# Patient Record
Sex: Female | Born: 1955
Health system: Southern US, Community
[De-identification: ages and names within clinical notes are randomized; demographics above are authoritative.]

## PROBLEM LIST (undated history)

## (undated) DIAGNOSIS — T4145XA Adverse effect of unspecified anesthetic, initial encounter: Secondary | ICD-10-CM

## (undated) DIAGNOSIS — J309 Allergic rhinitis, unspecified: Secondary | ICD-10-CM

## (undated) DIAGNOSIS — F411 Generalized anxiety disorder: Secondary | ICD-10-CM

## (undated) DIAGNOSIS — E785 Hyperlipidemia, unspecified: Secondary | ICD-10-CM

## (undated) DIAGNOSIS — L03119 Cellulitis of unspecified part of limb: Secondary | ICD-10-CM

## (undated) DIAGNOSIS — F429 Obsessive-compulsive disorder, unspecified: Secondary | ICD-10-CM

## (undated) DIAGNOSIS — D499 Neoplasm of unspecified behavior of unspecified site: Secondary | ICD-10-CM

## (undated) DIAGNOSIS — M1990 Unspecified osteoarthritis, unspecified site: Secondary | ICD-10-CM

## (undated) DIAGNOSIS — Z85528 Personal history of other malignant neoplasm of kidney: Secondary | ICD-10-CM

## (undated) DIAGNOSIS — N289 Disorder of kidney and ureter, unspecified: Secondary | ICD-10-CM

## (undated) DIAGNOSIS — F32A Depression, unspecified: Secondary | ICD-10-CM

## (undated) DIAGNOSIS — R079 Chest pain, unspecified: Secondary | ICD-10-CM

## (undated) DIAGNOSIS — F329 Major depressive disorder, single episode, unspecified: Secondary | ICD-10-CM

## (undated) DIAGNOSIS — N926 Irregular menstruation, unspecified: Secondary | ICD-10-CM

## (undated) DIAGNOSIS — M84373A Stress fracture, unspecified ankle, initial encounter for fracture: Secondary | ICD-10-CM

## (undated) DIAGNOSIS — T7840XA Allergy, unspecified, initial encounter: Secondary | ICD-10-CM

## (undated) DIAGNOSIS — I471 Supraventricular tachycardia: Secondary | ICD-10-CM

## (undated) DIAGNOSIS — M79609 Pain in unspecified limb: Secondary | ICD-10-CM

## (undated) DIAGNOSIS — C801 Malignant (primary) neoplasm, unspecified: Secondary | ICD-10-CM

## (undated) DIAGNOSIS — G510 Bell's palsy: Secondary | ICD-10-CM

## (undated) DIAGNOSIS — L02419 Cutaneous abscess of limb, unspecified: Secondary | ICD-10-CM

## (undated) DIAGNOSIS — L84 Corns and callosities: Secondary | ICD-10-CM

## (undated) HISTORY — DX: Allergic rhinitis, unspecified: J30.9

## (undated) HISTORY — DX: Cellulitis of unspecified part of limb: L03.119

## (undated) HISTORY — PX: WISDOM TOOTH EXTRACTION: SHX21

## (undated) HISTORY — DX: Allergy, unspecified, initial encounter: T78.40XA

## (undated) HISTORY — DX: Depression, unspecified: F32.A

## (undated) HISTORY — DX: Bell's palsy: G51.0

## (undated) HISTORY — DX: Major depressive disorder, single episode, unspecified: F32.9

## (undated) HISTORY — DX: Irregular menstruation, unspecified: N92.6

## (undated) HISTORY — DX: Unspecified osteoarthritis, unspecified site: M19.90

## (undated) HISTORY — DX: Generalized anxiety disorder: F41.1

## (undated) HISTORY — DX: Supraventricular tachycardia: I47.1

## (undated) HISTORY — DX: Corns and callosities: L84

## (undated) HISTORY — DX: Obsessive-compulsive disorder, unspecified: F42.9

## (undated) HISTORY — DX: Hyperlipidemia, unspecified: E78.5

## (undated) HISTORY — DX: Cutaneous abscess of limb, unspecified: L02.419

## (undated) HISTORY — DX: Pain in unspecified limb: M79.609

## (undated) HISTORY — DX: Chest pain, unspecified: R07.9

## (undated) HISTORY — PX: APPENDECTOMY: SHX54

---

## 1998-11-03 ENCOUNTER — Other Ambulatory Visit: Admission: RE | Admit: 1998-11-03 | Discharge: 1998-11-03 | Payer: Self-pay | Admitting: Family Medicine

## 2000-03-08 ENCOUNTER — Other Ambulatory Visit: Admission: RE | Admit: 2000-03-08 | Discharge: 2000-03-08 | Payer: Self-pay | Admitting: Family Medicine

## 2001-03-24 ENCOUNTER — Other Ambulatory Visit: Admission: RE | Admit: 2001-03-24 | Discharge: 2001-03-24 | Payer: Self-pay | Admitting: Family Medicine

## 2002-04-13 ENCOUNTER — Other Ambulatory Visit: Admission: RE | Admit: 2002-04-13 | Discharge: 2002-04-13 | Payer: Self-pay | Admitting: Family Medicine

## 2003-04-24 ENCOUNTER — Other Ambulatory Visit: Admission: RE | Admit: 2003-04-24 | Discharge: 2003-04-24 | Payer: Self-pay | Admitting: Family Medicine

## 2003-05-22 ENCOUNTER — Ambulatory Visit (HOSPITAL_COMMUNITY): Admission: RE | Admit: 2003-05-22 | Discharge: 2003-05-22 | Payer: Self-pay | Admitting: Internal Medicine

## 2004-01-20 ENCOUNTER — Ambulatory Visit: Payer: Self-pay | Admitting: Family Medicine

## 2004-04-16 ENCOUNTER — Ambulatory Visit: Payer: Self-pay | Admitting: Family Medicine

## 2004-04-23 ENCOUNTER — Ambulatory Visit: Payer: Self-pay | Admitting: Family Medicine

## 2004-04-23 ENCOUNTER — Other Ambulatory Visit: Admission: RE | Admit: 2004-04-23 | Discharge: 2004-04-23 | Payer: Self-pay | Admitting: Family Medicine

## 2004-09-04 ENCOUNTER — Ambulatory Visit: Payer: Self-pay | Admitting: Family Medicine

## 2005-04-27 ENCOUNTER — Ambulatory Visit: Payer: Self-pay | Admitting: Family Medicine

## 2005-05-10 ENCOUNTER — Ambulatory Visit: Payer: Self-pay | Admitting: Family Medicine

## 2005-05-10 ENCOUNTER — Encounter: Payer: Self-pay | Admitting: Family Medicine

## 2005-05-10 ENCOUNTER — Other Ambulatory Visit: Admission: RE | Admit: 2005-05-10 | Discharge: 2005-05-10 | Payer: Self-pay | Admitting: Family Medicine

## 2005-07-06 ENCOUNTER — Ambulatory Visit: Payer: Self-pay | Admitting: Family Medicine

## 2005-12-14 ENCOUNTER — Ambulatory Visit: Payer: Self-pay | Admitting: Family Medicine

## 2005-12-14 ENCOUNTER — Ambulatory Visit: Payer: Self-pay | Admitting: Cardiology

## 2005-12-14 ENCOUNTER — Observation Stay (HOSPITAL_COMMUNITY): Admission: EM | Admit: 2005-12-14 | Discharge: 2005-12-15 | Payer: Self-pay | Admitting: Emergency Medicine

## 2005-12-27 ENCOUNTER — Encounter: Payer: Self-pay | Admitting: Cardiology

## 2005-12-27 ENCOUNTER — Ambulatory Visit: Payer: Self-pay

## 2006-04-25 ENCOUNTER — Ambulatory Visit: Payer: Self-pay | Admitting: Family Medicine

## 2006-04-25 LAB — CONVERTED CEMR LAB
Albumin: 3.8 g/dL (ref 3.5–5.2)
Alkaline Phosphatase: 35 units/L — ABNORMAL LOW (ref 39–117)
CO2: 30 meq/L (ref 19–32)
Chloride: 109 meq/L (ref 96–112)
Cholesterol: 215 mg/dL (ref 0–200)
Creatinine, Ser: 0.8 mg/dL (ref 0.4–1.2)
GFR calc Af Amer: 98 mL/min
GFR calc non Af Amer: 81 mL/min
Glucose, Bld: 80 mg/dL (ref 70–99)
HCT: 38.8 % (ref 36.0–46.0)
Hemoglobin: 13.9 g/dL (ref 12.0–15.0)
Lymphocytes Relative: 27.2 % (ref 12.0–46.0)
MCHC: 35.9 g/dL (ref 30.0–36.0)
Monocytes Relative: 8 % (ref 3.0–11.0)
Neutrophils Relative %: 61 % (ref 43.0–77.0)
Platelets: 248 10*3/uL (ref 150–400)
Potassium: 4.7 meq/L (ref 3.5–5.1)
RDW: 12.2 % (ref 11.5–14.6)
Sodium: 145 meq/L (ref 135–145)
Total Bilirubin: 0.9 mg/dL (ref 0.3–1.2)
Total Protein: 6.4 g/dL (ref 6.0–8.3)

## 2006-05-31 ENCOUNTER — Encounter: Payer: Self-pay | Admitting: Family Medicine

## 2006-05-31 ENCOUNTER — Other Ambulatory Visit: Admission: RE | Admit: 2006-05-31 | Discharge: 2006-05-31 | Payer: Self-pay | Admitting: Family Medicine

## 2006-05-31 ENCOUNTER — Ambulatory Visit: Payer: Self-pay | Admitting: Family Medicine

## 2007-01-24 ENCOUNTER — Telehealth: Payer: Self-pay | Admitting: Family Medicine

## 2007-02-02 HISTORY — PX: COLONOSCOPY: SHX174

## 2007-03-24 ENCOUNTER — Encounter: Payer: Self-pay | Admitting: Family Medicine

## 2007-03-28 ENCOUNTER — Encounter: Payer: Self-pay | Admitting: Family Medicine

## 2007-04-05 ENCOUNTER — Ambulatory Visit: Payer: Self-pay | Admitting: Family Medicine

## 2007-04-05 DIAGNOSIS — J069 Acute upper respiratory infection, unspecified: Secondary | ICD-10-CM | POA: Insufficient documentation

## 2007-05-26 ENCOUNTER — Ambulatory Visit: Payer: Self-pay | Admitting: Family Medicine

## 2007-05-26 LAB — CONVERTED CEMR LAB
Alkaline Phosphatase: 36 units/L — ABNORMAL LOW (ref 39–117)
Basophils Absolute: 0 10*3/uL (ref 0.0–0.1)
Bilirubin Urine: NEGATIVE
CO2: 31 meq/L (ref 19–32)
Calcium: 9.1 mg/dL (ref 8.4–10.5)
Direct LDL: 124.1 mg/dL
Eosinophils Absolute: 0.2 10*3/uL (ref 0.0–0.7)
GFR calc non Af Amer: 70 mL/min
Glucose, Bld: 76 mg/dL (ref 70–99)
Glucose, Urine, Semiquant: NEGATIVE
HCT: 37.5 % (ref 36.0–46.0)
MCV: 91 fL (ref 78.0–100.0)
Monocytes Absolute: 0.5 10*3/uL (ref 0.1–1.0)
Monocytes Relative: 9.6 % (ref 3.0–12.0)
Neutrophils Relative %: 53.5 % (ref 43.0–77.0)
Protein, U semiquant: NEGATIVE
RBC: 4.12 M/uL (ref 3.87–5.11)
RDW: 12.8 % (ref 11.5–14.6)
Specific Gravity, Urine: 1.015
Total Bilirubin: 1 mg/dL (ref 0.3–1.2)
Total Protein: 6.7 g/dL (ref 6.0–8.3)
Urobilinogen, UA: 0.2
VLDL: 7 mg/dL (ref 0–40)
pH: 7.5

## 2007-06-02 ENCOUNTER — Ambulatory Visit: Payer: Self-pay | Admitting: Family Medicine

## 2007-06-02 ENCOUNTER — Other Ambulatory Visit: Admission: RE | Admit: 2007-06-02 | Discharge: 2007-06-02 | Payer: Self-pay | Admitting: Family Medicine

## 2007-06-02 ENCOUNTER — Encounter: Payer: Self-pay | Admitting: Family Medicine

## 2007-06-02 DIAGNOSIS — F411 Generalized anxiety disorder: Secondary | ICD-10-CM | POA: Insufficient documentation

## 2007-06-02 DIAGNOSIS — I471 Supraventricular tachycardia, unspecified: Secondary | ICD-10-CM

## 2007-06-02 HISTORY — DX: Supraventricular tachycardia, unspecified: I47.10

## 2007-06-02 HISTORY — DX: Generalized anxiety disorder: F41.1

## 2007-06-02 HISTORY — DX: Supraventricular tachycardia: I47.1

## 2007-06-19 ENCOUNTER — Ambulatory Visit: Payer: Self-pay | Admitting: Gastroenterology

## 2007-07-03 ENCOUNTER — Ambulatory Visit: Payer: Self-pay | Admitting: Gastroenterology

## 2007-11-07 ENCOUNTER — Ambulatory Visit: Payer: Self-pay | Admitting: Family Medicine

## 2008-03-25 ENCOUNTER — Encounter: Payer: Self-pay | Admitting: Family Medicine

## 2008-05-27 ENCOUNTER — Telehealth: Payer: Self-pay | Admitting: Family Medicine

## 2008-05-27 ENCOUNTER — Ambulatory Visit: Payer: Self-pay | Admitting: Family Medicine

## 2008-05-27 LAB — CONVERTED CEMR LAB
ALT: 15 units/L (ref 0–35)
AST: 15 units/L (ref 0–37)
BUN: 14 mg/dL (ref 6–23)
Basophils Absolute: 0 10*3/uL (ref 0.0–0.1)
Blood in Urine, dipstick: NEGATIVE
Creatinine, Ser: 0.8 mg/dL (ref 0.4–1.2)
Eosinophils Absolute: 0.4 10*3/uL (ref 0.0–0.7)
GFR calc non Af Amer: 80.09 mL/min (ref >60)
Glucose, Urine, Semiquant: NEGATIVE
HCT: 37.1 % (ref 36.0–46.0)
MCHC: 35.1 g/dL (ref 30.0–36.0)
MCV: 92 fL (ref 78.0–100.0)
Nitrite: NEGATIVE
RBC: 4.03 M/uL (ref 3.87–5.11)
Specific Gravity, Urine: 1.02
TSH: 1.57 microintl units/mL (ref 0.35–5.50)
Total CHOL/HDL Ratio: 3
Total Protein: 6.6 g/dL (ref 6.0–8.3)
Urobilinogen, UA: 0.2
VLDL: 6.2 mg/dL (ref 0.0–40.0)
WBC: 4.7 10*3/uL (ref 4.5–10.5)

## 2008-06-03 ENCOUNTER — Ambulatory Visit: Payer: Self-pay | Admitting: Family Medicine

## 2008-06-03 DIAGNOSIS — N63 Unspecified lump in unspecified breast: Secondary | ICD-10-CM | POA: Insufficient documentation

## 2008-06-03 DIAGNOSIS — N926 Irregular menstruation, unspecified: Secondary | ICD-10-CM

## 2008-06-03 HISTORY — DX: Irregular menstruation, unspecified: N92.6

## 2008-06-10 ENCOUNTER — Encounter: Payer: Self-pay | Admitting: Family Medicine

## 2008-06-11 ENCOUNTER — Telehealth: Payer: Self-pay | Admitting: Family Medicine

## 2008-07-03 ENCOUNTER — Ambulatory Visit: Payer: Self-pay | Admitting: Family Medicine

## 2008-07-03 DIAGNOSIS — J309 Allergic rhinitis, unspecified: Secondary | ICD-10-CM | POA: Insufficient documentation

## 2008-07-03 DIAGNOSIS — L84 Corns and callosities: Secondary | ICD-10-CM

## 2008-07-03 HISTORY — DX: Allergic rhinitis, unspecified: J30.9

## 2008-07-03 HISTORY — DX: Corns and callosities: L84

## 2008-09-14 ENCOUNTER — Ambulatory Visit: Payer: Self-pay | Admitting: Family Medicine

## 2008-09-14 DIAGNOSIS — L02419 Cutaneous abscess of limb, unspecified: Secondary | ICD-10-CM

## 2008-09-14 DIAGNOSIS — L03119 Cellulitis of unspecified part of limb: Secondary | ICD-10-CM

## 2008-09-14 HISTORY — DX: Cutaneous abscess of limb, unspecified: L02.419

## 2008-09-19 ENCOUNTER — Telehealth: Payer: Self-pay | Admitting: *Deleted

## 2008-09-20 ENCOUNTER — Ambulatory Visit: Payer: Self-pay | Admitting: Family Medicine

## 2008-12-02 ENCOUNTER — Ambulatory Visit: Payer: Self-pay | Admitting: Family Medicine

## 2008-12-02 DIAGNOSIS — G510 Bell's palsy: Secondary | ICD-10-CM

## 2008-12-02 DIAGNOSIS — Z8669 Personal history of other diseases of the nervous system and sense organs: Secondary | ICD-10-CM | POA: Insufficient documentation

## 2008-12-02 HISTORY — DX: Bell's palsy: G51.0

## 2009-01-14 ENCOUNTER — Telehealth: Payer: Self-pay | Admitting: Family Medicine

## 2009-04-01 LAB — HM MAMMOGRAPHY: HM Mammogram: NORMAL

## 2009-04-07 ENCOUNTER — Encounter: Payer: Self-pay | Admitting: Family Medicine

## 2009-04-09 ENCOUNTER — Encounter: Payer: Self-pay | Admitting: *Deleted

## 2009-04-29 ENCOUNTER — Ambulatory Visit: Payer: Self-pay | Admitting: Family Medicine

## 2009-04-29 DIAGNOSIS — B009 Herpesviral infection, unspecified: Secondary | ICD-10-CM | POA: Insufficient documentation

## 2009-05-29 ENCOUNTER — Ambulatory Visit: Payer: Self-pay | Admitting: Family Medicine

## 2009-05-29 LAB — CONVERTED CEMR LAB
ALT: 21 units/L (ref 0–35)
AST: 19 units/L (ref 0–37)
Alkaline Phosphatase: 36 units/L — ABNORMAL LOW (ref 39–117)
Basophils Relative: 0.5 % (ref 0.0–3.0)
Blood in Urine, dipstick: NEGATIVE
Chloride: 105 meq/L (ref 96–112)
Cholesterol: 191 mg/dL (ref 0–200)
Eosinophils Absolute: 0.3 10*3/uL (ref 0.0–0.7)
GFR calc non Af Amer: 79.78 mL/min (ref >60)
Lymphocytes Relative: 23.5 % (ref 12.0–46.0)
Lymphs Abs: 1.2 10*3/uL (ref 0.7–4.0)
MCHC: 34.5 g/dL (ref 30.0–36.0)
Neutro Abs: 3.2 10*3/uL (ref 1.4–7.7)
Neutrophils Relative %: 60.5 % (ref 43.0–77.0)
Potassium: 4 meq/L (ref 3.5–5.1)
RBC: 4.15 M/uL (ref 3.87–5.11)
Sodium: 140 meq/L (ref 135–145)
Total CHOL/HDL Ratio: 2
pH: 8.5

## 2009-06-05 ENCOUNTER — Ambulatory Visit: Payer: Self-pay | Admitting: Family Medicine

## 2009-06-05 ENCOUNTER — Other Ambulatory Visit: Admission: RE | Admit: 2009-06-05 | Discharge: 2009-06-05 | Payer: Self-pay | Admitting: Family Medicine

## 2009-06-17 ENCOUNTER — Telehealth: Payer: Self-pay | Admitting: Family Medicine

## 2009-07-07 ENCOUNTER — Ambulatory Visit: Payer: Self-pay | Admitting: Family Medicine

## 2009-07-07 DIAGNOSIS — R079 Chest pain, unspecified: Secondary | ICD-10-CM

## 2009-07-07 HISTORY — DX: Chest pain, unspecified: R07.9

## 2009-09-22 ENCOUNTER — Ambulatory Visit: Payer: Self-pay | Admitting: Family Medicine

## 2009-09-22 DIAGNOSIS — M79609 Pain in unspecified limb: Secondary | ICD-10-CM

## 2009-09-22 DIAGNOSIS — B070 Plantar wart: Secondary | ICD-10-CM | POA: Insufficient documentation

## 2009-09-22 HISTORY — DX: Pain in unspecified limb: M79.609

## 2009-10-29 ENCOUNTER — Ambulatory Visit: Payer: Self-pay | Admitting: Family Medicine

## 2009-12-11 ENCOUNTER — Telehealth: Payer: Self-pay | Admitting: Family Medicine

## 2009-12-15 ENCOUNTER — Ambulatory Visit: Payer: Self-pay | Admitting: Family Medicine

## 2009-12-30 ENCOUNTER — Ambulatory Visit: Payer: Self-pay | Admitting: Family Medicine

## 2010-01-12 ENCOUNTER — Ambulatory Visit: Payer: Self-pay | Admitting: Family Medicine

## 2010-03-03 NOTE — Progress Notes (Signed)
Summary: wart on toe - FYI  Phone Note Call from Patient   Summary of Call: patient is calling because her wart on her toe has not improved.  she has followed the instuctions given.  Follow-up for Phone Call        appointment made for Monday Follow-up by: Kern Reap CMA Duncan Dull),  December 11, 2009 2:47 PM

## 2010-03-03 NOTE — Miscellaneous (Signed)
Summary: mammogram update  Clinical Lists Changes  Observations: Added new observation of MAMMO DUE: 04/2010 (04/09/2009 14:02) Added new observation of MAMMOGRAM: normal (04/07/2009 14:02)      Preventive Care Screening  Mammogram:    Date:  04/07/2009    Next Due:  04/2010    Results:  normal

## 2010-03-03 NOTE — Assessment & Plan Note (Signed)
Summary: flu shot/cjr  Nurse Visit  CC: Flu shot./kb   Allergies: 1)  ! Amoxicillin 2)  ! Latex Exam Gloves (Disposable Gloves)  Orders Added: 1)  Admin 1st Vaccine [90471] 2)  Flu Vaccine 13yrs + [98119]           Flu Vaccine Consent Questions     Do you have a history of severe allergic reactions to this vaccine? no    Any prior history of allergic reactions to egg and/or gelatin? no    Do you have a sensitivity to the preservative Thimersol? no    Do you have a past history of Guillan-Barre Syndrome? no    Do you currently have an acute febrile illness? no    Have you ever had a severe reaction to latex? no    Vaccine information given and explained to patient? yes    Are you currently pregnant? no    Lot Number:[id number]   Exp Date:08/01/2010   Site Given  Left Deltoid IM

## 2010-03-03 NOTE — Miscellaneous (Signed)
Summary: mammogram update  Clinical Lists Changes  Observations: Added new observation of MAMMOGRAM: ultrasound needed (04/07/2009 14:22)      Preventive Care Screening  Mammogram:    Date:  04/07/2009    Results:  ultrasound needed

## 2010-03-03 NOTE — Assessment & Plan Note (Signed)
Summary: pain in finger and toes/cjr   Vital Signs:  Patient profile:   55 year old female Menstrual status:  regular Weight:      132 pounds Temp:     98.3 degrees F oral BP sitting:   120 / 76  (left arm) Cuff size:   regular  Vitals Entered By: Kern Reap CMA Duncan Dull) (September 22, 2009 12:28 PM) CC: right finger, toe pain   CC:  right finger and toe pain.  History of Present Illness: Shelby Grant is [age]-year-old female, who comes in today for evaluation for problems.  She's had discomfort in her right index finger off-and-on she jammed it about a month ago.  She's also had some discomfort in her right great toe and between the fourth and fifth little toes.  She also has a lesion on the ventral surface of the right fourth toe.  The chest wall pain, resolved with time, and Motrin  Allergies: 1)  ! Amoxicillin 2)  ! Latex Exam Gloves (Disposable Gloves)  Past History:  Past medical, surgical, family and social histories (including risk factors) reviewed for relevance to current acute and chronic problems.  Past Medical History: Reviewed history from 06/02/2007 and no changes required. Anxiety Atrial fibrillation  Family History: Reviewed history from 06/02/2007 and no changes required. father alive and well mother died at 63 renal cell carcinoma the brother in good health except he is a smoker  3 sisters, Eunice Blase has asthma, Nexium, andKathy is hypoglycemia, and manic depression Kennyth Arnold has hypoglycemia, and depression  Social History: Reviewed history from 06/02/2007 and no changes required. Occupation: Married Never Smoked Alcohol use-no Drug use-no Regular exercise-yes  Review of Systems      See HPI  Physical Exam  General:  Well-developed,well-nourished,in no acute distress; alert,appropriate and cooperative throughout examination Msk:  No deformity or scoliosis noted of thoracic or lumbar spine.   Skin:  there is a wart on the ventral for surface of the  right fourth toe removed partially with shaving   Impression & Recommendations:  Problem # 1:  PLANTAR WART (JWJ-191.47) Assessment New  Problem # 2:  FOOT PAIN (ICD-729.5) Assessment: New  Complete Medication List: 1)  Claritin 10 Mg Caps (Loratadine) 2)  Wellbutrin 100 Mg Tabs (Bupropion hcl) .... Take one tab two times a day 3)  Klonopin 1 Mg Tabs (Clonazepam) .Marland Kitchen.. 1 tab @ bedtime 4)  Zovirax 400 Mg Tabs (Acyclovir) .... Take 1 tablet by mouth three times a day  until well 5)  Vicodin Es 7.5-750 Mg Tabs (Hydrocodone-acetaminophen) .... 1/2 to 1 q4 h. as needed  Patient Instructions: 1)  apply Duofilm nightly................. shave weekly..........Marland Kitchen return p.r.n.Marland Kitchen 2)  Take 600 mg of Motrin twice daily with food for the soreness in your feet and hands

## 2010-03-03 NOTE — Assessment & Plan Note (Signed)
Summary: wart on toe - rv   Vital Signs:  Patient profile:   55 year old female Menstrual status:  regular Weight:      136 pounds BMI:     21.70 Temp:     98.8 degrees F oral BP sitting:   120 / 78  (left arm) Cuff size:   regular  Vitals Entered By: Shelby Grant) (December 15, 2009 9:38 AM) CC: wart on foot   CC:  wart on foot.  History of Present Illness: Shelby Grant is a 55 year old female, who comes in today for treatment of a wart on the second toe of her right foot.  Allergies: 1)  ! Amoxicillin 2)  ! Latex Exam Gloves (Disposable Gloves)  Physical Exam  General:  Well-developed,well-nourished,in no acute distress; alert,appropriate and cooperative throughout examination Skin:  wart at the second toe, right foot.  The area, was shaved with a sterile blade, and liquid nitrogen   Impression & Recommendations:  Problem # 1:  PLANTAR WART (WJX-914.78) Assessment Deteriorated  Orders: Cryotherapy/Destruction benign or premalignant lesion (1st lesion)  (17000)  Complete Medication List: 1)  Claritin 10 Mg Caps (Loratadine) 2)  Wellbutrin 100 Mg Tabs (Bupropion hcl) .... Take one tab two times a day 3)  Klonopin 1 Mg Tabs (Clonazepam) .Marland Kitchen.. 1 tab @ bedtime 4)  Zovirax 400 Mg Tabs (Acyclovir) .... Take 1 tablet by mouth three times a day  until well 5)  Vicodin Es 7.5-750 Mg Tabs (Hydrocodone-acetaminophen) .... 1/2 to 1 q4 h. as needed  Patient Instructions: 1)  Please schedule a follow-up appointment in 2 weeks.   Orders Added: 1)  Cryotherapy/Destruction benign or premalignant lesion (1st lesion)  [17000]

## 2010-03-03 NOTE — Progress Notes (Signed)
Summary: refill  Phone Note Refill Request Message from:  Fax from Pharmacy on Jun 17, 2009 4:10 PM  Refills Requested: Medication #1:  WELLBUTRIN 100 MG  TABS take one tab two times a day Initial call taken by: Kern Reap CMA Duncan Dull),  Jun 17, 2009 4:10 PM    Prescriptions: WELLBUTRIN 100 MG  TABS (BUPROPION HCL) take one tab two times a day  #200 x 3   Entered by:   Kern Reap CMA (AAMA)   Authorized by:   Roderick Pee MD   Signed by:   Kern Reap CMA (AAMA) on 06/17/2009   Method used:   Electronically to        [facility]* (mail-order)             ,          Ph: [phone]       Fax: [phone]   RxID:   [id number]

## 2010-03-03 NOTE — Assessment & Plan Note (Signed)
Summary: 2 WK ROV/NJR   Vital Signs:  Patient profile:   55 year old female Menstrual status:  regular Weight:      137 pounds Temp:     98.9 degrees F oral BP sitting:   110 / 70  (left arm) Cuff size:   regular  Vitals Entered By: Kern Reap CMA Duncan Dull) (December 30, 2009 9:25 AM) CC: follow-up visit   CC:  follow-up visit.  History of Present Illness: Shelby Grant is a 55 year old female, who comes in today for follow-up after being treated with cryotherapy for wart on her right second toe.  We treated a week ago, and it's turned black.  She comes in today for follow-up  Allergies: 1)  ! Amoxicillin 2)  ! Latex Exam Gloves (Disposable Gloves)  Review of Systems      See HPI  Physical Exam  General:  Well-developed,well-nourished,in no acute distress; alert,appropriate and cooperative throughout examination Skin:  the area was debrided.  The black.  This was related to some bleeding in the soft tissues.  Under that it appears that we have totally cauterized.  The wart.   Impression & Recommendations:  Problem # 1:  PLANTAR WART (EAV-409.81) Assessment Improved  Complete Medication List: 1)  Claritin 10 Mg Caps (Loratadine) 2)  Wellbutrin 100 Mg Tabs (Bupropion hcl) .... Take one tab two times a day 3)  Klonopin 1 Mg Tabs (Clonazepam) .Marland Kitchen.. 1 tab @ bedtime 4)  Zovirax 400 Mg Tabs (Acyclovir) .... Take 1 tablet by mouth three times a day  until well 5)  Vicodin Es 7.5-750 Mg Tabs (Hydrocodone-acetaminophen) .... 1/2 to 1 q4 h. as needed  Patient Instructions: 1)  Please schedule a follow-up appointment as needed.   Orders Added: 1)  Est. Patient Level III [19147]

## 2010-03-03 NOTE — Assessment & Plan Note (Signed)
Summary: SORES ON TONGUE//SLM   Vital Signs:  Patient profile:   55 year old female Menstrual status:  regular Weight:      134 pounds Temp:     98.3 degrees F oral BP sitting:   110 / 76  (left arm) Cuff size:   regular  Vitals Entered By: Kern Reap CMA Duncan Dull) (April 29, 2009 9:06 AM) CC: sores on tongue Is Patient Diabetic? No   CC:  sores on tongue.  History of Present Illness: Shelby Grant is a 55 year old, married female, nonsmoker, who comes in today for evaluation of ulcerated lesions on her tongue.  She states that the day after the [place] Bowl.  She noticed an ulcerated lesion on the left side of her tongue.  Couple days later, she bit it and it bled profusely.  That gradually healed and then she noticed some ulcerations for the back and the posterior part of the left side of the tongue.  Review of systems negative, except she's had recurrent fever blisters, but never in her mouth.they have always  been on her lips.  Allergies: 1)  ! Amoxicillin 2)  ! Latex Exam Gloves (Disposable Gloves)  Past History:  Past medical, surgical, family and social histories (including risk factors) reviewed for relevance to current acute and chronic problems.  Past Medical History: Reviewed history from 06/02/2007 and no changes required. Anxiety Atrial fibrillation  Family History: Reviewed history from 06/02/2007 and no changes required. father alive and well mother died at 52 renal cell carcinoma the brother in good health except he is a smoker  3 sisters, Eunice Blase has asthma, Nexium, andKathy is hypoglycemia, and manic depression Kennyth Arnold has hypoglycemia, and depression  Social History: Reviewed history from 06/02/2007 and no changes required. Occupation: Married Never Smoked Alcohol use-no Drug use-no Regular exercise-yes  Review of Systems      See HPI  Physical Exam  General:  Well-developed,well-nourished,in no acute distress; alert,appropriate and cooperative  throughout examination Mouth:  halfway on the left side of the tongue.  There is an area of scar tissue further back is a 3 to 4 ulcerated type lesions   Impression & Recommendations:  Problem # 1:  COLD SORE (ICD-054.9) Assessment New  Complete Medication List: 1)  Claritin 10 Mg Caps (Loratadine) 2)  Wellbutrin 100 Mg Tabs (Bupropion hcl) .... Take one tab two times a day 3)  Klonopin 1 Mg Tabs (Clonazepam) .Marland Kitchen.. 1 tab @ bedtime 4)  Prednisone 20 Mg Tabs (Prednisone) .... Uad 5)  Zovirax 400 Mg Tabs (Acyclovir) .... Take 1 tablet by mouth three times a day  until well  Patient Instructions: 1)  begin Zovirax 400 mg 3 times a day until the lesions completely heal. Prescriptions: ZOVIRAX 400 MG TABS (ACYCLOVIR) Take 1 tablet by mouth three times a day  until well  #50 x 2   Entered and Authorized by:   Roderick Pee MD   Signed by:   Roderick Pee MD on 04/29/2009   Method used:   Electronically to        [facility]. [phone]* (retail)       [address]       [place], [place]  [address]       Ph: [phone]       Fax: [phone]   RxID:   [id number]

## 2010-03-03 NOTE — Assessment & Plan Note (Signed)
Summary: cpx/pap/njr   Vital Signs:  Patient profile:   55 year old female Menstrual status:  regular Height:      66.5 inches Weight:      132 pounds Temp:     98.3 degrees F oral BP sitting:   110 / 80  (left arm)  Vitals Entered By: Kern Reap CMA Duncan Dull) (Jun 05, 2009 8:48 AM) CC: cpx   CC:  cpx.  History of Present Illness: Shelby Grant is a 55 year old, married female, nonsmoker, who comes in today for wellness examination.  She's always been in the house, which had no chronic health problems.  She takes OTC Claritin for allergic rhinitis p.r.n.  She occasionally takes Clonopin 1 mg nightly for sleep dysfunction.  She takes Wellbutrin 200 mg q.a.m. for mild anxiety.  She also takes an 81-mg baby aspirin.  She gets routine eye care.  Dental care does not do BSE monthly because she has fibrocystic changes.  Mammogram and ultrasound recently run normal.  Colonoscopy two years ago, normal, tetanus, 2010, seasonal flu 2010.  Allergies: 1)  ! Amoxicillin 2)  ! Latex Exam Gloves (Disposable Gloves)  Past History:  Past medical, surgical, family and social histories (including risk factors) reviewed, and no changes noted (except as noted below).  Past Medical History: Reviewed history from 06/02/2007 and no changes required. Anxiety Atrial fibrillation  Family History: Reviewed history from 06/02/2007 and no changes required. father alive and well mother died at 78 renal cell carcinoma the brother in good health except he is a smoker  3 sisters, Eunice Blase has asthma, Nexium, andKathy is hypoglycemia, and manic depression Kennyth Arnold has hypoglycemia, and depression  Social History: Reviewed history from 06/02/2007 and no changes required. Occupation: Married Never Smoked Alcohol use-no Drug use-no Regular exercise-yes  Review of Systems      See HPI  Physical Exam  General:  Well-developed,well-nourished,in no acute distress; alert,appropriate and cooperative  throughout examination Head:  Normocephalic and atraumatic without obvious abnormalities. No apparent alopecia or balding. Eyes:  No corneal or conjunctival inflammation noted. EOMI. Perrla. Funduscopic exam benign, without hemorrhages, exudates or papilledema. Vision grossly normal. Ears:  External ear exam shows no significant lesions or deformities.  Otoscopic examination reveals clear canals, tympanic membranes are intact bilaterally without bulging, retraction, inflammation or discharge. Hearing is grossly normal bilaterally. Nose:  External nasal examination shows no deformity or inflammation. Nasal mucosa are pink and moist without lesions or exudates. Mouth:  Oral mucosa and oropharynx without lesions or exudates.  Teeth in good repair. Neck:  No deformities, masses, or tenderness noted. Chest Wall:  No deformities, masses, or tenderness noted. Breasts:  No mass, nodules, thickening, tenderness, bulging, retraction, inflamation, nipple discharge or skin changes noted.   Lungs:  Normal respiratory effort, chest expands symmetrically. Lungs are clear to auscultation, no crackles or wheezes. Heart:  Normal rate and regular rhythm. S1 and S2 normal without gallop, murmur, click, rub or other extra sounds. Abdomen:  Bowel sounds positive,abdomen soft and non-tender without masses, organomegaly or hernias noted. Rectal:  No external abnormalities noted. Normal sphincter tone. No rectal masses or tenderness. Genitalia:  Pelvic Exam:        External: normal female genitalia without lesions or masses        Vagina: normal without lesions or masses        Cervix: normal without lesions or masses        Adnexa: normal bimanual exam without masses or fullness  Uterus: normal by palpation        Pap smear: performed Msk:  No deformity or scoliosis noted of thoracic or lumbar spine.   Pulses:  R and L carotid,radial,femoral,dorsalis pedis and posterior tibial pulses are full and equal  bilaterally Extremities:  No clubbing, cyanosis, edema, or deformity noted with normal full range of motion of all joints.   Neurologic:  No cranial nerve deficits noted. Station and gait are normal. Plantar reflexes are down-going bilaterally. DTRs are symmetrical throughout. Sensory, motor and coordinative functions appear intact. Skin:  Intact without suspicious lesions or rashes Cervical Nodes:  No lymphadenopathy noted Axillary Nodes:  No palpable lymphadenopathy Inguinal Nodes:  No significant adenopathy Psych:  Cognition and judgment appear intact. Alert and cooperative with normal attention span and concentration. No apparent delusions, illusions, hallucinations   Impression & Recommendations:  Problem # 1:  HEALTH MAINTENANCE EXAM (ICD-V70.0) Assessment Unchanged  Orders: Prescription Created Electronically [phone])  Complete Medication List: 1)  Claritin 10 Mg Caps (Loratadine) 2)  Wellbutrin 100 Mg Tabs (Bupropion hcl) .... Take one tab two times a day 3)  Klonopin 1 Mg Tabs (Clonazepam) .Marland Kitchen.. 1 tab @ bedtime 4)  Prednisone 20 Mg Tabs (Prednisone) .... Uad 5)  Zovirax 400 Mg Tabs (Acyclovir) .... Take 1 tablet by mouth three times a day  until well  Patient Instructions: 1)  Please schedule a follow-up appointment in 1 year. 2)  Schedule your mammogram./bse monthly 3)  Take calcium +Vitamin D daily. 4)  Take an Aspirin every day. Prescriptions: KLONOPIN 1 MG  TABS (CLONAZEPAM) 1 tab @ bedtime  #60 x 4   Entered and Authorized by:   Roderick Pee MD   Signed by:   Roderick Pee MD on 06/05/2009   Method used:   Printed then faxed to ...       [facility]. [phone]* (retail)       [address]       [place], [place]  [address]       Ph: [phone]       Fax: [phone]   RxID:   [id number] WELLBUTRIN 100 MG  TABS (BUPROPION HCL) take one tab two times a day  #200 x 3   Entered and Authorized by:   Roderick Pee MD    Signed by:   Roderick Pee MD on 06/05/2009   Method used:   Electronically to        [facility]. [phone]* (retail)       [address]       [place], [place]  [address]       Ph: [phone]       Fax: [phone]   RxID:   [id number]

## 2010-03-03 NOTE — Assessment & Plan Note (Signed)
Summary: pain under left breast//ccm   Vital Signs:  Patient profile:   55 year old female Menstrual status:  regular BP sitting:   112 / 78  (left arm) Cuff size:   regular  Vitals Entered By: Kern Reap CMA Duncan Dull) (July 07, 2009 12:30 PM) CC: pain under left breast   CC:  pain under left breast.  History of Present Illness: Shelby Grant is a 55 year old, married female, nonsmoker, who comes in today for evaluation of chest pain.  About 3 weeks ago she began having intermittent spells of left-sided chest pain.  She describes it as something that comes and goes.  It may occur once a day.  It may occur two or 3 times per week.  It started suddenly and is very sharp and 8 on a scale of one to 10.  It lasts for about 60 seconds and then goes away it's not related, E., or walking.  No cardiac, no pulmonary, no, GI symptoms.  No history of trauma  Allergies: 1)  ! Amoxicillin 2)  ! Latex Exam Gloves (Disposable Gloves)  Past History:  Past medical, surgical, family and social histories (including risk factors) reviewed for relevance to current acute and chronic problems. Past medical history reviewed for relevance to current acute and chronic problems.  Past Medical History: Reviewed history from 06/02/2007 and no changes required. Anxiety Atrial fibrillation  Family History: Reviewed history from 06/02/2007 and no changes required. father alive and well mother died at 96 renal cell carcinoma the brother in good health except he is a smoker  3 sisters, Eunice Blase has asthma, Nexium, andKathy is hypoglycemia, and manic depression Kennyth Arnold has hypoglycemia, and depression  Social History: Reviewed history from 06/02/2007 and no changes required. Occupation: Married Never Smoked Alcohol use-no Drug use-no Regular exercise-yes  Review of Systems      See HPI  Physical Exam  General:  Well-developed,well-nourished,in no acute distress; alert,appropriate and cooperative  throughout examination Neck:  No deformities, masses, or tenderness noted. Breasts:  No mass, nodules, thickening, tenderness, bulging, retraction, inflamation, nipple discharge or skin changes noted.   Lungs:  Normal respiratory effort, chest expands symmetrically. Lungs are clear to auscultation, no crackles or wheezes. Heart:  Normal rate and regular rhythm. S1 and S2 normal without gallop, murmur, click, rub or other extra sounds.   Problems:  Medical Problems Added: 1)  Dx of Chest Pain  (ICD-786.50)  Impression & Recommendations:  Problem # 1:  CHEST PAIN (ICD-786.50) Assessment New  Complete Medication List: 1)  Claritin 10 Mg Caps (Loratadine) 2)  Wellbutrin 100 Mg Tabs (Bupropion hcl) .... Take one tab two times a day 3)  Klonopin 1 Mg Tabs (Clonazepam) .Marland Kitchen.. 1 tab @ bedtime 4)  Prednisone 20 Mg Tabs (Prednisone) .... Uad 5)  Zovirax 400 Mg Tabs (Acyclovir) .... Take 1 tablet by mouth three times a day  until well 6)  Vicodin Es 7.5-750 Mg Tabs (Hydrocodone-acetaminophen) .... 1/2 to 1 q4 h. as needed  Patient Instructions: 1)  take 600 mg of Motrin twice daily with food.  Also, Vicodin one half to one tablet every 4 to 6 hours as needed for severe pain Prescriptions: VICODIN ES 7.5-750 MG TABS (HYDROCODONE-ACETAMINOPHEN) 1/2 to 1 q4 h. as needed  #30 x 1   Entered and Authorized by:   Roderick Pee MD   Signed by:   Roderick Pee MD on 07/07/2009   Method used:   Print then Give to Patient   RxID:  [id number]  

## 2010-03-05 ENCOUNTER — Encounter: Payer: Self-pay | Admitting: Family Medicine

## 2010-03-05 ENCOUNTER — Ambulatory Visit (INDEPENDENT_AMBULATORY_CARE_PROVIDER_SITE_OTHER): Payer: 59 | Admitting: Family Medicine

## 2010-03-05 VITALS — BP 102/74 | Temp 98.4°F | Ht 66.5 in | Wt 136.0 lb

## 2010-03-05 DIAGNOSIS — N83209 Unspecified ovarian cyst, unspecified side: Secondary | ICD-10-CM

## 2010-03-05 NOTE — Progress Notes (Signed)
  Subjective:    Patient ID: Shelby Grant, female    DOB: 01/08/1956, 55 y.o.   MRN: [id number]  Abdominal Pain This is a new problem. The current episode started in the past 7 days. The onset quality is gradual. The problem occurs constantly. The problem has been unchanged. The pain is located in the RLQ. The pain is at a severity of 7/10. The pain is moderate. The quality of the pain is dull and sharp. The abdominal pain does not radiate. The pain is aggravated by certain positions. The pain is relieved by nothing. She has tried acetaminophen for the symptoms. The treatment provided mild relief.  The onset of the pain was also at the beginning of her period.  Her birth control husband has had a vasectomy.  No previous history of ovarian cysts.    Review of Systems  Gastrointestinal: Positive for abdominal pain.       Objective:   Physical Exam  Constitutional: She appears well-developed and well-nourished.  Abdominal: Soft. Bowel sounds are normal. She exhibits no mass. There is tenderness. There is no rebound and no guarding.  Genitourinary: Vagina normal and uterus normal.       Tender right ovary to palpation          Assessment & Plan:nd   Ruptured right ovarian cyst  Plan Motrin 400 mg t.i.d. And Vicodin one half tab q.4-6 h. P.r.n. For severe pain.  Follow-up in one week

## 2010-03-05 NOTE — Assessment & Plan Note (Signed)
Summary: follow up with toe - rv   History of Present Illness: Shelby Grant returns for treatment of a wart on her right second toe  Allergies: 1)  ! Amoxicillin 2)  ! Latex Exam Gloves (Disposable Gloves)  Physical Exam  General:  Well-developed,well-nourished,in no acute distress; alert,appropriate and cooperative throughout examination Skin:  warts right second toe the area was debrided with a sterile blade, and it was treated with cryo   Impression & Recommendations:  Problem # 1:  PLANTAR WART (AOZ-308.65) Assessment Deteriorated  Orders: Cryotherapy/Destruction benign or premalignant lesion (1st lesion)  (17000)  Complete Medication List: 1)  Claritin 10 Mg Caps (Loratadine) 2)  Wellbutrin 100 Mg Tabs (Bupropion hcl) .... Take one tab two times a day 3)  Klonopin 1 Mg Tabs (Clonazepam) .Marland Kitchen.. 1 tab @ bedtime 4)  Zovirax 400 Mg Tabs (Acyclovir) .... Take 1 tablet by mouth three times a day  until well 5)  Vicodin Es 7.5-750 Mg Tabs (Hydrocodone-acetaminophen) .... 1/2 to 1 q4 h. as needed  Patient Instructions: 1)  Please schedule a follow-up appointment as needed.   Orders Added: 1)  Cryotherapy/Destruction benign or premalignant lesion (1st lesion)  [17000]

## 2010-03-05 NOTE — Patient Instructions (Signed)
Motrin 400 mg 3 times a day, and Vicodin one half to one tablet q.4-6 h. P.r.n. Severe pain.  Return in one week for follow-up, sooner if any problems

## 2010-03-12 ENCOUNTER — Encounter: Payer: Self-pay | Admitting: Family Medicine

## 2010-03-12 ENCOUNTER — Ambulatory Visit (INDEPENDENT_AMBULATORY_CARE_PROVIDER_SITE_OTHER): Payer: 59 | Admitting: Family Medicine

## 2010-03-12 VITALS — Temp 98.7°F | Wt 134.0 lb

## 2010-03-12 DIAGNOSIS — R1031 Right lower quadrant pain: Secondary | ICD-10-CM

## 2010-03-12 NOTE — Progress Notes (Signed)
  Subjective:    Patient ID: Shelby Grant, female    DOB: 02/14/1955, 55 y.o.   MRN: [id number]  HPI Shelby Grant is a 55 year old, married female, nonsmoker, who comes in today for reevaluation of abdominal pain.  We saw her a week or so ago with severe right lower quadrant abdominal pain, which we felt was related to a ruptured ovarian cyst.  The pain has diminished, but not gone away.  Review of systems otherwise negative   Review of Systems     Objective:   Physical Exam    Well-developed well-nourished, female in no acute distress.  Examination the abdomen shows the abdomen is soft.  Bowel sounds are normal.  There is no palpable tenderness, although she points to the right lower quadrant as the source of her pain    Assessment & Plan:  Ruptured ovarian cyst Plan ultrasound to define anatomy since pain has persisted

## 2010-03-12 NOTE — Patient Instructions (Signed)
Once we get to set up for the ultrasound that I will call you the report

## 2010-03-13 ENCOUNTER — Other Ambulatory Visit: Payer: Self-pay | Admitting: Family Medicine

## 2010-03-13 DIAGNOSIS — R102 Pelvic and perineal pain: Secondary | ICD-10-CM

## 2010-03-16 ENCOUNTER — Ambulatory Visit
Admission: RE | Admit: 2010-03-16 | Discharge: 2010-03-16 | Disposition: A | Discharge status: Home / Self Care | Payer: 59 | Source: Ambulatory Visit | Attending: Family Medicine | Admitting: Family Medicine

## 2010-03-16 DIAGNOSIS — R102 Pelvic and perineal pain: Secondary | ICD-10-CM

## 2010-06-02 ENCOUNTER — Other Ambulatory Visit (INDEPENDENT_AMBULATORY_CARE_PROVIDER_SITE_OTHER): Payer: 59 | Admitting: Family Medicine

## 2010-06-02 DIAGNOSIS — Z0000 Encounter for general adult medical examination without abnormal findings: Secondary | ICD-10-CM

## 2010-06-02 DIAGNOSIS — Z13220 Encounter for screening for lipoid disorders: Secondary | ICD-10-CM

## 2010-06-02 DIAGNOSIS — Z79899 Other long term (current) drug therapy: Secondary | ICD-10-CM

## 2010-06-02 LAB — POCT URINALYSIS DIPSTICK
Bilirubin, UA: NEGATIVE
Protein, UA: NEGATIVE
Urobilinogen, UA: 0.2
pH, UA: 5

## 2010-06-02 LAB — CBC WITH DIFFERENTIAL/PLATELET
Eosinophils Absolute: 0.2 10*3/uL (ref 0.0–0.7)
Eosinophils Relative: 3.6 % (ref 0.0–5.0)
HCT: 36.5 % (ref 36.0–46.0)
Hemoglobin: 12.7 g/dL (ref 12.0–15.0)
Lymphs Abs: 1.6 10*3/uL (ref 0.7–4.0)
MCHC: 34.7 g/dL (ref 30.0–36.0)
Monocytes Absolute: 0.4 10*3/uL (ref 0.1–1.0)
Neutrophils Relative %: 61.2 % (ref 43.0–77.0)

## 2010-06-02 LAB — LIPID PANEL
Cholesterol: 220 mg/dL — ABNORMAL HIGH (ref 0–200)
HDL: 96 mg/dL (ref >39.00)
Total CHOL/HDL Ratio: 2
Triglycerides: 41 mg/dL (ref 0.0–149.0)
VLDL: 8.2 mg/dL (ref 0.0–40.0)

## 2010-06-02 LAB — BASIC METABOLIC PANEL
Chloride: 105 mEq/L (ref 96–112)
Creatinine, Ser: 0.7 mg/dL (ref 0.4–1.2)
Glucose, Bld: 88 mg/dL (ref 70–99)
Potassium: 4 mEq/L (ref 3.5–5.1)

## 2010-06-02 LAB — LDL CHOLESTEROL, DIRECT: Direct LDL: 107 mg/dL

## 2010-06-02 LAB — HEPATIC FUNCTION PANEL: Bilirubin, Direct: 0.1 mg/dL (ref 0.0–0.3)

## 2010-06-08 ENCOUNTER — Encounter: Payer: Self-pay | Admitting: Family Medicine

## 2010-06-08 ENCOUNTER — Other Ambulatory Visit (HOSPITAL_COMMUNITY)
Admission: RE | Admit: 2010-06-08 | Discharge: 2010-06-08 | Disposition: A | Discharge status: Home / Self Care | Payer: 59 | Source: Ambulatory Visit | Attending: Family Medicine | Admitting: Family Medicine

## 2010-06-08 ENCOUNTER — Ambulatory Visit (INDEPENDENT_AMBULATORY_CARE_PROVIDER_SITE_OTHER): Payer: 59 | Admitting: Family Medicine

## 2010-06-08 DIAGNOSIS — N841 Polyp of cervix uteri: Secondary | ICD-10-CM | POA: Insufficient documentation

## 2010-06-08 DIAGNOSIS — Z01419 Encounter for gynecological examination (general) (routine) without abnormal findings: Secondary | ICD-10-CM | POA: Insufficient documentation

## 2010-06-08 DIAGNOSIS — N951 Menopausal and female climacteric states: Secondary | ICD-10-CM

## 2010-06-08 DIAGNOSIS — B009 Herpesviral infection, unspecified: Secondary | ICD-10-CM

## 2010-06-08 MED ORDER — ACYCLOVIR 400 MG PO TABS: 400.0000 mg | ORAL_TABLET | ORAL | Status: DC

## 2010-06-08 MED ORDER — CLONAZEPAM 1 MG PO TABS: 1.0000 mg | ORAL_TABLET | Freq: Every day | ORAL | Status: DC

## 2010-06-08 MED ORDER — BUPROPION HCL 100 MG PO TABS: 100.0000 mg | ORAL_TABLET | Freq: Every day | ORAL | Status: DC

## 2010-06-08 NOTE — Progress Notes (Signed)
  Subjective:    Patient ID: Shelby Grant, female    DOB: August 17, 1955, 55 y.o.   MRN: [id number]  HPI  [name] is a delightful, 55 year old, married female, nonsmoker, who comes in today for general physical examination because of perimenopausal symptoms.  She continues at age 65 to have regular periods, however, she does have hot flashes, and some mood changes.  She's taking Wellbutrin 100 mg daily.  She also takes Klonopin 1 mg.  Nightly p.r.n. For sleep.  She also takes acyclovir p.r.n. For outbreak of HSV.  She gets routine eye care, dental care, BSE monthly, mammogram March 2011 normal, colonoscopy, January 2009 normal, tetanus, 2010    Review of Systems  Constitutional: Negative.   HENT: Negative.   Eyes: Negative.   Respiratory: Negative.   Cardiovascular: Negative.   Gastrointestinal: Negative.   Genitourinary: Negative.   Musculoskeletal: Negative.   Neurological: Negative.   Hematological: Negative.   Psychiatric/Behavioral: Negative.        Objective:   Physical Exam  Constitutional: She appears well-developed and well-nourished.  HENT:  Head: Normocephalic and atraumatic.  Right Ear: External ear normal.  Left Ear: External ear normal.  Nose: Nose normal.  Mouth/Throat: Oropharynx is clear and moist.  Eyes: EOM are normal. Pupils are equal, round, and reactive to light.  Neck: Normal range of motion. Neck supple. No thyromegaly present.  Cardiovascular: Normal rate, regular rhythm, normal heart sounds and intact distal pulses.  Exam reveals no gallop and no friction rub.   No murmur heard. Pulmonary/Chest: Effort normal and breath sounds normal.  Abdominal: Soft. Bowel sounds are normal. She exhibits no distension and no mass. There is no tenderness. There is no rebound.  Genitourinary: Vagina normal and uterus normal. Guaiac negative stool. No vaginal discharge found.       Bilateral breast exam normal  Also a 3 mm cervical polyp that bled.  The one we did  her Pap smear  Musculoskeletal: Normal range of motion.  Lymphadenopathy:    She has no cervical adenopathy.  Neurological: She is alert. She has normal reflexes. No cranial nerve deficit. She exhibits normal muscle tone. Coordination normal.  Skin: Skin is warm and dry.  Psychiatric: She has a normal mood and affect. Her behavior is normal. Judgment and thought content normal.          Assessment & Plan:  Healthy female.  Perimenopausal symptoms hot flashes, mood changes,,,,,,,, to continue Wellbutrin 100 mg daily, Klonopin nightly, p.r.n.  History of HSV,,,,,,,, acyclovir p.r.n.

## 2010-06-08 NOTE — Patient Instructions (Signed)
Continue ear, good health habits.  Remember to walk 15 minutes daily.  Return in one year for follow-up, sooner if any problems.  Continue Wellbutrin, one tablet in the morning, and Klonopin at bedtime as needed

## 2010-06-19 NOTE — Discharge Summary (Signed)
Shelby Grant, [place] NO.:  [id number]   MEDICAL RECORD NO.:  [id number]          PATIENT TYPE:  INP   LOCATION:  3702                         FACILITY:  [facility]   PHYSICIAN:  Maple Mirza, PA   DATE OF BIRTH:  05-03-1955   DATE OF ADMISSION:  12/14/2005  DATE OF DISCHARGE:                                 DISCHARGE SUMMARY   ADDENDUM TO DISCHARGE SUMMARY:  This addendum concerns a chest x-ray which  was taken probably on November 13 at 2:15 p.m.  It shows a nodular density  at the right lung base which may represent a nipple shadow.  The patient was  sent down for a repeat PA and lateral chest x-ray with nipple markers in  place and a cardiac leads removed.  The radiologist read the repeat x-ray,  and there is no evidence of right lung base pulmonary nodular density.  The  patient will then be free to discharge December 15, 2005.  These results  have been communicated to the patient, and actually printouts of these x-  rays have been given to her.      Maple Mirza, PA     GM/MEDQ  D:  12/15/2005  T:  12/15/2005  Job:  [id number]   cc:   Tinnie Gens A. Tawanna Cooler, MD  Doylene Canning. Ladona Ridgel, MD

## 2010-06-19 NOTE — H&P (Signed)
Shelby Grant, Shelby Grant NO.:  [id number]   MEDICAL RECORD NO.:  [id number]          PATIENT TYPE:  INP   LOCATION:  3702                         FACILITY:  [facility]   PHYSICIAN:  Luis Abed, MD, FACCDATE OF BIRTH:  [birth year]   DATE OF ADMISSION:  12/14/2005  DATE OF DISCHARGE:  12/15/2005                                HISTORY & PHYSICAL   PRIMARY CARDIOLOGIST:  Dr. Willa Rough   PRIMARY CARE PHYSICIANS:  Dr. Kelle Darting   CHIEF COMPLAINT:  Palpitations.   HISTORY OF PRESENT ILLNESS:  Ms. Tiedt is a very pleasant 55 year old female  patient with no known history of cardiac disease who presented to her  primary care physician's office today with complaints of palpitations.  Her  electrocardiogram was read out as showing AFib with rapid ventricular rate.  She was sent to the emergency room for further evaluation.  Upon arrival via  EMS, she did a Valsalva maneuver.  She broke and is now in normal sinus  rhythm.  She notes palpitations off and on for many years, they probably  occur about one to twice per month.  Today's episode began around 11:30.  It  was the worse episode that she has ever had.  She had no associated chest  pain.  She did note some shortness of breath as well as lightheadedness and  tingling in her extremities.  She had some mild nausea as well.  She now  feels back to normal.   PAST MEDICAL HISTORY:  She denies any history of diabetes, hypertension,  hyperlipidemia, thyroid disease.  She has no history of surgeries.  Denies  any history of stroke.  She does have a history of depression.  She also has  a history of migraine headaches with visual disturbance on the left.   MEDICATIONS:  1. Wellbutrin 150 mg daily.  2. Vitamin C and E, lethicin.  3. Alpha B complex.  4. Garlic.  5. Multivitamin.  6. Claritin p.r.n.   ALLERGIES:  No known drug allergies.   SOCIAL HISTORY:  The patient lives in [place] with her husband.  She  works  in [employer].  She has one stepson.  There is no  history of tobacco or alcohol abuse or drug abuse.   FAMILY HISTORY:  Significant for renal carcinoma in her mother who died in  her 50's, her father is alive.  There is no history of premature coronary  artery disease.   REVIEW OF SYSTEMS:  Please see HPI.  Denies any fevers, chills, cough,  melena, hematochezia, hematuria, dysuria, dysphagia, odynophagia.  She  denies any orthopnea or paroxysmal nocturnal dyspnea.  Denies any edema.  Rest of review of systems is negative.   PHYSICAL EXAMINATION:  GENERAL:  She is a well-nourished well-developed  female in no acute distress.  VITAL SIGNS:  Blood pressure 119/93, pulse 90,  respirations 15.  HEAD:  Normocephalic, atraumatic.  EYES:  PERRLA.  EOMI.  Sclerae clear.  NECK:  Without JVD.  LYMPH:  Without lymphadenopathy.  ENDOCRINE:  Without thyromegaly.  VASCULAR EXAM:  No carotid  bruits noted bilaterally.  Dorsalis pedis and  posterior tibialis pulses are 2+ bilaterally.  CARDIAC:  Normal S1, S2, regular rate and rhythm without murmurs, clicks,  rubs or gallops.  LUNGS:  Clear to auscultation bilaterally without wheezing, rhonchi or  rales.  ABDOMEN:  Soft, nontender with normoactive bowel sounds, no organomegaly.  EXTREMITIES:  Without clubbing, cyanosis or edema.  MUSCULOSKELETAL:  Without joint deformities.  SKIN:  Warm and dry without rashes.  NEUROLOGIC:  She is alert and oriented x3.  Cranial nerves II-XII are  grossly intact.   Chest x-ray is pending.   Laboratory data is pending.   Electrocardiogram number one shows ventricular rate of 183 which is regular  and appears to be supraventricular tachycardia.  The second  electrocardiogram reveals normal sinus rhythm with a heart rate of 93,  nonspecific ST and T wave changes and normal axis.   IMPRESSION:  1. Supraventricular tachycardia - suspect AV nodal re-entry tachycardia.  2. Depression.  3.  History of migraine headaches.   PLAN:  The patient was also interviewed and examined by Dr. Willa Rough.  We plan to admit her for observation, cycle her enzymes and check a TSH.  We  will also ask our electrophysiologist service to see the patient to decide  if electrophysiology studies are appropriate at this point in time.      Tereso Newcomer, PA-C      Luis Abed, MD, [facility]  Electronically Signed    SW/MEDQ  D:  12/14/2005  T:  12/15/2005  Job:  [id number]   cc:   Tinnie Gens A. Tawanna Cooler, MD

## 2010-06-19 NOTE — Discharge Summary (Signed)
Shelby Grant, Shelby Grant               ACCOUNT NO.:  [id number]   MEDICAL RECORD NO.:  [id number]          PATIENT TYPE:  INP   LOCATION:  3702                         FACILITY:  [facility]   PHYSICIAN:  Luis Abed, MD, FACCDATE OF BIRTH:  15-May-1955   DATE OF ADMISSION:  12/14/2005  DATE OF DISCHARGE:  12/15/2005                                 DISCHARGE SUMMARY   THE PATIENT HAS AN ALLERGY TO AMOXICILLIN.   PRINCIPAL DIAGNOSES:  1. Admitted with palpitations/presyncope/hypotension.      a.     Electrocardiogram at primary caregiver, automatic       supraventricular tachycardia, rate 183 beats per minute.      b.     History of recurrent palpitations going back several years      c.     Electrocardiogram shows possible reentry supraventricular       tachycardia.   SECONDARY DIAGNOSIS:  Depression.   No procedures this admission.  The patient converted from a supraventricular  tachycardia to sinus rhythm after a Valsalva maneuver.   BRIEF HISTORY:  Shelby Grant is a 55 year old female.  She presented Dr. Nelida Meuse  office today with palpitations that would not stop.  She felt her heart  impacting, it seems, in different parts of her chest.  Her hands and feet  became numb.  At the time she was driving, she got lightheaded and thought  at one point that she might pass out.   She has had these episodes before.  At one point in the spring of this year,  she remembers a two day stretch where her heart was beating differently.  It was not as racy as the palpitations which brought her to medical  attention on November 13; however, during that two-day period in the spring,  she felt profound fatigue.  Other episodes of palpitations have been  occurring, but they last about a few minutes.  These have been happening for  the last several years.   At Dr. Nelida Meuse office, the electrocardiogram was read as atrial fibrillation.  She was told to bear down, and amazingly to her, her palpitations  stopped.  Once again, her symptoms with this supraventricular tachycardia are  palpitations, hypotension, lightheadedness, numbness in the extremities.  Examination of the electrocardiogram seems to point to a reentry tachycardia  amendable possibly to ablation.   HOSPITAL COURSE:  The patient presented to through the emergency room at  [facility] November 13 after Valsalva maneuver in the doctor's  office.  She maintained sinus rhythm, and she has maintained sinus rhythm  throughout this hospitalization.  She underwent cardiac enzyme studies.  They are all negative for acute coronary syndrome.  Troponin I studies are  0.01, then 0.03, then 0.04.  Dr. Ladona Ridgel has seen Shelby Grant in consultation.  He has taken her history and examined her electrocardiogram.  He has ordered  an outpatient echocardiogram for her and recommends Lopressor as needed for  recurrence of this SVT.  If recurrences become frequent, he recommends not  simply giving 50 mg on the initiation of the dysrhythmia but also  to start  Lopressor 25 mg twice daily.  The patient will follow with Dr. Ladona Ridgel in one  month to consider possible radiofrequency catheter ablation versus continued  medical therapy.  Of note, the patient is a Jehov[employer] Witness.   She takes the following medications at home:  1. Wellbutrin 150 mg daily.  2. Vitamins C and E.  3. Alfalfa.  4. B-complex vitamin.  5. Garlic.  6. Claritin as needed.  7. __________  8. She will go home with a prescription for metoprolol 50-mg tablets to      take 1 at first sign of heart rhythm problems.  If she becomes plagued      with frequent recurrences, Dr. Ladona Ridgel recommends 1/2 tablet twice      daily.  9. She is to continue her Wellbutrin 150 mg daily.  10.Multivitamin 1 tablet daily.   She has follow-up with [facility], [address]:  1. Echocardiogram Monday, November 26 at 1:00 p.m.  2. To see Dr. Ladona Ridgel Monday, December 17, at  12:30.   HER LABORATORY STUDIES THIS ADMISSION:  Complete blood count:  White cells  6.3, hemoglobin 14.7, hematocrit 42.7, platelets 270.  Serum electrolytes:  Sodium 140, potassium 3.6, chloride 105, carbonate 24, BUN is 14, creatinine  0.70, glucose is 84.  Protime was 13.5, INR 1.0.  Alkaline phosphatase 40,  SGOT 22, SGPT 25.  Magnesium was 2.2.  The TSH was 2.5.  Cholesterol was  230.  The total cholesterol was 231, triglycerides 58, HDL cholesterol was  82, LDL cholesterol was 137.      Shelby Grant, [place]      Luis Abed, MD, [facility]  Electronically Signed    GM/MEDQ  D:  12/15/2005  T:  12/15/2005  Job:  [id number]   cc:   Doylene Canning. Ladona Ridgel, MD  Eugenio Hoes Tawanna Cooler, MD

## 2010-06-19 NOTE — Consult Note (Signed)
NAMESTEPHENY, Grant               ACCOUNT NO.:  [id number]   MEDICAL RECORD NO.:  [id number]          PATIENT TYPE:  INP   LOCATION:  3702                         FACILITY:  [facility]   PHYSICIAN:  Shelby Grant. Shelby Ridgel, MD    DATE OF BIRTH:  1955/07/12   DATE OF CONSULTATION:  12/14/2005  DATE OF DISCHARGE:                                   CONSULTATION   REQUESTED BY:  Dr. Jerral Grant.   INDICATION FOR CONSULTATION:  Evaluation of palpitations associated with SVT  and near-syncope.   HISTORY OF PRESENT ILLNESS:  The patient is Grant 55 year old woman who was  admitted to hospital with marked and dramatic ST segment depression  associated with SVT and associated with near-syncope.  She was seen in Dr.  Nelida Grant office today, where her initial EKG was read as Grant fib, but in  actuality, it was irregular narrow short RP tachycardia that was  demonstrable of SVT.  The patient states that the paramedics were called and  she was instructed to have Grant Valsalva maneuver, which promptly terminated  her SVT and restored sinus rhythm.  An EKG during the SVT demonstrated Grant  short RP tachycardia at Grant rate of 185 beats per minute.  There was very  marked and dramatic ST segment depression, which may well Grant rate-related  phenomenon.  She is admitted for additional evaluation of her SVT and her  marked ST segment changes, specifically to rule out for MI.   PAST MEDICAL HISTORY:  Fairly unremarkable.  She does have history of  depression.  There is Grant history of migraine headaches.  There is no history  of thyroid problems, hypertension, diabetes or other cardiac risk factors.   SOCIAL HISTORY:  The patient is married and lives in [place].  She has  her own business.  She denies tobacco or ethanol use.   FAMILY HISTORY:  Notable for mother who died in her 44s of renal cancer and  her father is still living with known coronary disease.   REVIEW OF SYSTEMS:  Negative except for rare palpitations which have  been  present now off and on for several years.  She initially thought these were  due to anxiety.  She denies other cardiac symptoms.  She does have  occasional episodes of nausea.  Otherwise all other systems were reviewed  and found be negative.   MEDICATIONS ON ADMISSION:  Include Wellbutrin, multiple vitamins and  Claritin p.r.n.   PHYSICAL EXAM:  GENERAL:  Notable for Grant pleasant well-appearing 55 year old  woman who looks somewhat younger than her stated age.  VITAL SIGNS:  Blood pressure 119/93, the pulse 90 and regular, respirations  were 16, temperature was 98.  HEENT:  Normocephalic and atraumatic.  Pupils were equal and round.  Oropharynx moist.  Sclerae were anicteric.  NECK:  Revealed no jugular  distention.  There is no thyromegaly.  Trachea is midline.  Carotid are 2+  and symmetric.  LUNGS:  Clear bilaterally to auscultation.  There are no  wheezes, rales or rhonchi.  CARDIOVASCULAR:  Exam revealed Grant regular rate and rhythm with  normal S1 and  S2.  There are no murmurs, rubs or gallops.  The PMI was not enlarged, nor  was it laterally displaced.  ABDOMEN:  Soft, nontender and nondistended.  There was no organomegaly.  The  bowel sounds were present.  There was no rebound or guarding.  EXTREMITIES:  Demonstrate no cyanosis, clubbing or edema.  The pulses were  2+ and symmetric.  NEUROLOGIC:  Alert and oriented x3 with cranial nerves  intact.  The strength was 5/5 and symmetric.   LABORATORY AND ACCESSORY CLINICAL DATA:  EKGs are as previously noted, in  sinus rhythm.  Her 12-lead EKG demonstrates sinus rhythm with no evidence of  any ventricular pre-excitation.   IMPRESSION:  1. Symptomatic supraventricular tachycardia.  2. Near-syncope associated with #1.  3. Dramatic ST-T wave abnormality during supraventricular tachycardia,      resolved in sinus rhythm.   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  This is her first long  episode, although she has had some brief in the past.  I have recommended she be treated with Grant 2-D echo and serial cardiac  enzymes.  Unless these are markedly abnormal, p.r.n. beta blockers would be  the first treatment of choice.  If she is intolerant of these or they are  ineffective, then catheter ablation would be the next option.  I will plan  to see her back as an outpatient in approximately 1 month.      Shelby Grant. Shelby Ridgel, MD  Electronically Signed     Shelby Grant  D:  12/14/2005  T:  12/15/2005  Job:  [id number]   cc:   Shelby Grant. Shelby Cooler, MD

## 2010-07-10 ENCOUNTER — Encounter: Payer: Self-pay | Admitting: Family Medicine

## 2010-10-01 ENCOUNTER — Other Ambulatory Visit: Payer: Self-pay | Admitting: Family Medicine

## 2010-10-08 ENCOUNTER — Telehealth: Payer: Self-pay | Admitting: *Deleted

## 2010-10-08 NOTE — Telephone Encounter (Signed)
patient  Is calling because her blood pressure has been in the range of 130/90.  She has been stressed because she had to take her dad to the doctor.    I advised her to take and record her blood pressure over the weekend.  If the reading remain the same then she should come in for an office visit.  If the readings elevate higher then she should be seen earlier.  Patient agreed.  fyi.

## 2010-10-16 ENCOUNTER — Ambulatory Visit (INDEPENDENT_AMBULATORY_CARE_PROVIDER_SITE_OTHER): Payer: 59 | Admitting: Internal Medicine

## 2010-10-16 ENCOUNTER — Encounter: Payer: Self-pay | Admitting: Internal Medicine

## 2010-10-16 DIAGNOSIS — F411 Generalized anxiety disorder: Secondary | ICD-10-CM

## 2010-10-16 DIAGNOSIS — R079 Chest pain, unspecified: Secondary | ICD-10-CM

## 2010-10-16 NOTE — Patient Instructions (Signed)
Please call the office next week for further evaluation if symptoms fail to improve

## 2010-10-16 NOTE — Telephone Encounter (Signed)
Spoke with patient and she is experiencing some heaviness in her chest, tingling around her mouth, just not feeling "right",  and some swelling.  Appointment made today with Dr Kirtland Bouchard.

## 2010-10-16 NOTE — Telephone Encounter (Signed)
Pt called and is req call back from nurse asap today re: not feeling well. Pt would not give any other info.  Pls call asap today.

## 2010-10-16 NOTE — Progress Notes (Signed)
  Subjective:    Patient ID: Shelby Grant, female    DOB: 01-02-1956, 55 y.o.   MRN: [id number]  HPI  55 year old patient who has a history of a chronic anxiety disorder. This has been managed with bupropion and clonazepam. For the past 2 weeks she is having increasing anxiety easy fatigability. She also has had some blood pressure concerns although no documented high readings. Readings are occasionally in a high normal range when she normally runs in a low-normal range. Situational stressors have included the poor health of her father. She has had history of chest pain in the past she describes a sense of the chest tightness associated with anxiety. She does exercise regularly and has no exertional chest pain component  BP Readings from Last 3 Encounters:  10/16/10 114/80  06/08/10 110/80  03/05/10 102/74    Review of Systems  Constitutional: Positive for fatigue.  HENT: Negative for hearing loss, congestion, sore throat, rhinorrhea, dental problem, sinus pressure and tinnitus.   Eyes: Negative for pain, discharge and visual disturbance.  Respiratory: Negative for cough and shortness of breath.   Cardiovascular: Negative for chest pain, palpitations and leg swelling.  Gastrointestinal: Negative for nausea, vomiting, abdominal pain, diarrhea, constipation, blood in stool and abdominal distention.  Genitourinary: Negative for dysuria, urgency, frequency, hematuria, flank pain, vaginal bleeding, vaginal discharge, difficulty urinating, vaginal pain and pelvic pain.  Musculoskeletal: Negative for joint swelling, arthralgias and gait problem.  Skin: Negative for rash.  Neurological: Negative for dizziness, syncope, speech difficulty, weakness, numbness and headaches.  Hematological: Negative for adenopathy.  Psychiatric/Behavioral: Negative for behavioral problems, dysphoric mood and agitation. The patient is nervous/anxious.        Objective:   Physical Exam  Constitutional: She is  oriented to person, place, and time. She appears well-developed and well-nourished.  HENT:  Head: Normocephalic.  Right Ear: External ear normal.  Left Ear: External ear normal.  Mouth/Throat: Oropharynx is clear and moist.  Eyes: Conjunctivae and EOM are normal. Pupils are equal, round, and reactive to light.  Neck: Normal range of motion. Neck supple. No thyromegaly present.  Cardiovascular: Normal rate, regular rhythm, normal heart sounds and intact distal pulses.   Pulmonary/Chest: Effort normal and breath sounds normal.  Abdominal: Soft. Bowel sounds are normal. She exhibits no mass. There is no tenderness.  Musculoskeletal: Normal range of motion.  Lymphadenopathy:    She has no cervical adenopathy.  Neurological: She is alert and oriented to person, place, and time.  Skin: Skin is warm and dry. No rash noted.  Psychiatric: She has a normal mood and affect. Her behavior is normal.          Assessment & Plan:   Anxiety disorder. Presently patient is taking 0.25 mg of Klonopin at bedtime only she'll up titrate slightly. She'll continue on  bupropion. If symptoms fail to improve has been asked to call the office next week for further assessment

## 2010-12-22 ENCOUNTER — Encounter: Payer: Self-pay | Admitting: Family Medicine

## 2010-12-22 ENCOUNTER — Ambulatory Visit (INDEPENDENT_AMBULATORY_CARE_PROVIDER_SITE_OTHER): Payer: 59 | Admitting: Family Medicine

## 2010-12-22 ENCOUNTER — Telehealth: Payer: Self-pay | Admitting: Family Medicine

## 2010-12-22 VITALS — BP 120/80 | Temp 100.1°F | Wt 136.0 lb

## 2010-12-22 DIAGNOSIS — N3090 Cystitis, unspecified without hematuria: Secondary | ICD-10-CM

## 2010-12-22 DIAGNOSIS — N3091 Cystitis, unspecified with hematuria: Secondary | ICD-10-CM | POA: Insufficient documentation

## 2010-12-22 DIAGNOSIS — N390 Urinary tract infection, site not specified: Secondary | ICD-10-CM

## 2010-12-22 LAB — POCT URINALYSIS DIPSTICK
Nitrite, UA: POSITIVE
pH, UA: 8.5

## 2010-12-22 MED ORDER — SULFAMETHOXAZOLE-TRIMETHOPRIM 800-160 MG PO TABS: ORAL_TABLET | ORAL | Status: AC

## 2010-12-22 MED ORDER — SULFAMETHOXAZOLE-TRIMETHOPRIM 800-160 MG PO TABS: ORAL_TABLET | ORAL | Status: DC

## 2010-12-22 NOTE — Progress Notes (Signed)
  Subjective:    Patient ID: Shelby Grant, female    DOB: [birth year], 55 y.o.   MRN: [id number]  Mccravy is a 55 year old, married female, nonsmoker, who comes in today for evaluation of acute hemorrhagic cystitis.  Her symptoms started today.  No fever, chills, or back pain.  No history of previous UTIs    Review of Systems    General and neurologic review of systems otherwise negative Objective:   Physical Exam  Well-developed well-nourished, female, in no acute distress.  Examination the abdomen is negative.  Examination of the urethra is normal      Assessment & Plan:  Hemorrhagic cystitis.  Plan Septra b.i.d. Return p.r.n.

## 2010-12-22 NOTE — Patient Instructions (Signed)
Drink lots of water.  Pyridium   1............3 times daily for burning  Septra, one twice daily to bottle empty.  Vicodin one half tab q.4 h. P.r.n. Severe pain

## 2010-12-22 NOTE — Telephone Encounter (Signed)
Pt called and is at [facility]. Pt was told by pharmacist that Pyridium is not otc. Pharmacist recommend Phenazopyridine as alternative. Pt said that she is getting this med to try.

## 2010-12-29 ENCOUNTER — Telehealth: Payer: Self-pay | Admitting: *Deleted

## 2010-12-29 NOTE — Telephone Encounter (Signed)
Spoke with patient and she will call back as needed.

## 2010-12-29 NOTE — Telephone Encounter (Signed)
Agree with advice.  7 days should be adequate.

## 2010-12-29 NOTE — Telephone Encounter (Signed)
Patient is calling because she has been taking Septra for 7 days.  Now she has NVD.  I suggested she stop the Septra and take some imodium for the diarrhea.  Patient agreed.  Any suggestions?

## 2011-01-20 ENCOUNTER — Encounter (HOSPITAL_COMMUNITY): Payer: Self-pay

## 2011-01-20 ENCOUNTER — Emergency Department (HOSPITAL_COMMUNITY)
Admission: EM | Admit: 2011-01-20 | Discharge: 2011-01-20 | Disposition: A | Discharge status: Home / Self Care | Payer: 59 | Attending: Emergency Medicine | Admitting: Emergency Medicine

## 2011-01-20 ENCOUNTER — Emergency Department (HOSPITAL_COMMUNITY): Payer: 59

## 2011-01-20 ENCOUNTER — Other Ambulatory Visit: Payer: Self-pay

## 2011-01-20 DIAGNOSIS — I4891 Unspecified atrial fibrillation: Secondary | ICD-10-CM | POA: Insufficient documentation

## 2011-01-20 LAB — BASIC METABOLIC PANEL
Calcium: 9.1 mg/dL (ref 8.4–10.5)
GFR calc Af Amer: 90 mL/min — ABNORMAL LOW (ref >90)
GFR calc non Af Amer: 77 mL/min — ABNORMAL LOW (ref >90)
Glucose, Bld: 97 mg/dL (ref 70–99)
Potassium: 3.8 mEq/L (ref 3.5–5.1)
Sodium: 143 mEq/L (ref 135–145)

## 2011-01-20 LAB — CBC
Hemoglobin: 13.3 g/dL (ref 12.0–15.0)
MCH: 30.6 pg (ref 26.0–34.0)
MCHC: 34.1 g/dL (ref 30.0–36.0)
Platelets: 252 10*3/uL (ref 150–400)
RDW: 12.5 % (ref 11.5–15.5)

## 2011-01-20 MED ORDER — METOPROLOL TARTRATE 100 MG PO TABS: 100.0000 mg | ORAL_TABLET | Freq: Two times a day (BID) | ORAL | Status: DC

## 2011-01-20 MED ORDER — METOPROLOL TARTRATE 25 MG PO TABS: ORAL_TABLET | ORAL | Status: DC

## 2011-01-20 MED ORDER — METOPROLOL TARTRATE 1 MG/ML IV SOLN
INTRAVENOUS | Status: AC
  Filled 2011-01-20: qty 5

## 2011-01-20 NOTE — ED Notes (Signed)
Returned from  X-ray

## 2011-01-20 NOTE — ED Provider Notes (Signed)
History     CSN: [id number] Arrival date & time: 01/20/2011  2:45 PM   First MD Initiated Contact with Patient 01/20/11 1503      Chief Complaint  Patient presents with  . Atrial Fibrillation    (Consider location/radiation/quality/duration/timing/severity/associated sxs/prior treatment) HPI Comments: Patient presents with chief complaint of heart palpitations and becoming diaphoretic.  She denies any DOE, shortness of breath, chest pain, loss of consciousness, unilateral or bilateral weakness, HA, recent illness,  change in vision or other complaints.  Patient states she has had atrial fibrillation since 2007.  She was evaluated at that time with a TSH, cardiac enzymes, and 2-D echo that were all within normal limits.  She was given Metroprolol when necessary by [facility].  This year she states she has had 2-3 episodes of A. fib that have been able to be controlled by her Metroprolol.  However, today it did not work and she called 911.  In route the patient was in A. fib with a heart rate of 160.  They gave her Lopressor IV and converted the patient.  She has no current complaints.   Patient is a 55 y.o. female presenting with atrial fibrillation. The history is provided by the patient.  Atrial Fibrillation This is a chronic problem. The current episode started today. The problem occurs rarely. The problem has been resolved. Pertinent negatives include no abdominal pain, anorexia, arthralgias, change in bowel habit, chest pain, chills, congestion, coughing, diaphoresis, fatigue, fever, headaches, joint swelling, myalgias, nausea, neck pain, numbness, rash, sore throat, swollen glands, urinary symptoms, vertigo, visual change, vomiting or weakness. The symptoms are aggravated by nothing. Treatments tried: BB metropolol  The treatment provided mild relief.    Past Medical History  Diagnosis Date  . Plantar wart 09/22/2009  . ANXIETY 06/02/2007  . BELL'S PALSY, LEFT 12/02/2008  .  Atrial fibrillation 06/02/2007  . ALLERGIC RHINITIS 07/03/2008  . BREAST MASS, RIGHT 06/03/2008  . IRREGULAR MENSES 06/03/2008  . CELLULITIS, LEFT LEG 09/14/2008  . CORNS AND CALLUSES 07/03/2008  . FOOT PAIN 09/22/2009  . CHEST PAIN 07/07/2009    History reviewed. No pertinent past surgical history.  No family history on file.  History  Substance Use Topics  . Smoking status: Never Smoker   . Smokeless tobacco: Not on file  . Alcohol Use: No    OB History    Grav Para Term Preterm Abortions TAB SAB Ect Mult Living                  Review of Systems  Constitutional: Negative for fever, chills, diaphoresis and fatigue.  HENT: Negative for congestion, sore throat and neck pain.   Respiratory: Negative for cough.   Cardiovascular: Negative for chest pain.  Gastrointestinal: Negative for nausea, vomiting, abdominal pain, anorexia and change in bowel habit.  Musculoskeletal: Negative for myalgias, joint swelling and arthralgias.  Skin: Negative for rash.  Neurological: Negative for dizziness, vertigo, facial asymmetry, weakness, numbness and headaches.    Allergies  Amoxicillin and Latex  Home Medications   Current Outpatient Rx  Name Route Sig Dispense Refill  . ACYCLOVIR 400 MG PO TABS Oral Take 1 tablet (400 mg total) by mouth every 4 (four) hours while awake. 50 tablet 4  . BUPROPION HCL 100 MG PO TABS  TAKE 1 TABLET TWICE A DAY 200 tablet 2  . CLONAZEPAM 1 MG PO TABS Oral Take 1 tablet (1 mg total) by mouth daily. 50 tablet 3  . LORATADINE 10  MG PO TABS Oral Take 10 mg by mouth daily.        BP 116/83  Pulse 75  Temp(Src) 98 F (36.7 C) (Oral)  Resp 10  Ht 5\' 7"  (1.702 m)  Wt 133 lb (60.328 kg)  BMI 20.83 kg/m2  SpO2 96%  LMP 12/21/2010  Physical Exam  Nursing note and vitals reviewed. Constitutional: She is oriented to person, place, and time. She appears well-developed and well-nourished. No distress.  HENT:  Head: Normocephalic and atraumatic.  Eyes: EOM are  normal. Pupils are equal, round, and reactive to light.       Normal appearance  Neck: Normal range of motion.  Cardiovascular: Normal rate, regular rhythm and normal heart sounds.   Pulmonary/Chest: Effort normal and breath sounds normal. No respiratory distress. She exhibits no tenderness.  Abdominal: Soft. Bowel sounds are normal. She exhibits no distension. There is no tenderness. There is no guarding.  Musculoskeletal: Normal range of motion. She exhibits no edema and no tenderness.       2+ Dorsalis Pedis pulses  Neurological: She is alert and oriented to person, place, and time. No cranial nerve deficit. Coordination normal.  Skin: Skin is warm and dry. No rash noted. She is not diaphoretic.  Psychiatric: She has a normal mood and affect. Her behavior is normal.    ED Course  Procedures   Date: 01/20/2011  Rate: 79  Rhythm: normal sinus rhythm  QRS Axis: normal  Intervals: normal  ST/T Wave abnormalities: normal  Conduction Disutrbances:none  Narrative Interpretation:   Old EKG Reviewed: unchanged    . Labs Reviewed  BASIC METABOLIC PANEL - Abnormal; Notable for the following:    GFR calc non Af Amer 77 (*)    GFR calc Af Amer 90 (*)    All other components within normal limits  CBC   No results found.   No diagnosis found.  4:48 PM Patient reevaluated.  Still hemodynamically stable, in no acute distress, in normal sinus rhythm, and no current complaints.  Patient states she will followup with cardiology upon discharge.  5:46 PM  Spoke w radiology on pending x-ray; CXR: WNL    MDM  Atrial Fibrillation         [place], [place] 01/20/11 1746

## 2011-01-20 NOTE — ED Notes (Addendum)
Pt states sob,tingling,dizziness and weakness that started at 1325 today. Felt like she was going to pass out. Called ems.pt hr 180 ,was given lopressor 5 mg  ivp pta.

## 2011-01-20 NOTE — ED Notes (Signed)
Informed patient and/or family of status.  

## 2011-01-21 ENCOUNTER — Encounter: Payer: Self-pay | Admitting: Cardiology

## 2011-01-21 ENCOUNTER — Ambulatory Visit (INDEPENDENT_AMBULATORY_CARE_PROVIDER_SITE_OTHER): Payer: 59 | Admitting: Cardiology

## 2011-01-21 ENCOUNTER — Ambulatory Visit: Payer: 59 | Admitting: Cardiology

## 2011-01-21 ENCOUNTER — Telehealth: Payer: Self-pay | Admitting: Internal Medicine

## 2011-01-21 VITALS — BP 118/72 | HR 74 | Ht 66.75 in | Wt 134.0 lb

## 2011-01-21 DIAGNOSIS — I498 Other specified cardiac arrhythmias: Secondary | ICD-10-CM

## 2011-01-21 DIAGNOSIS — I471 Supraventricular tachycardia: Secondary | ICD-10-CM

## 2011-01-21 NOTE — Patient Instructions (Signed)
We will schedule you for an Echocardiogram.  Avoid caffeine and decongestants.  We will try and get a copy of your Ecg from EMS  I will schedule you a follow up with Dr. Graciela Husbands.

## 2011-01-21 NOTE — Telephone Encounter (Addendum)
New msg She said she went to emergency room yesterday for afib and she wants to see Dr Graciela Husbands today Please call her back, pt called back to add that she has an appt today she can't miss at 12p, so she would be unavailable from 1145a to about 130p

## 2011-01-21 NOTE — Telephone Encounter (Signed)
I spoke with the patient. She was seen in the [facility] ER yesterday for a-fib with RVR. She was at home and felt pre-syncopal with tingling in her extremities and heart palpitations. Per the ER note, the patient was in a-fib en route with a HR of 160 bpm. She was seen in the ER and given IV meds. She was discharged from the ER with instructions to follow up with cardiology. I do not see that we have seen the patient since 2007. She was hospitalized at that time with SVT and consulted on by Dr. Ladona Ridgel. She had an outpatient echo at that time which was normal. She cancelled follow up with Dr. Ladona Ridgel at that time. Today she does not feel well. She is a little dizzy and very tired. She is requesting an appointment with one of our providers today. I have reviewed this with Dr. [place]and (DOD) and he will see the patient today. The patient is aware and agreeable. She will follow up at 2:30pm today.

## 2011-01-21 NOTE — Telephone Encounter (Signed)
Fu call Pt can be reached at [phone] now

## 2011-01-22 ENCOUNTER — Ambulatory Visit (HOSPITAL_COMMUNITY): Payer: 59 | Attending: Cardiovascular Disease | Admitting: Radiology

## 2011-01-22 ENCOUNTER — Telehealth: Payer: Self-pay | Admitting: *Deleted

## 2011-01-22 DIAGNOSIS — I471 Supraventricular tachycardia, unspecified: Secondary | ICD-10-CM | POA: Insufficient documentation

## 2011-01-22 NOTE — Progress Notes (Signed)
Shelby Grant Date of Birth: 1955/11/24 Medical Record #[id number]  History of Present Illness: Shelby Grant is seen today at the request of the emergency department for evaluation of tachycardia. She is a 55 year old white female who in general has been in good health. She presented in November of 2007 with an episode of supraventricular tachycardia with a rate of 183 beats per minute. Her EKG was initially interpreted as atrial fibrillation but on further analysis of her ECG it was felt that this was a supraventricular tachycardia with a short RP interval. She was given metoprolol to use as needed. Echocardiogram at that time was normal. Since that time she has had infrequent episodes of tachycardia. She can remember 3 episodes of this past year. Yesterday she had a more severe episode than usual and she became very clammy and developed tingling in her hands. She felt like she might black out. Patient reports that EMS told her heart rate was 180-200 beats per minute. She was administered IV Lopressor in the ambulance and converted to sinus rhythm. I have no documentation of her initial rhythm. In the emergency room her ECG was normal. Today she complains that she still feels very wiped out but has had no recurrent tachycardia.  Current Outpatient Prescriptions on File Prior to Visit  Medication Sig Dispense Refill  . buPROPion (WELLBUTRIN) 100 MG tablet TAKE 1 TABLET TWICE A DAY  200 tablet  2  . loratadine (CLARITIN) 10 MG tablet Take 10 mg by mouth as needed.       . metoprolol tartrate (LOPRESSOR) 25 MG tablet Take 1 tablet PO prn for chest palpitations  10 tablet  0    Allergies  Allergen Reactions  . Amoxicillin   . Latex     REACTION: rash    Past Medical History  Diagnosis Date  . Plantar wart 09/22/2009  . ANXIETY 06/02/2007  . BELL'S PALSY, LEFT 12/02/2008  . Atrial fibrillation 06/02/2007  . ALLERGIC RHINITIS 07/03/2008  . BREAST MASS, RIGHT 06/03/2008  . IRREGULAR MENSES 06/03/2008  .  CELLULITIS, LEFT LEG 09/14/2008  . CORNS AND CALLUSES 07/03/2008  . FOOT PAIN 09/22/2009  . CHEST PAIN 07/07/2009  . SVT (supraventricular tachycardia)     Past Surgical History  Procedure Date  . Wisdom tooth extraction     History  Smoking status  . Never Smoker   Smokeless tobacco  . Not on file    History  Alcohol Use No    Family History  Problem Relation Age of Onset  . Asthma Sister     Review of Systems: The review of systems is positive for modest caffeine intake.  She has had no recent change in her medications or lifestyle. Her sister describes her as a somewhat hyperactive person. All other systems were reviewed and are negative.  Physical Exam: BP 118/72  Pulse 74  Ht 5' 6.75" (1.695 m)  Wt 134 lb (60.782 kg)  BMI 21.14 kg/m2  LMP 12/21/2010 She is a pleasant white female in no acute distress.The patient is alert and oriented x 3.  The mood and affect are normal.  The skin is warm and dry.  Color is normal.  The HEENT exam reveals that the sclera are nonicteric.  The mucous membranes are moist.  The carotids are 2+ without bruits.  There is no thyromegaly.  There is no JVD.  The lungs are clear.  The chest wall is non tender.  The heart exam reveals a regular rate with a  normal S1 and S2.  There are no murmurs, gallops, or rubs.  The PMI is not displaced.   Abdominal exam reveals good bowel sounds.  There is no guarding or rebound.  There is no hepatosplenomegaly or tenderness.  There are no masses.  Exam of the legs reveal no clubbing, cyanosis, or edema.  The legs are without rashes.  The distal pulses are intact.  Cranial nerves II - XII are intact.  Motor and sensory functions are intact.  The gait is normal.  LABORATORY DATA: ECG was reviewed from the emergency department and was normal. CBC and basic metabolic panel were normal. She had complete lab work this past May including thyroid function studies which were normal.  Assessment / Plan:

## 2011-01-22 NOTE — Telephone Encounter (Signed)
Walk-In Patient, she would like to know if the office has received the EKG that was done by paramedics on Wednesday  when she was being taken to the ER. Patient said the paramedics gave the EKG to a nurse in the ER.,  Patient states  Dr. Sw[place] need to see it. According to our records the EKG was not scanned at this time,  possible it will be by next week. Patient would like to know about it  ASAP. [place] and ER phone number given to pt per her  request.

## 2011-01-22 NOTE — Assessment & Plan Note (Signed)
The mechanism of her arrhythmia is unclear. We will attempt to get documentation of her presenting rhythm from EMS. The emergency room notes say she had atrial fibrillation but I am not convinced. Her response to IV Lopressor would suggest a possible reentrant mechanism. This would be consistent with her prior presentation in 2007. She still has fairly infrequent episodes of tachycardia. I recommended continued  use of when necessary metoprolol. I recommended avoidance of caffeine or other stimulants. We will update her echocardiogram. She expresses interest in seeing Dr. Graciela Husbands as he has also cared for her father. We will make those arrangements.

## 2011-01-25 ENCOUNTER — Telehealth: Payer: Self-pay | Admitting: Family Medicine

## 2011-01-25 ENCOUNTER — Other Ambulatory Visit (HOSPITAL_COMMUNITY): Payer: 59 | Admitting: Radiology

## 2011-01-25 MED ORDER — CLONAZEPAM 1 MG PO TABS: 0.2500 mg | ORAL_TABLET | Freq: Every day | ORAL | Status: DC

## 2011-01-25 NOTE — Telephone Encounter (Signed)
Pt requesting refill on clonazePAM (KLONOPIN) 1 MG tablet   [facility] [place]

## 2011-01-27 NOTE — ED Provider Notes (Signed)
Medical screening examination/treatment/procedure(s) were conducted as a shared visit with non-physician practitioner(s) and myself.  I personally evaluated the patient during the encounter   Jameal Razzano E Bilbo Carcamo, MD 01/27/11 0748 

## 2011-01-28 ENCOUNTER — Telehealth: Payer: Self-pay | Admitting: Cardiology

## 2011-01-28 NOTE — Telephone Encounter (Signed)
New problem:  Discuss EKG from EMS.

## 2011-01-28 NOTE — Telephone Encounter (Signed)
Have called ER to find EKG tracing that was done on 01/20/11. Has not been scanned in Epic. Heather called Medical records and they can't find tracing from that day that was done by EMS. Spoke w/pt and she said the EMS can fax tracing to [place]. Gave her the fax number.

## 2011-01-28 NOTE — Telephone Encounter (Signed)
See 01/28/11 phone note.

## 2011-02-03 NOTE — Telephone Encounter (Signed)
Patient called she will bring copies of EMS monitor strips to her appointment with Dr.Klein 02/08/11.

## 2011-02-03 NOTE — Telephone Encounter (Signed)
Fu call Pt has ekg from ems please call her back

## 2011-02-08 ENCOUNTER — Ambulatory Visit (INDEPENDENT_AMBULATORY_CARE_PROVIDER_SITE_OTHER): Payer: 59 | Admitting: Internal Medicine

## 2011-02-08 ENCOUNTER — Encounter: Payer: Self-pay | Admitting: Internal Medicine

## 2011-02-08 DIAGNOSIS — I498 Other specified cardiac arrhythmias: Secondary | ICD-10-CM

## 2011-02-08 DIAGNOSIS — F411 Generalized anxiety disorder: Secondary | ICD-10-CM

## 2011-02-08 DIAGNOSIS — I471 Supraventricular tachycardia: Secondary | ICD-10-CM

## 2011-02-08 MED ORDER — METOPROLOL SUCCINATE ER 25 MG PO TB24: 25.0000 mg | ORAL_TABLET | Freq: Every day | ORAL | Status: DC

## 2011-02-08 NOTE — Progress Notes (Signed)
CARDIOLOGY CONSULT NOTE  Patient ID: Shelby Grant, MRN: [id number], DOB/AGE: 1955-12-21 56 y.o. Admit date: (Not on file) Date of Consult: 02/08/2011  Primary Physician: Evette Georges, MD Primary Cardiologist:   Chief Complaint: tachycardia   HPI: [name] is seen at her own request because of recurrent tachycardia.  She was originally seen by our service in 2007 which she was sent from Dr. Nelida Meuse office to assess Bruce. Dr. Ladona Ridgel looked at her tracings which were originally described as atrial fibrillation but he identified P waves and irregularity suggested to him of AV nodal reentry.  She's continued to have abrupt onset onset tachypalpitations which sometimes respond at all sound. There mostly irregular. They're associated with lightheadedness and diaphoresis shortness of breath and chest discomfort.  Mostly she is able to wait for them. In December she had an episode that because of profound symptoms and persistence she called EMS. She is able to pick up those tracings and bring them with her today. They are very illuminating (see below).  Since that time she has had episodes of abrupt onset of weakness and dizziness. She had actually 2 of these episodes while in the office. Her blood pressure and heart rate were normal with these episodes.  She has significant residual fatigue and diaphoresis  She has a diagnosis of anxiety for which she takes Wellbutrin.  Cardiac evaluation in the interval has included an ultrasound that was normal.     Past Medical History  Diagnosis Date  . Plantar wart 09/22/2009  . ANXIETY 06/02/2007  . BELL'S PALSY, LEFT 12/02/2008  . Atrial fibrillation 06/02/2007  . ALLERGIC RHINITIS 07/03/2008  . BREAST MASS, RIGHT 06/03/2008  . IRREGULAR MENSES 06/03/2008  . CELLULITIS, LEFT LEG 09/14/2008  . CORNS AND CALLUSES 07/03/2008  . FOOT PAIN 09/22/2009  . CHEST PAIN 07/07/2009  . SVT (supraventricular tachycardia)       Surgical History:  Past  Surgical History  Procedure Date  . Wisdom tooth extraction      Home Meds: Prior to Admission medications   Medication Sig Start Date End Date Taking? Authorizing Provider  Ascorbic Acid (VITAMIN C PO) Take 2 tablets by mouth daily.     Yes Historical Provider, MD  aspirin 81 MG tablet Take 81 mg by mouth daily.     Yes Historical Provider, MD  B Complex Vitamins (VITAMIN B COMPLEX PO) Take 2 tablets by mouth daily.     Yes Historical Provider, MD  buPROPion (WELLBUTRIN) 100 MG tablet TAKE 1 TABLET TWICE A DAY 10/01/10  Yes Gaetano Net Todd  Calcium-Magnesium-Vitamin D (CALCIUM MAGNESIUM PO) Take 4 tablets by mouth daily.     Yes Historical Provider, MD  clonazePAM (KLONOPIN) 1 MG tablet Take 0.5 tablets (0.5 mg total) by mouth daily. 01/25/11  Yes Gaetano Net Todd  GARLIC PO Take by mouth daily.     Yes Historical Provider, MD  glucosamine-chondroitin 500-400 MG tablet Take 2 tablets by mouth daily.     Yes Historical Provider, MD  loratadine (CLARITIN) 10 MG tablet Take 10 mg by mouth as needed.    Yes Historical Provider, MD  metoprolol tartrate (LOPRESSOR) 25 MG tablet Take 1 tablet PO prn for chest palpitations 01/20/11  Yes [place], [place]  Multiple Vitamin (MULTIVITAMIN) tablet Take 2 tablets by mouth daily.     Yes Historical Provider, MD  Multiple Vitamins-Minerals (ZINC PO) Take by mouth daily.     Yes Historical Provider, MD  VITAMIN D, CHOLECALCIFEROL, PO Take  by mouth daily.     Yes Historical Provider, MD      Allergies:  Allergies  Allergen Reactions  . Amoxicillin   . Latex     REACTION: rash    History   Social History  . Marital Status: Married    Spouse Name: Shelby Grant    Number of Children: Shelby Grant  . Years of Education: Shelby Grant   Occupational History  . Not on file.   Social History Main Topics  . Smoking status: Never Smoker   . Smokeless tobacco: Not on file  . Alcohol Use: No  . Drug Use: No  . Sexually Active:    Other Topics Concern  . Not on file     Social History Narrative  . No narrative on file     Family History  Problem Relation Age of Onset  . Asthma Sister      Review of Systems:  General: negative for chills, fever, night sweats or weight changes.  Cardiovascular: negative for chest pain, edema, orthopnea, palpitations, paroxysmal nocturnal dyspnea, shortness of breath or dyspnea on exertion Dermatological: negative for rash Respiratory: negative for cough or wheezing Urologic: negative for hematuria Abdominal: negative for nausea, vomiting, diarrhea, bright red blood per rectum, melena, or hematemesis Neurologic: negative for visual changes, syncope, or dizziness All other systems reviewed and are otherwise negative except as noted above.          Physical Exam:  Blood pressure 126/70, pulse 60, height 5\' 6"  (1.676 m), weight 134 lb 6.4 oz (60.963 kg), last menstrual period 12/21/2010. General: Well developed, well nourished, in no acute distress. Head: Normocephalic, atraumatic, sclera non-icteric, no xanthomas, nares are without discharge. Lymph Nodes:  none Neck: Negative for carotid bruits. JVD not elevated. Lungs: Clear bilaterally to auscultation without wheezes, rales, or rhonchi. Breathing is unlabored. Heart: RRR with S1 S2. No murmurs, rubs, or gallops appreciated. Abdomen: Soft, non-tender, non-distended with normoactive bowel sounds. No hepatomegaly. No rebound/guarding. No obvious abdominal masses. Msk:  Strength and tone appear normal for age. Extremities: No clubbing or cyanosis. No edema.  Distal pedal pulses are 2+ and equal bilaterally. Skin: Warm and Dry Neuro: Alert and oriented X 3. CN III-XII intact Grossly normal sensory and motor function . Psych:  Responds to questions appropriately with a normal affect.      The patient brought with her EMS records. The tachycardia initiates is a regular tachycardia at a cycle length of about 300 ms. Over the following numerous pages, there are  persistent group beating with different numbers of beats. In each case the first 2 beats have a cycle length that is consistent the third fourth beats also have a cycle length that is consistent that is somewhat shorter than the first 2 beats. This is most consistent with atrial tachycardia with Wenckebach periodicity. At the breaks P waves can be identified iwth a cycle length of 300 ms.  While the patient was here she had palpitations and weakness. Her heart rate was recorded at 65. Her blood pressure was 160. She felt quite weak. We laid her down. Vital signs remained stable despite her ongoing symptoms.

## 2011-02-08 NOTE — Patient Instructions (Signed)
Your physician wants you to follow-up in: 4 MONTHS You will receive a reminder letter in the mail two months in advance. If you don't receive a letter, please call our office to schedule the follow-up appointment.   START METOPROLOL SUCC 25 MG ONCE DAILY

## 2011-02-08 NOTE — Assessment & Plan Note (Signed)
I suspect that this is a large part of the overall symptom complex. I've encouraged her to seek relief

## 2011-02-08 NOTE — Assessment & Plan Note (Signed)
The patient has atrial tachycardia with Wenckebach periodicity. There is also significant anxiety component  These have been not infrequently and are associated with relatively significant symptoms. We discussed treatment options including daily beta blockers/calcium blockers, prn drugs including antiarrhythmic drugs i.e. flecainide, as well as catheter ablation. She would both like to defer the latter for right now.  Given the overall level of anxiety with this, we have elected to start her on metoprolol succinate 25 mg a day. We reviewed side effects.   Will see her again in about 4 months. We'll see how this does. She can take her tartrate as needed in addition

## 2011-02-11 ENCOUNTER — Ambulatory Visit (INDEPENDENT_AMBULATORY_CARE_PROVIDER_SITE_OTHER): Payer: 59 | Admitting: Family Medicine

## 2011-02-11 ENCOUNTER — Encounter: Payer: Self-pay | Admitting: Family Medicine

## 2011-02-11 ENCOUNTER — Telehealth: Payer: Self-pay | Admitting: Family Medicine

## 2011-02-11 DIAGNOSIS — I498 Other specified cardiac arrhythmias: Secondary | ICD-10-CM

## 2011-02-11 DIAGNOSIS — N951 Menopausal and female climacteric states: Secondary | ICD-10-CM

## 2011-02-11 DIAGNOSIS — I471 Supraventricular tachycardia: Secondary | ICD-10-CM

## 2011-02-11 MED ORDER — CONJ ESTROG-MEDROXYPROGEST ACE 0.625-2.5 MG PO TABS: 1.0000 | ORAL_TABLET | Freq: Every day | ORAL | Status: DC

## 2011-02-11 NOTE — Telephone Encounter (Signed)
Shelby Grant I called in the generic BCPs.......... One tab daily for 21 days, then take the first pill of the second pack on day 22

## 2011-02-11 NOTE — Telephone Encounter (Signed)
Pt said that the copay for estrogen, conjugated,-medroxyprogesterone (PREMPRO) 0.625-2.5 MG per tablet is $60. Pts insurance has recommended the following alternatives: Prefest or Sestradiol Northindron. Pt wants to know which one Dr Tawanna Cooler would recommend or does pcp want pt to get the Prempro? Need to know answer asap today, so pt can get med tonight and take as instructed.

## 2011-02-11 NOTE — Progress Notes (Signed)
  Subjective:    Patient ID: Shelby Grant, female    DOB: May 06, 1955, 56 y.o.   MRN: [id number]  HPI Shelby Grant is a delightful, 56 year old, married female, nonsmoker, who comes in today for evaluation of rapid heart rate and possible panic attacks.  Three weeks ago.  She had abrupt episode of rapid heart rate.  She called EMS went to the emergency room by the time she got there was back in regular rhythm.  Follow-up echocardiogram by Dr. Sw[place] was normal.  Follow-up cardiac evaluation by Dr. Graciela Husbands in this past Monday showed what he considers atrial tachycardia.  Also, during the office visit she had an episode of lightheadedness, dizziness, panic type attack.  Dr. Graciela Husbands recommended that she come back and see [place] for reevaluation.  She is not had a history of panic attacks in the past.  Family history negative.  Review of systems pertinent in that she had her last period November 2012 since that, time.  She's had symptoms of low estrogen with dry skin.  Dry.  Vagina, hot flushes, increased irritability.  We spent about 20 minutes discussing the interaction between hormones in mental health.  We also discussed panic attacks, and other psychiatric potential diagnoses.  She has not started her Toprol yet.  I encouraged her to start that today.   Review of Systems General cardiovascular review of systems otherwise negative.  Psychiatric review of systems negative    Objective:   Physical Exam  Well-developed well-nourished, female, in no acute distress      Assessment & Plan:  Episodes question related to estrogen deficiency, versus other causes.  Plan Will begin a psychologic evaluation with Judithe Modest at the same time.  Will start her on HRT and also encouraged her to take the medication that Dr. Graciela Husbands as outlined.  Follow up in two weeks

## 2011-02-11 NOTE — Patient Instructions (Signed)
Begin the Prempro, tonight.  Return in two weeks for follow-up, but will

## 2011-02-16 ENCOUNTER — Telehealth: Payer: Self-pay | Admitting: *Deleted

## 2011-02-16 NOTE — Telephone Encounter (Signed)
Again I would recommend that she sees Shelby Grant for a consult and I would recommend that she follow up with [place] on the 24th as outlined

## 2011-02-16 NOTE — Telephone Encounter (Signed)
Pt wanted Dr. Tawanna Cooler to know that she increased the Wellbutrin 300 mg  One po q day, but has not started the BCP.  Does she need to see Judithe Modest?  Does she still need to see Dr. Tawanna Cooler on the 24 th?  [phone]

## 2011-02-19 ENCOUNTER — Telehealth: Payer: Self-pay | Admitting: *Deleted

## 2011-02-19 NOTE — Telephone Encounter (Signed)
Patient is aware of appointment °

## 2011-02-19 NOTE — Telephone Encounter (Signed)
Opened in Error.

## 2011-02-25 ENCOUNTER — Ambulatory Visit (INDEPENDENT_AMBULATORY_CARE_PROVIDER_SITE_OTHER): Payer: 59 | Admitting: Family Medicine

## 2011-02-25 ENCOUNTER — Encounter: Payer: Self-pay | Admitting: Family Medicine

## 2011-02-25 DIAGNOSIS — F411 Generalized anxiety disorder: Secondary | ICD-10-CM

## 2011-02-25 DIAGNOSIS — I498 Other specified cardiac arrhythmias: Secondary | ICD-10-CM

## 2011-02-25 DIAGNOSIS — N951 Menopausal and female climacteric states: Secondary | ICD-10-CM

## 2011-02-25 DIAGNOSIS — I471 Supraventricular tachycardia: Secondary | ICD-10-CM

## 2011-02-25 MED ORDER — BUPROPION HCL ER (XL) 300 MG PO TB24: 300.0000 mg | ORAL_TABLET | Freq: Every day | ORAL | Status: DC

## 2011-02-25 NOTE — Progress Notes (Signed)
  Subjective:    Patient ID: Shelby Grant, female    DOB: 1955/03/03, 56 y.o.   MRN: [id number]  HPI Shelby Grant is a 56 year old female, who comes in today for evaluation of 3 problems.  She had a history of SVT.  She is currently stable on Toprol and 5 mg of Toprol daily.  She has been seen by Dr. Alberteen Spindle and is cardiac-wise, stable.  Since that, episode.  She's had more issues with anxiety and depression.  She increased her Wellbutrin to 300 mg a day from 200 but still feels anxious.  When she conference issues, where she has to make a decision, things like that cause her to feel anxious.  This is never been her MO in the past.  She is not taking the HRT, Prempro, Because she didn't want the HRT medication to interfere with her cardiac medications.  I explained they were independent  She still said soreness in her right hip.  She's been to see orthopedist.   Review of Systems    General cardiovascular GYN.  Psychiatric review of systems otherwise negative Objective:   Physical Exam Well-developed well-nourished, female, with depressed affect.  Examination of the hips shows the left hip to be normal.  There is some decreased range of motion.  Right hip consistent with some arthritis.  Abdominal exam negative.  Cystic lesion right breast at the 12 o'clock position just above the nipple.  A soft, rubbery, movable       Assessment & Plan:  SVT clinically stable.  Switch and take the 12.5 mg of Toprol nightly  Postmenopausal symptoms start the Prempro.  Depression.  Continue Wellbutrin 300 mg q. A.m. And called Dr. Rolly Pancake, Nolen Mu to get set up for a consult.  Os urethritis, right hip, Motrin 400 mg b.i.d. With food

## 2011-02-25 NOTE — Patient Instructions (Signed)
Take 300 mg of Wellbutrin in the morning and call Dr. Rolly Pancake, Nolen Mu to get set up for consult.  Take the 12.5 mg of Toprol at bedtime.  Motrin 400 mg twice daily with food for the soreness in the right hip.  Start the Prempro and a baby aspirin nightly at bedtime.  Return to see me after your consult with Dr. Nolen Mu

## 2011-02-26 ENCOUNTER — Encounter: Payer: Self-pay | Admitting: Internal Medicine

## 2011-03-19 ENCOUNTER — Telehealth: Payer: Self-pay | Admitting: *Deleted

## 2011-03-19 NOTE — Telephone Encounter (Signed)
Pt states she has been on he hormones Dr. Tawanna Cooler gave her and has now started her period.  Is asking for advice on what to do?

## 2011-03-22 NOTE — Telephone Encounter (Signed)
Shelby Grant I called Nyaira she feels much better on the continuous low dose BCPs therefore I recommended she continue that and followup with [place] in 4 weeks,,,,,,,,,,,,,,,,,,,,,,,,

## 2011-04-13 ENCOUNTER — Ambulatory Visit (INDEPENDENT_AMBULATORY_CARE_PROVIDER_SITE_OTHER): Payer: 59 | Admitting: Family Medicine

## 2011-04-13 ENCOUNTER — Encounter: Payer: Self-pay | Admitting: Family Medicine

## 2011-04-13 DIAGNOSIS — I498 Other specified cardiac arrhythmias: Secondary | ICD-10-CM

## 2011-04-13 DIAGNOSIS — N926 Irregular menstruation, unspecified: Secondary | ICD-10-CM

## 2011-04-13 DIAGNOSIS — I471 Supraventricular tachycardia: Secondary | ICD-10-CM

## 2011-04-13 NOTE — Patient Instructions (Signed)
Stop the BCPs after 63 days  Return in May for your annual exam sooner if any problems

## 2011-04-13 NOTE — Progress Notes (Signed)
  Subjective:    Patient ID: Shelby Grant, female    DOB: 29-Sep-1955, 56 y.o.   MRN: [id number]  HPI Shelby Grant is a 56 year old married female nonsmoker who comes in today for evaluation of multiple issues  We recently started her on BCPs because of severe dysfunctional uterine bleeding and hot flashes. She's taking the continuous three-month cycle. We discussed stopping or starting she wants to stop after 3 packs to see how she feels.  She takes the Klonopin at bedtime when necessary  Dr. Nolen Mu has her on a combination of Wellbutrin 300 mg daily and Zoloft 50 mg daily for depression and OCD. She feels like she is doing well she is due to see Dr. Nolen Mu this spring for followup  She also has a history of atrial tachycardia and a son Toprol 12.5 mg daily. She states she feels well and has had one episode of breakthrough rapid heart rate that went away in about 5 minutes on a sound about 2 weeks ago. She is due to go back and see Dr. Graciela Husbands this spring also.  No side effects from BCPs    Review of Systems    general and cardiovascular review of systems otherwise negative Objective:   Physical Exam Well-developed and nourished female in no acute distress cardiac exam normal       Assessment & Plan:  Dysfunctional uterine bleeding with severe menopausal hot flashes continue BCPs for 3 months then stop  Depression followup with Dr. Nolen Mu  Atrial tachycardia asymptomatic on 12.5 mg of Toprol daily  Return in May for annual exam sooner if any problems

## 2011-05-11 ENCOUNTER — Ambulatory Visit (INDEPENDENT_AMBULATORY_CARE_PROVIDER_SITE_OTHER): Payer: 59 | Admitting: Family Medicine

## 2011-05-11 ENCOUNTER — Encounter: Payer: Self-pay | Admitting: Family Medicine

## 2011-05-11 VITALS — BP 108/70 | Temp 98.1°F | Wt 135.0 lb

## 2011-05-11 DIAGNOSIS — R109 Unspecified abdominal pain: Secondary | ICD-10-CM | POA: Insufficient documentation

## 2011-05-11 DIAGNOSIS — J309 Allergic rhinitis, unspecified: Secondary | ICD-10-CM

## 2011-05-11 DIAGNOSIS — L84 Corns and callosities: Secondary | ICD-10-CM

## 2011-05-11 MED ORDER — FLUTICASONE PROPIONATE 50 MCG/ACT NA SUSP: NASAL | Status: DC

## 2011-05-11 NOTE — Progress Notes (Signed)
  Subjective:    Patient ID: Shelby Grant, female    DOB: March 01, 1955, 56 y.o.   MRN: [id number]  HPI [name] is a 56 year old married female nonsmoker who comes in today for evaluation of 3 problems  She has a bump on the ventral surface of her left foot is painful  She has allergic rhinitis she's taking Claritin 10 mg daily it doesn't seem to be helping she has a lot of sinus pressure  For the past 2 weeks she's had symptoms of abdominal cramps bloating and loose bowel movements. No fever no vomiting. Recent colonoscopy 4 years ago normal. No history of food intolerance no weight loss   Review of Systems General ENT GI dermatologic review of systems otherwise negative    Objective:   Physical Exam Well-developed and nourished female in acute distress examination of the HEENT was negative except for 3+ nasal edema neck was supple no adenopathy abdominal exam abdomen is flat bowel sounds are normal no palpable masses no tenderness rectal normal stool guaiac-neg  Extremity exam shows a: Ventral left foot ,,,,,,,,,,,,, it was excised with a 15 sterile blade        Assessment & Plan:  Healthy female  Allergic rhinitis switch to Zyrtec at steroid nasal spray  Abdominal cramps bloating intermittent diarrhea probable IBS plan lactose-free diet GI consult if symptoms persist  : Left foot lesion excised with a sterile 15 blade

## 2011-05-11 NOTE — Patient Instructions (Signed)
Use the moleskin to cover the: On your left foot  Soak and trim  weekly when necessary  : A complete lactose free diet if your symptoms persist and then I would recommend a GI consult  Switch to plain Zyrtec and add 1 shot a steroid nasal spray up each nostril at bedtime

## 2011-06-03 ENCOUNTER — Other Ambulatory Visit: Payer: 59

## 2011-06-10 ENCOUNTER — Encounter: Payer: 59 | Admitting: Family Medicine

## 2011-07-08 ENCOUNTER — Ambulatory Visit (INDEPENDENT_AMBULATORY_CARE_PROVIDER_SITE_OTHER): Payer: 59 | Admitting: Internal Medicine

## 2011-07-08 ENCOUNTER — Encounter: Payer: Self-pay | Admitting: Internal Medicine

## 2011-07-08 VITALS — BP 110/62 | HR 69 | Ht 66.0 in | Wt 135.0 lb

## 2011-07-08 DIAGNOSIS — I498 Other specified cardiac arrhythmias: Secondary | ICD-10-CM

## 2011-07-08 DIAGNOSIS — T148XXA Other injury of unspecified body region, initial encounter: Secondary | ICD-10-CM | POA: Insufficient documentation

## 2011-07-08 DIAGNOSIS — I471 Supraventricular tachycardia: Secondary | ICD-10-CM

## 2011-07-08 DIAGNOSIS — F411 Generalized anxiety disorder: Secondary | ICD-10-CM

## 2011-07-08 NOTE — Patient Instructions (Signed)
Your physician wants you to follow-up in: 1 year with Dr. Graciela Husbands. You will receive a reminder letter in the mail two months in advance. If you don't receive a letter, please call our office to schedule the follow-up appointment.  Your physician has recommended you make the following change in your medication:  1) Stop aspirin.

## 2011-07-08 NOTE — Assessment & Plan Note (Signed)
She is doing much better with this on her care be. She is seeing a psychiatrist

## 2011-07-08 NOTE — Assessment & Plan Note (Signed)
she has bruising on her arm. I am not a big fan aspirin and low medications. I asked her to go ahead and stop it.

## 2011-07-08 NOTE — Assessment & Plan Note (Signed)
She is doing well on her low dose beta blocker. She asked if she could stop it. We discussed the potential implications of recurrence. She will continue it for now.

## 2011-07-08 NOTE — Progress Notes (Signed)
HPI  Shelby Grant is a 56 y.o. female Seen in followup for atrial tachycardia associated with Wenckebach periodicity.  At our last Visit we started her on metoprolol to see if she would tolerate it.  She did pretty well. On her own changed from 25-12.5 because of "funny feelings in her head". She has had no sustained arrhythmia.  She has pursued support for her anxiety/depression and is doing much better with this. Her father continues to be a curmudgeon  .  Past Medical History  Diagnosis Date  . ANXIETY 06/02/2007  . BELL'S PALSY, LEFT 12/02/2008  . Supraventricular tachycardia 06/02/2007    Atrial tachycardia with Wenckebach periodicity  . ALLERGIC RHINITIS 07/03/2008  . BREAST MASS, RIGHT 06/03/2008  . IRREGULAR MENSES 06/03/2008  . CELLULITIS, LEFT LEG 09/14/2008  . CORNS AND CALLUSES 07/03/2008  . FOOT PAIN 09/22/2009  . CHEST PAIN 07/07/2009    Past Surgical History  Procedure Date  . Wisdom tooth extraction     Current Outpatient Prescriptions  Medication Sig Dispense Refill  . Alfalfa TABS Take by mouth.      . Ascorbic Acid (VITAMIN C PO) Take 2 tablets by mouth daily.        Marland Kitchen aspirin 81 MG tablet Take 81 mg by mouth daily.        . B Complex Vitamins (VITAMIN B COMPLEX PO) Take 2 tablets by mouth daily.        Marland Kitchen buPROPion (WELLBUTRIN XL) 300 MG 24 hr tablet Take 1 tablet (300 mg total) by mouth daily.  100 tablet  3  . Calcium-Magnesium-Vitamin D (CALCIUM MAGNESIUM PO) Take 4 tablets by mouth daily.        . clonazePAM (KLONOPIN) 1 MG tablet Take 0.5 tablets (0.5 mg total) by mouth daily.  90 tablet  1  . fish oil-omega-3 fatty acids 1000 MG capsule Take 3 g by mouth daily.      . fluticasone (FLONASE) 50 MCG/ACT nasal spray 1 spray up each nostril at bedtime  16 g  6  . GARLIC PO Take 2 tablets by mouth daily.       Marland Kitchen glucosamine-chondroitin 500-400 MG tablet Take 2 tablets by mouth daily.        Marland Kitchen loratadine (CLARITIN) 10 MG tablet Take 10 mg by mouth as needed.         . metoprolol succinate (TOPROL-XL) 25 MG 24 hr tablet Take 12.5 mg by mouth.       . Multiple Vitamin (MULTIVITAMIN) tablet Take 2 tablets by mouth daily.        . Multiple Vitamins-Minerals (ZINC PO) Take by mouth daily.      . sertraline (ZOLOFT) 50 MG tablet Take 50 mg by mouth daily.       Marland Kitchen VITAMIN D, CHOLECALCIFEROL, PO Take by mouth daily.        . vitamin E 100 UNIT capsule Take 100 Units by mouth daily.      Marland Kitchen DISCONTD: estrogen, conjugated,-medroxyprogesterone (PREMPRO) 0.625-2.5 MG per tablet Take 1 tablet by mouth daily.  28 tablet  11  . DISCONTD: metoprolol tartrate (LOPRESSOR) 25 MG tablet Take 1 tablet PO prn for chest palpitations  10 tablet  0    Allergies  Allergen Reactions  . Amoxicillin   . Latex     REACTION: rash    Review of Systems negative except from HPI and PMH  Physical Exam There were no vitals taken for this visit. Well developed and well nourished in  no acute distress HENT normal E scleral and icterus clear Neck Supple JVP flat; carotids brisk and full Clear to ausculation Regular rate and rhythm, no murmurs gallops or rub Soft with active bowel sounds No clubbing cyanosis none Edema Alert and oriented, grossly normal motor and sensory function Skin Warm and Dry some bruising on her arm  Electrocardiogram demonstrates sinus rhythm at 69 Intervals 01/09/39 Nonspecific ST changes Axis 88    Assessment and  Plan

## 2011-07-14 ENCOUNTER — Telehealth: Payer: Self-pay | Admitting: Family Medicine

## 2011-07-14 NOTE — Telephone Encounter (Signed)
Caller: Shelby Grant/Patient, [phone].  is wanting go back on Kelnor for hormonal issues. Per EPIC, Pt stopped BCP but had no side effects from BCP.  Pt is now ready to go back on.  Pt would like BCP, Kelnor, called into [facility], [place].

## 2011-07-15 MED ORDER — ETHYNODIOL DIAC-ETH ESTRADIOL 1-35 MG-MCG PO TABS: 1.0000 | ORAL_TABLET | Freq: Every day | ORAL | Status: DC

## 2011-07-15 NOTE — Telephone Encounter (Signed)
Fleet Contras okay to refill until next physical

## 2011-08-03 ENCOUNTER — Telehealth: Payer: Self-pay | Admitting: Family Medicine

## 2011-08-03 ENCOUNTER — Other Ambulatory Visit: Payer: Self-pay | Admitting: Family Medicine

## 2011-08-03 NOTE — Telephone Encounter (Signed)
Patient is aware 

## 2011-08-03 NOTE — Telephone Encounter (Signed)
Yes, OK to double up until bleeding has resolved; f/u  Dr Tawanna Cooler one month

## 2011-08-03 NOTE — Telephone Encounter (Signed)
Caller: Shelby Grant; PCP: Kelle Darting A.;Call regarding Medication Question: Taking Birth Control Pills and Is Having light Vaginal Bleeding - Onset 07/29/11; She was taking Kelnor 2 months ago and had break through bleeding and was instructed to double up on pills until bleeding stopped. SHE IS WONDERING IF DR. TODD WOULD ADVISED THAT SHE DO THE SAME THING NOW. PLEASE CHECK WITH HIM AND CALL HER BACK AT  CB#: [phone]; Medication question Protocol.

## 2011-08-12 ENCOUNTER — Telehealth: Payer: Self-pay | Admitting: Family Medicine

## 2011-08-12 MED ORDER — ETHYNODIOL DIAC-ETH ESTRADIOL 1-35 MG-MCG PO TABS: 1.0000 | ORAL_TABLET | Freq: Every day | ORAL | Status: DC

## 2011-08-12 NOTE — Telephone Encounter (Signed)
Patient is going out of town and is still doubled up her Rx for birth control and needs an early refill because she is going out of town.  Rx sent.

## 2011-08-12 NOTE — Telephone Encounter (Signed)
Pt has questions regarding her birth control pills and requesting to be contacted by Fleet Contras

## 2011-08-26 ENCOUNTER — Other Ambulatory Visit (INDEPENDENT_AMBULATORY_CARE_PROVIDER_SITE_OTHER): Payer: 59

## 2011-08-26 DIAGNOSIS — Z0000 Encounter for general adult medical examination without abnormal findings: Secondary | ICD-10-CM

## 2011-08-26 DIAGNOSIS — Z79899 Other long term (current) drug therapy: Secondary | ICD-10-CM

## 2011-08-26 LAB — HEPATIC FUNCTION PANEL
ALT: 25 U/L (ref 0–35)
AST: 20 U/L (ref 0–37)
Bilirubin, Direct: 0 mg/dL (ref 0.0–0.3)
Total Bilirubin: 0.4 mg/dL (ref 0.3–1.2)
Total Protein: 6.7 g/dL (ref 6.0–8.3)

## 2011-08-26 LAB — BASIC METABOLIC PANEL
BUN: 13 mg/dL (ref 6–23)
Chloride: 103 mEq/L (ref 96–112)
GFR: 82.68 mL/min (ref >60.00)
Potassium: 3.7 mEq/L (ref 3.5–5.1)

## 2011-08-26 LAB — CBC WITH DIFFERENTIAL/PLATELET
Basophils Relative: 0.1 % (ref 0.0–3.0)
Eosinophils Relative: 3 % (ref 0.0–5.0)
HCT: 39 % (ref 36.0–46.0)
Lymphs Abs: 1.6 10*3/uL (ref 0.7–4.0)
Monocytes Relative: 8.4 % (ref 3.0–12.0)
Neutrophils Relative %: 64.4 % (ref 43.0–77.0)
Platelets: 220 10*3/uL (ref 150.0–400.0)
RBC: 4.11 Mil/uL (ref 3.87–5.11)
WBC: 6.6 10*3/uL (ref 4.5–10.5)

## 2011-08-26 LAB — POCT URINALYSIS DIPSTICK
Glucose, UA: NEGATIVE
Leukocytes, UA: NEGATIVE
Nitrite, UA: NEGATIVE
Urobilinogen, UA: 0.2

## 2011-08-26 LAB — LIPID PANEL
Total CHOL/HDL Ratio: 2
VLDL: 13.8 mg/dL (ref 0.0–40.0)

## 2011-08-26 LAB — LDL CHOLESTEROL, DIRECT: Direct LDL: 110.9 mg/dL

## 2011-08-31 ENCOUNTER — Encounter: Payer: Self-pay | Admitting: Family Medicine

## 2011-08-31 ENCOUNTER — Ambulatory Visit (INDEPENDENT_AMBULATORY_CARE_PROVIDER_SITE_OTHER): Payer: 59 | Admitting: Family Medicine

## 2011-08-31 ENCOUNTER — Other Ambulatory Visit (HOSPITAL_COMMUNITY)
Admission: RE | Admit: 2011-08-31 | Discharge: 2011-08-31 | Disposition: A | Discharge status: Home / Self Care | Payer: 59 | Source: Ambulatory Visit | Attending: Family Medicine | Admitting: Family Medicine

## 2011-08-31 VITALS — BP 118/74 | Temp 98.6°F | Ht 72.0 in | Wt 136.0 lb

## 2011-08-31 DIAGNOSIS — Z01419 Encounter for gynecological examination (general) (routine) without abnormal findings: Secondary | ICD-10-CM

## 2011-08-31 DIAGNOSIS — N841 Polyp of cervix uteri: Secondary | ICD-10-CM

## 2011-08-31 DIAGNOSIS — I498 Other specified cardiac arrhythmias: Secondary | ICD-10-CM

## 2011-08-31 DIAGNOSIS — N951 Menopausal and female climacteric states: Secondary | ICD-10-CM

## 2011-08-31 DIAGNOSIS — I471 Supraventricular tachycardia: Secondary | ICD-10-CM

## 2011-08-31 DIAGNOSIS — F411 Generalized anxiety disorder: Secondary | ICD-10-CM

## 2011-08-31 MED ORDER — SERTRALINE HCL 50 MG PO TABS: 50.0000 mg | ORAL_TABLET | Freq: Every day | ORAL | Status: DC

## 2011-08-31 MED ORDER — BUPROPION HCL ER (XL) 300 MG PO TB24: 300.0000 mg | ORAL_TABLET | Freq: Every day | ORAL | Status: DC

## 2011-08-31 MED ORDER — CLONAZEPAM 0.5 MG PO TABS: ORAL_TABLET | ORAL | Status: DC

## 2011-08-31 MED ORDER — METOPROLOL SUCCINATE ER 25 MG PO TB24: 12.5000 mg | ORAL_TABLET | Freq: Every day | ORAL | Status: DC

## 2011-08-31 NOTE — Progress Notes (Signed)
  Subjective:    Patient ID: Shelby Grant, female    DOB: Aug 30, 1955, 56 y.o.   MRN: [id number]  HPI Shelby Grant is a 56 year old married female nonsmoker who comes in today for general physical examination  She takes Wellbutrin 300 mg daily along with 50 mg of Zoloft at bedtime for history of mild depression  She takes Klonopin 0.25 mg each bedtime when necessary  She takes Toprol 25 mg dose one half tab every morning because of history of PAF.  She gets routine eye care, dental care, colonoscopy and GI normal, she's not due BSE monthly, mammogram May normal  There. Started 3 weeks ago she doubled her pills. Stop if she continues to have spotting and bleeding.    Review of Systems  Constitutional: Negative.   HENT: Negative.   Eyes: Negative.   Respiratory: Negative.   Cardiovascular: Negative.   Gastrointestinal: Negative.   Genitourinary: Negative.   Musculoskeletal: Negative.   Neurological: Negative.   Hematological: Negative.   Psychiatric/Behavioral: Negative.        Objective:   Physical Exam  Constitutional: She appears well-developed and well-nourished.  HENT:  Head: Normocephalic and atraumatic.  Right Ear: External ear normal.  Left Ear: External ear normal.  Nose: Nose normal.  Mouth/Throat: Oropharynx is clear and moist.  Eyes: EOM are normal. Pupils are equal, round, and reactive to light.  Neck: Normal range of motion. Neck supple. No thyromegaly present.  Cardiovascular: Normal rate, regular rhythm, normal heart sounds and intact distal pulses.  Exam reveals no gallop and no friction rub.   No murmur heard. Pulmonary/Chest: Effort normal and breath sounds normal.  Abdominal: Soft. Bowel sounds are normal. She exhibits no distension and no mass. There is no tenderness. There is no rebound.  Genitourinary: Vagina normal and uterus normal. Guaiac negative stool. No vaginal discharge found.       She has a straight bleeding cervical polyp  Musculoskeletal:  Normal range of motion.  Lymphadenopathy:    She has no cervical adenopathy.  Neurological: She is alert. She has normal reflexes. No cranial nerve deficit. She exhibits normal muscle tone. Coordination normal.  Skin: Skin is warm and dry.  Psychiatric: She has a normal mood and affect. Her behavior is normal. Judgment and thought content normal.          Assessment & Plan:  Healthy female  History of depression continue Wellbutrin and Zoloft  History of mild anxiety continue Klonopin 0.25 each bedtime when necessary  History of cardiac arrhythmia continue Toprol 12.5 mg daily  Cervical polyp bleeding referred her GYN Dr. Arlyce Dice

## 2011-08-31 NOTE — Patient Instructions (Signed)
Call Dr. Ilda Mori at [phone] and asked to be seen ASAP,,,,,,,,,,, bleeding cervical polyp  Do a thorough breast exam monthly  Do a thorough skin exam monthly  Return in one year sooner if any problems

## 2011-09-01 ENCOUNTER — Other Ambulatory Visit: Payer: Self-pay

## 2011-09-02 ENCOUNTER — Encounter: Payer: 59 | Admitting: Family Medicine

## 2011-09-07 ENCOUNTER — Telehealth: Payer: Self-pay | Admitting: Family Medicine

## 2011-09-07 NOTE — Telephone Encounter (Signed)
Pt was seen on 08-31-2011 for cpx. Pt has headache,nausea,ear pain and fatigue started during the night. [facility] [place] [phone] please advise

## 2011-09-07 NOTE — Telephone Encounter (Signed)
Patient will call back if no improvement or fever

## 2011-09-07 NOTE — Telephone Encounter (Signed)
Probable viral syndrome treat symptomatically with Tylenol or aspirin lots of liquids rest etc.

## 2011-09-09 ENCOUNTER — Encounter: Payer: 59 | Admitting: Family Medicine

## 2011-12-26 ENCOUNTER — Ambulatory Visit (INDEPENDENT_AMBULATORY_CARE_PROVIDER_SITE_OTHER): Payer: 59 | Admitting: Emergency Medicine

## 2011-12-26 ENCOUNTER — Ambulatory Visit: Payer: 59

## 2011-12-26 VITALS — BP 114/74 | HR 66 | Temp 98.1°F | Resp 16 | Ht 66.5 in | Wt 137.0 lb

## 2011-12-26 DIAGNOSIS — R1013 Epigastric pain: Secondary | ICD-10-CM

## 2011-12-26 DIAGNOSIS — N898 Other specified noninflammatory disorders of vagina: Secondary | ICD-10-CM

## 2011-12-26 LAB — POCT CBC
HCT, POC: 42.5 % (ref 37.7–47.9)
Hemoglobin: 13.3 g/dL (ref 12.2–16.2)
Lymph, poc: 2.2 (ref 0.6–3.4)
MCH, POC: 29.7 pg (ref 27–31.2)
MCHC: 31.3 g/dL — AB (ref 31.8–35.4)
MCV: 94.8 fL (ref 80–97)
POC LYMPH PERCENT: 36.7 %L (ref 10–50)
RDW, POC: 12.9 %
WBC: 6 10*3/uL (ref 4.6–10.2)

## 2011-12-26 LAB — POCT WET PREP WITH KOH
Clue Cells Wet Prep HPF POC: NEGATIVE
RBC Wet Prep HPF POC: NEGATIVE
Trichomonas, UA: NEGATIVE

## 2011-12-26 LAB — COMPREHENSIVE METABOLIC PANEL
Alkaline Phosphatase: 47 U/L (ref 39–117)
BUN: 18 mg/dL (ref 6–23)
CO2: 27 mEq/L (ref 19–32)
Glucose, Bld: 85 mg/dL (ref 70–99)
Total Bilirubin: 0.4 mg/dL (ref 0.3–1.2)

## 2011-12-26 NOTE — Progress Notes (Signed)
Pelvic exam performed.  Normal female external genitalia.  Vaginal mucosa is light pink and moist without lesion.  Cervix appears normal.  No FB noted within the vault.  Bimanual exam reveals no areas of reduced caliber of the vagina as with scar tissue.  The patient tolerated the exam without the pain she describes with intercourse ("burning," and occurs with initial penetration of the penis into the vagina).

## 2011-12-26 NOTE — Progress Notes (Signed)
  Subjective:    Patient ID: Shelby Grant, female    DOB: Sep 13, 1955, 56 y.o.   MRN: [id number]  HPI Patient presents with one day onset of epigastric pain. States she can feel an area of fullness of her upper abdomen. Pain is worse with standing up. Patient up to date with Colonoscopy. Has family history of abdominal cancer, mother deceased at the age of 43. Was found to have very large tumor of stomach, unknown primary, question about possible kidney cancer. Last period was 2 months ago. Patient also has had recent complaints of vaginal pain with intercourse. Husband feels like he is "hitting something" during intercourse.    Review of Systems     Objective:   Physical Exam  Well developed well nourished female, appears younger than stated age. Breath sounds clear bilateral. Abdomen soft, has tenderness upper abdomen. There is a fullness in the upper abdomen which appears to be adipose tissue. I did not feel a diastases recti and I did not feel a mass.  [facility] reading (PRIMARY) by  Dr. Cleta Alberts no abnormality seen on KUB. Results for orders placed in visit on 12/26/11  POCT CBC      Component Value Range   WBC 6.0  4.6 - 10.2 K/uL   Lymph, poc 2.2  0.6 - 3.4   POC LYMPH PERCENT 36.7  10 - 50 %L   MID (cbc) 0.6  0 - 0.9   POC MID % 10.5  0 - 12 %M   POC Granulocyte 3.2  2 - 6.9   Granulocyte percent 52.8  37 - 80 %G   RBC 4.48  4.04 - 5.48 M/uL   Hemoglobin 13.3  12.2 - 16.2 g/dL   HCT, POC 14.7  82.9 - 47.9 %   MCV 94.8  80 - 97 fL   MCH, POC 29.7  27 - 31.2 pg   MCHC 31.3 (*) 31.8 - 35.4 g/dL   RDW, POC 56.2     Platelet Count, POC 254  142 - 424 K/uL   MPV 8.3  0 - 99.8 fL  POCT WET PREP WITH KOH      Component Value Range   Trichomonas, UA Negative     Clue Cells Wet Prep HPF POC neg     Epithelial Wet Prep HPF POC 1-2     Yeast Wet Prep HPF POC neg     Bacteria Wet Prep HPF POC trace     RBC Wet Prep HPF POC neg     WBC Wet Prep HPF POC 1-3     KOH Prep POC Negative         Assessment & Plan:  PCP is Dr Governor Specking, GI is with [facility]. Dr Arlyce Dice is Gynocologist. Maryclare Labrador check a baseline CBC and Cmet We'll do an ultrasound of the abdomen to rule out any type of mass. We'll plan on ultrasound as stated and have her see Dr. Arlyce Dice to help evaluate her for her dyspareunia

## 2012-01-04 ENCOUNTER — Ambulatory Visit
Admission: RE | Admit: 2012-01-04 | Discharge: 2012-01-04 | Disposition: A | Discharge status: Home / Self Care | Payer: 59 | Source: Ambulatory Visit | Attending: Emergency Medicine | Admitting: Emergency Medicine

## 2012-01-04 ENCOUNTER — Telehealth: Payer: Self-pay

## 2012-01-04 DIAGNOSIS — R1013 Epigastric pain: Secondary | ICD-10-CM

## 2012-01-04 NOTE — Telephone Encounter (Signed)
Pt calling states that Dr. Cleta Alberts was supposed to be contacting her concerning results from imaging. [phone]

## 2012-01-06 NOTE — Telephone Encounter (Signed)
Pt called back to check [place] Abd results. I found and explained/apologized to pt that the orig phone message from 01/04/12 had not routed properly and we were not aware that she had called. Went over results and instr's for f/up from Dr Cleta Alberts (see notes on imaging report). Pt reported that she is still having same Sxs and advised her to RTC or see PCP for f/up. Faxed results of [place] Abd to Dr Tawanna Cooler and mailed copy to pt at her request and pt agreed she will call Dr Tawanna Cooler to see if she should f/up w/him or return here. I assured pt that any of our providers will be happy to see her for f/up if she wishes.

## 2012-02-14 ENCOUNTER — Encounter: Payer: Self-pay | Admitting: Family Medicine

## 2012-02-14 ENCOUNTER — Ambulatory Visit (INDEPENDENT_AMBULATORY_CARE_PROVIDER_SITE_OTHER): Payer: BC Managed Care – PPO | Admitting: Family Medicine

## 2012-02-14 VITALS — BP 110/70 | Temp 99.0°F | Wt 144.0 lb

## 2012-02-14 DIAGNOSIS — R109 Unspecified abdominal pain: Secondary | ICD-10-CM

## 2012-02-14 NOTE — Progress Notes (Signed)
  Subjective:    Patient ID: Shelia Media, female    DOB: [birth year], 56 y.o.   MRN: [id number]  HPI Joetta is a 57 year old married female nonsmoker who comes in today for evaluation of 2 problems  In November of 2013 she began having midepigastric abdominal fullness. She went to an urgent care had a physical exam lab work and ultrasound all of which are normal. She continues to have this symptom.  She has no fever chills nausea vomiting or diarrhea. Urinary habits normal. She says the symptoms come and go. She also has symmetric gastric burning that occurs up and he chest area the also comes and goes.  The second problem is pain in her right shoulder. She went to see Dr. Kathee Delton,,,,,, x-rays an injection to the right shoulder did not seem to help. She would like a second opinion   Review of Systems    general GI orthopedic review of systems otherwise negative Objective:   Physical Exam  Well-developed well-nourished female no acute distress examination the abdomen was negative  Right shoulder normal except for decreased range of motion consistent with a possible rotator cuff injury      Assessment & Plan:  Midepigastric abdominal fullness that comes and goes plan avoid a lactose Prilosec 20 mg daily if symptoms persist after one month GI evaluation  Right shoulder pain,,,,,,, consult with Caryn Bee supple

## 2012-02-14 NOTE — Patient Instructions (Addendum)
Avoid lactose  Prilosec 20 mg daily  If after 4 weeks your symptoms persist GI consult  I would call Dr. Caryn Bee supple for evaluation of your right shoulder

## 2012-03-01 ENCOUNTER — Other Ambulatory Visit: Payer: Self-pay | Admitting: Family Medicine

## 2012-03-01 DIAGNOSIS — R109 Unspecified abdominal pain: Secondary | ICD-10-CM

## 2012-03-01 DIAGNOSIS — R140 Abdominal distension (gaseous): Secondary | ICD-10-CM

## 2012-03-03 ENCOUNTER — Encounter: Payer: Self-pay | Admitting: Gastroenterology

## 2012-03-14 ENCOUNTER — Encounter: Payer: Self-pay | Admitting: Gastroenterology

## 2012-03-14 ENCOUNTER — Ambulatory Visit (INDEPENDENT_AMBULATORY_CARE_PROVIDER_SITE_OTHER): Payer: BC Managed Care – PPO | Admitting: Gastroenterology

## 2012-03-14 ENCOUNTER — Other Ambulatory Visit: Payer: BC Managed Care – PPO

## 2012-03-14 VITALS — BP 100/60 | HR 65 | Ht 66.5 in | Wt 137.0 lb

## 2012-03-14 DIAGNOSIS — R142 Eructation: Secondary | ICD-10-CM

## 2012-03-14 DIAGNOSIS — K5900 Constipation, unspecified: Secondary | ICD-10-CM

## 2012-03-14 DIAGNOSIS — R140 Abdominal distension (gaseous): Secondary | ICD-10-CM

## 2012-03-14 DIAGNOSIS — R141 Gas pain: Secondary | ICD-10-CM

## 2012-03-14 DIAGNOSIS — R109 Unspecified abdominal pain: Secondary | ICD-10-CM

## 2012-03-14 MED ORDER — CILIDINIUM-CHLORDIAZEPOXIDE 2.5-5 MG PO CAPS: 1.0000 | ORAL_CAPSULE | Freq: Three times a day (TID) | ORAL | Status: DC | PRN

## 2012-03-14 NOTE — Progress Notes (Signed)
History of Present Illness:  This is a very pleasant 57 year old Caucasian female referred through the courtesy of Dr. Tawanna Cooler for evaluation of several months of upper abdominal discomfort, gas, bloating, and mild constipation.  She denies true reflux symptoms but has had some discomfort in her subxiphoid area.  Treatment with Prilosec caused worsening of her problems.  Upper abdominal ultrasound exam was obtained and was normal.  Her main complaint currently is mild constipation, gas, bloating, and distention.  She's tried various dietary manipulations,, currently  a gluten restricted diet with mild improvement.  I did colonoscopy on this patient in January 2009 which was unremarkable except for mild diverticulosis.  Patient currently denies any specific hepatobiliary, or other upper GI complaints, history of hepatitis or pancreatitis.  She also denies fever, chills, or any systemic complaints.  Family history is remarkable for an unknown type of cancer in her mother.  Patient specifically denies melena, hematochezia, or any history of gynecologic problems.  She is followed closely by Dr. Ilda Mori in gynecology.  Pelvic ultrasound in February of 2012 was unremarkable.  She denies abuse of NSAIDs, alcohol, cigarettes.  I have reviewed this patient's present history, medical and surgical past history, allergies and medications.     ROS:   All systems were reviewed and are negative unless otherwise stated in the HPI.  Allergies  Allergen Reactions  . Amoxicillin   . Latex     REACTION: rash   Outpatient Prescriptions Prior to Visit  Medication Sig Dispense Refill  . Alfalfa TABS Take by mouth.      . Ascorbic Acid (VITAMIN C PO) Take 2 tablets by mouth daily.        . B Complex Vitamins (VITAMIN B COMPLEX PO) Take 2 tablets by mouth daily.        Marland Kitchen buPROPion (WELLBUTRIN XL) 300 MG 24 hr tablet Take 1 tablet (300 mg total) by mouth daily.  100 tablet  3  . Calcium-Magnesium-Vitamin D (CALCIUM  MAGNESIUM PO) Take 4 tablets by mouth daily.        . clonazePAM (KLONOPIN) 0.5 MG tablet One half tablet each bedtime when necessary  30 tablet  3  . fish oil-omega-3 fatty acids 1000 MG capsule Take 3 g by mouth daily.      Marland Kitchen GARLIC PO Take 2 tablets by mouth daily.       Marland Kitchen loratadine (CLARITIN) 10 MG tablet Take 10 mg by mouth as needed.       . metoprolol succinate (TOPROL-XL) 25 MG 24 hr tablet Take 0.5 tablets (12.5 mg total) by mouth daily.  50 tablet  3  . Multiple Vitamin (MULTIVITAMIN) tablet Take 2 tablets by mouth daily.        . Multiple Vitamins-Minerals (ZINC PO) Take by mouth daily.      . Nutritional Supplements (RNA-DNA PO) Take by mouth.      . sertraline (ZOLOFT) 50 MG tablet Take 1 tablet (50 mg total) by mouth daily.  100 tablet  3  . VITAMIN D, CHOLECALCIFEROL, PO Take by mouth daily.        . vitamin E 100 UNIT capsule Take 100 Units by mouth daily.       No facility-administered medications prior to visit.   Past Medical History  Diagnosis Date  . ANXIETY 06/02/2007  . BELL'S PALSY, LEFT 12/02/2008  . Supraventricular tachycardia 06/02/2007    Atrial tachycardia with Wenckebach periodicity  . ALLERGIC RHINITIS 07/03/2008  . BREAST MASS, RIGHT 06/03/2008  .  IRREGULAR MENSES 06/03/2008  . CELLULITIS, LEFT LEG 09/14/2008  . CORNS AND CALLUSES 07/03/2008  . FOOT PAIN 09/22/2009  . CHEST PAIN 07/07/2009  . OCD (obsessive compulsive disorder)    Past Surgical History  Procedure Laterality Date  . Wisdom tooth extraction     History   Social History  . Marital Status: Married    Spouse Name: N/A    Number of Children: N/A  . Years of Education: N/A   Occupational History  . Business Owner     Social History Main Topics  . Smoking status: Never Smoker   . Smokeless tobacco: Never Used  . Alcohol Use: Yes     Comment: social  . Drug Use: No  . Sexually Active: Yes     Comment: married   Other Topics Concern  . None   Social History Narrative   No caffeine     Family History  Problem Relation Age of Onset  . Asthma Sister   . Cancer Mother     Unsure where cancer started        Physical Exam: Blood pressure 100/80, pulse 65 and regular, and weight 137 with a BMI of 21.78. General well developed well nourished patient in no acute distress, appearing their stated age Eyes PERRLA, no icterus, fundoscopic exam per opthamologist Skin no lesions noted Neck supple, no adenopathy, no thyroid enlargement, no tenderness Chest clear to percussion and auscultation Heart no significant murmurs, gallops or rubs noted Abdomen no hepatosplenomegaly masses or tenderness, BS normal.  Rectal inspection normal no fissures, or fistulae noted.  No masses or tenderness on digital exam. Stool guaiac negative.  I cannot appreciate any significant distention.  There is some slight tenderness in the subxiphoid area.  Extremities no acute joint lesions, edema, phlebitis or evidence of cellulitis. Neurologic patient oriented x 3, cranial nerves intact, no focal neurologic deficits noted. Psychological mental status normal and normal affect.  Assessment and plan: Mild constipation, gas and bloating of relatively new onset, rule out celiac disease, H. pylori infection, NSAID gastropathy, versus gallbladder dysfunction.  A lot of her symptoms are consistent with IBS, and I have placed her on a FOD-MAP diet for IBS with when necessary Librax every 6-8 hours, we'll schedule endoscopic exam, celiac profile ordered, and I've urged her to continue her gynecology followup as scheduled.    Cc: Dr. Kelle Darting and Dr. Ilda Mori in gynecology  Encounter Diagnoses  Name Primary?  . Abdominal  pain, other specified site Yes  . Bloating

## 2012-03-14 NOTE — Patient Instructions (Addendum)
Your physician has requested that you go to the basement for the following lab work before leaving today: Celiac Panel  You have been scheduled for a upper endoscopy with propofol on 03-15-2012. Please follow written instructions given to you at your visit today.  Please pick up your prep kit at the pharmacy within the next 1-3 days. If you use inhalers (even only as needed) or a CPAP machine, please bring them with you on the day of your procedure. Please call by 5 pm today to cancel.  We have sent the following medications to your pharmacy for you to pick up at your convenience: Librax, please take as directed.  FOD MAP Diet and Artificial Sweetners diet given today.

## 2012-03-15 ENCOUNTER — Encounter: Payer: BC Managed Care – PPO | Admitting: Gastroenterology

## 2012-03-15 LAB — CELIAC PANEL 10
Gliadin IgA: 2.4 U/mL (ref <20)
Gliadin IgG: 4.3 U/mL (ref <20)
Tissue Transglut Ab: 5.2 U/mL (ref <20)

## 2012-03-22 ENCOUNTER — Encounter: Payer: BC Managed Care – PPO | Admitting: Gastroenterology

## 2012-04-19 ENCOUNTER — Encounter: Payer: Self-pay | Admitting: Gastroenterology

## 2012-04-19 ENCOUNTER — Other Ambulatory Visit: Payer: Self-pay | Admitting: *Deleted

## 2012-04-19 ENCOUNTER — Ambulatory Visit (AMBULATORY_SURGERY_CENTER): Payer: BC Managed Care – PPO | Admitting: Gastroenterology

## 2012-04-19 VITALS — BP 101/63 | HR 54 | Temp 97.4°F | Resp 38 | Ht 66.0 in | Wt 137.0 lb

## 2012-04-19 DIAGNOSIS — D1330 Benign neoplasm of unspecified part of small intestine: Secondary | ICD-10-CM

## 2012-04-19 DIAGNOSIS — R142 Eructation: Secondary | ICD-10-CM

## 2012-04-19 DIAGNOSIS — R109 Unspecified abdominal pain: Secondary | ICD-10-CM

## 2012-04-19 DIAGNOSIS — D131 Benign neoplasm of stomach: Secondary | ICD-10-CM

## 2012-04-19 DIAGNOSIS — R141 Gas pain: Secondary | ICD-10-CM

## 2012-04-19 MED ORDER — SODIUM CHLORIDE 0.9 % IV SOLN: 500.0000 mL | INTRAVENOUS | Status: DC

## 2012-04-19 MED ORDER — ESOMEPRAZOLE MAGNESIUM 40 MG PO CPDR: 40.0000 mg | DELAYED_RELEASE_CAPSULE | Freq: Every day | ORAL | Status: DC

## 2012-04-19 NOTE — Progress Notes (Signed)
Patient did not experience any of the following events: a burn prior to discharge; a fall within the facility; wrong site/side/patient/procedure/implant event; or a hospital transfer or hospital admission upon discharge from the facility. (G8907) Patient did not have preoperative order for IV antibiotic SSI prophylaxis. (G8918)  

## 2012-04-19 NOTE — Progress Notes (Signed)
Report to pacu RN, vss, bbs=clear 

## 2012-04-19 NOTE — Progress Notes (Signed)
Called to room to assist during endoscopic procedure.  Patient ID and intended procedure confirmed with present staff. Received instructions for my participation in the procedure from the performing physician.  

## 2012-04-19 NOTE — Op Note (Signed)
[facility] [address] [place], [address]   ENDOSCOPY PROCEDURE REPORT  PATIENT: Shelby, Grant.  MR#: [id number] BIRTHDATE: Jan 31, 1956 , 56  yrs. old GENDER: Female ENDOSCOPIST:David Hale Bogus, MD, Clementeen Graham REFERRED BY: Roderick Pee, M.D. PROCEDURE DATE:  04/19/2012 PROCEDURE:   EGD w/ biopsy and EGD w/ biopsy for H.pylori ASA CLASS:    Class II INDICATIONS: Epigastric pain and Dyspepsia. MEDICATION: propofol (Diprivan) 150mg  IV TOPICAL ANESTHETIC:   Cetacaine Spray  DESCRIPTION OF PROCEDURE:   After the risks and benefits of the procedure were explained, informed consent was obtained.  The LB GIF-H180 [id number]  endoscope was introduced through the mouth  and advanced to the second portion of the duodenum .  The instrument was slowly withdrawn as the mucosa was fully examined.      DUODENUM: The duodenal mucosa showed no abnormalities in the bulb and second portion of the duodenum.  Cold forcep biopsies were taken in the second portion.  STOMACH: The mucosa of the stomach appeared normal.  A CLO biopsy was performed.  ESOPHAGUS: There was evidence of suspected Barrett's esophagus at the gastroesophageal junction.  Multiple biopsies were performed using cold forceps. See pictures... Sample obtained to rule out Barrett's esophagus.    Retroflexed views revealed no abnormalities.    The scope was then withdrawn from the patient and the procedure completed.  COMPLICATIONS: There were no complications.   ENDOSCOPIC IMPRESSION: 1.   The duodenal mucosa showed no abnormalities in the bulb and second portion of the duodenum ,,biopsies done to R/O celiac disease. 2.   The mucosa of the stomach appeared normal; biopsy done for H.pylori 3.   There was evidence of suspected Barrett's esophagus; multiple biopsies ..granular GE junction but no erosions or definite stricture.  RECOMMENDATIONS: 1.  Await pathology results  2.   continue daily PPI  RX.    _______________________________ eSigned:  Mardella Layman, MD, [facility] 04/19/2012 10:00 AM   standard discharge   PATIENT NAME:  Shelby Grant. MR#: [id number]

## 2012-04-19 NOTE — Patient Instructions (Addendum)
Discharge instructions given with verbal understanding. Biopsies taken. Resume previous medications. Samples given of Nexium. YOU HAD AN ENDOSCOPIC PROCEDURE TODAY AT THE [facility]: Refer to the procedure report that was given to you for any specific questions about what was found during the examination.  If the procedure report does not answer your questions, please call your gastroenterologist to clarify.  If you requested that your care partner not be given the details of your procedure findings, then the procedure report has been included in a sealed envelope for you to review at your convenience later.  YOU SHOULD EXPECT: Some feelings of bloating in the abdomen. Passage of more gas than usual.  Walking can help get rid of the air that was put into your GI tract during the procedure and reduce the bloating. If you had a lower endoscopy (such as a colonoscopy or flexible sigmoidoscopy) you may notice spotting of blood in your stool or on the toilet paper. If you underwent a bowel prep for your procedure, then you may not have a normal bowel movement for a few days.  DIET: Your first meal following the procedure should be a light meal and then it is ok to progress to your normal diet.  A half-sandwich or bowl of soup is an example of a good first meal.  Heavy or fried foods are harder to digest and may make you feel nauseous or bloated.  Likewise meals heavy in dairy and vegetables can cause extra gas to form and this can also increase the bloating.  Drink plenty of fluids but you should avoid alcoholic beverages for 24 hours.  ACTIVITY: Your care partner should take you home directly after the procedure.  You should plan to take it easy, moving slowly for the rest of the day.  You can resume normal activity the day after the procedure however you should NOT DRIVE or use heavy machinery for 24 hours (because of the sedation medicines used during the test).    SYMPTOMS TO REPORT  IMMEDIATELY: A gastroenterologist can be reached at any hour.  During normal business hours, 8:30 AM to 5:00 PM Monday through Friday, call [phone].  After hours and on weekends, please call the GI answering service at [phone] who will take a message and have the physician on call contact you.   Following upper endoscopy (EGD)  Vomiting of blood or coffee ground material  New chest pain or pain under the shoulder blades  Painful or persistently difficult swallowing  New shortness of breath  Fever of 100F or higher  Black, tarry-looking stools  FOLLOW UP: If any biopsies were taken you will be contacted by phone or by letter within the next 1-3 weeks.  Call your gastroenterologist if you have not heard about the biopsies in 3 weeks.  Our staff will call the home number listed on your records the next business day following your procedure to check on you and address any questions or concerns that you may have at that time regarding the information given to you following your procedure. This is a courtesy call and so if there is no answer at the home number and we have not heard from you through the emergency physician on call, we will assume that you have returned to your regular daily activities without incident.  SIGNATURES/CONFIDENTIALITY: You and/or your care partner have signed paperwork which will be entered into your electronic medical record.  These signatures attest to the fact that that the information  above on your After Visit Summary has been reviewed and is understood.  Full responsibility of the confidentiality of this discharge information lies with you and/or your care-partner. 

## 2012-04-20 ENCOUNTER — Telehealth: Payer: Self-pay | Admitting: *Deleted

## 2012-04-20 ENCOUNTER — Telehealth: Payer: Self-pay | Admitting: Gastroenterology

## 2012-04-20 NOTE — Telephone Encounter (Signed)
  Follow up Call-  Call back number 04/19/2012  Post procedure Call Back phone  # [phone]  Permission to leave phone message Yes     Patient questions:  Do you have a fever, pain , or abdominal swelling? no Pain Score  0 *  Have you tolerated food without any problems? yes  Have you been able to return to your normal activities? yes  Do you have any questions about your discharge instructions: Diet   no Medications  no Follow up visit  no  Do you have questions or concerns about your Care? no  Actions: * If pain score is 4 or above: No action needed, pain <4.

## 2012-04-20 NOTE — Telephone Encounter (Signed)
Pt has question on how to take her Nexium.  Dr Leone Payor told her to take after meals.

## 2012-04-20 NOTE — Telephone Encounter (Signed)
Returned call to patient after verifying with Dr. Jarold Motto, he would like patient to take PPI 30 minutes before meals with a consistent meal each day of her choice. Pt reports understanding.

## 2012-04-25 ENCOUNTER — Encounter: Payer: Self-pay | Admitting: Gastroenterology

## 2012-04-25 LAB — HELICOBACTER PYLORI SCREEN-BIOPSY: UREASE: NEGATIVE

## 2012-04-30 ENCOUNTER — Other Ambulatory Visit: Payer: Self-pay | Admitting: Family Medicine

## 2012-05-23 ENCOUNTER — Encounter: Payer: Self-pay | Admitting: Gastroenterology

## 2012-05-23 ENCOUNTER — Ambulatory Visit (INDEPENDENT_AMBULATORY_CARE_PROVIDER_SITE_OTHER): Payer: BC Managed Care – PPO | Admitting: Gastroenterology

## 2012-05-23 VITALS — BP 94/60 | HR 63 | Ht 66.5 in | Wt 138.5 lb

## 2012-05-23 DIAGNOSIS — K219 Gastro-esophageal reflux disease without esophagitis: Secondary | ICD-10-CM

## 2012-05-23 DIAGNOSIS — R140 Abdominal distension (gaseous): Secondary | ICD-10-CM

## 2012-05-23 DIAGNOSIS — R109 Unspecified abdominal pain: Secondary | ICD-10-CM

## 2012-05-23 DIAGNOSIS — R141 Gas pain: Secondary | ICD-10-CM

## 2012-05-23 DIAGNOSIS — R6881 Early satiety: Secondary | ICD-10-CM

## 2012-05-23 MED ORDER — ALIGN PO CAPS: 1.0000 | ORAL_CAPSULE | Freq: Every day | ORAL | Status: DC

## 2012-05-23 NOTE — Progress Notes (Addendum)
This is a 57 year old Caucasian female who continues without abdominal gas, bloating, and early satiety.  Recent endoscopy was fairly unremarkable with negative biopsies for celiac disease, negative biopsies for Barrett's mucosa, she also had a normal upper abdominal ultrasound.  She denies dysphagia, melena, hematochezia, or any specific hepatobiliary complaints.  Biopsies for H. pylori also were negative.  Currently is on Nexium 40 mg a day, when necessary Librax, 1 mg of Klonopin daily, Zoloft 50 mg a day, Wellbutrin 300 mg a day, topical XL 12.5 mg a day, and a variety of multivitamins.  She denies a specific food intolerances is following the FOD-MAP diet for IBS.  She denies continue use of alcohol, cigarettes, or NSAIDs.  All of her symptoms have been present over the last 3-4 months, but she denies any acute viral illness before her presentation began   Current Medications, Allergies, Past Medical History, Past Surgical History, Family History and Social History were reviewed in [employer] record.  ROS: All systems were reviewed and are negative unless otherwise stated in the HPI.          Physical Exam:Blood pressure 84/60 pulse 63 and regular weight 138 with a BMI of 22.02.  I cannot appreciate stigmata of chronic liver disease.  Abdominal exam shows no distention, organomegaly, masses or tenderness.  I cannot appreciate a succussion splash.  Mental status is normal.      Assessment and Plan:Chronic biopsy-proven GERD and a patient who may have an element of delayed gastric emptying or an esophageal motility disorder.  We will continue current medications and check technetium gastric emptying scan.  Should this be unremarkable I think high-resolution manometry is in order.  I have started her on probiotic trial of  Align daily.  She's continue other medications as listed and reviewed.   Encounter Diagnoses  Name Primary?  . Abdominal pain, unspecified site Yes   . Bloating

## 2012-05-23 NOTE — Patient Instructions (Addendum)
  Samples of Nexium given today. Please take one capsule by mouth once daily.  We have given you samples of Align. This puts good bacteria back into your colon. You should take 1 capsule by mouth once daily. If this works well for you, it can be purchased over the counter. ____________________________________________________________________________________________________________________________________________________________________________________________  Shelby Grant have been scheduled for a gastric emptying scan at [facility] on 06-08-2012 at 7am. Please arrive at least 15 minutes prior to your appointment for registration. Please make certain not to have anything to eat or drink after midnight the night before your test. Hold all stomach medications (ex: Zofran, phenergan, Reglan) 48 hours prior to your test. If you need to reschedule your appointment, please contact radiology scheduling at [phone]. _____________________________________________________________________ A gastric-emptying study measures how long it takes for food to move through your stomach. There are several ways to measure stomach emptying. In the most common test, you eat food that contains a small amount of radioactive material. A scanner that detects the movement of the radioactive material is placed over your abdomen to monitor the rate at which food leaves your stomach. This test normally takes about 2 hours to complete. _____________________________________________________________________  _______________________________________________________________________________________________________________________________________________________________________                                               We are excited to introduce MyChart, a new best-in-class service that provides you online access to important information in your electronic medical record. We want to make it easier for you to view your health information - all  in one secure location - when and where you need it. We expect MyChart will enhance the quality of care and service we provide.  When you register for MyChart, you can:    View your test results.    Request appointments and receive appointment reminders via email.    Request medication renewals.    View your medical history, allergies, medications and immunizations.    Communicate with your physician's office through a password-protected site.    Conveniently print information such as your medication lists.  To find out if MyChart is right for you, please talk to a member of our clinical staff today. We will gladly answer your questions about this free health and wellness tool.  If you are age 82 or older and want a member of your family to have access to your record, you must provide written consent by completing a proxy form available at our office. Please speak to our clinical staff about guidelines regarding accounts for patients younger than age 21.  As you activate your MyChart account and need any technical assistance, please call the MyChart technical support line at [phone]-CHART [phone]) or email your question to [email or web address]. If you email your question(s), please include your name, a return phone number and the best time to reach you.  If you have non-urgent health-related questions, you can send a message to our office through MyChart at [email or web address]. If you have a medical emergency, call 911.  Thank you for using MyChart as your new health and wellness resource!   MyChart licensed from [employer],  [id number]. Patents Pending.

## 2012-06-08 ENCOUNTER — Encounter (HOSPITAL_COMMUNITY): Payer: BC Managed Care – PPO

## 2012-06-12 ENCOUNTER — Encounter (HOSPITAL_COMMUNITY)
Admission: RE | Admit: 2012-06-12 | Discharge: 2012-06-12 | Disposition: A | Discharge status: Home / Self Care | Payer: BC Managed Care – PPO | Source: Ambulatory Visit | Attending: Gastroenterology | Admitting: Gastroenterology

## 2012-06-12 ENCOUNTER — Encounter (HOSPITAL_COMMUNITY): Payer: Self-pay

## 2012-06-12 DIAGNOSIS — R141 Gas pain: Secondary | ICD-10-CM | POA: Insufficient documentation

## 2012-06-12 DIAGNOSIS — R142 Eructation: Secondary | ICD-10-CM | POA: Insufficient documentation

## 2012-06-12 DIAGNOSIS — R140 Abdominal distension (gaseous): Secondary | ICD-10-CM

## 2012-06-12 DIAGNOSIS — R109 Unspecified abdominal pain: Secondary | ICD-10-CM | POA: Insufficient documentation

## 2012-06-12 MED ORDER — TECHNETIUM TC 99M SULFUR COLLOID
2.1000 | Freq: Once | INTRAVENOUS | Status: AC | PRN
  Administered 2012-06-12: 2.1 via ORAL

## 2012-07-24 ENCOUNTER — Encounter: Payer: Self-pay | Admitting: Internal Medicine

## 2012-07-24 ENCOUNTER — Ambulatory Visit (INDEPENDENT_AMBULATORY_CARE_PROVIDER_SITE_OTHER): Payer: BC Managed Care – PPO | Admitting: Internal Medicine

## 2012-07-24 VITALS — BP 103/64 | HR 62

## 2012-07-24 DIAGNOSIS — I498 Other specified cardiac arrhythmias: Secondary | ICD-10-CM

## 2012-07-24 DIAGNOSIS — I471 Supraventricular tachycardia: Secondary | ICD-10-CM

## 2012-07-24 NOTE — Assessment & Plan Note (Signed)
No intercurrent palpitations. We'll continue her on her low dose metoprolol

## 2012-07-24 NOTE — Progress Notes (Signed)
Patient Care Team: Roderick Pee, MD as PCP - General   HPI  Shelby Grant is a 57 y.o. female Seen in followup for atrial tachycardia associated with Wenckebach periodicity.   At our last Visit we started her on metoprolol to see if she would tolerate it.  n She done titrated the dose to 12.5 mg metoprolol.she has had no intercurrent palpitations   She has pursued support for her anxiety/depression and is doing much better with this Issues with her father are also better.  She has had a problem with a hemorrhage in her eye.  Past Medical History  Diagnosis Date  . ANXIETY 06/02/2007  . BELL'S PALSY, LEFT 12/02/2008  . Supraventricular tachycardia 06/02/2007    Atrial tachycardia with Wenckebach periodicity  . ALLERGIC RHINITIS 07/03/2008  . BREAST MASS, RIGHT 06/03/2008  . IRREGULAR MENSES 06/03/2008  . CELLULITIS, LEFT LEG 09/14/2008  . CORNS AND CALLUSES 07/03/2008  . FOOT PAIN 09/22/2009  . CHEST PAIN 07/07/2009  . OCD (obsessive compulsive disorder)   . Depression     Past Surgical History  Procedure Laterality Date  . Wisdom tooth extraction      Current Outpatient Prescriptions  Medication Sig Dispense Refill  . Alfalfa TABS Take by mouth.      . Ascorbic Acid (VITAMIN C PO) Take 2 tablets by mouth daily.        . B Complex Vitamins (VITAMIN B COMPLEX PO) Take 2 tablets by mouth daily.        . bifidobacterium infantis (ALIGN) capsule Take 1 capsule by mouth daily.  14 capsule  0  . buPROPion (WELLBUTRIN XL) 300 MG 24 hr tablet Take 1 tablet (300 mg total) by mouth daily.  100 tablet  3  . Calcium-Magnesium-Vitamin D (CALCIUM MAGNESIUM PO) Take 4 tablets by mouth daily.        . clidinium-chlordiazePOXIDE (LIBRAX) 2.5-5 MG per capsule Take 1 capsule by mouth 3 (three) times daily as needed.  60 capsule  3  . clonazePAM (KLONOPIN) 1 MG tablet take 1 tablet by mouth once daily  90 tablet  1  . fish oil-omega-3 fatty acids 1000 MG capsule Take 3 g by mouth daily.      Marland Kitchen GARLIC  PO Take 2 tablets by mouth daily.       Marland Kitchen loratadine (CLARITIN) 10 MG tablet Take 10 mg by mouth as needed.       . metoprolol succinate (TOPROL-XL) 25 MG 24 hr tablet Take 0.5 tablets (12.5 mg total) by mouth daily.  50 tablet  3  . Multiple Vitamin (MULTIVITAMIN) tablet Take 2 tablets by mouth daily.        . Multiple Vitamins-Minerals (ZINC PO) Take by mouth daily.      Marland Kitchen VITAMIN D, CHOLECALCIFEROL, PO Take by mouth daily.        . vitamin E 100 UNIT capsule Take 100 Units by mouth daily.      . [DISCONTINUED] estrogen, conjugated,-medroxyprogesterone (PREMPRO) 0.625-2.5 MG per tablet Take 1 tablet by mouth daily.  28 tablet  11  . [DISCONTINUED] metoprolol tartrate (LOPRESSOR) 25 MG tablet Take 1 tablet PO prn for chest palpitations  10 tablet  0   No current facility-administered medications for this visit.    Allergies  Allergen Reactions  . Amoxicillin   . Latex     REACTION: rash    Review of Systems negative except from HPI and PMH  Physical Exam BP 103/64  Pulse 62 Well developed  and nourished in no acute distress HENT normal Neck supple with JVP-flat Clear Regular rate and rhythm, no murmurs or gallops Abd-soft with active BS No Clubbing cyanosis edema Skin-warm and dry A & Oriented  Grossly normal sensory and motor function  ECG demonstrates sinus rhythm at 62 Intervals 15/08/40 Axis LXXXV Otherwise normal1  Assessment and  Plan

## 2012-07-24 NOTE — Patient Instructions (Signed)
Your physician wants you to follow-up in: 1 year with Dr. Klein. You will receive a reminder letter in the mail two months in advance. If you don't receive a letter, please call our office to schedule the follow-up appointment.  Your physician recommends that you continue on your current medications as directed. Please refer to the Current Medication list given to you today.  

## 2012-08-21 ENCOUNTER — Telehealth: Payer: Self-pay | Admitting: Internal Medicine

## 2012-08-21 ENCOUNTER — Encounter (HOSPITAL_COMMUNITY): Payer: Self-pay

## 2012-08-21 ENCOUNTER — Emergency Department (HOSPITAL_COMMUNITY)
Admission: EM | Admit: 2012-08-21 | Discharge: 2012-08-21 | Disposition: A | Discharge status: Home / Self Care | Payer: BC Managed Care – PPO | Attending: Emergency Medicine | Admitting: Emergency Medicine

## 2012-08-21 DIAGNOSIS — Z8679 Personal history of other diseases of the circulatory system: Secondary | ICD-10-CM | POA: Insufficient documentation

## 2012-08-21 DIAGNOSIS — Z8669 Personal history of other diseases of the nervous system and sense organs: Secondary | ICD-10-CM | POA: Insufficient documentation

## 2012-08-21 DIAGNOSIS — F329 Major depressive disorder, single episode, unspecified: Secondary | ICD-10-CM | POA: Insufficient documentation

## 2012-08-21 DIAGNOSIS — F3289 Other specified depressive episodes: Secondary | ICD-10-CM | POA: Insufficient documentation

## 2012-08-21 DIAGNOSIS — Z79899 Other long term (current) drug therapy: Secondary | ICD-10-CM | POA: Insufficient documentation

## 2012-08-21 DIAGNOSIS — R002 Palpitations: Secondary | ICD-10-CM | POA: Insufficient documentation

## 2012-08-21 DIAGNOSIS — N390 Urinary tract infection, site not specified: Secondary | ICD-10-CM | POA: Insufficient documentation

## 2012-08-21 DIAGNOSIS — Z872 Personal history of diseases of the skin and subcutaneous tissue: Secondary | ICD-10-CM | POA: Insufficient documentation

## 2012-08-21 DIAGNOSIS — F411 Generalized anxiety disorder: Secondary | ICD-10-CM | POA: Insufficient documentation

## 2012-08-21 DIAGNOSIS — Z8742 Personal history of other diseases of the female genital tract: Secondary | ICD-10-CM | POA: Insufficient documentation

## 2012-08-21 DIAGNOSIS — F429 Obsessive-compulsive disorder, unspecified: Secondary | ICD-10-CM | POA: Insufficient documentation

## 2012-08-21 DIAGNOSIS — Z91040 Latex allergy status: Secondary | ICD-10-CM | POA: Insufficient documentation

## 2012-08-21 LAB — CBC WITH DIFFERENTIAL/PLATELET
Eosinophils Relative: 3 % (ref 0–5)
HCT: 37.3 % (ref 36.0–46.0)
Hemoglobin: 13.1 g/dL (ref 12.0–15.0)
Lymphocytes Relative: 23 % (ref 12–46)
Lymphs Abs: 1.6 10*3/uL (ref 0.7–4.0)
MCV: 89.7 fL (ref 78.0–100.0)
Monocytes Absolute: 0.8 10*3/uL (ref 0.1–1.0)
RBC: 4.16 MIL/uL (ref 3.87–5.11)
WBC: 7.1 10*3/uL (ref 4.0–10.5)

## 2012-08-21 LAB — BASIC METABOLIC PANEL
CO2: 28 mEq/L (ref 19–32)
Calcium: 9.2 mg/dL (ref 8.4–10.5)
Chloride: 106 mEq/L (ref 96–112)
Creatinine, Ser: 0.79 mg/dL (ref 0.50–1.10)
Glucose, Bld: 86 mg/dL (ref 70–99)

## 2012-08-21 LAB — URINALYSIS, DIPSTICK ONLY
Bilirubin Urine: NEGATIVE
Hgb urine dipstick: NEGATIVE
Ketones, ur: 15 mg/dL — AB
Nitrite: POSITIVE — AB
Urobilinogen, UA: 0.2 mg/dL (ref 0.0–1.0)
pH: 8 (ref 5.0–8.0)

## 2012-08-21 MED ORDER — CIPROFLOXACIN HCL 500 MG PO TABS: 500.0000 mg | ORAL_TABLET | Freq: Two times a day (BID) | ORAL | Status: DC

## 2012-08-21 MED ORDER — NICOTINE 21 MG/24HR TD PT24: 21.0000 mg | MEDICATED_PATCH | Freq: Once | TRANSDERMAL | Status: DC

## 2012-08-21 MED ORDER — SODIUM CHLORIDE 0.9 % IV BOLUS (SEPSIS)
1000.0000 mL | Freq: Once | INTRAVENOUS | Status: AC
  Administered 2012-08-21: 1000 mL via INTRAVENOUS

## 2012-08-21 NOTE — Telephone Encounter (Signed)
Left message for pt to call.

## 2012-08-21 NOTE — Telephone Encounter (Signed)
New Prob  Pt states that she is not feeling well.Wants to speak a doctor or nurse, she would not give any more details

## 2012-08-21 NOTE — ED Notes (Signed)
Pt was at home, felt like hr was fast.  When EMS arrived hr, 200.  enroute iv was placed as this was done pt converted to NSR.  Pt has hx of same x 2 in last 4 yrs.  No meds were given enroute here.

## 2012-08-21 NOTE — ED Provider Notes (Signed)
History    CSN: [id number] Arrival date & time 08/21/12  1319  First MD Initiated Contact with Patient 08/21/12 1339     Chief Complaint  Patient presents with  . Tachycardia   (Consider location/radiation/quality/duration/timing/severity/associated sxs/prior Treatment) HPI Comments: Shelby Grant is a 57 yo female with a PMH of SVT followed by [facility].  Her last episode of SVT was about 1.5 years ago.  She presents with complaint of her heart pounding and lightheadedness that began this AM while she was doing laundry.  She took metoprolol and waited but her symptoms persisted so she called EMS.  EMS reports her heart rate into 170s.  Shelby Grant spontaneously converted to NSR en route to the ED.  Her symptoms lasted for about 1.5-2 hours.  She denies CP/SOB.   Her and her husband returned yesterday from a week long trip to [place].  She reports having non-bloody diarrhea x 4 days that started during that trip.  She has had about 2-3 episodes of diarrhea daily and related diffuse abdominal pain.  She has not had diarrhea since yesterday and reports decreased po intake since the diarrhea began.  She has also had increased urinary frequency and dysuria x 2 days.  She denies fevers/flank pain/hematuria/N/V.     Past Medical History  Diagnosis Date  . ANXIETY 06/02/2007  . BELL'S PALSY, LEFT 12/02/2008  . Supraventricular tachycardia 06/02/2007    Atrial tachycardia with Wenckebach periodicity  . ALLERGIC RHINITIS 07/03/2008  . BREAST MASS, RIGHT 06/03/2008  . IRREGULAR MENSES 06/03/2008  . CELLULITIS, LEFT LEG 09/14/2008  . CORNS AND CALLUSES 07/03/2008  . FOOT PAIN 09/22/2009  . CHEST PAIN 07/07/2009  . OCD (obsessive compulsive disorder)   . Depression    Past Surgical History  Procedure Laterality Date  . Wisdom tooth extraction     Family History  Problem Relation Age of Onset  . Asthma Sister   . Cancer Mother     Posey Rea where cancer started    History  Substance Use Topics  . Smoking  status: Never Smoker   . Smokeless tobacco: Never Used  . Alcohol Use: Yes     Comment: social   OB History   Grav Para Term Preterm Abortions TAB SAB Ect Mult Living                 Review of Systems  Constitutional: Positive for appetite change. Negative for fever.  Respiratory: Negative for shortness of breath.   Cardiovascular: Positive for palpitations. Negative for chest pain.  Gastrointestinal: Positive for abdominal pain and diarrhea. Negative for nausea, vomiting and constipation.  Genitourinary: Positive for dysuria and frequency. Negative for hematuria and flank pain.  Neurological: Positive for light-headedness.    Allergies  Amoxicillin and Latex  Home Medications   Current Outpatient Rx  Name  Route  Sig  Dispense  Refill  . Alfalfa TABS   Oral   Take by mouth.         . Ascorbic Acid (VITAMIN C PO)   Oral   Take 2 tablets by mouth daily.           . B Complex Vitamins (VITAMIN B COMPLEX PO)   Oral   Take 2 tablets by mouth daily.           . bifidobacterium infantis (ALIGN) capsule   Oral   Take 1 capsule by mouth daily.   14 capsule   0     Samples given to  patient    Lot#    [id number]      ...   . buPROPion (WELLBUTRIN XL) 300 MG 24 hr tablet   Oral   Take 1 tablet (300 mg total) by mouth daily.   100 tablet   3   . Calcium-Magnesium-Vitamin D (CALCIUM MAGNESIUM PO)   Oral   Take 4 tablets by mouth daily.           . clidinium-chlordiazePOXIDE (LIBRAX) 2.5-5 MG per capsule   Oral   Take 1 capsule by mouth 3 (three) times daily as needed.   60 capsule   3   . clonazePAM (KLONOPIN) 1 MG tablet      take 1 tablet by mouth once daily   90 tablet   1   . fish oil-omega-3 fatty acids 1000 MG capsule   Oral   Take 3 g by mouth daily.         Marland Kitchen GARLIC PO   Oral   Take 2 tablets by mouth daily.          Marland Kitchen loratadine (CLARITIN) 10 MG tablet   Oral   Take 10 mg by mouth as needed.          . metoprolol succinate  (TOPROL-XL) 25 MG 24 hr tablet   Oral   Take 0.5 tablets (12.5 mg total) by mouth daily.   50 tablet   3   . Multiple Vitamin (MULTIVITAMIN) tablet   Oral   Take 2 tablets by mouth daily.           . Multiple Vitamins-Minerals (ZINC PO)   Oral   Take by mouth daily.         Marland Kitchen VITAMIN D, CHOLECALCIFEROL, PO   Oral   Take by mouth daily.           . vitamin E 100 UNIT capsule   Oral   Take 100 Units by mouth daily.          BP 103/77  Pulse 82  Temp(Src) 98 F (36.7 C) (Oral)  Resp 16  SpO2 99%  LMP 08/01/2012 Physical Exam  Constitutional: She is oriented to person, place, and time. She appears well-developed and well-nourished. No distress.  HENT:  Mouth/Throat: Oropharynx is clear and moist.  Eyes: Pupils are equal, round, and reactive to light.  Neck: Neck supple.  Cardiovascular: Normal rate, regular rhythm, normal heart sounds and intact distal pulses.   Pulmonary/Chest: Effort normal and breath sounds normal.  Abdominal: Soft. Bowel sounds are normal. She exhibits no distension. There is no tenderness. There is no guarding.  Musculoskeletal: She exhibits no edema.  Neurological: She is alert and oriented to person, place, and time.  Skin: She is not diaphoretic.  Psychiatric: She has a normal mood and affect. Her behavior is normal.    ED Course  Procedures (including critical care time) Labs Reviewed  URINE CULTURE  CBC WITH DIFFERENTIAL  BASIC METABOLIC PANEL  URINALYSIS, DIPSTICK ONLY   No results found. No diagnosis found.  MDM  Shelby Grant is a 57 yo woman with a hx of SVT followed by the [facility] presenting tachycardia.    Her symptoms have resolved prior to arrival in the ED.   Tachycardia was likely related to dehydration, stress of recent trip.  She received a bolus of fluids in the ED.  No electrolyte abnormalities or elevated WBC.  Awaiting results of UA as pt with UTI symptoms.  Evelena Peat, DO 08/21/12 1513

## 2012-08-21 NOTE — ED Provider Notes (Signed)
I saw and evaluated the patient, reviewed the resident's note and I agree with the findings and plan. The patient presents with complaints of tachycardia, palpitations.  She has a history of svt and recently returned from [place] yesterday.  This episode occurred just prior to arrival and lasted for about one hour.  911 was called but her symptoms resolved prior to them arriving.  She feels well now.  She does report some loose stools and urinary frequency and dysuria, but otherwise has no complaints.   On exam, the vitals are stable and the patient is afebrile.  The heart is regular rate and rhythm and the lungs are clear.  The abdomen is soft, non-tender.  There is no edema.    The patient presents with complaints of recurrent palpitations, likely svt as this has happened to her in the past.  It resolved prior to arrival and she is now asymptomatic.  The workup reveals normal labs, but urine is suggestive of a uti.  Will treat with cipro, to follow up prn.  Geoffery Lyons, MD 08/21/12 [phone]

## 2012-08-21 NOTE — ED Notes (Signed)
Report to [place], rn

## 2012-08-21 NOTE — ED Notes (Signed)
Pt ambulatory to bathroom without difficulty. Urine sample obtained without difficulty. Sent to lab

## 2012-08-22 MED ORDER — METOPROLOL TARTRATE 50 MG PO TABS: ORAL_TABLET | ORAL | Status: DC

## 2012-08-22 NOTE — Telephone Encounter (Signed)
Discussed with dr Graciela Husbands, lopressor increased to 50 mg as needed for the SVT. Vagal maneuvers also discussed.

## 2012-08-22 NOTE — Telephone Encounter (Signed)
Spoke with pt, she had an episode of SVT yesterday, the first in about two years. It lasted about one hour and she ended up going to the ER. She tried taking the metoprolol tartrate she has for these episodes but it did not help. The metoprolol had expired in 01-2012. She wanted to know if the metoprolol tartrate could be adjusted as to prevent her from having to go to the ER. She takes metoprolol succ 12.5 mg daily. Will discuss with dr Graciela Husbands.

## 2012-08-23 LAB — URINE CULTURE

## 2012-08-24 NOTE — ED Notes (Signed)
+   urine No treatment needed per Lisa Sanders 

## 2012-08-26 ENCOUNTER — Encounter: Payer: Self-pay | Admitting: Family Medicine

## 2012-08-26 ENCOUNTER — Ambulatory Visit: Payer: BC Managed Care – PPO | Admitting: Family Medicine

## 2012-08-26 VITALS — BP 120/82 | HR 60 | Temp 97.6°F | Resp 16 | Ht 67.0 in | Wt 138.0 lb

## 2012-08-26 DIAGNOSIS — R42 Dizziness and giddiness: Secondary | ICD-10-CM

## 2012-08-26 DIAGNOSIS — E860 Dehydration: Secondary | ICD-10-CM

## 2012-08-26 DIAGNOSIS — N390 Urinary tract infection, site not specified: Secondary | ICD-10-CM

## 2012-08-26 DIAGNOSIS — R110 Nausea: Secondary | ICD-10-CM

## 2012-08-26 DIAGNOSIS — R109 Unspecified abdominal pain: Secondary | ICD-10-CM

## 2012-08-26 LAB — COMPREHENSIVE METABOLIC PANEL
AST: 12 U/L (ref 0–37)
Albumin: 4.2 g/dL (ref 3.5–5.2)
BUN: 20 mg/dL (ref 6–23)
CO2: 26 mEq/L (ref 19–32)
Calcium: 9.4 mg/dL (ref 8.4–10.5)
Chloride: 105 mEq/L (ref 96–112)
Creat: 1.05 mg/dL (ref 0.50–1.10)
Potassium: 3.7 mEq/L (ref 3.5–5.3)

## 2012-08-26 LAB — POCT CBC
HCT, POC: 43.3 % (ref 37.7–47.9)
Hemoglobin: 13.8 g/dL (ref 12.2–16.2)
Lymph, poc: 1.9 (ref 0.6–3.4)
MCH, POC: 30.8 pg (ref 27–31.2)
MCHC: 31.9 g/dL (ref 31.8–35.4)
MCV: 96.6 fL (ref 80–97)
RBC: 4.48 M/uL (ref 4.04–5.48)
WBC: 6.2 10*3/uL (ref 4.6–10.2)

## 2012-08-26 LAB — POCT UA - MICROSCOPIC ONLY
Bacteria, U Microscopic: NEGATIVE
Casts, Ur, LPF, POC: NEGATIVE
Crystals, Ur, HPF, POC: NEGATIVE
Mucus, UA: NEGATIVE
RBC, urine, microscopic: NEGATIVE
Yeast, UA: NEGATIVE

## 2012-08-26 LAB — POCT URINALYSIS DIPSTICK
Bilirubin, UA: NEGATIVE
Blood, UA: NEGATIVE
Glucose, UA: NEGATIVE
Ketones, UA: NEGATIVE
Leukocytes, UA: NEGATIVE
Nitrite, UA: NEGATIVE
Protein, UA: NEGATIVE
Spec Grav, UA: 1.015
Urobilinogen, UA: 0.2
pH, UA: 6.5

## 2012-08-26 LAB — TSH: TSH: 1.409 u[IU]/mL (ref 0.350–4.500)

## 2012-08-26 NOTE — Progress Notes (Signed)
[address]   [place], [place]  [address]   [phone]  Subjective:    Patient ID: Shelby Grant, female    DOB: 1955/11/23, 57 y.o.   MRN: [id number]  HPI This 57 y.o. female presents for evaluation of dizziness.   At a church event and eating; started feeling nauseated and developed dizzinness; started feeling really weak.  Knew would fall if walked.  Sister noticed that patient not acting right.  As time going along, started feeling tingling and fatigued; hardly hold self up.  Decided to send to UC.  Drinking a lot of fluids.  Really hot today; in a trailer open; doing paperwork.  Did eat a good breakfast, drank coffee, tea, water.   Eating lunch with onset.  Recnently diagnosed with UTI; took last one today; started five days ago.  At hospital movement, hsa SVT; had episode on Monday.  No tachycardia today.  No fever/chills/sweats; was really clammy and hot.  Was getting really hot in trailer.  Pale.  Eyes not good.  No syncope.  Slight headache.  No blurred vision; no chest pain. Just returned from vacation; picked up stomach bug.  No diarrhea in three days.  NO further dysuria, frequency, urgency.  No SOB but labored breathing.  No focal weakness; no confusion.  Dizziness still present.     Review of Systems  Constitutional: Positive for diaphoresis and fatigue. Negative for fever and chills.  HENT: Negative for tinnitus.   Respiratory: Positive for shortness of breath. Negative for wheezing and stridor.   Cardiovascular: Negative for chest pain, palpitations and leg swelling.  Gastrointestinal: Positive for nausea. Negative for vomiting, abdominal pain and diarrhea.  Endocrine: Positive for heat intolerance. Negative for cold intolerance, polydipsia, polyphagia and polyuria.  Genitourinary: Negative for dysuria, urgency, frequency and decreased urine volume.  Skin: Negative for rash.  Neurological: Positive for dizziness, light-headedness and headaches. Negative for tremors, seizures,  syncope, facial asymmetry, speech difficulty, weakness and numbness.  Psychiatric/Behavioral: Negative for confusion.    Past Medical History  Diagnosis Date  . ANXIETY 06/02/2007  . BELL'S PALSY, LEFT 12/02/2008  . Supraventricular tachycardia 06/02/2007    Atrial tachycardia with Wenckebach periodicity  . ALLERGIC RHINITIS 07/03/2008  . BREAST MASS, RIGHT 06/03/2008  . IRREGULAR MENSES 06/03/2008  . CELLULITIS, LEFT LEG 09/14/2008  . CORNS AND CALLUSES 07/03/2008  . FOOT PAIN 09/22/2009  . CHEST PAIN 07/07/2009  . OCD (obsessive compulsive disorder)   . Depression     Past Surgical History  Procedure Laterality Date  . Wisdom tooth extraction      Prior to Admission medications   Medication Sig Start Date End Date Taking? Authorizing Provider  Alfalfa TABS Take by mouth.    Historical Provider, MD  Ascorbic Acid (VITAMIN C PO) Take 2 tablets by mouth daily.      Historical Provider, MD  B Complex Vitamins (VITAMIN B COMPLEX PO) Take 2 tablets by mouth daily.      Historical Provider, MD  bifidobacterium infantis (ALIGN) capsule Take 1 capsule by mouth daily. 05/23/12   Mardella Layman, MD  buPROPion (WELLBUTRIN XL) 300 MG 24 hr tablet Take 1 tablet (300 mg total) by mouth daily. 08/31/11 08/30/12  Roderick Pee, MD  Calcium-Magnesium-Vitamin D (CALCIUM MAGNESIUM PO) Take 4 tablets by mouth daily.      Historical Provider, MD  ciprofloxacin (CIPRO) 500 MG tablet Take 1 tablet (500 mg total) by mouth 2 (two) times daily. 08/21/12   Geoffery Lyons,  MD  clonazePAM (KLONOPIN) 1 MG tablet take 1 tablet by mouth once daily 04/30/12   Roderick Pee, MD  fish oil-omega-3 fatty acids 1000 MG capsule Take 3 g by mouth daily.    Historical Provider, MD  GARLIC PO Take 2 tablets by mouth daily.     Historical Provider, MD  metoprolol succinate (TOPROL-XL) 25 MG 24 hr tablet Take 0.5 tablets (12.5 mg total) by mouth daily. 08/31/11 08/30/12  Roderick Pee, MD  metoprolol tartrate (LOPRESSOR) 50 MG tablet One  tablet as needed for SVT 08/22/12   Duke Salvia, MD  Multiple Vitamin (MULTIVITAMIN) tablet Take 2 tablets by mouth daily.      Historical Provider, MD  Multiple Vitamins-Minerals (ZINC PO) Take by mouth daily.    Historical Provider, MD  TURMERIC PO Take 1 tablet by mouth daily.    Historical Provider, MD  VITAMIN D, CHOLECALCIFEROL, PO Take by mouth daily.      Historical Provider, MD  vitamin E 100 UNIT capsule Take 100 Units by mouth daily.    Historical Provider, MD    Allergies  Allergen Reactions  . Amoxicillin     Unknown  . Latex     REACTION: rash    History   Social History  . Marital Status: Married    Spouse Name: N/A    Number of Children: N/A  . Years of Education: N/A   Occupational History  . Business Owner     Social History Main Topics  . Smoking status: Never Smoker   . Smokeless tobacco: Never Used  . Alcohol Use: Yes     Comment: social  . Drug Use: No  . Sexually Active: Yes     Comment: married   Other Topics Concern  . Not on file   Social History Narrative   No caffeine     Family History  Problem Relation Age of Onset  . Asthma Sister   . Cancer Mother     Posey Rea where cancer started        Objective:   Physical Exam  Nursing note and vitals reviewed. Constitutional: She is oriented to person, place, and time. She appears well-developed and well-nourished. No distress.  HENT:  Head: Normocephalic and atraumatic.  Right Ear: External ear normal.  Left Ear: External ear normal.  Nose: Nose normal.  Mouth/Throat: Oropharynx is clear and moist.  Eyes: Conjunctivae and EOM are normal. Pupils are equal, round, and reactive to light.  Neck: Normal range of motion. Neck supple. No thyromegaly present.  Cardiovascular: Normal rate, regular rhythm and normal heart sounds.  Exam reveals no gallop and no friction rub.   No murmur heard. Pulmonary/Chest: Effort normal and breath sounds normal. She has no wheezes. She has no rales.    Abdominal: Soft. Bowel sounds are normal. She exhibits no distension and no mass. There is no tenderness. There is no rebound and no guarding.  Lymphadenopathy:    She has no cervical adenopathy.  Neurological: She is alert and oriented to person, place, and time. She has normal reflexes. No cranial nerve deficit. She exhibits normal muscle tone. Coordination normal.  Skin: Skin is warm and dry. No rash noted. She is not diaphoretic.  Psychiatric: She has a normal mood and affect. Her behavior is normal. Judgment and thought content normal.   EKG:  NSR rate of 63; no acute ST changes.  Results for orders placed in visit on 08/26/12  POCT UA - MICROSCOPIC ONLY  Result Value Range   WBC, Ur, HPF, POC 0-1     RBC, urine, microscopic neg     Bacteria, U Microscopic neg     Mucus, UA neg     Epithelial cells, urine per micros 0-3     Crystals, Ur, HPF, POC neg     Casts, Ur, LPF, POC neg     Yeast, UA neg    POCT URINALYSIS DIPSTICK      Result Value Range   Color, UA yellow     Clarity, UA clear     Glucose, UA neg     Bilirubin, UA neg     Ketones, UA neg     Spec Grav, UA 1.015     Blood, UA neg     pH, UA 6.5     Protein, UA neg     Urobilinogen, UA 0.2     Nitrite, UA neg     Leukocytes, UA Negative    POCT CBC      Result Value Range   WBC 6.2  4.6 - 10.2 K/uL   Lymph, poc 1.9  0.6 - 3.4   POC LYMPH PERCENT 30.0  10 - 50 %L   MID (cbc) 0.6  0 - 0.9   POC MID % 9.8  0 - 12 %M   POC Granulocyte 3.7  2 - 6.9   Granulocyte percent 60.2  37 - 80 %G   RBC 4.48  4.04 - 5.48 M/uL   Hemoglobin 13.8  12.2 - 16.2 g/dL   HCT, POC 16.1  09.6 - 47.9 %   MCV 96.6  80 - 97 fL   MCH, POC 30.8  27 - 31.2 pg   MCHC 31.9  31.8 - 35.4 g/dL   RDW, POC 04.5     Platelet Count, POC 295  142 - 424 K/uL   MPV 8.6  0 - 99.8 fL  GLUCOSE, POCT (MANUAL RESULT ENTRY)      Result Value Range   POC Glucose 162 (*) 70 - 99 mg/dl        Assessment & Plan:  Abdominal  pain, other  specified site - Plan: POCT UA - Microscopic Only, POCT urinalysis dipstick  Nausea alone - Plan: POCT CBC, POCT glucose (manual entry), Comprehensive metabolic panel, TSH  Dizziness and giddiness - Plan: EKG 12-Lead  UTI (urinary tract infection)  Dehydration  1. Dehydration:  New.  Drank 20 ounces of fluid in office with improvement of dizziness, malaise. Recommend aggressive rehydration throughout the day in addition to rest and remain in cool environment.  RTC for inability to drink fluids aggressively. 2.  Nausea:  New.  Obtain labs; [employer]  Recent GI illness.  Benign abdominal exam. 3.  Dizziness:  New.  Onset with heat exposure, recent GI illness. Improved with rehydration orally in office. Obtain labs. Neurologically intact. No suggestion of vertigo. RTC for acute worsening.  4.  UTI: resolved; s/p treatment for UTI; symptoms resolved; urine negative.

## 2012-09-12 ENCOUNTER — Ambulatory Visit (INDEPENDENT_AMBULATORY_CARE_PROVIDER_SITE_OTHER): Payer: BC Managed Care – PPO | Admitting: Family Medicine

## 2012-09-12 VITALS — BP 100/74 | HR 77 | Temp 98.4°F | Resp 18 | Wt 132.0 lb

## 2012-09-12 DIAGNOSIS — M7712 Lateral epicondylitis, left elbow: Secondary | ICD-10-CM

## 2012-09-12 DIAGNOSIS — M7710 Lateral epicondylitis, unspecified elbow: Secondary | ICD-10-CM

## 2012-09-12 MED ORDER — PREDNISONE 20 MG PO TABS: ORAL_TABLET | ORAL | Status: DC

## 2012-09-12 NOTE — Progress Notes (Signed)
Progressive left lateral epicondyle pain and swelling over the last several weeks. Patient notices that whenever she bumps the prominent bone. She has no significant trauma history.  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 She has no unusual or new activities.  Objective: Tender, prominent left lateral epicondyle full elbow range of motion. There is no ecchymosis or skin break.  Assessment: Left lateral epicondylitis  Plan:Lateral epicondylitis, left - Plan: predniSONE (DELTASONE) 20 MG tablet Ace wrap, return in 3 days if pain is not rapidly improving  Signed, Elvina Sidle, MD

## 2012-09-12 NOTE — Patient Instructions (Signed)
Lateral Epicondylitis (Tennis Elbow) with Rehab Lateral epicondylitis involves inflammation and pain around the outer portion of the elbow. The pain is caused by inflammation of the tendons in the forearm that bring back (extend) the wrist. Lateral epicondylittis is also called tennis elbow, because it is very common in tennis players. However, it may occur in any individual who extends the wrist repetitively. If lateral epicondylitis is left untreated, it may become a chronic problem. SYMPTOMS   Pain, tenderness, and inflammation on the outer (lateral) side of the elbow.  Pain or weakness with gripping activities.  Pain that increases with wrist twisting motions (playing tennis, using a screwdriver, opening a door or a jar).  Pain with lifting objects, including a coffee cup. CAUSES  Lateral epicondylitis is caused by inflammation of the tendons that extend the wrist. Causes of injury may include:  Repetitive stress and strain on the muscles and tendons that extend the wrist.  Sudden change in activity level or intensity.  Incorrect grip in racquet sports.  Incorrect grip size of racquet (often too large).  Incorrect hitting position or technique (usually backhand, leading with the elbow).  Using a racket that is too heavy. RISK INCREASES WITH:  Sports or occupations that require repetitive and/or strenuous forearm and wrist movements (tennis, squash, racquetball, carpentry).  Poor wrist and forearm strength and flexibility.  Failure to warm up properly before activity.  Resuming activity before healing, rehabilitation, and conditioning are complete. PREVENTION   Warm up and stretch properly before activity.  Maintain physical fitness:  Strength, flexibility, and endurance.  Cardiovascular fitness.  Wear and use properly fitted equipment.  Learn and use proper technique and have a coach correct improper technique.  Wear a tennis elbow (counterforce) brace. PROGNOSIS   The course of this condition depends on the degree of the injury. If treated properly, acute cases (symptoms lasting less than 4 weeks) are often resolved in 2 to 6 weeks. Chronic (longer lasting cases) often resolve in 3 to 6 months, but may require physical therapy. RELATED COMPLICATIONS   Frequently recurring symptoms, resulting in a chronic problem. Properly treating the problem the first time decreases frequency of recurrence.  Chronic inflammation, scarring tendon degeneration, and partial tendon tear, requiring surgery.  Delayed healing or resolution of symptoms. TREATMENT  Treatment first involves the use of ice and medicine, to reduce pain and inflammation. Strengthening and stretching exercises may help reduce discomfort, if performed regularly. These exercises may be performed at home, if the condition is an acute injury. Chronic cases may require a referral to a physical therapist for evaluation and treatment. Your caregiver may advise a corticosteroid injection, to help reduce inflammation. Rarely, surgery is needed. MEDICATION  If pain medicine is needed, nonsteroidal anti-inflammatory medicines (aspirin and ibuprofen), or other minor pain relievers (acetaminophen), are often advised.  Do not take pain medicine for 7 days before surgery.  Prescription pain relievers may be given, if your caregiver thinks they are needed. Use only as directed and only as much as you need.  Corticosteroid injections may be recommended. These injections should be reserved only for the most severe cases, because they can only be given a certain number of times. HEAT AND COLD  Cold treatment (icing) should be applied for 10 to 15 minutes every 2 to 3 hours for inflammation and pain, and immediately after activity that aggravates your symptoms. Use ice packs or an ice massage.  Heat treatment may be used before performing stretching and strengthening activities prescribed by your   caregiver, physical  therapist, or athletic trainer. Use a heat pack or a warm water soak. SEEK MEDICAL CARE IF: Symptoms get worse or do not improve in 2 weeks, despite treatment. EXERCISES  RANGE OF MOTION (ROM) AND STRETCHING EXERCISES - Epicondylitis, Lateral (Tennis Elbow) These exercises may help you when beginning to rehabilitate your injury. Your symptoms may go away with or without further involvement from your physician, physical therapist or athletic trainer. While completing these exercises, remember:   Restoring tissue flexibility helps normal motion to return to the joints. This allows healthier, less painful movement and activity.  An effective stretch should be held for at least 30 seconds.  A stretch should never be painful. You should only feel a gentle lengthening or release in the stretched tissue. RANGE OF MOTION  Wrist Flexion, Active-Assisted  Extend your right / left elbow with your fingers pointing down.*  Gently pull the back of your hand towards you, until you feel a gentle stretch on the top of your forearm.  Hold this position for __________ seconds. Repeat __________ times. Complete this exercise __________ times per day.  *If directed by your physician, physical therapist or athletic trainer, complete this stretch with your elbow bent, rather than extended. RANGE OF MOTION  Wrist Extension, Active-Assisted  Extend your right / left elbow and turn your palm upwards.*  Gently pull your palm and fingertips back, so your wrist extends and your fingers point more toward the ground.  You should feel a gentle stretch on the inside of your forearm.  Hold this position for __________ seconds. Repeat __________ times. Complete this exercise __________ times per day. *If directed by your physician, physical therapist or athletic trainer, complete this stretch with your elbow bent, rather than extended. STRETCH - Wrist Flexion  Place the back of your right / left hand on a tabletop,  leaving your elbow slightly bent. Your fingers should point away from your body.  Gently press the back of your hand down onto the table by straightening your elbow. You should feel a stretch on the top of your forearm.  Hold this position for __________ seconds. Repeat __________ times. Complete this stretch __________ times per day.  STRETCH  Wrist Extension   Place your right / left fingertips on a tabletop, leaving your elbow slightly bent. Your fingers should point backwards.  Gently press your fingers and palm down onto the table by straightening your elbow. You should feel a stretch on the inside of your forearm.  Hold this position for __________ seconds. Repeat __________ times. Complete this stretch __________ times per day.  STRENGTHENING EXERCISES - Epicondylitis, Lateral (Tennis Elbow) These exercises may help you when beginning to rehabilitate your injury. They may resolve your symptoms with or without further involvement from your physician, physical therapist or athletic trainer. While completing these exercises, remember:   Muscles can gain both the endurance and the strength needed for everyday activities through controlled exercises.  Complete these exercises as instructed by your physician, physical therapist or athletic trainer. Increase the resistance and repetitions only as guided.  You may experience muscle soreness or fatigue, but the pain or discomfort you are trying to eliminate should never worsen during these exercises. If this pain does get worse, stop and make sure you are following the directions exactly. If the pain is still present after adjustments, discontinue the exercise until you can discuss the trouble with your caregiver. STRENGTH Wrist Flexors  Sit with your right / left forearm palm-up and   fully supported on a table or countertop. Your elbow should be resting below the height of your shoulder. Allow your wrist to extend over the edge of the  surface.  Loosely holding a __________ weight, or a piece of rubber exercise band or tubing, slowly curl your hand up toward your forearm.  Hold this position for __________ seconds. Slowly lower the wrist back to the starting position in a controlled manner. Repeat __________ times. Complete this exercise __________ times per day.  STRENGTH  Wrist Extensors  Sit with your right / left forearm palm-down and fully supported on a table or countertop. Your elbow should be resting below the height of your shoulder. Allow your wrist to extend over the edge of the surface.  Loosely holding a __________ weight, or a piece of rubber exercise band or tubing, slowly curl your hand up toward your forearm.  Hold this position for __________ seconds. Slowly lower the wrist back to the starting position in a controlled manner. Repeat __________ times. Complete this exercise __________ times per day.  STRENGTH - Ulnar Deviators  Stand with a ____________________ weight in your right / left hand, or sit while holding a rubber exercise band or tubing, with your healthy arm supported on a table or countertop.  Move your wrist, so that your pinkie travels toward your forearm and your thumb moves away from your forearm.  Hold this position for __________ seconds and then slowly lower the wrist back to the starting position. Repeat __________ times. Complete this exercise __________ times per day STRENGTH - Radial Deviators  Stand with a ____________________ weight in your right / left hand, or sit while holding a rubber exercise band or tubing, with your injured arm supported on a table or countertop.  Raise your hand upward in front of you or pull up on the rubber tubing.  Hold this position for __________ seconds and then slowly lower the wrist back to the starting position. Repeat __________ times. Complete this exercise __________ times per day. STRENGTH  Forearm Supinators   Sit with your right /  left forearm supported on a table, keeping your elbow below shoulder height. Rest your hand over the edge, palm down.  Gently grip a hammer or a soup ladle.  Without moving your elbow, slowly turn your palm and hand upward to a "thumbs-up" position.  Hold this position for __________ seconds. Slowly return to the starting position. Repeat __________ times. Complete this exercise __________ times per day.  STRENGTH  Forearm Pronators   Sit with your right / left forearm supported on a table, keeping your elbow below shoulder height. Rest your hand over the edge, palm up.  Gently grip a hammer or a soup ladle.  Without moving your elbow, slowly turn your palm and hand upward to a "thumbs-up" position.  Hold this position for __________ seconds. Slowly return to the starting position. Repeat __________ times. Complete this exercise __________ times per day.  STRENGTH - Grip  Grasp a tennis ball, a dense sponge, or a large, rolled sock in your hand.  Squeeze as hard as you can, without increasing any pain.  Hold this position for __________ seconds. Release your grip slowly. Repeat __________ times. Complete this exercise __________ times per day.  STRENGTH - Elbow Extensors, Isometric  Stand or sit upright, on a firm surface. Place your right / left arm so that your palm faces your stomach, and it is at the height of your waist.  Place your opposite hand on   the underside of your forearm. Gently push up as your right / left arm resists. Push as hard as you can with both arms, without causing any pain or movement at your right / left elbow. Hold this stationary position for __________ seconds. Gradually release the tension in both arms. Allow your muscles to relax completely before repeating. Document Released: 01/18/2005 Document Revised: 04/12/2011 Document Reviewed: 05/02/2008 [employer] Patient Information 2014 [employer].  

## 2012-09-20 ENCOUNTER — Ambulatory Visit (INDEPENDENT_AMBULATORY_CARE_PROVIDER_SITE_OTHER): Payer: BC Managed Care – PPO | Admitting: Family Medicine

## 2012-09-20 VITALS — BP 108/66 | HR 66 | Temp 98.8°F | Resp 18 | Ht 66.5 in | Wt 132.0 lb

## 2012-09-20 DIAGNOSIS — G5601 Carpal tunnel syndrome, right upper limb: Secondary | ICD-10-CM

## 2012-09-20 DIAGNOSIS — R202 Paresthesia of skin: Secondary | ICD-10-CM

## 2012-09-20 DIAGNOSIS — G5600 Carpal tunnel syndrome, unspecified upper limb: Secondary | ICD-10-CM

## 2012-09-20 DIAGNOSIS — R209 Unspecified disturbances of skin sensation: Secondary | ICD-10-CM

## 2012-09-20 MED ORDER — MELOXICAM 15 MG PO TABS: 15.0000 mg | ORAL_TABLET | Freq: Every day | ORAL | Status: DC

## 2012-09-20 NOTE — Patient Instructions (Addendum)
Carpal Tunnel Syndrome Carpal tunnel syndrome is a disorder of the nervous system in the wrist that causes pain, hand weakness, and/or loss of feeling. Carpal tunnel syndrome is caused by the compression, stretching, or irritation of the median nerve at the wrist joint. Athletes who experience carpal tunnel syndrome may notice a decrease in their performance to the condition, especially for sports that require strong hand or wrist action.  SYMPTOMS   Tingling, numbness, or burning pain in the hand or fingers.  Inability to sleep due to pain in the hand.  Sharp pains that shoot from the wrist up the arm or to the fingers, especially at night.  Morning stiffness or cramping of the hand.  Thumb weakness, resulting in difficulty holding objects or making a fist.  Shiny, dry skin on the hand.  Reduced performance in any sport requiring a strong grip. CAUSES   Median nerve damage at the wrist is caused by pressure due to swelling, inflammation, or scarred tissue.  Sources of pressure include:  Repetitive gripping or squeezing that causes inflammation of the tendon sheaths.  Scarring or shortening of the ligament that covers the median nerve.  Traumatic injury to the wrist or forearm such as fracture, sprain, or dislocation.  Prolonged hyperextension (wrist bent backward) or hyperflexion (wrist bent downward) of the wrist. RISK INCREASES WITH:  Diabetes mellitus.  Menopause or amenorrhea.  Rheumatoid arthritis.  Raynaud's disease.  Pregnancy.  Gout.  Kidney disease.  Ganglion cyst.  Repetitive hand or wrist action.  Hypothyroidism (underactive thyroid gland).  Repetitive jolting or shaking of the hands or wrist.  Prolonged forceful weight-bearing on the hands. PREVENTION  Bracing the hand and wrist straight during activities that involve repetitive grasping.  For activities that require prolonged extension of the wrist (bending towards the top of the forearm)  periodically change the position of your wrists.  Learn and use proper technique in activities that result in the wrist position in neutral to slight extension.  Avoid bending the wrist into full extension or flexion (up or down)  Keep the wrist in a straight (neutral) position. To keep the wrist in this position, wear a splint.  Avoid repetitive hand and wrist motions.  When possible avoid prolonged grasping of items (steering wheel of a car, a pen, a vacuum cleaner, or a rake).  Loosen your grip for activities that require prolonged grasping of items.  Place keyboards and writing surfaces at the correct height as to decrease strain on the wrist and hand.  Alternate work tasks to avoid prolonged wrist flexion.  Avoid pinching activities (needlework and writing) as they may irritate your carpal tunnel syndrome.  If these activities are necessary, complete them for shorter periods of time.  When writing, use a felt tip or roller ball pen and/or build up the grip on a pen to decrease the forces required for writing. PROGNOSIS  Carpal tunnel syndrome is usually curable with appropriate conservative treatment and sometimes resolves spontaneously. For some cases, surgery is necessary, especially if muscle wasting or nerve changes have developed.  RELATED COMPLICATIONS   Permanent numbness and a weak thumb or fingers in the affected hand.  Permanent paralysis of a portion of the hand and fingers. TREATMENT  Treatment initially consists of stopping activities that aggravate the symptoms as well as medication and ice to reduce inflammation. A wrist splint is often recommended for wear during activities of repetitive motion as well as at night. It is also important to learn and use proper technique   when performing activities that typically cause pain. On occasion, a corticosteroid injection may be given. If symptoms persist despite conservative treatment, surgery may be an option. Surgical  techniques free the pinched or compressed nerve. Carpal tunnel surgery is usually performed on an outpatient basis, meaning you go home the same day as surgery. These procedures provide almost complete relief of all symptoms in 95% of patients. Expect at least 2 weeks for healing after surgery. For cases that are the result of repeated jolting or shaking of the hand or wrist or prolonged hyperextension, surgery is not usually recommended, because stretching of the median nerve and not compression are usually the cause of carpal tunnel syndrome in these cases. MEDICATION   If pain medication is necessary, nonsteroidal anti-inflammatory medications, such as aspirin and ibuprofen, or other minor pain relievers, such as acetaminophen, are often recommended.  Do not take pain medication for 7 days before surgery.  Prescription pain relievers are usually only prescribed after surgery. Use only as directed and only as much as you need.  Corticosteroid injections may be given to reduce inflammation. However, they are not always recommended.  Vitamin B6 (pyridoxine) may reduce symptoms; use only if prescribed for your disorder. SEEK MEDICAL CARE IF:   Symptoms get worse or do not improve in 2 weeks despite treatment.  You also have a current or recent history of neck or shoulder injury that has resulted in pain or tingling elsewhere in your arm. Document Released: 01/18/2005 Document Revised: 04/12/2011 Document Reviewed: 05/02/2008 [facility] Patient Information 2014 [place], [place].

## 2012-09-20 NOTE — Progress Notes (Signed)
   [address]   [place], [place]  [address]   [phone]  Subjective:    Patient ID: Shelby Grant, female    DOB: 23-Jul-1955, 57 y.o.   MRN: [id number]  HPI This 57 y.o. female presents for evaluation of tingling, numbness in R fingertips.  Tingling really bad yesterday.  Pain in radial aspect of wrist.  2nd-4th fingers tingle mostly.  Usually 3rd and 4th fingers tingle.   R second finger hurts.  Onset several months; worsening; more frequent; certain things worsen symptoms; if putting on make up fingers will tingle; +nighttime awakening.  Using L hand more.  R hand dominant. Today pain improved.  Numbness in fingers.  Nighttime symptoms almost every other night.  +Weakness with grabbing coffee cup  To squeeze towel, feels weak.   .  Evaluated last week by Dr. Elbert Ewings; diagnosed with L lateral epicondylitis.    Review of Systems  Musculoskeletal: Positive for myalgias. Negative for back pain, joint swelling and arthralgias.  Neurological: Positive for weakness and numbness.       Objective:   Physical Exam  Nursing note and vitals reviewed. Constitutional: She is oriented to person, place, and time. She appears well-developed and well-nourished. No distress.  Eyes: Conjunctivae and EOM are normal. Pupils are equal, round, and reactive to light.  Cardiovascular: Normal rate and regular rhythm.   Pulmonary/Chest: Effort normal and breath sounds normal.  Musculoskeletal:       Right shoulder: Normal.       Right elbow: Normal.      Right wrist: Normal.       Cervical back: Normal.       Right hand: Normal. Normal sensation noted. Normal strength noted. She exhibits no finger abduction, no thumb/finger opposition and no wrist extension trouble.  Tinel and Phalen's equivocal.   Neurological: She is alert and oriented to person, place, and time. She has normal reflexes. No cranial nerve deficit. She exhibits normal muscle tone. Coordination normal.  Skin: Skin is warm and dry. No rash noted.  She is not diaphoretic. No erythema.  Psychiatric: She has a normal mood and affect. Her behavior is normal.       Assessment & Plan:  Paresthesias in right hand  Carpal tunnel syndrome, right  1. Paresthesias R hand: New. Secondary to CTS.   2.  CTS Right:  New.  Rx for Mobic provided.  Wrist splint qhs for 2-4 weeks and during the day with a lot of activity; passive range of motion at night.  Ice qhs for two weeks. If no improvement in one month, contact office for NCS.  Meds ordered this encounter  Medications  . meloxicam (MOBIC) 15 MG tablet    Sig: Take 1 tablet (15 mg total) by mouth daily.    Dispense:  30 tablet    Refill:  0

## 2012-09-21 ENCOUNTER — Other Ambulatory Visit: Payer: Self-pay | Admitting: Family Medicine

## 2012-10-06 ENCOUNTER — Other Ambulatory Visit (INDEPENDENT_AMBULATORY_CARE_PROVIDER_SITE_OTHER): Payer: BC Managed Care – PPO

## 2012-10-06 DIAGNOSIS — R7989 Other specified abnormal findings of blood chemistry: Secondary | ICD-10-CM

## 2012-10-06 DIAGNOSIS — Z0000 Encounter for general adult medical examination without abnormal findings: Secondary | ICD-10-CM

## 2012-10-06 LAB — CBC WITH DIFFERENTIAL/PLATELET
Basophils Absolute: 0 10*3/uL (ref 0.0–0.1)
Basophils Relative: 0.7 % (ref 0.0–3.0)
Eosinophils Absolute: 0.2 10*3/uL (ref 0.0–0.7)
Hemoglobin: 13.3 g/dL (ref 12.0–15.0)
Lymphocytes Relative: 24.2 % (ref 12.0–46.0)
MCHC: 34 g/dL (ref 30.0–36.0)
Monocytes Relative: 9.7 % (ref 3.0–12.0)
Neutro Abs: 3.4 10*3/uL (ref 1.4–7.7)
Neutrophils Relative %: 61.3 % (ref 43.0–77.0)
RBC: 4.28 Mil/uL (ref 3.87–5.11)
RDW: 13.2 % (ref 11.5–14.6)

## 2012-10-06 LAB — POCT URINALYSIS DIPSTICK
Blood, UA: NEGATIVE
Leukocytes, UA: NEGATIVE
Nitrite, UA: NEGATIVE
Protein, UA: NEGATIVE
Urobilinogen, UA: 0.2
pH, UA: 7.5

## 2012-10-06 LAB — LIPID PANEL
Cholesterol: 242 mg/dL — ABNORMAL HIGH (ref 0–200)
Total CHOL/HDL Ratio: 3
Triglycerides: 48 mg/dL (ref 0.0–149.0)
VLDL: 9.6 mg/dL (ref 0.0–40.0)

## 2012-10-06 LAB — BASIC METABOLIC PANEL
CO2: 31 mEq/L (ref 19–32)
Chloride: 103 mEq/L (ref 96–112)
Potassium: 3.9 mEq/L (ref 3.5–5.1)

## 2012-10-06 LAB — HEPATIC FUNCTION PANEL
ALT: 16 U/L (ref 0–35)
Albumin: 4.2 g/dL (ref 3.5–5.2)
Total Protein: 6.8 g/dL (ref 6.0–8.3)

## 2012-10-06 LAB — LDL CHOLESTEROL, DIRECT: Direct LDL: 137.5 mg/dL

## 2012-10-09 ENCOUNTER — Telehealth: Payer: Self-pay

## 2012-10-09 NOTE — Telephone Encounter (Signed)
Pt has stopped the meloxicam as of Saturday. Was causing problems with her bowel movements (constipation and black stools). Her wrist feels good and she does not think she need to continue an anti-inflammarory unless Dr. Katrinka Grant wants her to.

## 2012-10-10 ENCOUNTER — Encounter: Payer: Self-pay | Admitting: Family Medicine

## 2012-10-10 ENCOUNTER — Ambulatory Visit (INDEPENDENT_AMBULATORY_CARE_PROVIDER_SITE_OTHER): Payer: Self-pay | Admitting: Family Medicine

## 2012-10-10 VITALS — BP 100/64 | Temp 98.2°F | Ht 66.5 in | Wt 136.0 lb

## 2012-10-10 DIAGNOSIS — Z23 Encounter for immunization: Secondary | ICD-10-CM

## 2012-10-10 DIAGNOSIS — E785 Hyperlipidemia, unspecified: Secondary | ICD-10-CM

## 2012-10-10 DIAGNOSIS — Z0000 Encounter for general adult medical examination without abnormal findings: Secondary | ICD-10-CM

## 2012-10-10 DIAGNOSIS — K5900 Constipation, unspecified: Secondary | ICD-10-CM

## 2012-10-10 DIAGNOSIS — F329 Major depressive disorder, single episode, unspecified: Secondary | ICD-10-CM

## 2012-10-10 DIAGNOSIS — F32A Depression, unspecified: Secondary | ICD-10-CM

## 2012-10-10 NOTE — Telephone Encounter (Signed)
She should d/c the Meloxicam. She should come in if she has had black stools. She indicates this has happened several times. She states her physical is scheduled with Dr Selena Batten at [facility] for today. I have advised her to have them check her stool for blood, this should be included with her physical. She states she will do this.

## 2012-10-10 NOTE — Patient Instructions (Signed)
Constipation: -fiber supplement daily (such as metameucil or citracel) -mirilax 1 capful in glass of water or juice daily for 3-4 days, if not resolved then increased to twice daily for 3-4 days -follow up with PCP in 1 month if continued issues  Flu shot given today  Increase cardiovascular exercise  We recommend the following healthy lifestyle measures: - eat a healthy diet consisting of lots of vegetables, fruits, beans, nuts, seeds, healthy meats such as white chicken and fish and whole grains.  - avoid fried foods, fast food, processed foods, sodas, red meet and other fattening foods.  - get a least 150 minutes of aerobic exercise per week.

## 2012-10-10 NOTE — Progress Notes (Signed)
Chief Complaint  Patient presents with  . Annual Exam    HPI:  Here for CPE:  -Concerns today:   1) darker brown hard stool with some constipation last week: -after taking meloxicam -hard, small dark pellet stools with straining  -denies: melena, BRBPR, dizziness  2)Depression: -on wellbutrin, sees Dr. Nolen Mu -on new medication brintellix for anxiety -has follow up in 1-2 weeks, seems like doing ok  3)HLD: -wants to review labs -could improve on cv exercise  -Diet: variety of foods, balance and well rounded  -Taking folic acid: takes calcium and vit D  -Exercise: regular exercise  -Diabetes and Dyslipidemia Screening: done by PCP, borderline hyperlipidemia, but HDL is great - could improve on CV exercise  -Hx of HTN: no  -Vaccines: UTD, getting flu today  -pap history: had last year, and set up with gyn to get this year  -FDLMP: perimenopausal  -sexual activity: yes, female partner, no new partners  -wants STI testing: no  -FH breast, colon or ovarian ca: see FH -had mammogram -had colonoscopy a few years ago and noraml  -Alcohol, Tobacco, drug use: see social history  Review of Systems - Review of Systems  Constitutional: Negative for fever, weight loss and malaise/fatigue.  Eyes: Negative for blurred vision and pain.  Respiratory: Negative for shortness of breath.   Cardiovascular: Negative for chest pain.  Gastrointestinal: Negative for vomiting, diarrhea and blood in stool.  Genitourinary: Negative for dysuria.  Musculoskeletal: Positive for myalgias. Negative for joint pain and falls.  Skin: Negative for rash.  Neurological: Negative for dizziness, seizures and headaches.  Endo/Heme/Allergies: Does not bruise/bleed easily.  Psychiatric/Behavioral: Negative for depression and suicidal ideas.     Past Medical History  Diagnosis Date  . ANXIETY 06/02/2007  . BELL'S PALSY, LEFT 12/02/2008  . Supraventricular tachycardia 06/02/2007    Atrial  tachycardia with Wenckebach periodicity  . ALLERGIC RHINITIS 07/03/2008  . BREAST MASS, RIGHT 06/03/2008  . IRREGULAR MENSES 06/03/2008  . CELLULITIS, LEFT LEG 09/14/2008  . CORNS AND CALLUSES 07/03/2008  . FOOT PAIN 09/22/2009  . CHEST PAIN 07/07/2009  . OCD (obsessive compulsive disorder)   . Depression   . Arthritis     Past Surgical History  Procedure Laterality Date  . Wisdom tooth extraction      Family History  Problem Relation Age of Onset  . Asthma Sister   . Cancer Mother     Posey Rea where cancer started     History   Social History  . Marital Status: Married    Spouse Name: N/A    Number of Children: N/A  . Years of Education: N/A   Occupational History  . Business Owner     Social History Main Topics  . Smoking status: Never Smoker   . Smokeless tobacco: Never Used  . Alcohol Use: Yes     Comment: social  . Drug Use: No  . Sexual Activity: Yes     Comment: married   Other Topics Concern  . None   Social History Narrative   No caffeine     Current outpatient prescriptions:Alfalfa TABS, Take by mouth., Disp: , Rfl: ;  Ascorbic Acid (VITAMIN C PO), Take 2 tablets by mouth daily.  , Disp: , Rfl: ;  B Complex Vitamins (VITAMIN B COMPLEX PO), Take 2 tablets by mouth daily.  , Disp: , Rfl: ;  bifidobacterium infantis (ALIGN) capsule, Take 1 capsule by mouth daily., Disp: 14 capsule, Rfl: 0 buPROPion (WELLBUTRIN XL) 300 MG 24 hr  tablet, take 1 tablet by mouth once daily, Disp: 100 tablet, Rfl: 3;  Calcium-Magnesium-Vitamin D (CALCIUM MAGNESIUM PO), Take 4 tablets by mouth daily.  , Disp: , Rfl: ;  clonazePAM (KLONOPIN) 1 MG tablet, take 1 tablet by mouth once daily, Disp: 90 tablet, Rfl: 1;  fish oil-omega-3 fatty acids 1000 MG capsule, Take 3 g by mouth daily., Disp: , Rfl: ;  GARLIC PO, Take 2 tablets by mouth daily. , Disp: , Rfl:  meloxicam (MOBIC) 15 MG tablet, Take 1 tablet (15 mg total) by mouth daily., Disp: 30 tablet, Rfl: 0;  metoprolol succinate (TOPROL-XL) 25  MG 24 hr tablet, take 1/2 tablet by mouth twice a day, Disp: 50 tablet, Rfl: 3;  metoprolol tartrate (LOPRESSOR) 50 MG tablet, One tablet as needed for SVT, Disp: 15 tablet, Rfl: 12;  Multiple Vitamin (MULTIVITAMIN) tablet, Take 2 tablets by mouth daily.  , Disp: , Rfl:  Multiple Vitamins-Minerals (ZINC PO), Take by mouth daily., Disp: , Rfl: ;  predniSONE (DELTASONE) 20 MG tablet, 2 daily with food, Disp: 10 tablet, Rfl: 1;  TURMERIC PO, Take 1 tablet by mouth daily., Disp: , Rfl: ;  VITAMIN D, CHOLECALCIFEROL, PO, Take by mouth daily.  , Disp: , Rfl: ;  vitamin E 100 UNIT capsule, Take 100 Units by mouth daily., Disp: , Rfl: ;  Vortioxetine HBr 10 MG TABS, Take by mouth., Disp: , Rfl:  [DISCONTINUED] estrogen, conjugated,-medroxyprogesterone (PREMPRO) 0.625-2.5 MG per tablet, Take 1 tablet by mouth daily., Disp: 28 tablet, Rfl: 11  EXAM:  Filed Vitals:   10/10/12 1444  BP: 100/64  Temp: 98.2 F (36.8 C)    GENERAL: vitals reviewed and listed below, alert, oriented, appears well hydrated and in no acute distress  HEENT: head atraumatic, PERRLA, normal appearance of eyes, ears, nose and mouth. moist mucus membranes.  NECK: supple, no masses or lymphadenopathy  LUNGS: clear to auscultation bilaterally, no rales, rhonchi or wheeze  CV: HRRR, no peripheral edema or cyanosis, normal pedal pulses  BREAST: normal appearance - no lesions or discharge, on palpation normal breast tissue without any suspicious masses  ABDOMEN: bowel sounds normal, soft, non tender to palpation, no masses, no rebound or guarding  GU: deferred - seeing gyn  RECTAL: deferred - going to get with PCP if constipation continues  SKIN: no rash or abnormal lesions  MS: normal gait, moves all extremities normally  NEURO: PERRLA, normal gait  PSYCH: normal affect, pleasant and cooperative  ASSESSMENT AND PLAN:  Discussed the following assessment and plan:  Visit for preventive health examination  Need for  prophylactic vaccination and inoculation against influenza - Plan: Flu Vaccine QUAD 36+ mos PF IM (Fluarix)  Unspecified constipation  Hyperlipemia  Depression  -Discussed and advised all [place] preventive services health task force level A and B recommendations for age, sex and risks.  -some resolving constipation - tx per instructions with follow up if persists  -reviewed labs with pt, borderline hyperlipidemia, but HDL great - advised increased C exercise  -flu shot given  -getting pap with gyn  -Advised at least 150 minutes of exercise per week and a healthy diet low in saturated fats and sweets and consisting of fresh fruits and vegetables, lean meats such as fish and white chicken and whole grains.  -labs, studies and vaccines per orders this encounter  Orders Placed This Encounter  Procedures  . Flu Vaccine QUAD 36+ mos PF IM (Fluarix)    Patient Instructions  Constipation: -fiber supplement daily (such as  metameucil or citracel) -mirilax 1 capful in glass of water or juice daily for 3-4 days, if not resolved then increased to twice daily for 3-4 days -follow up with PCP in 1 month if continued issues  Flu shot given today  Increase cardiovascular exercise  We recommend the following healthy lifestyle measures: - eat a healthy diet consisting of lots of vegetables, fruits, beans, nuts, seeds, healthy meats such as white chicken and fish and whole grains.  - avoid fried foods, fast food, processed foods, sodas, red meet and other fattening foods.  - get a least 150 minutes of aerobic exercise per week.     Patient advised to return to clinic immediately if symptoms worsen or persist or new concerns.  @LIFEPLAN @  No Follow-up on file.  Kriste Basque R.

## 2012-10-22 ENCOUNTER — Ambulatory Visit (INDEPENDENT_AMBULATORY_CARE_PROVIDER_SITE_OTHER): Payer: BC Managed Care – PPO | Admitting: Family Medicine

## 2012-10-22 VITALS — BP 106/68 | HR 82 | Temp 98.5°F | Resp 16 | Ht 66.5 in | Wt 139.0 lb

## 2012-10-22 DIAGNOSIS — M7710 Lateral epicondylitis, unspecified elbow: Secondary | ICD-10-CM

## 2012-10-22 DIAGNOSIS — M7712 Lateral epicondylitis, left elbow: Secondary | ICD-10-CM

## 2012-10-22 NOTE — Progress Notes (Signed)
[address]   [place], [place]  [address]   [phone]  Subjective:    Patient ID: Shelby Grant, female    DOB: 1955-07-07, 57 y.o.   MRN: [id number]  HPI This 57 y.o. female presents for evaluation of worsening L lateral epicondylitis.  S/p evaluation by Dr. Elbert Ewings on 09/12/12.  No prescription provided; recommended ACE wrap; also provided with exercises but not clear.  Hurts constantly; hard to lift things, pulling things.  Has been icing it.  Rubbed some.  Prescribed Meloxicam; took; thought having adverse reaction to it.  Was having black tarry stools; having constipation.  Not taking Meloxicam because CTS has improved; wearing the brace qhs.  Still present.  Took Meloxicam for 1-2 weeks.  Review of Systems  Constitutional: Negative for fever, chills, diaphoresis and fatigue.  Musculoskeletal: Positive for arthralgias and myalgias. Negative for joint swelling, neck pain and neck stiffness.  Skin: Negative for rash.  Neurological: Positive for weakness. Negative for numbness.   Past Medical History  Diagnosis Date  . ANXIETY 06/02/2007  . BELL'S PALSY, LEFT 12/02/2008  . Supraventricular tachycardia 06/02/2007    Atrial tachycardia with Wenckebach periodicity  . ALLERGIC RHINITIS 07/03/2008  . BREAST MASS, RIGHT 06/03/2008  . IRREGULAR MENSES 06/03/2008  . CELLULITIS, LEFT LEG 09/14/2008  . CORNS AND CALLUSES 07/03/2008  . FOOT PAIN 09/22/2009  . CHEST PAIN 07/07/2009  . OCD (obsessive compulsive disorder)   . Depression   . Arthritis    Past Surgical History  Procedure Laterality Date  . Wisdom tooth extraction     Allergies  Allergen Reactions  . Amoxicillin     Unknown  . Latex     REACTION: rash   Current Outpatient Prescriptions on File Prior to Visit  Medication Sig Dispense Refill  . Alfalfa TABS Take by mouth.      . Ascorbic Acid (VITAMIN C PO) Take 2 tablets by mouth daily.        . B Complex Vitamins (VITAMIN B COMPLEX PO) Take 2 tablets by mouth daily.        .  bifidobacterium infantis (ALIGN) capsule Take 1 capsule by mouth daily.  14 capsule  0  . buPROPion (WELLBUTRIN XL) 300 MG 24 hr tablet take 1 tablet by mouth once daily  100 tablet  3  . Calcium-Magnesium-Vitamin D (CALCIUM MAGNESIUM PO) Take 4 tablets by mouth daily.        . clonazePAM (KLONOPIN) 1 MG tablet take 1 tablet by mouth once daily  90 tablet  1  . fish oil-omega-3 fatty acids 1000 MG capsule Take 3 g by mouth daily.      Marland Kitchen GARLIC PO Take 2 tablets by mouth daily.       . metoprolol succinate (TOPROL-XL) 25 MG 24 hr tablet take 1/2 tablet by mouth twice a day  50 tablet  3  . metoprolol tartrate (LOPRESSOR) 50 MG tablet One tablet as needed for SVT  15 tablet  12  . Multiple Vitamin (MULTIVITAMIN) tablet Take 2 tablets by mouth daily.        . Multiple Vitamins-Minerals (ZINC PO) Take by mouth daily.      . TURMERIC PO Take 1 tablet by mouth daily.      Marland Kitchen VITAMIN D, CHOLECALCIFEROL, PO Take by mouth daily.        . vitamin E 100 UNIT capsule Take 100 Units by mouth daily.      . Vortioxetine HBr 10 MG TABS Take  by mouth.      . meloxicam (MOBIC) 15 MG tablet Take 1 tablet (15 mg total) by mouth daily.  30 tablet  0  . predniSONE (DELTASONE) 20 MG tablet 2 daily with food  10 tablet  1  . [DISCONTINUED] estrogen, conjugated,-medroxyprogesterone (PREMPRO) 0.625-2.5 MG per tablet Take 1 tablet by mouth daily.  28 tablet  11   No current facility-administered medications on file prior to visit.   History   Social History  . Marital Status: Married    Spouse Name: N/A    Number of Children: N/A  . Years of Education: N/A   Occupational History  . Business Owner     Social History Main Topics  . Smoking status: Never Smoker   . Smokeless tobacco: Never Used  . Alcohol Use: Yes     Comment: social  . Drug Use: No  . Sexual Activity: Yes     Comment: married   Other Topics Concern  . Not on file   Social History Narrative   No caffeine        Objective:    Physical Exam  Nursing note and vitals reviewed. Constitutional: She appears well-developed and well-nourished. No distress.  HENT:  Head: Normocephalic and atraumatic.  Eyes: Conjunctivae are normal. Pupils are equal, round, and reactive to light.  Neck: Normal range of motion. Neck supple.  Musculoskeletal:       Left shoulder: Normal.       Left elbow: She exhibits normal range of motion and no swelling. Tenderness found. Lateral epicondyle tenderness noted. No radial head, no medial epicondyle and no olecranon process tenderness noted.       Left wrist: Normal. She exhibits normal range of motion and no tenderness.       Cervical back: Normal.       Left hand: Normal. Normal sensation noted. Normal strength noted.  Skin: She is not diaphoretic.      Assessment & Plan:  Lateral epicondylitis (tennis elbow), left - Plan: Ambulatory referral to Orthopedic Surgery, Ambulatory referral to Physical Therapy   1.  L Lateral epicondylitis:  Worsening; recommend stopping ACE wrap. Discussed treatment options including L forearm brace for lateral epicondylitis versus L wrist splint to avoid overuse of L eblow and forearm.  Recommend icing bid for 15 minutes; passive range of motion; advised to avoid repetitive use of L arm. Refer to physical therapy and ortho.

## 2012-10-24 ENCOUNTER — Telehealth: Payer: Self-pay

## 2012-10-24 DIAGNOSIS — M7712 Lateral epicondylitis, left elbow: Secondary | ICD-10-CM

## 2012-10-24 NOTE — Telephone Encounter (Signed)
I signed order for elbow sleeve; please call patient back and clarify what she needs and why.

## 2012-10-24 NOTE — Telephone Encounter (Signed)
I think she is asking for an elbow sleeve. We do not carry these, I pended order for her, if you agree sign and she can take to a medical supply facility to pick up.

## 2012-10-24 NOTE — Telephone Encounter (Signed)
Patient is seeing Dr . Penni Bombard on the 7 Oct,  Cancelled appt with Dr. Orlan Leavens.  Wants an elbow protective devise.  Discussed with Dr. Katrinka Blazing  Please call [phone]

## 2012-10-25 ENCOUNTER — Other Ambulatory Visit: Payer: Self-pay

## 2012-10-25 NOTE — Telephone Encounter (Signed)
She has indicated she does want the sleeve, the Rx is left at the front for her to pick up

## 2012-10-31 ENCOUNTER — Ambulatory Visit: Payer: BC Managed Care – PPO | Admitting: Family Medicine

## 2012-11-08 ENCOUNTER — Encounter: Payer: Self-pay | Admitting: Family Medicine

## 2012-11-09 ENCOUNTER — Telehealth: Payer: Self-pay

## 2012-11-09 NOTE — Telephone Encounter (Signed)
Appt cancelled and pt notified.

## 2012-11-09 NOTE — Telephone Encounter (Signed)
Dr. Smith please advise

## 2012-11-09 NOTE — Telephone Encounter (Signed)
Pt has an appt with Dr. Katrinka Blazing in October. She is seeing Dr. Penni Bombard for her elbow and carpel tunnel (referred by Dr. Katrinka Blazing) so she is not sure if she needs to come to this appt in October.  [phone]

## 2012-11-09 NOTE — Telephone Encounter (Signed)
Call --- patient does not need to follow-up with me in October since she is seeing Dr. Penni Bombard.  Please cancel appointment if patient agreeable.

## 2012-11-27 ENCOUNTER — Ambulatory Visit: Payer: BC Managed Care – PPO | Admitting: Family Medicine

## 2012-12-19 ENCOUNTER — Other Ambulatory Visit: Payer: Self-pay | Admitting: Family Medicine

## 2013-01-25 ENCOUNTER — Ambulatory Visit (INDEPENDENT_AMBULATORY_CARE_PROVIDER_SITE_OTHER): Payer: BC Managed Care – PPO | Admitting: Family Medicine

## 2013-01-25 VITALS — BP 110/60 | HR 67 | Temp 98.4°F | Resp 16 | Ht 67.0 in | Wt 138.0 lb

## 2013-01-25 DIAGNOSIS — L02219 Cutaneous abscess of trunk, unspecified: Secondary | ICD-10-CM

## 2013-01-25 NOTE — Progress Notes (Signed)
Subjective:    Patient ID: Shelby Grant, female    DOB: 14-Nov-1955, 57 y.o.   MRN: [id number] Chief Complaint  Patient presents with  . Wound Check    abscess recheck, upper inner thigh   HPI  Was seen at Dr. Dorita Sciara office on Monday - 3d prev - for a suprapubic mass.  She reports that they stuck a needle in it to see what it was and then the numbed it up and drained it. Was not packed.  Has been doing warm compresses several times daily and keeping covered w/ top mupirocin.  She also was started on a 2 wk course of 1 tab bid Bactrim and wound clx was pending. The following day it was better but since then has progressively worsened with redness and tenderness extending around lesion.  No f/u w/ Dr. Dorita Sciara office was arranged and pt is planning on leaving town in 2d.  Past Medical History  Diagnosis Date  . ANXIETY 06/02/2007  . BELL'S PALSY, LEFT 12/02/2008  . Supraventricular tachycardia 06/02/2007    Atrial tachycardia with Wenckebach periodicity  . ALLERGIC RHINITIS 07/03/2008  . BREAST MASS, RIGHT 06/03/2008  . IRREGULAR MENSES 06/03/2008  . CELLULITIS, LEFT LEG 09/14/2008  . CORNS AND CALLUSES 07/03/2008  . FOOT PAIN 09/22/2009  . CHEST PAIN 07/07/2009  . OCD (obsessive compulsive disorder)   . Depression   . Arthritis    Current Outpatient Prescriptions on File Prior to Visit  Medication Sig Dispense Refill  . Alfalfa TABS Take by mouth.      . Ascorbic Acid (VITAMIN C PO) Take 2 tablets by mouth daily.        . B Complex Vitamins (VITAMIN B COMPLEX PO) Take 2 tablets by mouth daily.        . bifidobacterium infantis (ALIGN) capsule Take 1 capsule by mouth daily.  14 capsule  0  . buPROPion (WELLBUTRIN XL) 300 MG 24 hr tablet take 1 tablet by mouth once daily  100 tablet  3  . Calcium-Magnesium-Vitamin D (CALCIUM MAGNESIUM PO) Take 4 tablets by mouth daily.        . clonazePAM (KLONOPIN) 1 MG tablet take 1 tablet by mouth once daily  90 tablet  0  . fish oil-omega-3 fatty acids 1000  MG capsule Take 3 g by mouth daily.      Marland Kitchen GARLIC PO Take 2 tablets by mouth daily.       . meloxicam (MOBIC) 15 MG tablet Take 1 tablet (15 mg total) by mouth daily.  30 tablet  0  . metoprolol succinate (TOPROL-XL) 25 MG 24 hr tablet take 1/2 tablet by mouth twice a day  50 tablet  3  . metoprolol tartrate (LOPRESSOR) 50 MG tablet One tablet as needed for SVT  15 tablet  12  . Multiple Vitamin (MULTIVITAMIN) tablet Take 2 tablets by mouth daily.        . Multiple Vitamins-Minerals (ZINC PO) Take by mouth daily.      . predniSONE (DELTASONE) 20 MG tablet 2 daily with food  10 tablet  1  . TURMERIC PO Take 1 tablet by mouth daily.      Marland Kitchen VITAMIN D, CHOLECALCIFEROL, PO Take by mouth daily.        . vitamin E 100 UNIT capsule Take 100 Units by mouth daily.      . Vortioxetine HBr 10 MG TABS Take by mouth.      . [DISCONTINUED] estrogen, conjugated,-medroxyprogesterone (PREMPRO) 0.625-2.5 MG  per tablet Take 1 tablet by mouth daily.  28 tablet  11   No current facility-administered medications on file prior to visit.   Allergies  Allergen Reactions  . Amoxicillin     Unknown  . Latex     REACTION: rash    Review of Systems  Constitutional: Negative for fever, chills, diaphoresis and activity change.  Genitourinary: Positive for pelvic pain.  Musculoskeletal: Positive for myalgias. Negative for gait problem and joint swelling.  Skin: Positive for color change, rash and wound. Negative for pallor.  Hematological: Negative for adenopathy. Does not bruise/bleed easily.      BP 110/60  Pulse 67  Temp(Src) 98.4 F (36.9 C)  Resp 16  Ht 5\' 7"  (1.702 m)  Wt 138 lb (62.596 kg)  BMI 21.61 kg/m2  SpO2 97% Objective:   Physical Exam  Constitutional: She is oriented to person, place, and time. She appears well-developed and well-nourished. No distress.  HENT:  Head: Normocephalic and atraumatic.  Right Ear: External ear normal.  Eyes: Conjunctivae are normal. No scleral icterus.    Pulmonary/Chest: Effort normal.  Neurological: She is alert and oriented to person, place, and time.  Skin: Skin is warm and dry. Lesion noted. She is not diaphoretic.  1 cm superficial ulceration w/ small amount of central necrotic tissue in central suprapubic area - in superior area of pubic hair on mons, small amount of erythema surrounding w/ severe tenderness. Indurated w/ central fluctuance, no spontaneous drainage.  Psychiatric: She has a normal mood and affect. Her behavior is normal.       Assessment & Plan:   Suprapubic abscess - Plan: Wound culture Repeat I&D today w/ packing. Recheck in 2d. Wound clx P. Could consider increasing bactrim to 2 tabs bid at f/u esp if pt is going to be out of town w/o ready access to f/u care.  Meds ordered this encounter  Medications  . sulfamethoxazole-trimethoprim (BACTRIM DS) 800-160 MG per tablet    Sig: Take 1 tablet by mouth 2 (two) times daily.    Norberto Sorenson, MD MPH

## 2013-01-25 NOTE — Progress Notes (Signed)
Procedure Note: Verbal consent obtained.  Local anesthesia with 2 cc 2% lidocaine.  Betadine prep.  Incision with 11 blade.  No purulence expressed.  Packed with 1/4 inch plain packing.  Cleansed and dressed.  Discussed wound care.  RTC 48 hours for recheck

## 2013-01-27 ENCOUNTER — Ambulatory Visit (INDEPENDENT_AMBULATORY_CARE_PROVIDER_SITE_OTHER): Payer: BC Managed Care – PPO | Admitting: Physician Assistant

## 2013-01-27 VITALS — BP 116/70 | HR 68 | Temp 98.8°F | Resp 16 | Ht 66.0 in | Wt 138.6 lb

## 2013-01-27 DIAGNOSIS — L03319 Cellulitis of trunk, unspecified: Secondary | ICD-10-CM

## 2013-01-27 DIAGNOSIS — L02219 Cutaneous abscess of trunk, unspecified: Secondary | ICD-10-CM

## 2013-01-27 NOTE — Progress Notes (Signed)
Patient ID: Shelby Grant MRN: [id number], DOB: [birth year] 57 y.o. Date of Encounter: 01/27/2013, 8:58 AM  Chief Complaint: Wound care   See previous note  HPI: 57 y.o. y/o female presents for wound care s/p I&D on 01/25/13.  Doing well No issues or complaints Afebrile/ no chills No nausea or vomiting Tolerating Septra DS Pain improved.  Daily dressing change Previous note reviewed  Past Medical History  Diagnosis Date  . ANXIETY 06/02/2007  . BELL'S PALSY, LEFT 12/02/2008  . Supraventricular tachycardia 06/02/2007    Atrial tachycardia with Wenckebach periodicity  . ALLERGIC RHINITIS 07/03/2008  . BREAST MASS, RIGHT 06/03/2008  . IRREGULAR MENSES 06/03/2008  . CELLULITIS, LEFT LEG 09/14/2008  . CORNS AND CALLUSES 07/03/2008  . FOOT PAIN 09/22/2009  . CHEST PAIN 07/07/2009  . OCD (obsessive compulsive disorder)   . Depression   . Arthritis      Home Meds: Prior to Admission medications   Medication Sig Start Date End Date Taking? Authorizing Provider  Alfalfa TABS Take by mouth.   Yes Historical Provider, MD  Ascorbic Acid (VITAMIN C PO) Take 2 tablets by mouth daily.     Yes Historical Provider, MD  B Complex Vitamins (VITAMIN B COMPLEX PO) Take 2 tablets by mouth daily.     Yes Historical Provider, MD  bifidobacterium infantis (ALIGN) capsule Take 1 capsule by mouth daily. 05/23/12  Yes Mardella Layman, MD  buPROPion (WELLBUTRIN XL) 300 MG 24 hr tablet take 1 tablet by mouth once daily 09/21/12  Yes Roderick Pee, MD  Calcium-Magnesium-Vitamin D (CALCIUM MAGNESIUM PO) Take 4 tablets by mouth daily.     Yes Historical Provider, MD  clonazePAM Scarlette Calico) 1 MG tablet take 1 tablet by mouth once daily 12/19/12  Yes Roderick Pee, MD  fish oil-omega-3 fatty acids 1000 MG capsule Take 3 g by mouth daily.   Yes Historical Provider, MD  GARLIC PO Take 2 tablets by mouth daily.    Yes Historical Provider, MD  meloxicam (MOBIC) 15 MG tablet Take 1 tablet (15 mg total) by mouth daily.  09/20/12  Yes Ethelda Chick, MD  metoprolol tartrate (LOPRESSOR) 50 MG tablet One tablet as needed for SVT 08/22/12  Yes Duke Salvia, MD  Multiple Vitamin (MULTIVITAMIN) tablet Take 2 tablets by mouth daily.     Yes Historical Provider, MD  Multiple Vitamins-Minerals (ZINC PO) Take by mouth daily.   Yes Historical Provider, MD  predniSONE (DELTASONE) 20 MG tablet 2 daily with food 09/12/12  Yes Elvina Sidle, MD  sulfamethoxazole-trimethoprim (BACTRIM DS) 800-160 MG per tablet Take 1 tablet by mouth 2 (two) times daily.   Yes Historical Provider, MD  TURMERIC PO Take 1 tablet by mouth daily.   Yes Historical Provider, MD  VITAMIN D, CHOLECALCIFEROL, PO Take by mouth daily.     Yes Historical Provider, MD  vitamin E 100 UNIT capsule Take 100 Units by mouth daily.   Yes Historical Provider, MD  Vortioxetine HBr 10 MG TABS Take by mouth.   Yes Historical Provider, MD  metoprolol succinate (TOPROL-XL) 25 MG 24 hr tablet take 1/2 tablet by mouth twice a day 09/21/12   Roderick Pee, MD    Allergies:  Allergies  Allergen Reactions  . Amoxicillin     Unknown  . Latex     REACTION: rash    ROS: Constitutional: Afebrile, no chills Cardiovascular: negative for chest pain or palpitations Dermatological: Positive for wound. Negative for erythema, pain, or warmth GI: No  nausea or vomiting   EXAM: Physical Exam: Blood pressure 116/70, pulse 68, temperature 98.8 F (37.1 C), temperature source Oral, resp. rate 16, height 5\' 6"  (1.676 m), weight 138 lb 9.6 oz (62.869 kg), SpO2 97.00%., Body mass index is 22.38 kg/(m^2). General: Well developed, well nourished, in no acute distress. Nontoxic appearing. Head: Normocephalic, atraumatic, sclera non-icteric.  Neck: Supple. Lungs: Breathing is unlabored. Heart: Normal rate. Skin:  Warm and moist. Dressing and packing in place. No induration, erythema, or tenderness to palpation. Neuro: Alert and oriented X 3. Moves all extremities spontaneously.  Normal gait.  Psych:  Responds to questions appropriately with a normal affect.       PROCEDURE: Dressing and packing removed. No purulence expressed Wound bed healthy Irrigated with 1% plain lidocaine 5 cc. Repacked with small amount of 1/4 plain packing.  Dressing applied  LAB: Culture: not done  A/P: 57 y.o. y/o female with suprapubic cellulitis/abscess as above s/p I&D on 01/25/13.  Wound care per above Continue Setra Pain well controlled Daily dressing changes Recheck as needed.   Grier Mitts, PA-C 01/27/2013 8:58 AM

## 2013-01-30 ENCOUNTER — Other Ambulatory Visit: Payer: Self-pay | Admitting: Family Medicine

## 2013-01-30 NOTE — Telephone Encounter (Signed)
Pt would like to increase the molaxicam for more than 30 days. Pt would like to know if she could have this called in by January 1,2015 because he deductible will then start over.  Best# [phone]

## 2013-01-31 NOTE — Telephone Encounter (Signed)
Can we give pt 90 day RF?

## 2013-05-06 ENCOUNTER — Ambulatory Visit (INDEPENDENT_AMBULATORY_CARE_PROVIDER_SITE_OTHER): Payer: BC Managed Care – PPO | Admitting: Emergency Medicine

## 2013-05-06 ENCOUNTER — Ambulatory Visit: Payer: BC Managed Care – PPO

## 2013-05-06 VITALS — BP 118/68 | HR 69 | Temp 98.1°F | Resp 16 | Ht 66.5 in | Wt 141.0 lb

## 2013-05-06 DIAGNOSIS — L729 Follicular cyst of the skin and subcutaneous tissue, unspecified: Secondary | ICD-10-CM

## 2013-05-06 DIAGNOSIS — R05 Cough: Secondary | ICD-10-CM

## 2013-05-06 DIAGNOSIS — J40 Bronchitis, not specified as acute or chronic: Secondary | ICD-10-CM

## 2013-05-06 DIAGNOSIS — R059 Cough, unspecified: Secondary | ICD-10-CM

## 2013-05-06 LAB — POCT CBC
Granulocyte percent: 47.4 %G (ref 37–80)
HCT, POC: 41.6 % (ref 37.7–47.9)
Hemoglobin: 13.3 g/dL (ref 12.2–16.2)
Lymph, poc: 2.6 (ref 0.6–3.4)
MCH: 30 pg (ref 27–31.2)
MCHC: 32 g/dL (ref 31.8–35.4)
MCV: 93.6 fL (ref 80–97)
MID (cbc): 0.5 (ref 0–0.9)
MPV: 8.2 fL (ref 0–99.8)
PLATELET COUNT, POC: 258 10*3/uL (ref 142–424)
POC Granulocyte: 2.8 (ref 2–6.9)
POC LYMPH PERCENT: 43.7 %L (ref 10–50)
POC MID %: 8.9 % (ref 0–12)
RBC: 4.44 M/uL (ref 4.04–5.48)
RDW, POC: 13.2 %
WBC: 5.9 10*3/uL (ref 4.6–10.2)

## 2013-05-06 LAB — POCT RAPID STREP A (OFFICE): Rapid Strep A Screen: NEGATIVE

## 2013-05-06 MED ORDER — AZITHROMYCIN 250 MG PO TABS: ORAL_TABLET | ORAL | Status: DC

## 2013-05-06 NOTE — Patient Instructions (Signed)
    Take Mucinex twice a day. Increase your probiotics to twice a day to   Bronchitis Bronchitis is inflammation of the airways that extend from the windpipe into the lungs (bronchi). The inflammation often causes mucus to develop, which leads to a cough. If the inflammation becomes severe, it may cause shortness of breath. CAUSES  Bronchitis may be caused by:   Viral infections.   Bacteria.   Cigarette smoke.   Allergens, pollutants, and other irritants.  SIGNS AND SYMPTOMS  The most common symptom of bronchitis is a frequent cough that produces mucus. Other symptoms include:  Fever.   Body aches.   Chest congestion.   Chills.   Shortness of breath.   Sore throat.  DIAGNOSIS  Bronchitis is usually diagnosed through a medical history and physical exam. Tests, such as chest X-rays, are sometimes done to rule out other conditions.  TREATMENT  You may need to avoid contact with whatever caused the problem (smoking, for example). Medicines are sometimes needed. These may include:  Antibiotics. These may be prescribed if the condition is caused by bacteria.  Cough suppressants. These may be prescribed for relief of cough symptoms.   Inhaled medicines. These may be prescribed to help open your airways and make it easier for you to breathe.   Steroid medicines. These may be prescribed for those with recurrent (chronic) bronchitis. HOME CARE INSTRUCTIONS  Get plenty of rest.   Drink enough fluids to keep your urine clear or pale yellow (unless you have a medical condition that requires fluid restriction). Increasing fluids may help thin your secretions and will prevent dehydration.   Only take over-the-counter or prescription medicines as directed by your health care provider.  Only take antibiotics as directed. Make sure you finish them even if you start to feel better.  Avoid secondhand smoke, irritating chemicals, and strong fumes. These will make  bronchitis worse. If you are a smoker, quit smoking. Consider using nicotine gum or skin patches to help control withdrawal symptoms. Quitting smoking will help your lungs heal faster.   Put a cool-mist humidifier in your bedroom at night to moisten the air. This may help loosen mucus. Change the water in the humidifier daily. You can also run the hot water in your shower and sit in the bathroom with the door closed for 5 10 minutes.   Follow up with your health care provider as directed.   Wash your hands frequently to avoid catching bronchitis again or spreading an infection to others.  SEEK MEDICAL CARE IF: Your symptoms do not improve after 1 week of treatment.  SEEK IMMEDIATE MEDICAL CARE IF:  Your fever increases.  You have chills.   You have chest pain.   You have worsening shortness of breath.   You have bloody sputum.  You faint.  You have lightheadedness.  You have a severe headache.   You vomit repeatedly. MAKE SURE YOU:   Understand these instructions.  Will watch your condition.  Will get help right away if you are not doing well or get worse. Document Released: 01/18/2005 Document Revised: 11/08/2012 Document Reviewed: 09/12/2012 [facility] Patient Information 2014 [place].

## 2013-05-06 NOTE — Progress Notes (Addendum)
Subjective:    Patient ID: Shelby Grant, female    DOB: 1955/06/25, 58 y.o.   MRN: [id number]  Chief Complaint  Patient presents with  . Chest Pain    chest congestion sore throat doesn't feel well x 2 days also wants abscess checked from Dec    This chart was scribed for Arlyss Queen, MD by Zettie Pho, ED Scribe.   HPI Shelby Grant is a 58 y.o. female with a history of allergic rhinitis who presents to [facility] complaining of congestion with associated sore throat, mild dry cough, and fatigue onset about a week ago. She denies myalgias, headache, back pain, fever. Patient reports that she has been exposed to numerous sick contacts with similar symptoms. She denies history of asthma and has not used an inhaler in the past. Patient has a history of left-sided Bell's Palsy and supraventricular tachycardia.   Patient is also requesting a recheck of an erythematous, itching abscess to the right side of the suprapubic region of the abdomen that she states has been recurrent over the past 3-4 months. She states that she has had two abscesses in the same area that have been incised and drained in the past. She reports that she has a dermatologist (Dr. Allyson Sabal), but has not been evaluated by him for these complaints. Patient has allergies to amoxicillin and latex.   Patient Active Problem List   Diagnosis Date Noted  . Bruising 07/08/2011  . Corn 05/11/2011  . Abdominal pain 05/11/2011  . Menopause syndrome 02/11/2011  . Hemorrhagic cystitis 12/22/2010  . Cervical polyp 06/08/2010  . BELL'S PALSY, LEFT 12/02/2008  . ALLERGIC RHINITIS 07/03/2008  . BREAST MASS, RIGHT 06/03/2008  . Supraventricular tachycardia 06/02/2007  . ANXIETY 06/02/2007   Past Medical History  Diagnosis Date  . ANXIETY 06/02/2007  . BELL'S PALSY, LEFT 12/02/2008  . Supraventricular tachycardia 06/02/2007    Atrial tachycardia with Wenckebach periodicity  . ALLERGIC RHINITIS 07/03/2008  . BREAST  MASS, RIGHT 06/03/2008  . IRREGULAR MENSES 06/03/2008  . CELLULITIS, LEFT LEG 09/14/2008  . CORNS AND CALLUSES 07/03/2008  . FOOT PAIN 09/22/2009  . CHEST PAIN 07/07/2009  . OCD (obsessive compulsive disorder)   . Depression   . Arthritis    Current Outpatient Prescriptions on File Prior to Visit  Medication Sig Dispense Refill  . Alfalfa TABS Take by mouth.      . Ascorbic Acid (VITAMIN C PO) Take 2 tablets by mouth daily.        . B Complex Vitamins (VITAMIN B COMPLEX PO) Take 2 tablets by mouth daily.        . bifidobacterium infantis (ALIGN) capsule Take 1 capsule by mouth daily.  14 capsule  0  . buPROPion (WELLBUTRIN XL) 300 MG 24 hr tablet take 1 tablet by mouth once daily  100 tablet  3  . Calcium-Magnesium-Vitamin D (CALCIUM MAGNESIUM PO) Take 4 tablets by mouth daily.        . clonazePAM (KLONOPIN) 1 MG tablet take 1 tablet by mouth once daily  90 tablet  0  . fish oil-omega-3 fatty acids 1000 MG capsule Take 3 g by mouth daily.      Marland Kitchen GARLIC PO Take 2 tablets by mouth daily.       . meloxicam (MOBIC) 15 MG tablet take 1 tablet by mouth once daily  90 tablet  0  . metoprolol succinate (TOPROL-XL) 25 MG 24 hr tablet take 1/2 tablet by mouth twice  a day  50 tablet  3  . metoprolol tartrate (LOPRESSOR) 50 MG tablet One tablet as needed for SVT  15 tablet  12  . Multiple Vitamin (MULTIVITAMIN) tablet Take 2 tablets by mouth daily.        . Multiple Vitamins-Minerals (ZINC PO) Take by mouth daily.      . TURMERIC PO Take 1 tablet by mouth daily.      Marland Kitchen VITAMIN D, CHOLECALCIFEROL, PO Take by mouth daily.        . vitamin E 100 UNIT capsule Take 100 Units by mouth daily.      . Vortioxetine HBr 10 MG TABS Take by mouth.      . predniSONE (DELTASONE) 20 MG tablet 2 daily with food  10 tablet  1  . sulfamethoxazole-trimethoprim (BACTRIM DS) 800-160 MG per tablet Take 1 tablet by mouth 2 (two) times daily.      . [DISCONTINUED] estrogen, conjugated,-medroxyprogesterone (PREMPRO) 0.625-2.5 MG  per tablet Take 1 tablet by mouth daily.  28 tablet  11   No current facility-administered medications on file prior to visit.   Allergies  Allergen Reactions  . Amoxicillin     Unknown  . Latex     REACTION: rash    Review of Systems  Constitutional: Positive for fatigue. Negative for fever.  HENT: Positive for congestion and sore throat.   Respiratory: Positive for cough.   Musculoskeletal: Negative for back pain and myalgias.  Skin: Positive for wound (abscess).  Neurological: Negative for headaches.      Objective:   Physical Exam  CONSTITUTIONAL: Well developed/well nourished HEAD: Normocephalic/atraumatic EYES: EOMI/PERRL ENMT: Mucous membranes moist; TMs are clear bilaterally; slight nasal congestion, slight erythema to the pharynx NECK: supple no meningeal signs SPINE:entire spine nontender CV: S1/S2 noted, no murmurs/rubs/gallops noted LUNGS: Lungs are clear to auscultation bilaterally, no apparent distress; mild rhonchi bilaterally without wheezes, good air exchange ABDOMEN: soft, nontender, no rebound or guarding GU:no cva tenderness NEURO: Pt is awake/alert, moves all extremitiesx4 EXTREMITIES: pulses normal, full ROM SKIN: warm, color normal; 0.5 cm x 0.5 cm firm, nodular area at the panty line on the right PSYCH: no abnormalities of mood noted  BP 118/68  Pulse 69  Temp(Src) 98.1 F (36.7 C) (Oral)  Resp 16  Ht 5' 6.5" (1.689 m)  Wt 141 lb (63.957 kg)  BMI 22.42 kg/m2  SpO2 99% [facility] reading (PRIMARY) by  Dr. Everlene Farrier her increased markings. No consolidative infiltrate  Results for orders placed in visit on 05/06/13  POCT RAPID STREP A (OFFICE)      Result Value Ref Range   Rapid Strep A Screen Negative  Negative  POCT CBC      Result Value Ref Range   WBC 5.9  4.6 - 10.2 K/uL   Lymph, poc 2.6  0.6 - 3.4   POC LYMPH PERCENT 43.7  10 - 50 %L   MID (cbc) 0.5  0 - 0.9   POC MID % 8.9  0 - 12 %M   POC Granulocyte 2.8  2 - 6.9   Granulocyte percent  47.4  37 - 80 %G   RBC 4.44  4.04 - 5.48 M/uL   Hemoglobin 13.3  12.2 - 16.2 g/dL   HCT, POC 41.6  37.7 - 47.9 %   MCV 93.6  80 - 97 fL   MCH, POC 30.0  27 - 31.2 pg   MCHC 32.0  31.8 - 35.4 g/dL   RDW, POC 13.2  Platelet Count, POC 258  142 - 424 K/uL   MPV 8.2  0 - 99.8 fL       Assessment & Plan:  4:00 PM- Discussed that the abscessed area appears to be due to a cyst and that she will need to follow up with the dermatology referral to have it removed. Will order a strep test and a chest x-ray. Discussed treatment plan with patient at bedside and patient verbalized agreement. Markings are heavy but white count is normal. We'll treat with a Z-Pak to cover for atypical infection. She is also advised to take Mucinex twice a day along with probiotics twice a day.   I personally performed the services described in this documentation, which was scribed in my presence. The recorded information has been reviewed and is accurate.

## 2013-05-09 LAB — CULTURE, GROUP A STREP: ORGANISM ID, BACTERIA: NORMAL

## 2013-08-28 ENCOUNTER — Other Ambulatory Visit: Payer: Self-pay | Admitting: Family Medicine

## 2013-10-16 ENCOUNTER — Telehealth: Payer: Self-pay | Admitting: Family Medicine

## 2013-10-16 MED ORDER — METOPROLOL SUCCINATE ER 25 MG PO TB24: ORAL_TABLET | ORAL | Status: DC

## 2013-10-16 NOTE — Telephone Encounter (Signed)
Shelby Grant, Shelby Grant is requesting re-fill on metoprolol succinate (TOPROL-XL) 25 MG 24 hr tablet

## 2013-10-22 ENCOUNTER — Other Ambulatory Visit (INDEPENDENT_AMBULATORY_CARE_PROVIDER_SITE_OTHER): Payer: BC Managed Care – PPO

## 2013-10-22 DIAGNOSIS — Z0000 Encounter for general adult medical examination without abnormal findings: Secondary | ICD-10-CM

## 2013-10-22 LAB — HEPATIC FUNCTION PANEL
ALT: 19 U/L (ref 0–35)
AST: 19 U/L (ref 0–37)
Albumin: 4.2 g/dL (ref 3.5–5.2)
Alkaline Phosphatase: 61 U/L (ref 39–117)
Bilirubin, Direct: 0 mg/dL (ref 0.0–0.3)
TOTAL PROTEIN: 7.1 g/dL (ref 6.0–8.3)
Total Bilirubin: 0.7 mg/dL (ref 0.2–1.2)

## 2013-10-22 LAB — CBC WITH DIFFERENTIAL/PLATELET
BASOS PCT: 0.4 % (ref 0.0–3.0)
Basophils Absolute: 0 10*3/uL (ref 0.0–0.1)
EOS ABS: 0.2 10*3/uL (ref 0.0–0.7)
EOS PCT: 2.6 % (ref 0.0–5.0)
HCT: 41.7 % (ref 36.0–46.0)
HEMOGLOBIN: 14 g/dL (ref 12.0–15.0)
LYMPHS ABS: 1.8 10*3/uL (ref 0.7–4.0)
Lymphocytes Relative: 20.3 % (ref 12.0–46.0)
MCHC: 33.5 g/dL (ref 30.0–36.0)
MCV: 92 fl (ref 78.0–100.0)
Monocytes Absolute: 0.7 10*3/uL (ref 0.1–1.0)
Monocytes Relative: 8.1 % (ref 3.0–12.0)
NEUTROS ABS: 6.2 10*3/uL (ref 1.4–7.7)
NEUTROS PCT: 68.6 % (ref 43.0–77.0)
Platelets: 247 10*3/uL (ref 150.0–400.0)
RBC: 4.54 Mil/uL (ref 3.87–5.11)
RDW: 13.8 % (ref 11.5–15.5)
WBC: 9.1 10*3/uL (ref 4.0–10.5)

## 2013-10-22 LAB — BASIC METABOLIC PANEL
BUN: 19 mg/dL (ref 6–23)
CHLORIDE: 106 meq/L (ref 96–112)
CO2: 32 mEq/L (ref 19–32)
Calcium: 9.7 mg/dL (ref 8.4–10.5)
Creatinine, Ser: 0.8 mg/dL (ref 0.4–1.2)
GFR: 79.64 mL/min (ref >60.00)
Glucose, Bld: 99 mg/dL (ref 70–99)
POTASSIUM: 4 meq/L (ref 3.5–5.1)
Sodium: 141 mEq/L (ref 135–145)

## 2013-10-22 LAB — POCT URINALYSIS DIPSTICK
Bilirubin, UA: NEGATIVE
Glucose, UA: NEGATIVE
KETONES UA: NEGATIVE
LEUKOCYTES UA: NEGATIVE
Nitrite, UA: NEGATIVE
PH UA: 5.5
PROTEIN UA: NEGATIVE
Spec Grav, UA: 1.02
Urobilinogen, UA: 0.2

## 2013-10-22 LAB — LIPID PANEL
Cholesterol: 251 mg/dL — ABNORMAL HIGH (ref 0–200)
HDL: 82.9 mg/dL (ref >39.00)
LDL CALC: 156 mg/dL — AB (ref 0–99)
NONHDL: 168.1
Total CHOL/HDL Ratio: 3
Triglycerides: 62 mg/dL (ref 0.0–149.0)
VLDL: 12.4 mg/dL (ref 0.0–40.0)

## 2013-10-23 LAB — TSH: TSH: 2.51 u[IU]/mL (ref 0.35–4.50)

## 2013-10-30 ENCOUNTER — Ambulatory Visit (INDEPENDENT_AMBULATORY_CARE_PROVIDER_SITE_OTHER): Payer: BC Managed Care – PPO | Admitting: Family Medicine

## 2013-10-30 ENCOUNTER — Encounter: Payer: Self-pay | Admitting: Family Medicine

## 2013-10-30 VITALS — BP 110/70 | Temp 98.1°F | Ht 66.25 in | Wt 137.0 lb

## 2013-10-30 DIAGNOSIS — R319 Hematuria, unspecified: Secondary | ICD-10-CM

## 2013-10-30 DIAGNOSIS — Z0000 Encounter for general adult medical examination without abnormal findings: Secondary | ICD-10-CM

## 2013-10-30 DIAGNOSIS — F411 Generalized anxiety disorder: Secondary | ICD-10-CM

## 2013-10-30 DIAGNOSIS — I471 Supraventricular tachycardia: Secondary | ICD-10-CM

## 2013-10-30 MED ORDER — BUPROPION HCL ER (XL) 300 MG PO TB24: ORAL_TABLET | ORAL | Status: DC

## 2013-10-30 MED ORDER — METOPROLOL SUCCINATE ER 25 MG PO TB24: ORAL_TABLET | ORAL | Status: DC

## 2013-10-30 MED ORDER — CLONAZEPAM 1 MG PO TABS: ORAL_TABLET | ORAL | Status: DC

## 2013-10-30 NOTE — Progress Notes (Signed)
   Subjective:    Patient ID: Shelby Grant, female    DOB: October 29, 1955, 58 y.o.   MRN: [id number]  HPI Perl is a 58 year old female married nonsmoker who comes in today for general physical examination  She takes Wellbutrin 300 mg daily for history of mild depression and Klonopin 1/2-1 mg each bedtime for sleep  She takes Toprol 25 mg one half tab twice a day because of a history of SVT.  She gets routine eye care, dental care, does not do BSE monthly encouraged to do so, and you mammography, colonoscopy was normal in early 74s.  Her last period was about a year ago. Dr. Deatra Ina does her pelvics and Paps   Review of Systems  Constitutional: Negative.   HENT: Negative.   Eyes: Negative.   Respiratory: Negative.   Cardiovascular: Negative.   Gastrointestinal: Negative.   Genitourinary: Negative.   Musculoskeletal: Negative.   Neurological: Negative.   Psychiatric/Behavioral: Negative.        Objective:   Physical Exam  Constitutional: She appears well-developed and well-nourished.  HENT:  Head: Normocephalic and atraumatic.  Right Ear: External ear normal.  Left Ear: External ear normal.  Nose: Nose normal.  Mouth/Throat: Oropharynx is clear and moist.  Eyes: EOM are normal. Pupils are equal, round, and reactive to light.  Neck: Normal range of motion. Neck supple. No thyromegaly present.  Cardiovascular: Normal rate, regular rhythm, normal heart sounds and intact distal pulses.  Exam reveals no gallop and no friction rub.   No murmur heard. Pulmonary/Chest: Effort normal and breath sounds normal.  Abdominal: Soft. Bowel sounds are normal. She exhibits no distension and no mass. There is no tenderness. There is no rebound.  Genitourinary:  Bilateral breast exam normal  Musculoskeletal: Normal range of motion.  Lymphadenopathy:    She has no cervical adenopathy.  Neurological: She is alert. She has normal reflexes. No cranial nerve deficit. She exhibits normal muscle  tone. Coordination normal.  Skin: Skin is warm and dry.  Psychiatric: She has a normal mood and affect. Her behavior is normal. Judgment and thought content normal.          Assessment & Plan:

## 2013-10-30 NOTE — Patient Instructions (Signed)
Continue your current medications  Followup in 1 year sooner if any problems  Remember BSE monthly and annual mammography

## 2013-10-31 LAB — POCT URINALYSIS DIPSTICK
Bilirubin, UA: NEGATIVE
Glucose, UA: NEGATIVE
Ketones, UA: NEGATIVE
LEUKOCYTES UA: NEGATIVE
Nitrite, UA: NEGATIVE
PROTEIN UA: NEGATIVE
RBC UA: NEGATIVE
Spec Grav, UA: 1.015
UROBILINOGEN UA: 0.2
pH, UA: 6

## 2013-10-31 NOTE — Addendum Note (Signed)
Addended by: Westley Hummer B on: 10/31/2013 10:31 AM   Modules accepted: Orders

## 2013-11-03 ENCOUNTER — Ambulatory Visit (INDEPENDENT_AMBULATORY_CARE_PROVIDER_SITE_OTHER): Payer: BC Managed Care – PPO | Admitting: Family Medicine

## 2013-11-03 ENCOUNTER — Ambulatory Visit (INDEPENDENT_AMBULATORY_CARE_PROVIDER_SITE_OTHER): Payer: BC Managed Care – PPO

## 2013-11-03 VITALS — BP 128/64 | HR 70 | Temp 98.2°F | Resp 16 | Ht 67.25 in | Wt 136.0 lb

## 2013-11-03 DIAGNOSIS — R0789 Other chest pain: Secondary | ICD-10-CM

## 2013-11-03 DIAGNOSIS — F439 Reaction to severe stress, unspecified: Secondary | ICD-10-CM

## 2013-11-03 DIAGNOSIS — Z658 Other specified problems related to psychosocial circumstances: Secondary | ICD-10-CM

## 2013-11-03 MED ORDER — GI COCKTAIL ~~LOC~~
30.0000 mL | Freq: Once | ORAL | Status: AC
  Administered 2013-11-03: 30 mL via ORAL

## 2013-11-03 NOTE — Progress Notes (Addendum)
Subjective:  This chart was scribed for Robyn Haber, MD by Lora Havens, Medical Scribe. This patient was seen in Room 8 and the patient's care was started at 2:21 PM.    Patient ID: Shelby Grant, female    DOB: July 28, 1955, 58 y.o.   MRN: [id number]  Chest Pain  Pertinent negatives include no diaphoresis, fever or shortness of breath.   HPI Comments: Shelby Grant is a 58 y.o. female who presents to the [facility] complaining of  Constant centralized CP for 2-3 weeks. Pt feels fatigue has less of an appetite and cannot breath as deeply. She feels a tightening when she takes a deep breath. She denies diaphoresis and nausea as associated symptoms. She mentions she just flushed a lot especially  when in an uncomfortable situation. She notes that eating and sleeping do not affect her chest pain. Pt did not take any medicine to relieve the pain. Pt notes she has done very little exercise during the past two weeks. She denies any pulmonary issues. Had recent physical with Dr. Sherren Mocha and all blood work came back fine but she did not mention her CP to her PCP who did not do a EKG. She notes that work has been very stressful. She has been taken KLONOPIN at night. She has no FHx of CAD, father is still alive and her mother passed away in the 63s due to renal cancer. She owns a residential cleaning service  Patient Active Problem List   Diagnosis Date Noted   Bruising 07/08/2011   Corn 05/11/2011   Abdominal pain 05/11/2011   Menopause syndrome 02/11/2011   Hemorrhagic cystitis 12/22/2010   Cervical polyp 06/08/2010   BELL'S PALSY, LEFT 12/02/2008   ALLERGIC RHINITIS 07/03/2008   BREAST MASS, RIGHT 06/03/2008   Supraventricular tachycardia 06/02/2007   ANXIETY 06/02/2007   Past Medical History  Diagnosis Date   ANXIETY 06/02/2007   BELL'S PALSY, LEFT 12/02/2008   Supraventricular tachycardia 06/02/2007    Atrial tachycardia with Wenckebach periodicity    ALLERGIC RHINITIS 07/03/2008   BREAST MASS, RIGHT 06/03/2008   IRREGULAR MENSES 06/03/2008   CELLULITIS, LEFT LEG 09/14/2008   CORNS AND CALLUSES 07/03/2008   FOOT PAIN 09/22/2009   CHEST PAIN 07/07/2009   OCD (obsessive compulsive disorder)    Depression    Arthritis    Past Surgical History  Procedure Laterality Date   Wisdom tooth extraction     Allergies  Allergen Reactions   Amoxicillin     Unknown   Latex     REACTION: rash   Prior to Admission medications   Medication Sig Start Date End Date Taking? Authorizing Provider  Alfalfa TABS Take by mouth.   Yes Historical Provider, MD  Ascorbic Acid (VITAMIN C PO) Take 2 tablets by mouth daily.     Yes Historical Provider, MD  B Complex Vitamins (VITAMIN B COMPLEX PO) Take 2 tablets by mouth daily.     Yes Historical Provider, MD  bifidobacterium infantis (ALIGN) capsule Take 1 capsule by mouth daily. 05/23/12  Yes Sable Feil, MD  buPROPion (WELLBUTRIN XL) 300 MG 24 hr tablet take 1 tablet by mouth once daily 10/30/13  Yes Dorena Cookey, MD  Calcium-Magnesium-Vitamin D (CALCIUM MAGNESIUM PO) Take 4 tablets by mouth daily.     Yes Historical Provider, MD  clonazePAM (KLONOPIN) 1 MG tablet take 1 tablet by mouth once daily  (NEED TO HAVE OFFICE VISIT FOR MORE REFILLS) 10/30/13  Yes Jory Ee  Sherren Mocha, MD  GARLIC PO Take 2 tablets by mouth daily.    Yes Historical Provider, MD  metoprolol succinate (TOPROL-XL) 25 MG 24 hr tablet take 1/2 tablet by mouth twice a day 10/30/13  Yes Dorena Cookey, MD  Multiple Vitamin (MULTIVITAMIN) tablet Take 2 tablets by mouth daily.     Yes Historical Provider, MD  Multiple Vitamins-Minerals (ZINC PO) Take by mouth daily.   Yes Historical Provider, MD  TURMERIC PO Take 1 tablet by mouth daily.   Yes Historical Provider, MD  VITAMIN D, CHOLECALCIFEROL, PO Take by mouth daily.     Yes Historical Provider, MD  vitamin E 100 UNIT capsule Take 100 Units by mouth daily.   Yes Historical Provider, MD    History   Social History   Marital Status: Married    Spouse Name: N/A    Number of Children: N/A   Years of Education: N/A   Occupational History   Armed forces operational officer     Social History Main Topics   Smoking status: Never Smoker    Smokeless tobacco: Never Used   Alcohol Use: Yes     Comment: social   Drug Use: No   Sexual Activity: Yes     Comment: married   Other Topics Concern   Not on file   Social History Narrative   No caffeine    Review of Systems  Constitutional: Positive for activity change, appetite change and fatigue. Negative for fever and diaphoresis.  Respiratory: Negative for shortness of breath.   Cardiovascular: Positive for chest pain.   Objective:   Physical Exam  Nursing note and vitals reviewed. Constitutional: She is oriented to person, place, and time. She appears well-developed and well-nourished.  HENT:  Head: Normocephalic and atraumatic.  Eyes: EOM are normal.  Neck: Normal range of motion.  Cardiovascular: Normal rate, regular rhythm and normal heart sounds.   No murmur heard. Pulmonary/Chest: Effort normal and breath sounds normal. No respiratory distress. She has no wheezes. She has no rales.  Musculoskeletal: Normal range of motion.  Neurological: She is alert and oriented to person, place, and time.  Skin: Skin is warm and dry.  Psychiatric: She has a normal mood and affect. Her behavior is normal.   [facility] reading (PRIMARY) by  Dr. Joseph Art. CXR negative. EKG: Normal sinus rhythm No significant improvement with GI cocktail    BP 128/64   Pulse 70   Temp(Src) 98.2 F (36.8 C) (Oral)   Resp 16   Ht 5' 7.25" (1.708 m)   Wt 136 lb (61.689 kg)   BMI 21.15 kg/m2   SpO2 98%  Assessment & Plan:  I personally performed the services described in this documentation, which was scribed in my presence. The recorded information has been reviewed and is accurate.  I really think the problem here is stress. The patient's dizziness is  been very successful and it is growing which means she is a lot more responsibility. As responsibilities,, or symptoms of worsening.  Patient now has some consulting help which should reduce some distress. Eyes revealed her to take a quarter of her lorazepam tablets one distress.significant.  As it would pass this information on to Dr. Sherren Mocha as well.  Signed, Carola Frost.D.

## 2013-12-11 ENCOUNTER — Ambulatory Visit (INDEPENDENT_AMBULATORY_CARE_PROVIDER_SITE_OTHER): Payer: BC Managed Care – PPO | Admitting: Family Medicine

## 2013-12-11 ENCOUNTER — Encounter: Payer: Self-pay | Admitting: Family Medicine

## 2013-12-11 VITALS — BP 110/70 | Temp 98.1°F | Wt 137.0 lb

## 2013-12-11 DIAGNOSIS — R300 Dysuria: Secondary | ICD-10-CM

## 2013-12-11 DIAGNOSIS — N3000 Acute cystitis without hematuria: Secondary | ICD-10-CM

## 2013-12-11 LAB — POCT URINALYSIS DIPSTICK
Bilirubin, UA: NEGATIVE
GLUCOSE UA: NEGATIVE
Ketones, UA: NEGATIVE
NITRITE UA: NEGATIVE
PH UA: 6
PROTEIN UA: NEGATIVE
SPEC GRAV UA: 1.015
UROBILINOGEN UA: 0.2

## 2013-12-11 MED ORDER — SULFAMETHOXAZOLE-TRIMETHOPRIM 800-160 MG PO TABS: 1.0000 | ORAL_TABLET | Freq: Two times a day (BID) | ORAL | Status: DC

## 2013-12-11 NOTE — Progress Notes (Signed)
Pre visit review using our clinic review tool, if applicable. No additional management support is needed unless otherwise documented below in the visit note. 

## 2013-12-11 NOTE — Progress Notes (Signed)
   Subjective:    Patient ID: Shelby Grant, female    DOB: 09-May-1955, 58 y.o.   MRN: [id number]  HPI  Shelby Grant is a 58 year old female who comes in today for evaluation of acute cystitis  She states on Saturday evening she began having frequency and dysuria. No fever chills or back pain. Her last urinary tract infection was 3 years ago. She had some leftover Septra which she started over the weekend. She's taken 5 doses  Review of Systems    review of systems otherwise negative Objective:   Physical Exam  Well-developed well-nourished female no acute distress vital signs stable she is afebrile examination the abdomen abdomen is soft bowel sounds are normal liver spleen kidney, large I can appreciate no masses or tenderness.  Urinalysis shows white cells and bacteria      Assessment & Plan:  Cystitis resolving with Septra......... Finished therapeutic dose

## 2013-12-11 NOTE — Patient Instructions (Signed)
Septra DS one twice daily for 10 days  Drink lots of water  Return when necessary

## 2013-12-26 ENCOUNTER — Other Ambulatory Visit: Payer: Self-pay | Admitting: *Deleted

## 2013-12-26 MED ORDER — CIPROFLOXACIN HCL 250 MG PO TABS: ORAL_TABLET | ORAL | Status: DC

## 2013-12-26 NOTE — Telephone Encounter (Signed)
Rx sent to pharmacy per Dr Sherren Mocha.  Patient is aware. Patient was still having symptoms of UTI.

## 2014-01-08 LAB — HM MAMMOGRAPHY

## 2014-01-10 ENCOUNTER — Encounter: Payer: Self-pay | Admitting: Family Medicine

## 2014-01-14 LAB — HM MAMMOGRAPHY

## 2014-01-16 ENCOUNTER — Encounter: Payer: Self-pay | Admitting: Family Medicine

## 2014-02-18 ENCOUNTER — Other Ambulatory Visit: Payer: Self-pay | Admitting: Family Medicine

## 2014-02-18 DIAGNOSIS — D2239 Melanocytic nevi of other parts of face: Secondary | ICD-10-CM

## 2014-04-08 ENCOUNTER — Ambulatory Visit (INDEPENDENT_AMBULATORY_CARE_PROVIDER_SITE_OTHER): Payer: No Typology Code available for payment source | Admitting: Family Medicine

## 2014-04-08 ENCOUNTER — Encounter: Payer: Self-pay | Admitting: Family Medicine

## 2014-04-08 VITALS — BP 118/64 | HR 83 | Temp 98.5°F | Wt 142.9 lb

## 2014-04-08 DIAGNOSIS — R350 Frequency of micturition: Secondary | ICD-10-CM

## 2014-04-08 DIAGNOSIS — N3000 Acute cystitis without hematuria: Secondary | ICD-10-CM

## 2014-04-08 LAB — POCT URINALYSIS DIPSTICK
BILIRUBIN UA: NEGATIVE
GLUCOSE UA: NEGATIVE
KETONES UA: NEGATIVE
Leukocytes, UA: NEGATIVE
Nitrite, UA: NEGATIVE
Protein, UA: NEGATIVE
SPEC GRAV UA: 1.015
pH, UA: 6

## 2014-04-08 MED ORDER — SULFAMETHOXAZOLE-TRIMETHOPRIM 800-160 MG PO TABS: 1.0000 | ORAL_TABLET | Freq: Two times a day (BID) | ORAL | Status: DC

## 2014-04-08 NOTE — Patient Instructions (Signed)
Septra DS..... One twice daily for 1 week  If symptoms persist call and make an appointment to see a urologist for further evaluation ........Marland Kitchen

## 2014-04-08 NOTE — Progress Notes (Signed)
Pre visit review using our clinic review tool, if applicable. No additional management support is needed unless otherwise documented below in the visit note. 

## 2014-04-08 NOTE — Progress Notes (Signed)
   Subjective:    Patient ID: Shelby Grant, female    DOB: Feb 10, 1955, 59 y.o.   MRN: [id number]  HPI Karrissa is a 59 year old female who comes in today for evaluation of right lower quadrant abdominal pain with frequency and nocturia for 2 weeks  She's had no fever chills or back pain.  She's had urinary tract infections in the past but the discomfort using mid pubic. She's not had a history of any kidney stones although her pain on occasion she says is a 7 on a scale of 1-10 however she sleeps well at night except she has to get up once or twice to P   Review of Systems Review of systems otherwise negative no fever chills nausea vomiting diarrhea or GYN symptoms    Objective:   Physical Exam  Well-developed well-nourished female no acute distress vital signs stable she's afebrile examination the abdomen was negative urine was negative except for trace blood      Assessment & Plan:  I wonder she has a low-grade cystitis it's just hasn't completely resolved versus a possible kidney stone,,,,,,,,, treat symptomatically for the urinary tract infection urologic consult if symptoms persist

## 2014-05-30 ENCOUNTER — Ambulatory Visit (INDEPENDENT_AMBULATORY_CARE_PROVIDER_SITE_OTHER): Payer: No Typology Code available for payment source | Admitting: Family Medicine

## 2014-05-30 ENCOUNTER — Ambulatory Visit (INDEPENDENT_AMBULATORY_CARE_PROVIDER_SITE_OTHER): Payer: No Typology Code available for payment source

## 2014-05-30 VITALS — BP 110/70 | HR 70 | Temp 98.6°F | Resp 16 | Ht 67.0 in | Wt 135.0 lb

## 2014-05-30 DIAGNOSIS — M71572 Other bursitis, not elsewhere classified, left ankle and foot: Secondary | ICD-10-CM | POA: Diagnosis not present

## 2014-05-30 DIAGNOSIS — M25472 Effusion, left ankle: Secondary | ICD-10-CM

## 2014-05-30 DIAGNOSIS — M7752 Other enthesopathy of left foot: Secondary | ICD-10-CM

## 2014-05-30 DIAGNOSIS — M25572 Pain in left ankle and joints of left foot: Secondary | ICD-10-CM | POA: Diagnosis not present

## 2014-05-30 LAB — POCT CBC
GRANULOCYTE PERCENT: 58.4 % (ref 37–80)
HEMATOCRIT: 41.6 % (ref 37.7–47.9)
Hemoglobin: 13.8 g/dL (ref 12.2–16.2)
Lymph, poc: 2.8 (ref 0.6–3.4)
MCH, POC: 30.2 pg (ref 27–31.2)
MCHC: 33.2 g/dL (ref 31.8–35.4)
MCV: 91 fL (ref 80–97)
MID (cbc): 0.3 (ref 0–0.9)
MPV: 7.3 fL (ref 0–99.8)
POC Granulocyte: 4.3 (ref 2–6.9)
POC LYMPH PERCENT: 37.3 %L (ref 10–50)
POC MID %: 4.3 % (ref 0–12)
Platelet Count, POC: 262 10*3/uL (ref 142–424)
RBC: 4.57 M/uL (ref 4.04–5.48)
RDW, POC: 13.9 %
WBC: 7.4 10*3/uL (ref 4.6–10.2)

## 2014-05-30 NOTE — Progress Notes (Addendum)
Patient ID: Shelby Grant, female   DOB: [birth year], 59 y.o.   MRN: [id number]   Subjective:  This chart was scribed for Reginia Forts, MD by [facility], medical scribe at [facility].The patient was seen in exam room 04 and the patient's care was started at 8:19 PM.   Patient ID: Shelby Grant, female    DOB: Oct 06, 1955, 59 y.o.   MRN: [id number]  05/30/2014  Ankle Pain  HPI HPI Comments: Shelby Grant is a 59 y.o. female who presents to [facility] complaining of left ankle pain acute onset 2 days ago. Ankle has gradually swelled since two days ago. Pt ran the pig pounder 5k race on five days ago and placed first in her age group. She has conditioned herself for the race. No injury during the race or the following day.  Pain developed 48 hours after the race.   Pt is able to bear weight.  Walked about 1.5 miles today and felt some pain. She denies trauma to the ankle, no scratches to the ankle. Pt denies fatigue/malaise, fever, and chills. She has iced the ankle.  No anti-inflammatories used. No history of gout, and no family history of gout.  Review of Systems  Constitutional: Negative for fever, chills, diaphoresis and fatigue.  HENT: Negative for ear pain, postnasal drip, rhinorrhea, sinus pressure, sore throat and trouble swallowing.   Respiratory: Negative for cough and shortness of breath.   Cardiovascular: Negative for chest pain, palpitations and leg swelling.  Gastrointestinal: Negative for nausea, vomiting, abdominal pain, diarrhea and constipation.  Musculoskeletal: Positive for joint swelling, arthralgias and gait problem.  Skin: Positive for color change. Negative for pallor, rash and wound.    Past Medical History  Diagnosis Date   ANXIETY 06/02/2007   BELL'S PALSY, LEFT 12/02/2008   Supraventricular tachycardia 06/02/2007    Atrial tachycardia with Wenckebach periodicity   ALLERGIC RHINITIS 07/03/2008   BREAST MASS, RIGHT  06/03/2008   IRREGULAR MENSES 06/03/2008   CELLULITIS, LEFT LEG 09/14/2008   CORNS AND CALLUSES 07/03/2008   FOOT PAIN 09/22/2009   CHEST PAIN 07/07/2009   OCD (obsessive compulsive disorder)    Depression    Arthritis    Past Surgical History  Procedure Laterality Date   Wisdom tooth extraction     Allergies  Allergen Reactions   Amoxicillin     Unknown   Latex     REACTION: rash   Current Outpatient Prescriptions  Medication Sig Dispense Refill   Alfalfa TABS Take by mouth.     Ascorbic Acid (VITAMIN C PO) Take 2 tablets by mouth daily.       B Complex Vitamins (VITAMIN B COMPLEX PO) Take 2 tablets by mouth daily.       bifidobacterium infantis (ALIGN) capsule Take 1 capsule by mouth daily. 14 capsule 0   buPROPion (WELLBUTRIN XL) 300 MG 24 hr tablet take 1 tablet by mouth once daily 100 tablet 3   Calcium-Magnesium-Vitamin D (CALCIUM MAGNESIUM PO) Take 4 tablets by mouth daily.       ciprofloxacin (CIPRO) 250 MG tablet Take one tab twice daily for one week and the one tab at bedtime 21 tablet 0   clonazePAM (KLONOPIN) 1 MG tablet take 1 tablet by mouth once daily  (NEED TO HAVE OFFICE VISIT FOR MORE REFILLS) 90 tablet 3   GARLIC PO Take 2 tablets by mouth daily.      metoprolol succinate (TOPROL-XL) 25 MG 24  hr tablet take 1/2 tablet by mouth twice a day 100 tablet 3   Multiple Vitamin (MULTIVITAMIN) tablet Take 2 tablets by mouth daily.       Multiple Vitamins-Minerals (ZINC PO) Take by mouth daily.     sulfamethoxazole-trimethoprim (BACTRIM DS,SEPTRA DS) 800-160 MG per tablet Take 1 tablet by mouth 2 (two) times daily. 20 tablet 1   sulfamethoxazole-trimethoprim (BACTRIM DS,SEPTRA DS) 800-160 MG per tablet Take 1 tablet by mouth 2 (two) times daily. 14 tablet 1   TURMERIC PO Take 1 tablet by mouth daily.     VITAMIN D, CHOLECALCIFEROL, PO Take by mouth daily.       vitamin E 100 UNIT capsule Take 100 Units by mouth daily.     [DISCONTINUED] estrogen,  conjugated,-medroxyprogesterone (PREMPRO) 0.625-2.5 MG per tablet Take 1 tablet by mouth daily. 28 tablet 11   No current facility-administered medications for this visit.      Objective:    BP 110/70 mmHg   Pulse 70   Temp(Src) 98.6 F (37 C)   Resp 16   Ht 5' 7"  (1.702 m)   Wt 135 lb (61.236 kg)   BMI 21.14 kg/m2   SpO2 97% Physical Exam  Constitutional: She is oriented to person, place, and time. She appears well-developed and well-nourished. No distress.  HENT:  Head: Normocephalic and atraumatic.  Eyes: Conjunctivae and EOM are normal. Pupils are equal, round, and reactive to light.  Neck: Carotid bruit is not present.  Pulmonary/Chest: Effort normal.  Musculoskeletal:       Left knee: Normal. She exhibits normal range of motion, no swelling and no effusion. No tenderness found. No medial joint line and no lateral joint line tenderness noted.       Left ankle: She exhibits swelling. She exhibits normal range of motion, no ecchymosis, no deformity, no laceration and normal pulse. Tenderness. Lateral malleolus tenderness found. No medial malleolus, no AITFL, no CF ligament, no posterior TFL, no head of 5th metatarsal and no proximal fibula tenderness found. Achilles tendon normal.       Left lower leg: Normal. She exhibits no tenderness, no bony tenderness, no swelling, no edema, no deformity and no laceration.       Left foot: Normal. There is normal range of motion, no tenderness, no bony tenderness, no swelling, normal capillary refill, no crepitus, no deformity and no laceration.       Feet:  Left lateral ankle with tenderness to the superior aspect of the malleolus with 5 cm by 5 cm area of warmth and erythema with slight fluctuance at the base of malleolus. Full ROM of ankle with minimal pain.  Neurological: She is alert and oriented to person, place, and time. No cranial nerve deficit.  Skin: Skin is warm and dry. No rash noted. She is not diaphoretic. There is erythema. No  pallor.  Psychiatric: She has a normal mood and affect. Her behavior is normal.     [facility] reading (PRIMARY) by  Dr. Tamala Julian.  L ANKLE:  No acute process; +vascular line along distal fibula.      Assessment & Plan:   1. Left ankle swelling   2. Left ankle pain   3. Bursitis of left ankle    -New. -Treat with Meloxicam 32m daily. -Recommend rest, icing, elevation. -Placed in Long CAM walker due to concern of early stress fracture. -Refer to ortho. -WBC count thus cellulitis or infected bursitis less likely; due to warmth and erythema, will obtain ESR and Uric acid  level. Pt advised to RTC immediately for increasing swelling, redness, or development of fever.   No orders of the defined types were placed in this encounter.   No Follow-up on file.   I personally performed the services described in this documentation, which was scribed in my presence. The recorded information has been reviewed and considered.  Kristi Elayne Guerin, M.D. [facility] [address] [place], [place]  [address] [phone] phone [phone] fax

## 2014-05-30 NOTE — Patient Instructions (Signed)
1. Take Meloxicam 15mg  one tablet daily. 2.  Ice foot twice daily for 15 minutes 3.  Elevate foot as much as possible. 4. Wear boot with frequent ambulation.

## 2014-05-31 ENCOUNTER — Encounter: Payer: Self-pay | Admitting: Gastroenterology

## 2014-05-31 LAB — SEDIMENTATION RATE: Sed Rate: 4 mm/hr (ref 0–30)

## 2014-05-31 LAB — URIC ACID: Uric Acid, Serum: 4.3 mg/dL (ref 2.4–7.0)

## 2014-06-06 ENCOUNTER — Telehealth: Payer: Self-pay | Admitting: Family Medicine

## 2014-06-06 NOTE — Telephone Encounter (Signed)
Patient returned call about her lab results. I gave her the following information from Dr. Tamala Julian "Call --- 1. Inflammation marker normal. 2 . Uric acid level normal which makes gout unlikely cause of ankle pain.  3. How is ankle pain?" Patient states that her ankle pain has become worse. She has been wearing the boot and applying ice. She wants to know should she continue doing that and should she finish the Meloxocam?    [phone]

## 2014-06-06 NOTE — Telephone Encounter (Signed)
Gave pt previous message.

## 2014-06-06 NOTE — Telephone Encounter (Signed)
Dr Tamala Julian-- Ankle pain worse even with boot and ice. What do you recommend?

## 2014-06-06 NOTE — Telephone Encounter (Signed)
Recommend reevaluation in office or for patient to call orthopedic office where she has an appointment next week to see if they can see her sooner.  If they cannot see her sooner, recommend return visit to [facility].

## 2014-06-07 ENCOUNTER — Telehealth: Payer: Self-pay

## 2014-06-07 NOTE — Telephone Encounter (Signed)
Told pt this was fine. Told her to continue icing and taking meloxicam.

## 2014-06-07 NOTE — Telephone Encounter (Signed)
Patient has a referral and the original was May 12 th. Patient states that Dr. Tamala Julian advised that she needs to be seen much sooner. Patient now has an appointment on May 9th and wants to know if that's soon enough. She also wants to know what Dr. Tamala Julian suggests if this appoint isn't soon as she had in mind. Please call!  [phone]

## 2014-08-27 ENCOUNTER — Inpatient Hospital Stay (HOSPITAL_COMMUNITY)
Admission: EM | Admit: 2014-08-27 | Discharge: 2014-08-29 | DRG: 343 | Disposition: A | Discharge status: Home / Self Care | Payer: No Typology Code available for payment source | Attending: General Surgery | Admitting: General Surgery

## 2014-08-27 ENCOUNTER — Encounter (HOSPITAL_COMMUNITY): Payer: Self-pay | Admitting: Emergency Medicine

## 2014-08-27 ENCOUNTER — Emergency Department (HOSPITAL_COMMUNITY): Payer: No Typology Code available for payment source

## 2014-08-27 ENCOUNTER — Ambulatory Visit (INDEPENDENT_AMBULATORY_CARE_PROVIDER_SITE_OTHER): Payer: No Typology Code available for payment source | Admitting: Internal Medicine

## 2014-08-27 ENCOUNTER — Encounter: Payer: Self-pay | Admitting: Internal Medicine

## 2014-08-27 VITALS — BP 130/80 | HR 65 | Temp 98.9°F | Resp 20

## 2014-08-27 DIAGNOSIS — Z91040 Latex allergy status: Secondary | ICD-10-CM

## 2014-08-27 DIAGNOSIS — Z881 Allergy status to other antibiotic agents status: Secondary | ICD-10-CM

## 2014-08-27 DIAGNOSIS — I959 Hypotension, unspecified: Secondary | ICD-10-CM | POA: Diagnosis present

## 2014-08-27 DIAGNOSIS — S030XXA Dislocation of jaw, initial encounter: Secondary | ICD-10-CM | POA: Diagnosis not present

## 2014-08-27 DIAGNOSIS — D72829 Elevated white blood cell count, unspecified: Secondary | ICD-10-CM | POA: Diagnosis not present

## 2014-08-27 DIAGNOSIS — F329 Major depressive disorder, single episode, unspecified: Secondary | ICD-10-CM | POA: Diagnosis present

## 2014-08-27 DIAGNOSIS — K3580 Unspecified acute appendicitis: Principal | ICD-10-CM | POA: Diagnosis present

## 2014-08-27 DIAGNOSIS — S0300XA Dislocation of jaw, unspecified side, initial encounter: Secondary | ICD-10-CM

## 2014-08-27 DIAGNOSIS — X58XXXA Exposure to other specified factors, initial encounter: Secondary | ICD-10-CM | POA: Diagnosis not present

## 2014-08-27 DIAGNOSIS — R55 Syncope and collapse: Secondary | ICD-10-CM

## 2014-08-27 DIAGNOSIS — F419 Anxiety disorder, unspecified: Secondary | ICD-10-CM | POA: Diagnosis present

## 2014-08-27 DIAGNOSIS — R1084 Generalized abdominal pain: Secondary | ICD-10-CM | POA: Diagnosis not present

## 2014-08-27 DIAGNOSIS — K3532 Acute appendicitis with perforation and localized peritonitis, without abscess: Secondary | ICD-10-CM

## 2014-08-27 DIAGNOSIS — F42 Obsessive-compulsive disorder: Secondary | ICD-10-CM | POA: Diagnosis present

## 2014-08-27 DIAGNOSIS — Z531 Procedure and treatment not carried out because of patient's decision for reasons of belief and group pressure: Secondary | ICD-10-CM | POA: Diagnosis present

## 2014-08-27 DIAGNOSIS — Y92234 Operating room of hospital as the place of occurrence of the external cause: Secondary | ICD-10-CM

## 2014-08-27 LAB — CBC WITH DIFFERENTIAL/PLATELET
Basophils Absolute: 0 10*3/uL (ref 0.0–0.1)
Basophils Relative: 0 % (ref 0–1)
EOS PCT: 1 % (ref 0–5)
Eosinophils Absolute: 0.1 10*3/uL (ref 0.0–0.7)
HCT: 36.1 % (ref 36.0–46.0)
Hemoglobin: 12.2 g/dL (ref 12.0–15.0)
LYMPHS ABS: 1.4 10*3/uL (ref 0.7–4.0)
LYMPHS PCT: 9 % — AB (ref 12–46)
MCH: 30.9 pg (ref 26.0–34.0)
MCHC: 33.8 g/dL (ref 30.0–36.0)
MCV: 91.4 fL (ref 78.0–100.0)
Monocytes Absolute: 0.8 10*3/uL (ref 0.1–1.0)
Monocytes Relative: 5 % (ref 3–12)
NEUTROS PCT: 85 % — AB (ref 43–77)
Neutro Abs: 13.3 10*3/uL — ABNORMAL HIGH (ref 1.7–7.7)
Platelets: 210 10*3/uL (ref 150–400)
RBC: 3.95 MIL/uL (ref 3.87–5.11)
RDW: 13.6 % (ref 11.5–15.5)
WBC: 15.5 10*3/uL — AB (ref 4.0–10.5)

## 2014-08-27 LAB — POCT CBC
GRANULOCYTE PERCENT: 76.8 % (ref 37–80)
HCT, POC: 37.9 % (ref 37.7–47.9)
Hemoglobin: 12.8 g/dL (ref 12.2–16.2)
LYMPH, POC: 2.6 (ref 0.6–3.4)
MCH: 30.4 pg (ref 27–31.2)
MCHC: 33.8 g/dL (ref 31.8–35.4)
MCV: 90 fL (ref 80–97)
MID (CBC): 0.8 (ref 0–0.9)
MPV: 7.2 fL (ref 0–99.8)
POC Granulocyte: 11.1 — AB (ref 2–6.9)
POC LYMPH %: 17.6 % (ref 10–50)
POC MID %: 5.6 % (ref 0–12)
Platelet Count, POC: 263 10*3/uL (ref 142–424)
RBC: 4.21 M/uL (ref 4.04–5.48)
RDW, POC: 14.2 %
WBC: 14.5 10*3/uL — AB (ref 4.6–10.2)

## 2014-08-27 LAB — CBG MONITORING, ED: GLUCOSE-CAPILLARY: 110 mg/dL — AB (ref 65–99)

## 2014-08-27 LAB — GLUCOSE, POCT (MANUAL RESULT ENTRY): POC Glucose: 133 mg/dl — AB (ref 70–99)

## 2014-08-27 MED ORDER — IOHEXOL 300 MG/ML  SOLN
25.0000 mL | Freq: Once | INTRAMUSCULAR | Status: AC | PRN
  Administered 2014-08-27: 25 mL via ORAL

## 2014-08-27 MED ORDER — SODIUM CHLORIDE 0.9 % IV SOLN
1000.0000 mL | INTRAVENOUS | Status: DC
  Administered 2014-08-27: 1000 mL via INTRAVENOUS

## 2014-08-27 MED ORDER — FENTANYL CITRATE (PF) 100 MCG/2ML IJ SOLN
50.0000 ug | Freq: Once | INTRAMUSCULAR | Status: AC
  Administered 2014-08-28: 50 ug via INTRAVENOUS
  Filled 2014-08-27: qty 2

## 2014-08-27 MED ORDER — ONDANSETRON HCL 4 MG/2ML IJ SOLN
4.0000 mg | Freq: Once | INTRAMUSCULAR | Status: AC
  Administered 2014-08-28: 4 mg via INTRAVENOUS
  Filled 2014-08-27: qty 2

## 2014-08-27 NOTE — ED Notes (Signed)
Patient transported to X-ray 

## 2014-08-27 NOTE — ED Provider Notes (Signed)
I saw  / screened the patient very briefly prior to her transport to XR.  She was awake and alert.  Initial labs ordered.  Remainder of care will be conducted by another care provider. VSS, no distress.  Carmin Muskrat, MD 08/27/14 2258

## 2014-08-27 NOTE — ED Notes (Signed)
Patient's Husband Jeneen Rinks [phone]

## 2014-08-27 NOTE — ED Notes (Signed)
Patient arrived via [facility]. Reports given by EMS: Patient was at [facility] for abdominal pain x 3 days, and experienced syncopal episode with LOC. BP 80/50. 20 gauge IV in Left AC started at [facility]. Normal saline bolus 1000 ml given. NS infusing when patient arrived. VS - BP 119/68; Pulse 70, O2 sats 98% room air. Normal sinus rhythm. Patient also c/o intermittent diarrhea x 3 days, nausea, increased weakness and difficulty urinating.

## 2014-08-27 NOTE — Progress Notes (Signed)
Subjective:  This chart was scribed for Shelby Lin, MD by Leandra Kern, Medical Scribe. This patient was seen in Room 6 and the patient's care was started at 8:30 PM.   Patient ID: Shelby Grant, female    DOB: 1955/05/08, 59 y.o.   MRN: [id number]   Chief Complaint  Patient presents with  . Abdominal Pain    -pt diaphoretic in the waiting room and hard to arouse. She became unresponsive.  -BP 80/50, pulse 50, diaphoretic, unable to answer questions, moved emergently to bed 7, IV started, EKG within the limits, O2 was 98%, trendelberg position allowed her to arouse, complaining of diffuse abdominal pain. -PERRL, EOM conj, heart regular rate without murmmor, lungs clear, bowel sounds active, non tender to palpation, no rebound tenderness, extremities clear, peripheral pulses are intact. After 250 cc of normal saline bb 110/70, pulse 60, glu 133, WBC 14500 with a left shift.      HPI HPI Comments: ROSAISELA Grant is a 59 y.o. female who presents to [facility] complaining of abdominal pain, onset this afternoon.  She notes that she has loss of appetite yesterday and she has not been feeling nourished. Pt had a few episdoes of diarrhea yesterday. She complains of being thirsty, having a tingling sensation in her hands, having mild tremors, chills, and diaphoresis. She notes that she has not had a bowel movement since yesterday's diarrhea. Pt also notes that she has not been sleeping well. Pt denies experiencing any symptoms of urinary problems, feeling cold, cough, fever, trouble breathing, leg pain, headache, or vomiting. She also denies any PMHx of HTN, DM or any other underlying problems except taking Wellbutrin and Klonopin for GAD and metoprolol 25 for SVT per Dr. Caryl Comes.  Pt states that she takes metoprolol for supraventricular tachycardia but hasn't had any recent problems.Pt notes that she and her husband have recently been on a vacation at the beach in  [place],  drove back to a wedding in [place] three days ago, she notes at that time she was not feeling well/poor appetite and poor intake. She reports experiencing a burning sensation in her abdomin ever since and has no past history of reflux or ulcer disease. Pt notes that she has had 3 UTI's in the past 6-9 months. She states that she was feeling that she might be experiencing another UTI before her beach trip, however she notes being asymptomatic over the past 7-10 days   No new medications/no missed doses of Wellbutrin/she seldom uses Klonopin so this doesn't appear to be withdrawal/no outside drugs are involved Patient Active Problem List   Diagnosis Date Noted  . Acute cystitis 12/11/2013  . Bruising 07/08/2011  . Corn 05/11/2011  . Abdominal pain 05/11/2011  . Menopause syndrome 02/11/2011  . Hemorrhagic cystitis 12/22/2010  . Cervical polyp 06/08/2010  . BELL'S PALSY, LEFT 12/02/2008  . ALLERGIC RHINITIS 07/03/2008  . BREAST MASS, RIGHT 06/03/2008  . Supraventricular tachycardia 06/02/2007  . ANXIETY 06/02/2007   Past Medical History  Diagnosis Date  . ANXIETY 06/02/2007  . BELL'S PALSY, LEFT 12/02/2008  . Supraventricular tachycardia 06/02/2007    Atrial tachycardia with Wenckebach periodicity  . ALLERGIC RHINITIS 07/03/2008  . BREAST MASS, RIGHT 06/03/2008  . IRREGULAR MENSES 06/03/2008  . CELLULITIS, LEFT LEG 09/14/2008  . CORNS AND CALLUSES 07/03/2008  . FOOT PAIN 09/22/2009  . CHEST PAIN 07/07/2009  . OCD (obsessive compulsive disorder)   . Depression   . Arthritis  Past Surgical History  Procedure Laterality Date  . Wisdom tooth extraction     Allergies  Allergen Reactions  . Amoxicillin     Unknown  . Latex     REACTION: rash   Prior to Admission medications   Medication Sig Start Date End Date Taking? Authorizing Provider  Alfalfa TABS Take by mouth.    Historical Provider, MD  Ascorbic Acid (VITAMIN C PO) Take 2 tablets by mouth daily.      Historical Provider, MD  B  Complex Vitamins (VITAMIN B COMPLEX PO) Take 2 tablets by mouth daily.      Historical Provider, MD  bifidobacterium infantis (ALIGN) capsule Take 1 capsule by mouth daily. 05/23/12   Sable Feil, MD  buPROPion (WELLBUTRIN XL) 300 MG 24 hr tablet take 1 tablet by mouth once daily 10/30/13   Dorena Cookey, MD  Calcium-Magnesium-Vitamin D (CALCIUM MAGNESIUM PO) Take 4 tablets by mouth daily.      Historical Provider, MD  ciprofloxacin (CIPRO) 250 MG tablet Take one tab twice daily for one week and the one tab at bedtime 12/26/13   Dorena Cookey, MD  clonazePAM (KLONOPIN) 1 MG tablet take 1 tablet by mouth once daily  (NEED TO HAVE OFFICE VISIT FOR MORE REFILLS) 10/30/13   Dorena Cookey, MD  GARLIC PO Take 2 tablets by mouth daily.     Historical Provider, MD  metoprolol succinate (TOPROL-XL) 25 MG 24 hr tablet take 1/2 tablet by mouth twice a day 10/30/13   Dorena Cookey, MD  Multiple Vitamin (MULTIVITAMIN) tablet Take 2 tablets by mouth daily.      Historical Provider, MD  Multiple Vitamins-Minerals (ZINC PO) Take by mouth daily.    Historical Provider, MD  sulfamethoxazole-trimethoprim (BACTRIM DS,SEPTRA DS) 800-160 MG per tablet Take 1 tablet by mouth 2 (two) times daily. 12/11/13   Dorena Cookey, MD  sulfamethoxazole-trimethoprim (BACTRIM DS,SEPTRA DS) 800-160 MG per tablet Take 1 tablet by mouth 2 (two) times daily. 04/08/14   Dorena Cookey, MD  TURMERIC PO Take 1 tablet by mouth daily.    Historical Provider, MD  VITAMIN D, CHOLECALCIFEROL, PO Take by mouth daily.      Historical Provider, MD  vitamin E 100 UNIT capsule Take 100 Units by mouth daily.    Historical Provider, MD   History   Social History  . Marital Status: Married    Spouse Name: N/A  . Number of Children: N/A  . Years of Education: N/A   Occupational History  . Business Owner     Social History Main Topics  . Smoking status: Never Smoker   . Smokeless tobacco: Never Used  . Alcohol Use: Yes     Comment:  social  . Drug Use: No  . Sexual Activity: Yes     Comment: married   Other Topics Concern  . Not on file   Social History Narrative   No caffeine     Review of Systems  Constitutional: Positive for chills, diaphoresis and appetite change. Negative for fever.  Respiratory: Negative for cough and shortness of breath.   Gastrointestinal: Positive for abdominal pain and constipation. Negative for vomiting and blood in stool.  Genitourinary: Negative for dysuria, urgency and frequency.  Neurological: Positive for tremors and numbness. Negative for headaches.      Objective:   Physical Exam  Constitutional: She is oriented to person, place, and time. She appears well-developed and well-nourished. No distress.  HENT:  Head: Normocephalic  and atraumatic.  Tongue dry  Eyes: Conjunctivae and EOM are normal. Pupils are equal, round, and reactive to light.  Neck: Neck supple. No thyromegaly present.  Cardiovascular: Normal rate, regular rhythm, normal heart sounds and intact distal pulses.   No murmur heard. Pulmonary/Chest: Effort normal and breath sounds normal. No respiratory distress.  Abdominal: Soft. She exhibits no distension. There is no tenderness. There is no rebound.  Bowel sounds are relatively quiet on reexam with occasional borborygmi. She remains nontender to palpation and percussion but continues to complain of a burning sensation all over  Musculoskeletal: She exhibits no edema.  Lymphadenopathy:    She has no cervical adenopathy.  Neurological: She is alert and oriented to person, place, and time. She has normal reflexes. No cranial nerve deficit.  After starting the fluid she became tremulous without being cold having a lot of jerking motion of her extremities Deep tendon reflexes were only 2+ and were symmetrical There were no sensory or motor losses and extremities She remains somewhat lethargic but lucid and able to converse.  Skin: Skin is warm and dry. There is  pallor.  Her diaphoresis noted in the waiting room responded to IV fluids and stabilization of her blood pressure  Psychiatric: She has a normal mood and affect. Her behavior is normal.  Nursing note and vitals reviewed.  repeat blood pressure after 800 mL of fluid was 134/75--pulse remained low at 60 A second bag of normal saline was hung as EMS arrived  BP 130/80 mmHg  Pulse 65  Temp(Src) 98.9 F (37.2 C) (Oral)  Resp 20  SpO2 98%    Assessment & Plan:  I have completed the patient encounter in its entirety as documented by the scribe, with editing by me where necessary. Boysie Bonebrake P. Laney Pastor, M.D.  This is a patient with recent abdominal pain which intensified today and let her presentation here of sensory interruption with a blood pressure of 80/50 and diaphoresis requiring fluids to resuscitate. She has leukocytosis and no underlying cause can be identified. We will send her by EMS to the emergency room for further evaluation to rule out an intra-abdominal process, sepsis from an unclear source, electrolyte abnormalities from dehydration etc.   PCP Christie Nottingham MD

## 2014-08-28 ENCOUNTER — Inpatient Hospital Stay (HOSPITAL_COMMUNITY): Payer: No Typology Code available for payment source | Admitting: Anesthesiology

## 2014-08-28 ENCOUNTER — Emergency Department (HOSPITAL_COMMUNITY): Payer: No Typology Code available for payment source

## 2014-08-28 ENCOUNTER — Encounter (HOSPITAL_COMMUNITY): Payer: Self-pay | Admitting: Radiology

## 2014-08-28 ENCOUNTER — Encounter (HOSPITAL_COMMUNITY): Admission: EM | Disposition: A | Discharge status: Home / Self Care | Payer: Self-pay | Source: Home / Self Care

## 2014-08-28 ENCOUNTER — Inpatient Hospital Stay (HOSPITAL_COMMUNITY): Payer: No Typology Code available for payment source

## 2014-08-28 DIAGNOSIS — F42 Obsessive-compulsive disorder: Secondary | ICD-10-CM | POA: Diagnosis present

## 2014-08-28 DIAGNOSIS — K3580 Unspecified acute appendicitis: Secondary | ICD-10-CM | POA: Diagnosis present

## 2014-08-28 DIAGNOSIS — T8859XA Other complications of anesthesia, initial encounter: Secondary | ICD-10-CM

## 2014-08-28 DIAGNOSIS — Z881 Allergy status to other antibiotic agents status: Secondary | ICD-10-CM | POA: Diagnosis not present

## 2014-08-28 DIAGNOSIS — Z531 Procedure and treatment not carried out because of patient's decision for reasons of belief and group pressure: Secondary | ICD-10-CM | POA: Diagnosis present

## 2014-08-28 DIAGNOSIS — Y92234 Operating room of hospital as the place of occurrence of the external cause: Secondary | ICD-10-CM | POA: Diagnosis not present

## 2014-08-28 DIAGNOSIS — I959 Hypotension, unspecified: Secondary | ICD-10-CM | POA: Diagnosis present

## 2014-08-28 DIAGNOSIS — X58XXXA Exposure to other specified factors, initial encounter: Secondary | ICD-10-CM | POA: Diagnosis not present

## 2014-08-28 DIAGNOSIS — Z91040 Latex allergy status: Secondary | ICD-10-CM | POA: Diagnosis not present

## 2014-08-28 DIAGNOSIS — R55 Syncope and collapse: Secondary | ICD-10-CM | POA: Diagnosis present

## 2014-08-28 DIAGNOSIS — S030XXA Dislocation of jaw, initial encounter: Secondary | ICD-10-CM | POA: Diagnosis not present

## 2014-08-28 DIAGNOSIS — F419 Anxiety disorder, unspecified: Secondary | ICD-10-CM | POA: Diagnosis present

## 2014-08-28 DIAGNOSIS — F329 Major depressive disorder, single episode, unspecified: Secondary | ICD-10-CM | POA: Diagnosis present

## 2014-08-28 HISTORY — DX: Other complications of anesthesia, initial encounter: T88.59XA

## 2014-08-28 HISTORY — PX: LAPAROSCOPIC APPENDECTOMY: SHX408

## 2014-08-28 LAB — COMPLETE METABOLIC PANEL WITH GFR
ALT: 18 U/L (ref 6–29)
AST: 15 U/L (ref 10–35)
Albumin: 4.2 g/dL (ref 3.6–5.1)
Alkaline Phosphatase: 59 U/L (ref 33–130)
BUN: 20 mg/dL (ref 7–25)
CO2: 26 mEq/L (ref 20–31)
Calcium: 9.7 mg/dL (ref 8.6–10.4)
Chloride: 103 mEq/L (ref 98–110)
Creat: 0.82 mg/dL (ref 0.50–1.05)
GFR, Est African American: >89 mL/min (ref >=60)
GFR, Est Non African American: 79 mL/min (ref >=60)
Glucose, Bld: 138 mg/dL — ABNORMAL HIGH (ref 65–99)
Potassium: 3.9 mEq/L (ref 3.5–5.3)
Sodium: 137 mEq/L (ref 135–146)
Total Bilirubin: 0.4 mg/dL (ref 0.2–1.2)
Total Protein: 6.6 g/dL (ref 6.1–8.1)

## 2014-08-28 LAB — URINALYSIS, ROUTINE W REFLEX MICROSCOPIC
Bilirubin Urine: NEGATIVE
GLUCOSE, UA: NEGATIVE mg/dL
HGB URINE DIPSTICK: NEGATIVE
Ketones, ur: 15 mg/dL — AB
LEUKOCYTES UA: NEGATIVE
Nitrite: NEGATIVE
Protein, ur: NEGATIVE mg/dL
SPECIFIC GRAVITY, URINE: 1.02 (ref 1.005–1.030)
UROBILINOGEN UA: 0.2 mg/dL (ref 0.0–1.0)
pH: 6.5 (ref 5.0–8.0)

## 2014-08-28 LAB — COMPREHENSIVE METABOLIC PANEL
ALT: 19 U/L (ref 14–54)
AST: 15 U/L (ref 15–41)
Albumin: 3.7 g/dL (ref 3.5–5.0)
Alkaline Phosphatase: 50 U/L (ref 38–126)
Anion gap: 8 (ref 5–15)
BUN: 14 mg/dL (ref 6–20)
CO2: 24 mmol/L (ref 22–32)
CREATININE: 0.74 mg/dL (ref 0.44–1.00)
Calcium: 8.6 mg/dL — ABNORMAL LOW (ref 8.9–10.3)
Chloride: 105 mmol/L (ref 101–111)
Glucose, Bld: 110 mg/dL — ABNORMAL HIGH (ref 65–99)
Potassium: 3.5 mmol/L (ref 3.5–5.1)
SODIUM: 137 mmol/L (ref 135–145)
TOTAL PROTEIN: 6.2 g/dL — AB (ref 6.5–8.1)
Total Bilirubin: 0.8 mg/dL (ref 0.3–1.2)

## 2014-08-28 LAB — TROPONIN I

## 2014-08-28 SURGERY — APPENDECTOMY, LAPAROSCOPIC
Anesthesia: General | Site: Abdomen

## 2014-08-28 MED ORDER — MIDAZOLAM HCL 2 MG/2ML IJ SOLN
2.0000 mg | Freq: Once | INTRAMUSCULAR | Status: AC
  Administered 2014-08-28: 2 mg via INTRAVENOUS

## 2014-08-28 MED ORDER — SCOPOLAMINE 1 MG/3DAYS TD PT72
MEDICATED_PATCH | TRANSDERMAL | Status: AC
  Filled 2014-08-28: qty 1

## 2014-08-28 MED ORDER — MIDAZOLAM HCL 2 MG/2ML IJ SOLN
INTRAMUSCULAR | Status: AC
  Filled 2014-08-28: qty 2

## 2014-08-28 MED ORDER — FENTANYL CITRATE (PF) 250 MCG/5ML IJ SOLN
INTRAMUSCULAR | Status: AC
  Filled 2014-08-28: qty 5

## 2014-08-28 MED ORDER — METRONIDAZOLE IN NACL 5-0.79 MG/ML-% IV SOLN
500.0000 mg | Freq: Once | INTRAVENOUS | Status: DC
  Administered 2014-08-28: 500 mg via INTRAVENOUS
  Filled 2014-08-28: qty 100

## 2014-08-28 MED ORDER — VECURONIUM BROMIDE 10 MG IV SOLR
INTRAVENOUS | Status: AC
  Filled 2014-08-28: qty 10

## 2014-08-28 MED ORDER — VECURONIUM BROMIDE 10 MG IV SOLR
INTRAVENOUS | Status: DC | PRN
  Administered 2014-08-28: 4 mg via INTRAVENOUS

## 2014-08-28 MED ORDER — PHENYLEPHRINE 40 MCG/ML (10ML) SYRINGE FOR IV PUSH (FOR BLOOD PRESSURE SUPPORT)
PREFILLED_SYRINGE | INTRAVENOUS | Status: AC
  Filled 2014-08-28: qty 10

## 2014-08-28 MED ORDER — HEPARIN SODIUM (PORCINE) 5000 UNIT/ML IJ SOLN
5000.0000 [IU] | Freq: Three times a day (TID) | INTRAMUSCULAR | Status: DC
  Administered 2014-08-28 – 2014-08-29 (×4): 5000 [IU] via SUBCUTANEOUS
  Filled 2014-08-28: qty 1

## 2014-08-28 MED ORDER — GLYCOPYRROLATE 0.2 MG/ML IJ SOLN
INTRAMUSCULAR | Status: AC
  Filled 2014-08-28: qty 3

## 2014-08-28 MED ORDER — PROMETHAZINE HCL 25 MG/ML IJ SOLN: 6.2500 mg | INTRAMUSCULAR | Status: DC | PRN

## 2014-08-28 MED ORDER — CLONAZEPAM 0.5 MG PO TABS
0.2500 mg | ORAL_TABLET | Freq: Every day | ORAL | Status: DC
  Administered 2014-08-28: 0.5 mg via ORAL
  Filled 2014-08-28: qty 1

## 2014-08-28 MED ORDER — LACTATED RINGERS IV SOLN
INTRAVENOUS | Status: DC | PRN
  Administered 2014-08-28 (×2): via INTRAVENOUS

## 2014-08-28 MED ORDER — HYDROMORPHONE HCL 1 MG/ML IJ SOLN: 0.2500 mg | INTRAMUSCULAR | Status: DC | PRN

## 2014-08-28 MED ORDER — STERILE WATER FOR INJECTION IJ SOLN
INTRAMUSCULAR | Status: AC
  Filled 2014-08-28: qty 10

## 2014-08-28 MED ORDER — DEXAMETHASONE SODIUM PHOSPHATE 4 MG/ML IJ SOLN
INTRAMUSCULAR | Status: DC | PRN
  Administered 2014-08-28: 8 mg via INTRAVENOUS

## 2014-08-28 MED ORDER — SODIUM CHLORIDE 0.9 % IV SOLN
INTRAVENOUS | Status: DC
  Administered 2014-08-28: 05:00:00 via INTRAVENOUS

## 2014-08-28 MED ORDER — CIPROFLOXACIN IN D5W 400 MG/200ML IV SOLN
400.0000 mg | Freq: Two times a day (BID) | INTRAVENOUS | Status: AC
  Administered 2014-08-28 – 2014-08-29 (×2): 400 mg via INTRAVENOUS
  Filled 2014-08-28 (×2): qty 200

## 2014-08-28 MED ORDER — MIDAZOLAM HCL 5 MG/5ML IJ SOLN
INTRAMUSCULAR | Status: DC | PRN
  Administered 2014-08-28: 2 mg via INTRAVENOUS

## 2014-08-28 MED ORDER — 0.9 % SODIUM CHLORIDE (POUR BTL) OPTIME
TOPICAL | Status: DC | PRN
  Administered 2014-08-28: 1000 mL

## 2014-08-28 MED ORDER — METRONIDAZOLE IN NACL 5-0.79 MG/ML-% IV SOLN
500.0000 mg | Freq: Three times a day (TID) | INTRAVENOUS | Status: DC
  Administered 2014-08-28: 500 mg via INTRAVENOUS
  Filled 2014-08-28 (×3): qty 100

## 2014-08-28 MED ORDER — MEPERIDINE HCL 25 MG/ML IJ SOLN: 6.2500 mg | INTRAMUSCULAR | Status: DC | PRN

## 2014-08-28 MED ORDER — DIAZEPAM 5 MG/ML IJ SOLN
INTRAMUSCULAR | Status: AC
  Filled 2014-08-28: qty 2

## 2014-08-28 MED ORDER — IOHEXOL 300 MG/ML  SOLN
100.0000 mL | Freq: Once | INTRAMUSCULAR | Status: AC | PRN
  Administered 2014-08-28: 100 mL via INTRAVENOUS

## 2014-08-28 MED ORDER — HYDROMORPHONE HCL 1 MG/ML IJ SOLN: 0.5000 mg | INTRAMUSCULAR | Status: DC | PRN

## 2014-08-28 MED ORDER — SODIUM CHLORIDE 0.9 % IR SOLN
Status: DC | PRN
  Administered 2014-08-28: 2000 mL

## 2014-08-28 MED ORDER — METOPROLOL TARTRATE 1 MG/ML IV SOLN
INTRAVENOUS | Status: AC
  Filled 2014-08-28: qty 5

## 2014-08-28 MED ORDER — PHENYLEPHRINE HCL 10 MG/ML IJ SOLN
INTRAMUSCULAR | Status: DC | PRN
  Administered 2014-08-28: 120 ug via INTRAVENOUS
  Administered 2014-08-28: 80 ug via INTRAVENOUS

## 2014-08-28 MED ORDER — METRONIDAZOLE IN NACL 5-0.79 MG/ML-% IV SOLN
500.0000 mg | Freq: Three times a day (TID) | INTRAVENOUS | Status: DC
  Administered 2014-08-28 – 2014-08-29 (×2): 500 mg via INTRAVENOUS
  Filled 2014-08-28 (×3): qty 100

## 2014-08-28 MED ORDER — METOPROLOL SUCCINATE ER 25 MG PO TB24
12.5000 mg | ORAL_TABLET | Freq: Every day | ORAL | Status: DC
  Administered 2014-08-28: 12.5 mg via ORAL
  Filled 2014-08-28: qty 1

## 2014-08-28 MED ORDER — LIDOCAINE HCL (CARDIAC) 20 MG/ML IV SOLN
INTRAVENOUS | Status: DC | PRN
  Administered 2014-08-28: 100 mg via INTRAVENOUS

## 2014-08-28 MED ORDER — ONDANSETRON HCL 4 MG/2ML IJ SOLN
INTRAMUSCULAR | Status: AC
  Filled 2014-08-28: qty 2

## 2014-08-28 MED ORDER — NEOSTIGMINE METHYLSULFATE 10 MG/10ML IV SOLN
INTRAVENOUS | Status: DC | PRN
  Administered 2014-08-28: 4 mg via INTRAVENOUS

## 2014-08-28 MED ORDER — DIPHENHYDRAMINE HCL 50 MG/ML IJ SOLN
INTRAMUSCULAR | Status: AC
  Filled 2014-08-28: qty 1

## 2014-08-28 MED ORDER — ACETAMINOPHEN 325 MG PO TABS: 650.0000 mg | ORAL_TABLET | Freq: Four times a day (QID) | ORAL | Status: DC | PRN

## 2014-08-28 MED ORDER — NEOSTIGMINE METHYLSULFATE 10 MG/10ML IV SOLN
INTRAVENOUS | Status: AC
  Filled 2014-08-28: qty 1

## 2014-08-28 MED ORDER — DEXAMETHASONE SODIUM PHOSPHATE 4 MG/ML IJ SOLN
INTRAMUSCULAR | Status: AC
  Filled 2014-08-28: qty 1

## 2014-08-28 MED ORDER — OXYCODONE-ACETAMINOPHEN 5-325 MG PO TABS
1.0000 | ORAL_TABLET | ORAL | Status: DC | PRN
  Administered 2014-08-28: 2 via ORAL
  Administered 2014-08-29: 1 via ORAL
  Filled 2014-08-28: qty 1
  Filled 2014-08-28: qty 2

## 2014-08-28 MED ORDER — SUCCINYLCHOLINE CHLORIDE 20 MG/ML IJ SOLN
INTRAMUSCULAR | Status: DC | PRN
  Administered 2014-08-28: 120 mg via INTRAVENOUS

## 2014-08-28 MED ORDER — FENTANYL CITRATE (PF) 100 MCG/2ML IJ SOLN
INTRAMUSCULAR | Status: DC | PRN
  Administered 2014-08-28 (×2): 50 ug via INTRAVENOUS
  Administered 2014-08-28: 100 ug via INTRAVENOUS
  Administered 2014-08-28 (×2): 50 ug via INTRAVENOUS

## 2014-08-28 MED ORDER — ONDANSETRON HCL 4 MG/2ML IJ SOLN
INTRAMUSCULAR | Status: AC
  Administered 2014-08-28 (×2): 4 mg via INTRAVENOUS
  Filled 2014-08-28: qty 2

## 2014-08-28 MED ORDER — BUPIVACAINE-EPINEPHRINE (PF) 0.25% -1:200000 IJ SOLN
INTRAMUSCULAR | Status: AC
  Filled 2014-08-28: qty 30

## 2014-08-28 MED ORDER — PROPOFOL 10 MG/ML IV BOLUS
INTRAVENOUS | Status: AC
  Filled 2014-08-28: qty 20

## 2014-08-28 MED ORDER — SCOPOLAMINE 1 MG/3DAYS TD PT72
MEDICATED_PATCH | TRANSDERMAL | Status: AC
  Administered 2014-08-28: 1 via TRANSDERMAL
  Filled 2014-08-28: qty 1

## 2014-08-28 MED ORDER — KETOROLAC TROMETHAMINE 30 MG/ML IJ SOLN
INTRAMUSCULAR | Status: AC
  Filled 2014-08-28: qty 1

## 2014-08-28 MED ORDER — SODIUM CHLORIDE 0.9 % IV BOLUS (SEPSIS)
1000.0000 mL | Freq: Once | INTRAVENOUS | Status: AC
  Administered 2014-08-28: 1000 mL via INTRAVENOUS

## 2014-08-28 MED ORDER — BUPIVACAINE-EPINEPHRINE 0.25% -1:200000 IJ SOLN
INTRAMUSCULAR | Status: DC | PRN
  Administered 2014-08-28: 14 mL

## 2014-08-28 MED ORDER — KETOROLAC TROMETHAMINE 30 MG/ML IJ SOLN
INTRAMUSCULAR | Status: DC | PRN
  Administered 2014-08-28: 30 mg via INTRAVENOUS

## 2014-08-28 MED ORDER — CIPROFLOXACIN IN D5W 400 MG/200ML IV SOLN
400.0000 mg | Freq: Two times a day (BID) | INTRAVENOUS | Status: DC
  Filled 2014-08-28: qty 200

## 2014-08-28 MED ORDER — METOPROLOL TARTRATE 1 MG/ML IV SOLN
INTRAVENOUS | Status: DC | PRN
  Administered 2014-08-28: 2 mg via INTRAVENOUS

## 2014-08-28 MED ORDER — SODIUM CHLORIDE 0.9 % IV SOLN
INTRAVENOUS | Status: DC
  Administered 2014-08-28: 17:00:00 via INTRAVENOUS

## 2014-08-28 MED ORDER — ONDANSETRON HCL 4 MG/2ML IJ SOLN: 4.0000 mg | Freq: Four times a day (QID) | INTRAMUSCULAR | Status: DC | PRN

## 2014-08-28 MED ORDER — LIDOCAINE HCL (CARDIAC) 20 MG/ML IV SOLN
INTRAVENOUS | Status: AC
  Filled 2014-08-28: qty 5

## 2014-08-28 MED ORDER — ACETAMINOPHEN 650 MG RE SUPP: 650.0000 mg | Freq: Four times a day (QID) | RECTAL | Status: DC | PRN

## 2014-08-28 MED ORDER — SUCCINYLCHOLINE CHLORIDE 20 MG/ML IJ SOLN
INTRAMUSCULAR | Status: AC
  Filled 2014-08-28: qty 1

## 2014-08-28 MED ORDER — CIPROFLOXACIN IN D5W 400 MG/200ML IV SOLN
400.0000 mg | Freq: Once | INTRAVENOUS | Status: DC
  Administered 2014-08-28: 400 mg via INTRAVENOUS
  Filled 2014-08-28: qty 200

## 2014-08-28 MED ORDER — PROPOFOL 10 MG/ML IV BOLUS
INTRAVENOUS | Status: DC | PRN
  Administered 2014-08-28: 50 mg via INTRAVENOUS
  Administered 2014-08-28: 140 mg via INTRAVENOUS
  Administered 2014-08-28: 30 mg via INTRAVENOUS

## 2014-08-28 MED ORDER — MORPHINE SULFATE 4 MG/ML IJ SOLN
4.0000 mg | Freq: Once | INTRAMUSCULAR | Status: AC
  Administered 2014-08-28: 4 mg via INTRAVENOUS
  Filled 2014-08-28: qty 1

## 2014-08-28 MED ORDER — HYDROMORPHONE HCL 1 MG/ML IJ SOLN: 1.0000 mg | INTRAMUSCULAR | Status: DC | PRN

## 2014-08-28 MED ORDER — GLYCOPYRROLATE 0.2 MG/ML IJ SOLN
INTRAMUSCULAR | Status: DC | PRN
  Administered 2014-08-28: .5 mg via INTRAVENOUS

## 2014-08-28 MED ORDER — BUPROPION HCL ER (XL) 150 MG PO TB24
300.0000 mg | ORAL_TABLET | Freq: Every day | ORAL | Status: DC
  Administered 2014-08-29: 300 mg via ORAL
  Filled 2014-08-28: qty 2

## 2014-08-28 MED ORDER — DIPHENHYDRAMINE HCL 50 MG/ML IJ SOLN
INTRAMUSCULAR | Status: DC | PRN
  Administered 2014-08-28: 12.5 mg via INTRAVENOUS

## 2014-08-28 SURGICAL SUPPLY — 48 items
ADH SKN CLS APL DERMABOND .7 (GAUZE/BANDAGES/DRESSINGS) ×1
APPLIER CLIP ROT 10 11.4 M/L (STAPLE)
APR CLP MED LRG 11.4X10 (STAPLE)
BAG SPEC RTRVL LRG 6X4 10 (ENDOMECHANICALS) ×1
BLADE SURG ROTATE 9660 (MISCELLANEOUS) ×1 IMPLANT
CANISTER SUCTION 2500CC (MISCELLANEOUS) ×2 IMPLANT
CHLORAPREP W/TINT 26ML (MISCELLANEOUS) ×2 IMPLANT
CLIP APPLIE ROT 10 11.4 M/L (STAPLE) IMPLANT
COVER SURGICAL LIGHT HANDLE (MISCELLANEOUS) ×2 IMPLANT
CUTTER LINEAR ENDO 35 ART FLEX (STAPLE) ×1 IMPLANT
CUTTER LINEAR ENDO 35 ETS (STAPLE) IMPLANT
CUTTER LINEAR ENDO 35 ETS TH (STAPLE) IMPLANT
DERMABOND ADVANCED (GAUZE/BANDAGES/DRESSINGS) ×1
DERMABOND ADVANCED .7 DNX12 (GAUZE/BANDAGES/DRESSINGS) ×1 IMPLANT
DRSG TEGADERM 2-3/8X2-3/4 SM (GAUZE/BANDAGES/DRESSINGS) ×2 IMPLANT
ELECT REM PT RETURN 9FT ADLT (ELECTROSURGICAL) ×2
ELECTRODE REM PT RTRN 9FT ADLT (ELECTROSURGICAL) ×1 IMPLANT
ENDOLOOP SUT PDS II  0 18 (SUTURE)
ENDOLOOP SUT PDS II 0 18 (SUTURE) IMPLANT
GLOVE BIOGEL PI IND STRL 8 (GLOVE) ×1 IMPLANT
GLOVE BIOGEL PI INDICATOR 8 (GLOVE) ×1
GLOVE ECLIPSE 7.5 STRL STRAW (GLOVE) ×1 IMPLANT
GLOVE SURG SS PI 6.5 STRL IVOR (GLOVE) ×2 IMPLANT
GLOVE SURG SS PI 7.0 STRL IVOR (GLOVE) ×1 IMPLANT
GOWN STRL REUS W/ TWL LRG LVL3 (GOWN DISPOSABLE) ×3 IMPLANT
GOWN STRL REUS W/TWL LRG LVL3 (GOWN DISPOSABLE) ×6
KIT BASIN OR (CUSTOM PROCEDURE TRAY) ×2 IMPLANT
KIT ROOM TURNOVER OR (KITS) ×2 IMPLANT
LIQUID BAND (GAUZE/BANDAGES/DRESSINGS) ×1 IMPLANT
NS IRRIG 1000ML POUR BTL (IV SOLUTION) ×2 IMPLANT
PAD ARMBOARD 7.5X6 YLW CONV (MISCELLANEOUS) ×4 IMPLANT
PENCIL BUTTON HOLSTER BLD 10FT (ELECTRODE) ×1 IMPLANT
POUCH SPECIMEN RETRIEVAL 10MM (ENDOMECHANICALS) ×2 IMPLANT
RELOAD /EVU35 (ENDOMECHANICALS) IMPLANT
RELOAD CUTTER ETS 35MM STAND (ENDOMECHANICALS) IMPLANT
SCALPEL HARMONIC ACE (MISCELLANEOUS) ×2 IMPLANT
SET IRRIG TUBING LAPAROSCOPIC (IRRIGATION / IRRIGATOR) ×2 IMPLANT
SLEEVE ENDOPATH XCEL 5M (ENDOMECHANICALS) ×2 IMPLANT
SPECIMEN JAR SMALL (MISCELLANEOUS) ×2 IMPLANT
STRIP CLOSURE SKIN 1/2X4 (GAUZE/BANDAGES/DRESSINGS) ×1 IMPLANT
SUT MNCRL AB 4-0 PS2 18 (SUTURE) ×2 IMPLANT
TOWEL OR 17X24 6PK STRL BLUE (TOWEL DISPOSABLE) ×2 IMPLANT
TOWEL OR 17X26 10 PK STRL BLUE (TOWEL DISPOSABLE) ×2 IMPLANT
TRAY FOLEY CATH 16FR SILVER (SET/KITS/TRAYS/PACK) ×2 IMPLANT
TRAY LAPAROSCOPIC MC (CUSTOM PROCEDURE TRAY) ×2 IMPLANT
TROCAR XCEL BLUNT TIP 100MML (ENDOMECHANICALS) ×2 IMPLANT
TROCAR XCEL NON-BLD 5MMX100MML (ENDOMECHANICALS) ×2 IMPLANT
TUBING INSUFFLATION (TUBING) ×2 IMPLANT

## 2014-08-28 NOTE — ED Notes (Signed)
General surgeon at bedside at present time.

## 2014-08-28 NOTE — Transfer of Care (Signed)
Immediate Anesthesia Transfer of Care Note  Patient: Shelby Grant  Procedure(s) Performed: Procedure(s): APPENDECTOMY LAPAROSCOPIC (N/A)  Patient Location: PACU  Anesthesia Type:General  Level of Consciousness: awake, oriented and patient cooperative  Airway & Oxygen Therapy: Patient Spontanous Breathing and Patient connected to nasal cannula oxygen  Post-op Assessment: Report given to RN and Post -op Vital signs reviewed and stable  Post vital signs: Reviewed  Last Vitals:  Filed Vitals:   08/28/14 0458  BP: 121/72  Pulse: 81  Temp: 36.7 C  Resp: 15    Complications: No apparent anesthesia complications

## 2014-08-28 NOTE — ED Notes (Signed)
Patient completed cup of contrast.

## 2014-08-28 NOTE — Anesthesia Procedure Notes (Addendum)
Procedure Name: Intubation Date/Time: 08/28/2014 8:45 AM Performed by: Jenne Campus Pre-anesthesia Checklist: Patient identified, Emergency Drugs available, Suction available, Patient being monitored and Timeout performed Patient Re-evaluated:Patient Re-evaluated prior to inductionOxygen Delivery Method: Circle system utilized Preoxygenation: Pre-oxygenation with 100% oxygen Intubation Type: IV induction, Rapid sequence and Cricoid Pressure applied Laryngoscope Size: Miller and 2 Grade View: Grade I Tube type: Oral Tube size: 7.0 mm Number of attempts: 1 Airway Equipment and Method: Stylet Placement Confirmation: ETT inserted through vocal cords under direct vision,  positive ETCO2,  CO2 detector and breath sounds checked- equal and bilateral Secured at: 21 cm Tube secured with: Tape Dental Injury: Teeth and Oropharynx as per pre-operative assessment

## 2014-08-28 NOTE — H&P (Signed)
Shelby Grant is an 59 y.o. female.   Chief Complaint: abd painno HPI: 26 yof with pmh of svt has lower abd pain since Saturday.  This has progressed. No n/v.  Had loose stool yesterday.  She presented to urg care today due to pain and possible syncopal episode. She had hypotension there.  Transferred here for evaluation Reports she is up to date on csc.  Had ct that shows likely perf appendicitis.    Past Medical History  Diagnosis Date  . ANXIETY 06/02/2007  . BELL'S PALSY, LEFT 12/02/2008  . Supraventricular tachycardia 06/02/2007    Atrial tachycardia with Wenckebach periodicity  . ALLERGIC RHINITIS 07/03/2008  . BREAST MASS, RIGHT 06/03/2008  . IRREGULAR MENSES 06/03/2008  . CELLULITIS, LEFT LEG 09/14/2008  . CORNS AND CALLUSES 07/03/2008  . FOOT PAIN 09/22/2009  . CHEST PAIN 07/07/2009  . OCD (obsessive compulsive disorder)   . Depression   . Arthritis     Past Surgical History  Procedure Laterality Date  . Wisdom tooth extraction      Family History  Problem Relation Age of Onset  . Asthma Sister   . Cancer Mother     Marena Chancy where cancer started    Social History:  reports that she has never smoked. She has never used smokeless tobacco. She reports that she drinks alcohol. She reports that she does not use illicit drugs.  Allergies:  Allergies  Allergen Reactions  . Amoxicillin     Unknown  . Latex     REACTION: rash   meds reviewed  Results for orders placed or performed during the hospital encounter of 08/27/14 (from the past 48 hour(s))  CBC WITH DIFFERENTIAL     Status: Abnormal   Collection Time: 08/27/14 11:03 PM  Result Value Ref Range   WBC 15.5 (H) 4.0 - 10.5 K/uL   RBC 3.95 3.87 - 5.11 MIL/uL   Hemoglobin 12.2 12.0 - 15.0 g/dL   HCT 36.1 36.0 - 46.0 %   MCV 91.4 78.0 - 100.0 fL   MCH 30.9 26.0 - 34.0 pg   MCHC 33.8 30.0 - 36.0 g/dL   RDW 13.6 11.5 - 15.5 %   Platelets 210 150 - 400 K/uL   Neutrophils Relative % 85 (H) 43 - 77 %   Neutro Abs 13.3 (H) 1.7 -  7.7 K/uL   Lymphocytes Relative 9 (L) 12 - 46 %   Lymphs Abs 1.4 0.7 - 4.0 K/uL   Monocytes Relative 5 3 - 12 %   Monocytes Absolute 0.8 0.1 - 1.0 K/uL   Eosinophils Relative 1 0 - 5 %   Eosinophils Absolute 0.1 0.0 - 0.7 K/uL   Basophils Relative 0 0 - 1 %   Basophils Absolute 0.0 0.0 - 0.1 K/uL  Comprehensive metabolic panel     Status: Abnormal   Collection Time: 08/27/14 11:03 PM  Result Value Ref Range   Sodium 137 135 - 145 mmol/L   Potassium 3.5 3.5 - 5.1 mmol/L   Chloride 105 101 - 111 mmol/L   CO2 24 22 - 32 mmol/L   Glucose, Bld 110 (H) 65 - 99 mg/dL   BUN 14 6 - 20 mg/dL   Creatinine, Ser 0.74 0.44 - 1.00 mg/dL   Calcium 8.6 (L) 8.9 - 10.3 mg/dL   Total Protein 6.2 (L) 6.5 - 8.1 g/dL   Albumin 3.7 3.5 - 5.0 g/dL   AST 15 15 - 41 U/L   ALT 19 14 - 54 U/L  Alkaline Phosphatase 50 38 - 126 U/L   Total Bilirubin 0.8 0.3 - 1.2 mg/dL   GFR calc non Af Amer >60 >60 mL/min   GFR calc Af Amer >60 >60 mL/min    Comment: (NOTE) The eGFR has been calculated using the CKD EPI equation. This calculation has not been validated in all clinical situations. eGFR's persistently <60 mL/min signify possible Chronic Kidney Disease.    Anion gap 8 5 - 15  Troponin I     Status: None   Collection Time: 08/27/14 11:03 PM  Result Value Ref Range   Troponin I <0.03 <0.031 ng/mL    Comment:        NO INDICATION OF MYOCARDIAL INJURY.   CBG monitoring, ED     Status: Abnormal   Collection Time: 08/27/14 11:16 PM  Result Value Ref Range   Glucose-Capillary 110 (H) 65 - 99 mg/dL  Urinalysis, Routine w reflex microscopic (not at [facility])     Status: Abnormal   Collection Time: 08/27/14 11:20 PM  Result Value Ref Range   Color, Urine YELLOW YELLOW   APPearance CLEAR CLEAR   Specific Gravity, Urine 1.020 1.005 - 1.030   pH 6.5 5.0 - 8.0   Glucose, UA NEGATIVE NEGATIVE mg/dL   Hgb urine dipstick NEGATIVE NEGATIVE   Bilirubin Urine NEGATIVE NEGATIVE   Ketones, ur 15 (A) NEGATIVE mg/dL    Protein, ur NEGATIVE NEGATIVE mg/dL   Urobilinogen, UA 0.2 0.0 - 1.0 mg/dL   Nitrite NEGATIVE NEGATIVE   Leukocytes, UA NEGATIVE NEGATIVE    Comment: MICROSCOPIC NOT DONE ON URINES WITH NEGATIVE PROTEIN, BLOOD, LEUKOCYTES, NITRITE, OR GLUCOSE <1000 mg/dL.   Dg Chest 2 View  08/27/2014   CLINICAL DATA:  Syncope.  Abdominal pain.  EXAM: CHEST  2 VIEW  COMPARISON:  11/03/2013  FINDINGS: The cardiomediastinal contours are normal. Mild hyperinflation and emphysematous change, similar to prior exam. Pulmonary vasculature is normal. No consolidation, pleural effusion, or pneumothorax. No acute osseous abnormalities are seen.  IMPRESSION: No acute pulmonary process.   Electronically Signed   By: Jeb Levering M.D.   On: 08/27/2014 23:04   Ct Abdomen Pelvis W Contrast  08/28/2014   CLINICAL DATA:  Abdominal pain for 3 days. Intermittent diarrhea, nausea and weakness. Syncopal episode with loss of consciousness.  EXAM: CT ABDOMEN AND PELVIS WITH CONTRAST  TECHNIQUE: Multidetector CT imaging of the abdomen and pelvis was performed using the standard protocol following bolus administration of intravenous contrast.  CONTRAST:  177m OMNIPAQUE IOHEXOL 300 MG/ML  SOLN  COMPARISON:  None.  FINDINGS: Lower chest:  The included lung bases are clear.  Hepatobiliary: Scattered small subcentimeter hypodensities, suggestive of cysts or hemangiomas. There is a 1.8 cm cyst in the subcapsular right lobe. Gallbladder is decompressed. No biliary dilatation.  Pancreas: Normal.  Spleen: Normal.  Adrenals/Urinary Tract: No adrenal nodule.An 11 mm cortical lesion in the interpolar left kidney with Hounsfield units of 70. Mild prominence of the left proximal ureter without cause for obstruction. No associated hydronephrosis. Right kidney is normal.  Stomach/Bowel: The appendix tentatively identified and appears abnormal with an appendicolith at the base. The appendix distal to this is dilated and fluid-filled measuring 11 mm. The tip  of the appendix poorly defined, and there are questionable of ill-defined fluid packets insinuating about pelvic bowel loops. Fluid pack 0 with small focus of air is not definitively intraluminal, image 50/87. Small bowel loops in the pelvis are normal in caliber but fluid-filled. Stomach is physiologically distended. The  colon is decompressed, there is equivocal wall thickening of the distal descending and proximal sigmoid colon.  Vascular/Lymphatic: Normal caliber abdominal aorta. No significant atherosclerosis. No retroperitoneal adenopathy.  Reproductive: Uterus is normal. The adnexa are suboptimally defined. Small amount of free fluid in the pelvis.  Bladder: Physiologically distended.  Musculoskeletal: There are no acute or suspicious osseous abnormalities.  IMPRESSION: 1. Constellation of findings concerning for acute appendicitis with early perforation. The tentatively identified appendix is dilated with a appendicolith at the base, and small adjacent fluid pockets that appear extraluminal in the right lower quadrant. 2. Indeterminate 11 mm left renal lesion. Recommend further characterization with renal protocol MRI without with contrast after acute incident has resolved. 3. Scattered biliary cysts or hamartomas. These preliminary results were called by telephone at the time of interpretation on 08/28/2014 at 1:03 am to Dr. Pryor Curia , who verbally acknowledged these results.   Electronically Signed   By: Jeb Levering M.D.   On: 08/28/2014 01:03    Review of Systems  Respiratory: Negative for shortness of breath.   Cardiovascular: Negative for chest pain.  Gastrointestinal: Positive for diarrhea. Negative for vomiting and abdominal pain.    Blood pressure 121/66, pulse 88, temperature 98.9 F (37.2 C), temperature source Oral, resp. rate 20, height 5' 7"  (1.702 m), weight 59.648 kg (131 lb 8 oz), SpO2 99 %. Physical Exam  Vitals reviewed. Constitutional: She is oriented to person, place,  and time. She appears well-developed and well-nourished.  Eyes: No scleral icterus.  Neck: Neck supple.  Cardiovascular: Normal rate, regular rhythm and normal heart sounds.   Respiratory: Effort normal and breath sounds normal. She has no wheezes. She has no rales.  GI: Bowel sounds are normal. She exhibits no distension. There is tenderness in the right lower quadrant and left lower quadrant.  Lymphadenopathy:    She has no cervical adenopathy.  Neurological: She is alert and oriented to person, place, and time.     Assessment/Plan Likely perforated appendicitis She is Raymond Gurney witness and will not take blood products under any circumstance. Will admit, abx, npo. Discussed appy now vs interval appy and reevaluation in am  [facility] 08/28/2014, 2:25 AM

## 2014-08-28 NOTE — Op Note (Signed)
OPERATIVE REPORT  DATE OF OPERATION:  08/28/2014  PATIENT:  Shelby Grant  59 y.o. female  PRE-OPERATIVE DIAGNOSIS:  appendicitis  POST-OPERATIVE DIAGNOSIS:  Appendicitis without overt rupture  PROCEDURE:  Procedure(s): APPENDECTOMY LAPAROSCOPIC  SURGEON:  Surgeon(s): Judeth Horn, MD  ASSISTANT: None  ANESTHESIA:   general  EBL: <20 ml  BLOOD ADMINISTERED: none  DRAINS: none   SPECIMEN:  Source of Specimen:  Appendix  COUNTS CORRECT:  YES  PROCEDURE DETAILS: To the operating room and placed on the table in supine position. After an adequate general endotracheal anesthesia was administered she was prepped and draped in the usual sterile manner exposing her abdomen from the level of the xiphoid process down to the pubic crest.  A proper timeout was performed identifying the patient and procedure to be performed. A supraumbilical midline incision was made down to the midline fascia which was subsequently incised with a 15 blade. We bluntly dissected down into the peritoneal cavity once we were inside the cavity a pursestring suture of 0 Vicryl was passed around the fascial opening. This secured in place a Hassan cannula which was subsequently used to insufflate carbon dioxide gas up to a maximal intra-abdominal pressure of 15 mmHg.  Under direct vision a right upper quadrant 5 mm cannula and a left lower quadrant 5 mm cannula passed into the peritoneal cavity. The patient was placed in Trendelenburg position and the left side was tilted down. The base of the cecum wasn't identified initially and then the appendix found tethered to an inflammatory mass in the right lower quadrant. We were able to dissect out the appendix from the base of the cecum and come across with an Endo GIA blue cartridge stapler. The appendix was taken down using a Harmonic Scalpel. There was minimal bleeding.  Once the appendix was completely detached it was retrieved from the abdomen using an Endo Catch bag  brought out from the supraumbilical cannula site. We subsequently inspected for further bleeding and none was noted. We irrigated with over a liter saline solution, inspected the area of the staple line, inspected the mesentery, and then closed.  All fluid and gas was aspirated from above the liver. The patient was placed in the neutral position. 0.25% Marcaine with epinephrine was injected at all incision sites. We closed the supraumbilical skin site only using a running subcuticular stitch of 4-0 Monocryl. The other 5 mm sites were closed primarily with Dermabond. Dermabond, Steri-Strips, and Tegaderms are used to complete the dressing.  All needle counts, sponge counts, and instrument counts were correct.  PATIENT DISPOSITION:  PACU - hemodynamically stable.   Nikol Lemar 7/27/20169:51 AM

## 2014-08-28 NOTE — Progress Notes (Signed)
Patient still very tender in the lower abdomen.  I believe that she will benefit from removal of her appendix.  She will not get any blood or blood products.  I believe that we should be able to do this laparoscopically.  Kathryne Eriksson. Dahlia Bailiff, MD, [place] [phone] [facility]

## 2014-08-28 NOTE — Anesthesia Preprocedure Evaluation (Signed)
Anesthesia Evaluation  Patient identified by MRN, date of birth, ID band Patient awake    Reviewed: Allergy & Precautions, NPO status , Patient's Chart, lab work & pertinent test results  Airway Mallampati: II  TM Distance: >3 FB Neck ROM: Full    Dental no notable dental hx.    Pulmonary neg pulmonary ROS,  breath sounds clear to auscultation  Pulmonary exam normal       Cardiovascular negative cardio ROS Normal cardiovascular examRhythm:Regular Rate:Normal     Neuro/Psych PSYCHIATRIC DISORDERS Anxiety Depression negative neurological ROS     GI/Hepatic negative GI ROS, Neg liver ROS,   Endo/Other  negative endocrine ROS  Renal/GU negative Renal ROS     Musculoskeletal  (+) Arthritis -,   Abdominal   Peds  Hematology negative hematology ROS (+)   Anesthesia Other Findings   Reproductive/Obstetrics negative OB ROS                             Anesthesia Physical Anesthesia Plan  ASA: II  Anesthesia Plan: General   Post-op Pain Management:    Induction: Intravenous and Rapid sequence  Airway Management Planned: Oral ETT  Additional Equipment:   Intra-op Plan:   Post-operative Plan: Extubation in OR  Informed Consent: I have reviewed the patients History and Physical, chart, labs and discussed the procedure including the risks, benefits and alternatives for the proposed anesthesia with the patient or authorized representative who has indicated his/her understanding and acceptance.   Dental advisory given  Plan Discussed with: CRNA  Anesthesia Plan Comments:         Anesthesia Quick Evaluation

## 2014-08-28 NOTE — Anesthesia Postprocedure Evaluation (Signed)
Anesthesia Post Note  Patient: Shelby Grant  Procedure(s) Performed: Procedure(s) (LRB): APPENDECTOMY LAPAROSCOPIC (N/A)  Anesthesia type: General  Patient location: PACU  Post pain: Pain level controlled  Post assessment: Post-op Vital signs reviewed  Last Vitals: BP 116/67 mmHg  Pulse 93  Temp(Src) 36.6 C (Oral)  Resp 18  Ht 5\' 7"  (1.702 m)  Wt 131 lb 8 oz (59.648 kg)  BMI 20.59 kg/m2  SpO2 100%  Post vital signs: Reviewed  Level of consciousness: sedated  Complications: Bilateral anterior TMJ dislocation, presumably due to intubation. Easily reduced in PACU at bedside. Ace wrap to hold jaw in place. No other apparent anesthesia complications

## 2014-08-29 ENCOUNTER — Encounter (HOSPITAL_COMMUNITY): Payer: Self-pay | Admitting: General Surgery

## 2014-08-29 MED ORDER — OXYCODONE-ACETAMINOPHEN 5-325 MG PO TABS: 1.0000 | ORAL_TABLET | Freq: Four times a day (QID) | ORAL | Status: DC | PRN

## 2014-08-29 NOTE — Progress Notes (Signed)
Discharged home accompanied by sister.

## 2014-08-29 NOTE — Discharge Instructions (Signed)
Your appointment is at 3:00pm, please arrive at least 30 min before your appointment to complete your check in paperwork.  If you are unable to arrive 30 min prior to your appointment time we may have to cancel or reschedule you.  LAPAROSCOPIC SURGERY: POST OP INSTRUCTIONS  1. DIET: Follow a light bland diet the first 24 hours after arrival home, such as soup, liquids, crackers, etc. Be sure to include lots of fluids daily. Avoid fast food or heavy meals as your are more likely to get nauseated. Eat a low fat the next few days after surgery.  2. Take your usually prescribed home medications unless otherwise directed. 3. PAIN CONTROL:  1. Pain is best controlled by a usual combination of three different methods TOGETHER:  1. Ice/Heat 2. Over the counter pain medication 3. Prescription pain medication 2. Most patients will experience some swelling and bruising around the incisions. Ice packs or heating pads (30-60 minutes up to 6 times a day) will help. Use ice for the first few days to help decrease swelling and bruising, then switch to heat to help relax tight/sore spots and speed recovery. Some people prefer to use ice alone, heat alone, alternating between ice & heat. Experiment to what works for you. Swelling and bruising can take several weeks to resolve.  3. It is helpful to take an over-the-counter pain medication regularly for the first few weeks. Choose one of the following that works best for you:  1. Naproxen (Aleve, etc) Two 220mg  tabs twice a day 2. Ibuprofen (Advil, etc) Three 200mg  tabs four times a day (every meal & bedtime) 3. Acetaminophen (Tylenol, etc) 500-650mg  four times a day (every meal & bedtime) 4. A prescription for pain medication (such as oxycodone, hydrocodone, etc) should be given to you upon discharge. Take your pain medication as prescribed.  1. If you are having problems/concerns with the prescription medicine (does not control pain, nausea, vomiting, rash, itching,  etc), please call us [phone] to see if we need to switch you to a different pain medicine that will work better for you and/or control your side effect better. 2. If you need a refill on your pain medication, please contact your pharmacy. They will contact our office to request authorization. Prescriptions will not be filled after 5 pm or on week-ends. 4. Avoid getting constipated. Between the surgery and the pain medications, it is common to experience some constipation. Increasing fluid intake and taking a fiber supplement (such as Metamucil, Citrucel, FiberCon, MiraLax, etc) 1-2 times a day regularly will usually help prevent this problem from occurring. A mild laxative (prune juice, Milk of Magnesia, MiraLax, etc) should be taken according to package directions if there are no bowel movements after 48 hours.  5. Watch out for diarrhea. If you have many loose bowel movements, simplify your diet to bland foods & liquids for a few days. Stop any stool softeners and decrease your fiber supplement. Switching to mild anti-diarrheal medications (Kayopectate, Pepto Bismol) can help. If this worsens or does not improve, please call us. 6. Wash / shower every day. You may shower over the dressings as they are waterproof. Continue to shower over incision(s) after the dressing is off. 7. Remove your waterproof bandages 5 days after surgery. You may leave the incision open to air. You may replace a dressing/Band-Aid to cover the incision for comfort if you wish.  8. ACTIVITIES as tolerated:  1. You may resume regular (light) daily activities beginning the next day--such as  walking, climbing stairs--gradually increasing activities as tolerated. If you can walk 30 minutes without difficulty, it is safe to try more intense activity such as jogging, treadmill, bicycling, low-impact aerobics, swimming, etc. °2. Save the most intensive and strenuous activity for last such as sit-ups, heavy lifting,  contact sports, etc Refrain from any heavy lifting or straining until you are off narcotics for pain control.  °3. DO NOT PUSH THROUGH PAIN. Let pain be your guide: If it hurts to do something, don't do it. Pain is your body warning you to avoid that activity for another week until the pain goes down. °4. You may drive when you are no longer taking prescription pain medication, you can comfortably wear a seatbelt, and you can safely maneuver your car and apply brakes. °5. You may have sexual intercourse when it is comfortable.  °9. FOLLOW UP in our office  °1. Please call [facility] at [phone] to set up an appointment to see your surgeon in the office for a follow-up appointment approximately 2-3 weeks after your surgery. °2. Make sure that you call for this appointment the day you arrive home to insure a convenient appointment time. °     10. IF YOU HAVE DISABILITY OR FAMILY LEAVE FORMS, BRING THEM TO THE               OFFICE FOR PROCESSING.  ° °WHEN TO CALL US [phone]:  °1. Poor pain control °2. Reactions / problems with new medications (rash/itching, nausea, etc)  °3. Fever over 101.5 F (38.5 C) °4. Inability to urinate °5. Nausea and/or vomiting °6. Worsening swelling or bruising °7. Continued bleeding from incision. °8. Increased pain, redness, or drainage from the incision ° °The clinic staff is available to answer your questions during regular business hours (8:30am-5pm). Please don’t hesitate to call and ask to speak to one of our nurses for clinical concerns.  °If you have a medical emergency, go to the nearest emergency room or call 911.  °A surgeon from [facility] is always on call at the hospitals  ° °[facility]  °[address], [address], [place], [place] [address] ?  °MAIN: [phone] ? TOLL FREE: [phone] ?  °FAX [phone]  °[email or web address] ° °

## 2014-08-29 NOTE — Discharge Summary (Signed)
[facility] Discharge Summary   Patient ID: Shelby Grant MRN: [id number] DOB/AGE: 04/02/1955 59 y.o.  Admit date: 08/27/2014 Discharge date: 08/29/2014  Admitting Diagnosis: Acute appendicitis ?perforation   Discharge Diagnosis Patient Active Problem List   Diagnosis Date Noted  . Acute appendicitis 08/28/2014  . Acute cystitis 12/11/2013  . Bruising 07/08/2011  . Corn 05/11/2011  . Abdominal pain 05/11/2011  . Menopause syndrome 02/11/2011  . Hemorrhagic cystitis 12/22/2010  . Cervical polyp 06/08/2010  . BELL'S PALSY, LEFT 12/02/2008  . ALLERGIC RHINITIS 07/03/2008  . BREAST MASS, RIGHT 06/03/2008  . Supraventricular tachycardia 06/02/2007  . ANXIETY 06/02/2007    Consultants None  Imaging: Dg Facial Bones 1-2 Views  08/28/2014   CLINICAL DATA:  Mandible an jaws remain open and fixed. Inability to close mouth.  EXAM: FACIAL BONES - 1-2 VIEW  COMPARISON:  None.  FINDINGS: The mandibular heads are suspected to be dislocated anteriorly with respect to the socket of the TMJs bilaterally. No obvious fracture is present.  IMPRESSION: Bilateral anterior TMJ dislocation is suspected. This can be confirmed with CT. No obvious fracture is seen. Occult fracture can be better evaluated by CT.   Electronically Signed   By: Marybelle Killings M.D.   On: 08/28/2014 10:59   Dg Chest 2 View  08/27/2014   CLINICAL DATA:  Syncope.  Abdominal pain.  EXAM: CHEST  2 VIEW  COMPARISON:  11/03/2013  FINDINGS: The cardiomediastinal contours are normal. Mild hyperinflation and emphysematous change, similar to prior exam. Pulmonary vasculature is normal. No consolidation, pleural effusion, or pneumothorax. No acute osseous abnormalities are seen.  IMPRESSION: No acute pulmonary process.   Electronically Signed   By: Jeb Levering M.D.   On: 08/27/2014 23:04   Ct Abdomen Pelvis W Contrast  08/28/2014   CLINICAL DATA:  Abdominal pain for 3 days. Intermittent diarrhea, nausea and weakness.  Syncopal episode with loss of consciousness.  EXAM: CT ABDOMEN AND PELVIS WITH CONTRAST  TECHNIQUE: Multidetector CT imaging of the abdomen and pelvis was performed using the standard protocol following bolus administration of intravenous contrast.  CONTRAST:  163mL OMNIPAQUE IOHEXOL 300 MG/ML  SOLN  COMPARISON:  None.  FINDINGS: Lower chest:  The included lung bases are clear.  Hepatobiliary: Scattered small subcentimeter hypodensities, suggestive of cysts or hemangiomas. There is a 1.8 cm cyst in the subcapsular right lobe. Gallbladder is decompressed. No biliary dilatation.  Pancreas: Normal.  Spleen: Normal.  Adrenals/Urinary Tract: No adrenal nodule.An 11 mm cortical lesion in the interpolar left kidney with Hounsfield units of 70. Mild prominence of the left proximal ureter without cause for obstruction. No associated hydronephrosis. Right kidney is normal.  Stomach/Bowel: The appendix tentatively identified and appears abnormal with an appendicolith at the base. The appendix distal to this is dilated and fluid-filled measuring 11 mm. The tip of the appendix poorly defined, and there are questionable of ill-defined fluid packets insinuating about pelvic bowel loops. Fluid pack 0 with small focus of air is not definitively intraluminal, image 50/87. Small bowel loops in the pelvis are normal in caliber but fluid-filled. Stomach is physiologically distended. The colon is decompressed, there is equivocal wall thickening of the distal descending and proximal sigmoid colon.  Vascular/Lymphatic: Normal caliber abdominal aorta. No significant atherosclerosis. No retroperitoneal adenopathy.  Reproductive: Uterus is normal. The adnexa are suboptimally defined. Small amount of free fluid in the pelvis.  Bladder: Physiologically distended.  Musculoskeletal: There are no acute or suspicious osseous abnormalities.  IMPRESSION: 1. Constellation of findings  concerning for acute appendicitis with early perforation. The  tentatively identified appendix is dilated with a appendicolith at the base, and small adjacent fluid pockets that appear extraluminal in the right lower quadrant. 2. Indeterminate 11 mm left renal lesion. Recommend further characterization with renal protocol MRI without with contrast after acute incident has resolved. 3. Scattered biliary cysts or hamartomas. These preliminary results were called by telephone at the time of interpretation on 08/28/2014 at 1:03 am to Dr. Pryor Curia , who verbally acknowledged these results.   Electronically Signed   By: Jeb Levering M.D.   On: 08/28/2014 01:03    Procedures Dr. Hulen Skains (08/29/14) - Laparoscopic Appendectomy  Hospital Course:  59 y/o white female with PMH of svt has lower abd pain since Saturday. This has progressed. No N/V. Had loose stool yesterday. She presented to urg care 08/28/14 due to pain and possible syncopal episode.  She had hypotension there. Transferred to [facility] for evaluation.  Had CT that shows likely perf appendicitis. She is Jehovah's wittness and will not accept blood products.  Workup concerning for perforated appendicitis, she was still very tender in the am and we recommended lap appy.  Patient was admitted and underwent procedure listed above.  She experienced a bilateral TMJ joint dislocation during the intubation.  Anesthesia called oral surgery and the patients dentist, but was able to get the patient reduced successfully.  Luckily no appendix perforation was found intra-operatively.  Tolerated procedure well and was transferred to the floor.  She was maintained on 24 hours of antibiotics.  Diet was advanced as tolerated.  She is eating well, and not complaining of any jaw pain/tenderness or trouble chewing.  On POD #1, the patient was voiding well, tolerating diet, ambulating well, pain well controlled, vital signs stable, incisions c/d/i and felt stable for discharge home.  Patient will follow up in our office in 2-3 weeks  and knows to call with questions or concerns.  She is advised to follow up with her dentist if she would develop any discomfort from her jaw.  They can always refer to a oral surgeon if needed.     Physical Exam: General:  Alert, NAD, pleasant, comfortable Abd:  Soft, mildly distended, mild tenderness, incisions C/D/I (4 incisions sites)     Medication List    TAKE these medications        bifidobacterium infantis capsule  Take 1 capsule by mouth daily.     buPROPion 300 MG 24 hr tablet  Commonly known as:  WELLBUTRIN XL  take 1 tablet by mouth once daily     clonazePAM 1 MG tablet  Commonly known as:  KLONOPIN  take 1 tablet by mouth once daily  (NEED TO HAVE OFFICE VISIT FOR MORE REFILLS)     metoprolol succinate 25 MG 24 hr tablet  Commonly known as:  TOPROL-XL  take 1/2 tablet by mouth twice a day     oxyCODONE-acetaminophen 5-325 MG per tablet  Commonly known as:  PERCOCET/ROXICET  Take 1-2 tablets by mouth every 6 (six) hours as needed for moderate pain.         Follow-up Information    Follow up with [facility] On 09/17/2014.   Why:  For post-operation check at 3:00pm, please arrive by 2:30pm to check in and fill out paperwork.   Contact information:   [place] [phone]      Schedule an appointment as soon as possible for  a visit in 2 weeks to follow up.   Why:  FOLLOW UP WITH YOUR DENTIST REGARDING YOUR JAW., For post-hospital follow up      Signed: Nat Christen, [facility] [phone]  08/29/2014, 9:07 AM

## 2014-09-03 ENCOUNTER — Ambulatory Visit (INDEPENDENT_AMBULATORY_CARE_PROVIDER_SITE_OTHER): Payer: No Typology Code available for payment source | Admitting: Emergency Medicine

## 2014-09-03 VITALS — BP 110/70 | HR 78 | Temp 98.4°F | Resp 16 | Ht 66.5 in | Wt 130.0 lb

## 2014-09-03 DIAGNOSIS — T801XXA Vascular complications following infusion, transfusion and therapeutic injection, initial encounter: Secondary | ICD-10-CM | POA: Diagnosis not present

## 2014-09-03 DIAGNOSIS — I809 Phlebitis and thrombophlebitis of unspecified site: Secondary | ICD-10-CM

## 2014-09-03 NOTE — Patient Instructions (Signed)
Phlebitis Phlebitis is soreness and swelling (inflammation) of a vein. This can occur in your arms, legs, or torso (trunk), as well as deeper inside your body. Phlebitis is usually not serious when it occurs close to the surface of the body. However, it can cause serious problems when it occurs in a vein deeper inside the body. CAUSES  Phlebitis can be triggered by various things, including:   Reduced blood flow through your veins. This can happen with:  Bed rest over a long period.  Long-distance travel.  Injury.  Surgery.  Being overweight (obese) or pregnant.  Having an IV tube put in the vein and getting certain medicines through the vein.  Cancer and cancer treatment.  Use of illegal drugs taken through the vein.  Inflammatory diseases.  Inherited (genetic) diseases that increase the risk of blood clots.  Hormone therapy, such as birth control pills. SIGNS AND SYMPTOMS   Red, tender, swollen, and painful area on your skin. Usually, the area will be long and narrow.  Firmness along the center of the affected area. This can indicate that a blood clot has formed.  Low-grade fever. DIAGNOSIS  A health care provider can usually diagnose phlebitis by examining the affected area and asking about your symptoms. To check for infection or blood clots, your health care provider may order blood tests or an ultrasound exam of the area. Blood tests and your family history may also indicate if you have an underlying genetic disease that causes blood clots. Occasionally, a piece of tissue is taken from the body (biopsy sample) if an unusual cause of phlebitis is suspected. TREATMENT  Treatment will vary depending on the severity of the condition and the area of the body affected. Treatment may include:  Use of a warm compress or heating pad.  Use of compression stockings or bandages.  Anti-inflammatory medicines.  Removal of any IV tube that may be causing the problem.  Medicines  that kill germs (antibiotics) if an infection is present.  Blood-thinning medicines if a blood clot is suspected or present.  In rare cases, surgery may be needed to remove damaged sections of vein. HOME CARE INSTRUCTIONS   Only take over-the-counter or prescription medicines as directed by your health care provider. Take all medicines exactly as prescribed.  Raise (elevate) the affected area above the level of your heart as directed by your health care provider.  Apply a warm compress or heating pad to the affected area as directed by your health care provider. Do not sleep with the heating pad.  Use compression stockings or bandages as directed. These will speed healing and prevent the condition from coming back.  If you are on blood thinners:  Get follow-up blood tests as directed by your health care provider.  Check with your health care provider before using any new medicines.  Carry a medical alert card or wear your medical alert jewelry to show that you are on blood thinners.  For phlebitis in the legs:  Avoid prolonged standing or bed rest.  Keep your legs moving. Raise your legs when sitting or lying.  Do not smoke.  Women, particularly those over the age of 35, should consider the risks and benefits of taking the contraceptive pill. This kind of hormone treatment can increase your risk for blood clots.  Follow up with your health care provider as directed. SEEK MEDICAL CARE IF:   You have unusual bruising or any bleeding problems.  Your swelling or pain in the affected area   is not improving. °· You are on anti-inflammatory medicine, and you develop belly (abdominal) pain. °SEEK IMMEDIATE MEDICAL CARE IF:  °· You have a sudden onset of chest pain or difficulty breathing. °· You have a fever or persistent symptoms for more than 2-3 days. °· You have a fever and your symptoms suddenly get worse. °MAKE SURE YOU: °· Understand these instructions. °· Will watch your  condition. °· Will get help right away if you are not doing well or get worse. °Document Released: 01/12/2001 Document Revised: 11/08/2012 Document Reviewed: 09/25/2012 °ExitCare® Patient Information ©2015 [employer]. This information is not intended to replace advice given to you by your health care provider. Make sure you discuss any questions you have with your health care provider. ° °

## 2014-09-03 NOTE — Progress Notes (Signed)
Subjective:  Patient ID: Shelby Grant, female    DOB: 07-Jun-1955  Age: 59 y.o. MRN: [id number]  CC: Hospitalization Follow-up   HPI [name] presents  a complication of an IV infusion her left antecubital space. She underwent an emergent appendectomy on 7/26 and the last several days has noticed an increase swelling and tenderness in her left antecubital space. She has a hematoma there as well as tender palpable cord upper arm. For about an inch and a half above the IV site. She has no fever or chills. Regarding her surgery she is eating and drinking normally and has a normal appetite. She says stated that she is a voiding and stooling normally. She has no drainage or erythema surrounding her surgical sites.  History Shelby Grant has a past medical history of ANXIETY (06/02/2007); BELL'S PALSY, LEFT (12/02/2008); Supraventricular tachycardia (06/02/2007); ALLERGIC RHINITIS (07/03/2008); BREAST MASS, RIGHT (06/03/2008); IRREGULAR MENSES (06/03/2008); CELLULITIS, LEFT LEG (09/14/2008); CORNS AND CALLUSES (07/03/2008); FOOT PAIN (09/22/2009); CHEST PAIN (07/07/2009); OCD (obsessive compulsive disorder); Depression; and Arthritis.   She has past surgical history that includes Wisdom tooth extraction and laparoscopic appendectomy (N/A, 08/28/2014).   Her  family history includes Asthma in her sister; Cancer in her mother.  She   reports that she has never smoked. She has never used smokeless tobacco. She reports that she drinks alcohol. She reports that she does not use illicit drugs.  Outpatient Prescriptions Prior to Visit  Medication Sig Dispense Refill  . bifidobacterium infantis (ALIGN) capsule Take 1 capsule by mouth daily. 14 capsule 0  . buPROPion (WELLBUTRIN XL) 300 MG 24 hr tablet take 1 tablet by mouth once daily 100 tablet 3  . clonazePAM (KLONOPIN) 1 MG tablet take 1 tablet by mouth once daily  (NEED TO HAVE OFFICE VISIT FOR MORE REFILLS) (Patient taking differently: Take 0.25-0.5 mg by mouth  at bedtime. ) 90 tablet 3  . metoprolol succinate (TOPROL-XL) 25 MG 24 hr tablet take 1/2 tablet by mouth twice a day (Patient taking differently: Take 12.5 mg by mouth at bedtime. ) 100 tablet 3  . oxyCODONE-acetaminophen (PERCOCET/ROXICET) 5-325 MG per tablet Take 1-2 tablets by mouth every 6 (six) hours as needed for moderate pain. (Patient not taking: Reported on 09/03/2014) 40 tablet 0   No facility-administered medications prior to visit.    History   Social History  . Marital Status: Married    Spouse Name: N/A  . Number of Children: N/A  . Years of Education: N/A   Occupational History  . Business Owner     Social History Main Topics  . Smoking status: Never Smoker   . Smokeless tobacco: Never Used  . Alcohol Use: Yes     Comment: social  . Drug Use: No  . Sexual Activity: Yes     Comment: married   Other Topics Concern  . None   Social History Narrative   No caffeine      Review of Systems  Constitutional: Negative for fever, chills and appetite change.  HENT: Negative for congestion, ear pain, postnasal drip, sinus pressure and sore throat.   Eyes: Negative for pain and redness.  Respiratory: Negative for cough, shortness of breath and wheezing.   Cardiovascular: Negative for leg swelling.  Gastrointestinal: Negative for nausea, vomiting, abdominal pain, diarrhea, constipation and blood in stool.  Endocrine: Negative for polyuria.  Genitourinary: Negative for dysuria, urgency, frequency and flank pain.  Musculoskeletal: Negative for gait problem.  Skin: Negative for rash.  Neurological:  Negative for weakness and headaches.  Psychiatric/Behavioral: Negative for confusion and decreased concentration. The patient is not nervous/anxious.     Objective:  BP 110/70 mmHg  Pulse 78  Temp(Src) 98.4 F (36.9 C) (Oral)  Resp 16  Ht 5' 6.5" (1.689 m)  Wt 130 lb (58.968 kg)  BMI 20.67 kg/m2  SpO2 98%  Physical Exam  Constitutional: She is oriented to person,  place, and time. She appears well-developed and well-nourished.  HENT:  Head: Normocephalic and atraumatic.  Eyes: Conjunctivae are normal. Pupils are equal, round, and reactive to light.  Pulmonary/Chest: Effort normal.  Musculoskeletal: She exhibits no edema.  Neurological: She is alert and oriented to person, place, and time.  Skin: Skin is warm and dry. No erythema.  Psychiatric: She has a normal mood and affect. Her behavior is normal. Thought content normal.   patient had a under palpable cord in her left antecubital space proximal to the site of an IV. There is no evidence cellulitis or erythema. Certainly no drainage    Assessment & Plan:   Arfa was seen today for hospitalization follow-up.  Diagnoses and all orders for this visit:  Phlebitis after infusion, initial encounter   I am having Shelby Grant maintain her bifidobacterium infantis, buPROPion, clonazePAM, metoprolol succinate, and oxyCODONE-acetaminophen.  No orders of the defined types were placed in this encounter.    Appropriate red flag conditions were discussed with the patient as well as actions that should be taken.  Patient expressed his understanding.  Follow-up: Return if symptoms worsen or fail to improve.  Shelby Culver, MD

## 2014-09-12 ENCOUNTER — Telehealth: Payer: Self-pay | Admitting: Family Medicine

## 2014-09-12 DIAGNOSIS — D229 Melanocytic nevi, unspecified: Secondary | ICD-10-CM

## 2014-09-12 DIAGNOSIS — H539 Unspecified visual disturbance: Secondary | ICD-10-CM

## 2014-09-12 NOTE — Telephone Encounter (Signed)
Please assist

## 2014-09-12 NOTE — Telephone Encounter (Signed)
Pt call to ask for a referral to go to [facility] for a spot on her face.

## 2014-09-12 NOTE — Telephone Encounter (Signed)
Pt call and now she need a referral to see her eye doctor for eye exam   Dr Marica Otter of  [facility]

## 2014-09-12 NOTE — Telephone Encounter (Signed)
Once she has recovered from surgery, I would advise her to follow up with an office visit to discuss. Lesion is very small at right at a cm but will need repeat imaging- this is not a rush- have her heal up then get in to see one of providers here.

## 2014-09-12 NOTE — Telephone Encounter (Signed)
Referral placed.

## 2014-09-12 NOTE — Telephone Encounter (Signed)
Pt mentioned that when her CT was done through the ER there was mention of a lesion on her kidney and that she should follow upon that post surgery. Since surgery was of an emergent nature, who will set up her follow though?

## 2014-09-15 ENCOUNTER — Other Ambulatory Visit: Payer: Self-pay | Admitting: Family Medicine

## 2014-09-16 NOTE — Telephone Encounter (Signed)
Spoke with patient and a follow up appointment made with Dr Sherren Mocha

## 2014-10-15 ENCOUNTER — Encounter: Payer: Self-pay | Admitting: Family Medicine

## 2014-10-15 ENCOUNTER — Ambulatory Visit (INDEPENDENT_AMBULATORY_CARE_PROVIDER_SITE_OTHER): Payer: No Typology Code available for payment source | Admitting: Family Medicine

## 2014-10-15 VITALS — BP 110/80 | Temp 96.3°F | Wt 133.0 lb

## 2014-10-15 DIAGNOSIS — Z23 Encounter for immunization: Secondary | ICD-10-CM | POA: Diagnosis not present

## 2014-10-15 DIAGNOSIS — N289 Disorder of kidney and ureter, unspecified: Secondary | ICD-10-CM | POA: Insufficient documentation

## 2014-10-15 NOTE — Progress Notes (Signed)
Pre visit review using our clinic review tool, if applicable. No additional management support is needed unless otherwise documented below in the visit note. 

## 2014-10-15 NOTE — Patient Instructions (Signed)
Use small amounts of the steroid cream twice daily for the skin rash on your lower leg.  We'll set you up for a scan as directed by radiology for further evaluation of the lesion in that left kidney.

## 2014-10-15 NOTE — Progress Notes (Signed)
   Subjective:    Patient ID: Shelby Grant, female    DOB: 12/29/1955, 59 y.o.   MRN: [id number]  HPI Taylah is a 59 year old married female nonsmoker who comes in today for follow-up having had acute appendicitis with peritonitis in July 26 of this year. She presented with acute abdominal pain and peritonitis. She had a ruptured appendix. Also an incidental finding of a 11 mm mass in her left kidney was discovered. She has no history of renal disease person however mother was felt to have renal cell carcinoma. She also had some cystic lesions in her liver but otherwise CT scan of her abdomen was negative. She was treated for the appendicitis recovered without sequelae.  She also has a rash on her leg from a bug bite 4 [place] ago at the beach.  She also has upper abdominal pain when she stressed.   Review of Systems Review of systems otherwise negative except she's going to see Dr. Delilah Shan in orthopedics for evaluation of left ankle pain    Objective:   Physical Exam Well-developed well nourished female no acute distress vital signs stable she's afebrile  Examination lower 70 shows a photosensitivity like dermatitis.       Assessment & Plan:  Status post appendicitis with a incidental finding of a 11 mm lesion in her left kidney with recommendations by radiology for follow-up MRI....... schedule MRI per direction of radiologist  Status post appendicitis with rupture  Left ankle pain....... followed by orthopedist  Rash lower extremity....... treat symptomatically with cortisone cream

## 2014-11-04 ENCOUNTER — Other Ambulatory Visit: Payer: Self-pay | Admitting: Family Medicine

## 2014-11-04 DIAGNOSIS — N289 Disorder of kidney and ureter, unspecified: Secondary | ICD-10-CM

## 2014-11-05 ENCOUNTER — Other Ambulatory Visit: Payer: No Typology Code available for payment source

## 2014-11-05 ENCOUNTER — Inpatient Hospital Stay: Admission: RE | Admit: 2014-11-05 | Payer: No Typology Code available for payment source | Source: Ambulatory Visit

## 2014-11-12 ENCOUNTER — Telehealth: Payer: Self-pay

## 2014-11-12 ENCOUNTER — Ambulatory Visit
Admission: RE | Admit: 2014-11-12 | Discharge: 2014-11-12 | Disposition: A | Discharge status: Home / Self Care | Payer: No Typology Code available for payment source | Source: Ambulatory Visit | Attending: Family Medicine | Admitting: Family Medicine

## 2014-11-12 DIAGNOSIS — N289 Disorder of kidney and ureter, unspecified: Secondary | ICD-10-CM

## 2014-11-12 MED ORDER — GADOBENATE DIMEGLUMINE 529 MG/ML IV SOLN
12.0000 mL | Freq: Once | INTRAVENOUS | Status: AC | PRN
  Administered 2014-11-12: 12 mL via INTRAVENOUS

## 2014-11-12 NOTE — Telephone Encounter (Signed)
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  Opal Sidles from [facility] radiology was the caller

## 2014-11-13 ENCOUNTER — Other Ambulatory Visit: Payer: Self-pay | Admitting: Family Medicine

## 2014-11-13 DIAGNOSIS — C649 Malignant neoplasm of unspecified kidney, except renal pelvis: Secondary | ICD-10-CM

## 2014-11-23 ENCOUNTER — Other Ambulatory Visit: Payer: Self-pay | Admitting: Family Medicine

## 2014-11-26 ENCOUNTER — Other Ambulatory Visit (INDEPENDENT_AMBULATORY_CARE_PROVIDER_SITE_OTHER): Payer: No Typology Code available for payment source

## 2014-11-26 DIAGNOSIS — Z0000 Encounter for general adult medical examination without abnormal findings: Secondary | ICD-10-CM

## 2014-11-26 LAB — CBC WITH DIFFERENTIAL/PLATELET
Basophils Absolute: 0 10*3/uL (ref 0.0–0.1)
Basophils Relative: 0.6 % (ref 0.0–3.0)
EOS ABS: 0.3 10*3/uL (ref 0.0–0.7)
Eosinophils Relative: 5.1 % — ABNORMAL HIGH (ref 0.0–5.0)
HEMATOCRIT: 39.2 % (ref 36.0–46.0)
Hemoglobin: 13.3 g/dL (ref 12.0–15.0)
LYMPHS PCT: 34.3 % (ref 12.0–46.0)
Lymphs Abs: 1.9 10*3/uL (ref 0.7–4.0)
MCHC: 33.8 g/dL (ref 30.0–36.0)
MCV: 90.2 fl (ref 78.0–100.0)
Monocytes Absolute: 0.5 10*3/uL (ref 0.1–1.0)
Monocytes Relative: 9.5 % (ref 3.0–12.0)
NEUTROS ABS: 2.9 10*3/uL (ref 1.4–7.7)
Neutrophils Relative %: 50.5 % (ref 43.0–77.0)
Platelets: 246 10*3/uL (ref 150.0–400.0)
RBC: 4.35 Mil/uL (ref 3.87–5.11)
RDW: 13.4 % (ref 11.5–15.5)
WBC: 5.7 10*3/uL (ref 4.0–10.5)

## 2014-11-26 LAB — LIPID PANEL
CHOL/HDL RATIO: 3
Cholesterol: 244 mg/dL — ABNORMAL HIGH (ref 0–200)
HDL: 86.1 mg/dL (ref >39.00)
LDL Cholesterol: 146 mg/dL — ABNORMAL HIGH (ref 0–99)
NonHDL: 158.04
Triglycerides: 58 mg/dL (ref 0.0–149.0)
VLDL: 11.6 mg/dL (ref 0.0–40.0)

## 2014-11-26 LAB — POCT URINALYSIS DIPSTICK
BILIRUBIN UA: NEGATIVE
Glucose, UA: NEGATIVE
Ketones, UA: NEGATIVE
Nitrite, UA: NEGATIVE
PH UA: 5.5
Protein, UA: NEGATIVE
Spec Grav, UA: 1.025
Urobilinogen, UA: 0.2

## 2014-11-26 LAB — BASIC METABOLIC PANEL
BUN: 19 mg/dL (ref 6–23)
CO2: 31 meq/L (ref 19–32)
Calcium: 9.4 mg/dL (ref 8.4–10.5)
Chloride: 106 mEq/L (ref 96–112)
Creatinine, Ser: 0.76 mg/dL (ref 0.40–1.20)
GFR: 82.96 mL/min (ref >60.00)
GLUCOSE: 93 mg/dL (ref 70–99)
POTASSIUM: 4.2 meq/L (ref 3.5–5.1)
Sodium: 142 mEq/L (ref 135–145)

## 2014-11-26 LAB — HEPATIC FUNCTION PANEL
ALBUMIN: 3.9 g/dL (ref 3.5–5.2)
ALK PHOS: 64 U/L (ref 39–117)
ALT: 12 U/L (ref 0–35)
AST: 11 U/L (ref 0–37)
BILIRUBIN DIRECT: 0 mg/dL (ref 0.0–0.3)
BILIRUBIN TOTAL: 0.4 mg/dL (ref 0.2–1.2)
Total Protein: 6.5 g/dL (ref 6.0–8.3)

## 2014-11-26 LAB — TSH: TSH: 1.72 u[IU]/mL (ref 0.35–4.50)

## 2014-11-26 NOTE — Addendum Note (Signed)
Addended by: Elmer Picker on: 11/26/2014 08:58 AM   Modules accepted: Orders

## 2014-11-29 ENCOUNTER — Telehealth: Payer: Self-pay | Admitting: Family Medicine

## 2014-11-29 DIAGNOSIS — Z780 Asymptomatic menopausal state: Secondary | ICD-10-CM

## 2014-11-29 NOTE — Telephone Encounter (Signed)
Referral placed.

## 2014-11-29 NOTE — Telephone Encounter (Signed)
Pt states that Dr Deatra Ina wants her to have a dexa. Would you place an order for her please?

## 2014-12-03 ENCOUNTER — Ambulatory Visit (INDEPENDENT_AMBULATORY_CARE_PROVIDER_SITE_OTHER): Payer: No Typology Code available for payment source | Admitting: Family Medicine

## 2014-12-03 ENCOUNTER — Encounter: Payer: Self-pay | Admitting: Family Medicine

## 2014-12-03 VITALS — BP 120/80 | Temp 98.1°F | Ht 66.25 in | Wt 133.0 lb

## 2014-12-03 DIAGNOSIS — Z0000 Encounter for general adult medical examination without abnormal findings: Secondary | ICD-10-CM | POA: Diagnosis not present

## 2014-12-03 DIAGNOSIS — F411 Generalized anxiety disorder: Secondary | ICD-10-CM | POA: Diagnosis not present

## 2014-12-03 DIAGNOSIS — I471 Supraventricular tachycardia: Secondary | ICD-10-CM | POA: Diagnosis not present

## 2014-12-03 DIAGNOSIS — N289 Disorder of kidney and ureter, unspecified: Secondary | ICD-10-CM | POA: Diagnosis not present

## 2014-12-03 MED ORDER — BUPROPION HCL ER (XL) 300 MG PO TB24: ORAL_TABLET | ORAL | Status: DC

## 2014-12-03 MED ORDER — CLONAZEPAM 1 MG PO TABS: 1.0000 mg | ORAL_TABLET | Freq: Every day | ORAL | Status: DC

## 2014-12-03 MED ORDER — METOPROLOL SUCCINATE ER 25 MG PO TB24: 12.5000 mg | ORAL_TABLET | Freq: Two times a day (BID) | ORAL | Status: DC

## 2014-12-03 NOTE — Progress Notes (Signed)
   Subjective:    Patient ID: Shelby Grant, female    DOB: 06/14/1955, 59 y.o.   MRN: [id number]  HPI Shelby Grant is a 59 year old married female nonsmoker who comes in today for general physical examination  She takes Wellbutrin 300 mg daily along with Klonopin 1 mg at bedtime for anxiety and mild depression  She takes Toprol 25 mg dose one half tab twice a day because a history of SVT  This past summer she had appendectomy CT scan showed a renal lesion in the left upper kidney. She came in here for follow-up after the appendectomy. I reviewed the CT scan and we ordered an MRI. MRI shows a possible renal malignancy. She's due to see Dr. Alinda Grant a couple weeks to set up surgery.  She tells me that her mother died of renal cell carcinoma. She has 3 sisters. I recommended 3 sisters get screened  She gets routine eye care, dental care, BSE monthly, annual mammography, colonoscopy 2009 normal  Vaccinations up-to-date     Review of Systems  Constitutional: Negative.   HENT: Negative.   Eyes: Negative.   Respiratory: Negative.   Cardiovascular: Negative.   Gastrointestinal: Negative.   Endocrine: Negative.   Genitourinary: Negative.   Musculoskeletal: Negative.   Skin: Negative.   Allergic/Immunologic: Negative.   Neurological: Negative.   Hematological: Negative.   Psychiatric/Behavioral: Negative.        Objective:   Physical Exam  Constitutional: She appears well-developed and well-nourished.  HENT:  Head: Normocephalic and atraumatic.  Right Ear: External ear normal.  Left Ear: External ear normal.  Nose: Nose normal.  Mouth/Throat: Oropharynx is clear and moist.  Eyes: EOM are normal. Pupils are equal, round, and reactive to light.  Neck: Normal range of motion. Neck supple. No JVD present. No tracheal deviation present. No thyromegaly present.  Cardiovascular: Normal rate, regular rhythm, normal heart sounds and intact distal pulses.  Exam reveals no gallop and no  friction rub.   No murmur heard. Pulmonary/Chest: Effort normal and breath sounds normal. No stridor. No respiratory distress. She has no wheezes. She has no rales. She exhibits no tenderness.  Abdominal: Soft. Bowel sounds are normal. She exhibits no distension and no mass. There is no tenderness. There is no rebound and no guarding.  Genitourinary:  Bilateral breast exam normal. Pelvic and Pap last year normal therefore recommend every 3 years since she's never had any problems with her Paps and is on monogamous long-term marriage  Musculoskeletal: Normal range of motion.  Lymphadenopathy:    She has no cervical adenopathy.  Neurological: She is alert. She has normal reflexes. No cranial nerve deficit. She exhibits normal muscle tone. Coordination normal.  Skin: Skin is warm and dry. No rash noted. No erythema. No pallor.  Psychiatric: She has a normal mood and affect. Her behavior is normal. Judgment and thought content normal.  Nursing note and vitals reviewed.         Assessment & Plan:  Healthy female  Lesion upper pole left kidney......Marland Kitchen referred to Dr. Alinda Grant for surgical evaluation  History of anxiety and depression........ continue Wellbutrin and Klonopin  History of SVT...... Toprol 25 mg dose one half tab twice a day.

## 2014-12-03 NOTE — Patient Instructions (Signed)
Continue current medications  Follow-up in one year sooner if any problems,,,,,,,,,,,,, Georgina Snell and  Almyra Free,,,,,,,,,,,,, are 2 new adult nurse practitioner's

## 2014-12-05 ENCOUNTER — Ambulatory Visit (INDEPENDENT_AMBULATORY_CARE_PROVIDER_SITE_OTHER)
Admission: RE | Admit: 2014-12-05 | Discharge: 2014-12-05 | Disposition: A | Discharge status: Home / Self Care | Payer: No Typology Code available for payment source | Source: Ambulatory Visit | Attending: Family Medicine | Admitting: Family Medicine

## 2014-12-05 DIAGNOSIS — Z780 Asymptomatic menopausal state: Secondary | ICD-10-CM

## 2014-12-17 ENCOUNTER — Other Ambulatory Visit: Payer: Self-pay | Admitting: Urology

## 2014-12-18 ENCOUNTER — Other Ambulatory Visit: Payer: Self-pay | Admitting: Urology

## 2014-12-19 ENCOUNTER — Telehealth: Payer: Self-pay | Admitting: Family Medicine

## 2014-12-19 NOTE — Telephone Encounter (Signed)
Pt states she has a stress fracture and is curious to now the results of her bone density asap.  Pt also mentioned she has been recently diagnosed with cancer of the kidney and will be having surgery on 01/06/16.  She appreciates Dr. Sherren Mocha for referring her to Dr. Alinda Money.

## 2014-12-23 NOTE — Telephone Encounter (Signed)
Left message on machine for patient to return our call.  Patient should start taking Calcium and Vitamin D - OTC, and walk 30 minutes a day- per Dr Sherren Mocha.

## 2014-12-24 NOTE — Telephone Encounter (Signed)
Patient is aware 

## 2015-01-01 NOTE — Progress Notes (Signed)
08/27/2014-noted ion EPIC- EKG and CXR.

## 2015-01-02 ENCOUNTER — Other Ambulatory Visit (HOSPITAL_COMMUNITY): Payer: Self-pay | Admitting: *Deleted

## 2015-01-02 ENCOUNTER — Encounter (HOSPITAL_COMMUNITY): Payer: Self-pay

## 2015-01-02 ENCOUNTER — Encounter (HOSPITAL_COMMUNITY)
Admission: RE | Admit: 2015-01-02 | Discharge: 2015-01-02 | Disposition: A | Discharge status: Home / Self Care | Payer: No Typology Code available for payment source | Source: Ambulatory Visit | Attending: Urology | Admitting: Urology

## 2015-01-02 DIAGNOSIS — Z01818 Encounter for other preprocedural examination: Secondary | ICD-10-CM | POA: Insufficient documentation

## 2015-01-02 DIAGNOSIS — D49519 Neoplasm of unspecified behavior of unspecified kidney: Secondary | ICD-10-CM | POA: Diagnosis not present

## 2015-01-02 HISTORY — DX: Stress fracture, unspecified ankle, initial encounter for fracture: M84.373A

## 2015-01-02 HISTORY — DX: Neoplasm of unspecified behavior of unspecified site: D49.9

## 2015-01-02 HISTORY — DX: Adverse effect of unspecified anesthetic, initial encounter: T41.45XA

## 2015-01-02 LAB — BASIC METABOLIC PANEL
Anion gap: 4 — ABNORMAL LOW (ref 5–15)
BUN: 21 mg/dL — AB (ref 6–20)
CHLORIDE: 102 mmol/L (ref 101–111)
CO2: 33 mmol/L — AB (ref 22–32)
CREATININE: 0.87 mg/dL (ref 0.44–1.00)
Calcium: 9.7 mg/dL (ref 8.9–10.3)
GFR calc Af Amer: >60 mL/min (ref >60)
GFR calc non Af Amer: >60 mL/min (ref >60)
GLUCOSE: 81 mg/dL (ref 65–99)
Potassium: 3.9 mmol/L (ref 3.5–5.1)
SODIUM: 139 mmol/L (ref 135–145)

## 2015-01-02 LAB — CBC
HCT: 39.3 % (ref 36.0–46.0)
Hemoglobin: 13.3 g/dL (ref 12.0–15.0)
MCH: 30.1 pg (ref 26.0–34.0)
MCHC: 33.8 g/dL (ref 30.0–36.0)
MCV: 88.9 fL (ref 78.0–100.0)
PLATELETS: 250 10*3/uL (ref 150–400)
RBC: 4.42 MIL/uL (ref 3.87–5.11)
RDW: 13 % (ref 11.5–15.5)
WBC: 8.8 10*3/uL (ref 4.0–10.5)

## 2015-01-02 LAB — NO BLOOD PRODUCTS

## 2015-01-02 NOTE — Progress Notes (Signed)
Blood refusal faxed to dr Alinda Money and [facility], fax confirmation received and placed on patient chart

## 2015-01-02 NOTE — Patient Instructions (Addendum)
Mauren Melby Badman  01/02/2015   Your procedure is scheduled on: 01-06-15  Report to [place]  Entrance take [facility]  elevators to 3rd floor to  [place] at 515 AM.  Call this number if you have problems the morning of surgery [phone]   Remember: ONLY 1 PERSON MAY GO WITH YOU TO SHORT STAY TO GET  READY MORNING OF [place].  Do not eat food  liquids :After Midnight Saturday night 01-04-15, clear liquids all day Sunday 01-05-15, follow all bowel prep instructions from dr borden, no clear liquids after midnight Sunday night.      Take these medicines the morning of surgery with A SIP OF WATER: Wellbutrin XL                               You may not have any metal on your body including hair pins and              piercings  Do not wear jewelry, make-up, lotions, powders or perfumes, deodorant             Do not wear nail polish.  Do not shave  48 hours prior to surgery.              Men may shave face and neck.   Do not bring valuables to the hospital. [place].  Contacts, dentures or bridgework may not be worn into surgery.  Leave suitcase in the car. After surgery it may be brought to your room.                  Please read over the following fact sheets you were given: _____________________________________________________________________             [facility] - Preparing for Surgery Before surgery, you can play an important role.  Because skin is not sterile, your skin needs to be as free of germs as possible.  You can reduce the number of germs on your skin by washing with CHG (chlorahexidine gluconate) soap before surgery.  CHG is an antiseptic cleaner which kills germs and bonds with the skin to continue killing germs even after washing. Please DO NOT use if you have an allergy to CHG or antibacterial soaps.  If your skin becomes reddened/irritated stop using the CHG and inform your nurse  when you arrive at Short Stay. Do not shave (including legs and underarms) for at least 48 hours prior to the first CHG shower.  You may shave your face/neck. Please follow these instructions carefully:  1.  Shower with CHG Soap the night before surgery and the  morning of Surgery.  2.  If you choose to wash your hair, wash your hair first as usual with your  normal  shampoo.  3.  After you shampoo, rinse your hair and body thoroughly to remove the  shampoo.                           4.  Use CHG as you would any other liquid soap.  You can apply chg directly  to the skin and wash  Gently with a scrungie or clean washcloth.  5.  Apply the CHG Soap to your body ONLY FROM THE NECK DOWN.   Do not use on face/ open                           Wound or open sores. Avoid contact with eyes, ears mouth and genitals (private parts).                       Wash face,  Genitals (private parts) with your normal soap.             6.  Wash thoroughly, paying special attention to the area where your surgery  will be performed.  7.  Thoroughly rinse your body with warm water from the neck down.  8.  DO NOT shower/wash with your normal soap after using and rinsing off  the CHG Soap.                9.  Pat yourself dry with a clean towel.            10.  Wear clean pajamas.            11.  Place clean sheets on your bed the night of your first shower and do not  sleep with pets. Day of Surgery : Do not apply any lotions/deodorants the morning of surgery.  Please wear clean clothes to the hospital/surgery center.  FAILURE TO FOLLOW THESE INSTRUCTIONS MAY RESULT IN THE CANCELLATION OF YOUR SURGERY PATIENT SIGNATURE_________________________________  NURSE SIGNATURE__________________________________  ________________________________________________________________________   Adam Phenix  An incentive spirometer is a tool that can help keep your lungs clear and active. This tool  measures how well you are filling your lungs with each breath. Taking long deep breaths may help reverse or decrease the chance of developing breathing (pulmonary) problems (especially infection) following:  A long period of time when you are unable to move or be active. BEFORE THE PROCEDURE   If the spirometer includes an indicator to show your best effort, your nurse or respiratory therapist will set it to a desired goal.  If possible, sit up straight or lean slightly forward. Try not to slouch.  Hold the incentive spirometer in an upright position. INSTRUCTIONS FOR USE  1. Sit on the edge of your bed if possible, or sit up as far as you can in bed or on a chair. 2. Hold the incentive spirometer in an upright position. 3. Breathe out normally. 4. Place the mouthpiece in your mouth and seal your lips tightly around it. 5. Breathe in slowly and as deeply as possible, raising the piston or the ball toward the top of the column. 6. Hold your breath for 3-5 seconds or for as long as possible. Allow the piston or ball to fall to the bottom of the column. 7. Remove the mouthpiece from your mouth and breathe out normally. 8. Rest for a few seconds and repeat Steps 1 through 7 at least 10 times every 1-2 hours when you are awake. Take your time and take a few normal breaths between deep breaths. 9. The spirometer may include an indicator to show your best effort. Use the indicator as a goal to work toward during each repetition. 10. After each set of 10 deep breaths, practice coughing to be sure your lungs are clear. If you have an incision (the cut made at the time of surgery),  support your incision when coughing by placing a pillow or rolled up towels firmly against it. Once you are able to get out of bed, walk around indoors and cough well. You may stop using the incentive spirometer when instructed by your caregiver.  RISKS AND COMPLICATIONS  Take your time so you do not get dizzy or  light-headed.  If you are in pain, you may need to take or ask for pain medication before doing incentive spirometry. It is harder to take a deep breath if you are having pain. AFTER USE  Rest and breathe slowly and easily.  It can be helpful to keep track of a log of your progress. Your caregiver can provide you with a simple table to help with this. If you are using the spirometer at home, follow these instructions: [place] IF:   You are having difficultly using the spirometer.  You have trouble using the spirometer as often as instructed.  Your pain medication is not giving enough relief while using the spirometer.  You develop fever of 100.5 F (38.1 C) or higher. SEEK IMMEDIATE MEDICAL CARE IF:   You cough up bloody sputum that had not been present before.  You develop fever of 102 F (38.9 C) or greater.  You develop worsening pain at or near the incision site. MAKE SURE YOU:   Understand these instructions.  Will watch your condition.  Will get help right away if you are not doing well or get worse. Document Released: 05/31/2006 Document Revised: 04/12/2011 Document Reviewed: 08/01/2006 [facility] Patient Information 2014 [place], [place].   ________________________________________________________________________

## 2015-01-03 NOTE — H&P (Signed)
Chief Complaint Left renal neoplasm   Reason For Visit Reason for consult: To discuss minimally invasive surgery for treatment of her renal neoplasm.  Physician requesting consult: Dr. Eda Keys  PCP: Dr. Christie Nottingham   History of Present Illness Ms. Lemmon is a 59 year old female [place] who developed acute appendicitis in July 2016 requiring appendectomy. During her evaluation, she underwent a CT scan and incidentally was found to have a hyperdense, posterior 1.1 cm left renal lesion. After her recovery, she underwent a follow up dedicated MRI of the kidneys with and without IV contrast to definitively evaluate her left renal mass. This confirmed a 1.4 cm posterior upper pole left renal lesion with low level enhancement most consistent with a renal cell carcinoma. This lesion is completely endophytic. There were no other concerning renal lesions, no regional lymphadenopathy, no adrenal involvement, no renal vein/IVC thrombus, and no other evidence of metastatic disease in the abdomen. Incidentally, there were noted to be hepatic cysts and an hepatic hemangioma.     Baseline renal function is normal (Cr 0.76). She has a family history of renal cell carcinoma. Her mother passed away from metastatic kidney cancer at age 30. There are no other first-degree relatives or other distant relatives that have had renal cell carcinoma.   Past Medical History Problems  1. History of Anxiety (F41.9) 2. History of depression (Z86.59) 3. History of paroxysmal supraventricular tachycardia (Z86.79)  Surgical History Problems  1. History of Appendectomy  Current Meds 1. ClonazePAM TABS;  Therapy: (Recorded:18Oct2016) to Recorded 2. Metoprolol Tartrate 25 MG Oral Tablet;  Therapy: (Recorded:18Oct2016) to Recorded 3. Wellbutrin XL 300 MG Oral Tablet Extended Release 24 Hour;  Therapy: (Recorded:18Oct2016) to Recorded  Allergies Medication  1. amoxicillin Non-Medication  2.  Latex  Family History Problems  1. Family history of malignant neoplasm of kidney (Z80.51) : Mother   age of death 86  Social History Problems    Alcohol use (Z78.9)   occasional drinker   Denied: History of Caffeine use   Has no children   Married   Never a smoker   Occupation   self employed  Review of Systems Genitourinary, constitutional, skin, eye, otolaryngeal, hematologic/lymphatic, cardiovascular, pulmonary, endocrine, musculoskeletal, gastrointestinal, neurological and psychiatric system(s) were reviewed and pertinent findings if present are noted and are otherwise negative.    Vitals Vital Signs [Data Includes: Last 1 Day]  Recorded: [id number] 08:06AM  Weight: 136 lb  BMI Calculated: 21.95 BSA Calculated: 1.7 Blood Pressure: 118 / 72 Heart Rate: 76  Physical Exam Constitutional: Well nourished and well developed . No acute distress.  ENT:. The ears and nose are normal in appearance.  Neck: The appearance of the neck is normal and no neck mass is present.  Pulmonary: No respiratory distress, normal respiratory rhythm and effort and clear bilateral breath sounds.  Cardiovascular: Heart rate and rhythm are normal . No peripheral edema.  Abdomen: Incision site(s) well healed. The abdomen is soft and nontender. No masses are palpated. No CVA tenderness. No hernias are palpable. No hepatosplenomegaly noted.  Lymphatics: The supraclavicular, femoral and inguinal nodes are not enlarged or tender.  Skin: Normal skin turgor, no visible rash and no visible skin lesions.  Neuro/Psych:. Mood and affect are appropriate.    Results/Data Selected Results  UA With REFLEX [id number] 07:58AM Raynelle Bring  SPECIMEN TYPE: CLEAN CATCH   Test Name Result Flag Reference  COLOR YELLOW  YELLOW  ** PLEASE NOTE CHANGE IN UNIT OF MEASURE AND REFERENCE RANGE(S). **  APPEARANCE CLEAR  CLEAR  SPECIFIC GRAVITY <1.005  1.001-1.035  pH 7.0  5.0-8.0  GLUCOSE NEGATIVE  NEGATIVE   BILIRUBIN NEGATIVE  NEGATIVE  KETONE NEGATIVE  NEGATIVE  BLOOD TRACE A NEGATIVE  PROTEIN NEGATIVE  NEGATIVE  NITRITE NEGATIVE  NEGATIVE  LEUKOCYTE ESTERASE TRACE A NEGATIVE  SQUAMOUS EPITHELIAL/HPF 0-5 HPF  <=5  WBC 10-20 WBC/HPF A <=5  RBC 0-2 RBC/HPF  <=2  BACTERIA NONE SEEN HPF  NONE SEEN  CRYSTALS NONE SEEN HPF  NONE SEEN  CASTS NONE SEEN LPF  NONE SEEN  Yeast NONE SEEN HPF  NONE SEEN    I have independently reviewed her CT scan and subsequent MRI. Findings are as dictated above. I reviewed these with her and her family today.  Assessment Assessed  1. Neoplasm of left kidney [id number])  Plan Neoplasm of left kidney  1. Follow-up Schedule Surgery Office  Follow-up  Status: Hold For - Appointment   Requested for: [id number]  Discussion/Summary 1. Endophytic left renal neoplasm: We reviewed her MRI together and discussed this finding in detail. She did undergo a chest x-ray couple months ago that was negative for metastatic disease. Her laboratory studies also been unremarkable including liver function tests within normal limits.   The patient was provided information regarding their renal mass including the relative risk of benign versus malignant pathology and the natural history of renal cell carcinoma and other possible malignancies of the kidney. The role of renal biopsy, laboratory testing, and imaging studies to further characterize renal masses and/or the presence of metastatic disease were explained. We discussed the role of active surveillance, surgical therapy with both radical nephrectomy and nephron-sparing surgery, and ablative therapy in the treatment of renal masses. In addition, we discussed our goals of providing an accurate diagnosis and oncologic control while maintaining optimal renal function as appropriate based on the size, location, and complexity of their renal mass as well as their co-morbidities.    We have discussed the risks of treatment in detail including  but not limited to bleeding, infection, heart attack, stroke, death, venothromoboembolism, cancer recurrence, injury/damage to surrounding organs and structures, urine leak, the possibility of open surgical conversion for patients undergoing minimally invasive surgery, the risk of developing chronic kidney disease and its associated implications, and the potential risk of end stage renal disease possibly necessitating dialysis.     After a review of her options, she does wish to proceed with surgical resection of her concerning renal mass. I recommended that she consider a left robot-assisted laparoscopic partial nephrectomy with intraoperative ultrasound considering the endophytic appearance of her renal mass.    We had a detailed discussion regarding her decision to refuse any blood transfusion products. She understands the overall risk of significant blood loss and need for transfusion would be low. She did ask about the option of utilizing a Cell Saver if necessary and we discussed that we can have that available that it would be very unlikely to be needed. She understands that we may have to use other measures if there is intraoperative bleeding such as to perform a full nephrectomy if necessary. She understands and accepts these risks and wishes to proceed as planned.    Cc: Dr. Christie Nottingham  Dr. Eda Keys    SignaturesElectronically signed by : Raynelle Bring, M.D.; Dec 17 2014  4:28PM EST

## 2015-01-06 ENCOUNTER — Inpatient Hospital Stay (HOSPITAL_COMMUNITY): Payer: No Typology Code available for payment source | Admitting: Anesthesiology

## 2015-01-06 ENCOUNTER — Encounter (HOSPITAL_COMMUNITY)
Admission: RE | Disposition: A | Discharge status: Home / Self Care | Payer: Self-pay | Source: Ambulatory Visit | Attending: Urology

## 2015-01-06 ENCOUNTER — Inpatient Hospital Stay (HOSPITAL_COMMUNITY)
Admission: RE | Admit: 2015-01-06 | Discharge: 2015-01-09 | DRG: 658 | Disposition: A | Discharge status: Home / Self Care | Payer: No Typology Code available for payment source | Source: Ambulatory Visit | Attending: Urology | Admitting: Urology

## 2015-01-06 ENCOUNTER — Encounter (HOSPITAL_COMMUNITY): Payer: Self-pay | Admitting: *Deleted

## 2015-01-06 DIAGNOSIS — R319 Hematuria, unspecified: Secondary | ICD-10-CM | POA: Diagnosis not present

## 2015-01-06 DIAGNOSIS — C642 Malignant neoplasm of left kidney, except renal pelvis: Principal | ICD-10-CM | POA: Diagnosis present

## 2015-01-06 DIAGNOSIS — F419 Anxiety disorder, unspecified: Secondary | ICD-10-CM | POA: Diagnosis present

## 2015-01-06 DIAGNOSIS — R0602 Shortness of breath: Secondary | ICD-10-CM | POA: Diagnosis not present

## 2015-01-06 DIAGNOSIS — N2889 Other specified disorders of kidney and ureter: Secondary | ICD-10-CM | POA: Diagnosis present

## 2015-01-06 DIAGNOSIS — Z79899 Other long term (current) drug therapy: Secondary | ICD-10-CM | POA: Diagnosis not present

## 2015-01-06 DIAGNOSIS — D49519 Neoplasm of unspecified behavior of unspecified kidney: Secondary | ICD-10-CM | POA: Diagnosis present

## 2015-01-06 DIAGNOSIS — R0789 Other chest pain: Secondary | ICD-10-CM | POA: Diagnosis not present

## 2015-01-06 DIAGNOSIS — Z01812 Encounter for preprocedural laboratory examination: Secondary | ICD-10-CM | POA: Diagnosis not present

## 2015-01-06 DIAGNOSIS — D1803 Hemangioma of intra-abdominal structures: Secondary | ICD-10-CM | POA: Diagnosis present

## 2015-01-06 DIAGNOSIS — Z9049 Acquired absence of other specified parts of digestive tract: Secondary | ICD-10-CM

## 2015-01-06 DIAGNOSIS — F329 Major depressive disorder, single episode, unspecified: Secondary | ICD-10-CM | POA: Diagnosis present

## 2015-01-06 DIAGNOSIS — Z8051 Family history of malignant neoplasm of kidney: Secondary | ICD-10-CM | POA: Diagnosis not present

## 2015-01-06 DIAGNOSIS — R079 Chest pain, unspecified: Secondary | ICD-10-CM

## 2015-01-06 DIAGNOSIS — K7689 Other specified diseases of liver: Secondary | ICD-10-CM | POA: Diagnosis present

## 2015-01-06 HISTORY — PX: ROBOTIC ASSITED PARTIAL NEPHRECTOMY: SHX6087

## 2015-01-06 LAB — BASIC METABOLIC PANEL
ANION GAP: 6 (ref 5–15)
BUN: 11 mg/dL (ref 6–20)
CALCIUM: 8.5 mg/dL — AB (ref 8.9–10.3)
CO2: 28 mmol/L (ref 22–32)
Chloride: 103 mmol/L (ref 101–111)
Creatinine, Ser: 0.87 mg/dL (ref 0.44–1.00)
GFR calc Af Amer: >60 mL/min (ref >60)
GFR calc non Af Amer: >60 mL/min (ref >60)
GLUCOSE: 151 mg/dL — AB (ref 65–99)
Potassium: 3.8 mmol/L (ref 3.5–5.1)
Sodium: 137 mmol/L (ref 135–145)

## 2015-01-06 LAB — HEMOGLOBIN AND HEMATOCRIT, BLOOD
HCT: 34.6 % — ABNORMAL LOW (ref 36.0–46.0)
Hemoglobin: 11.6 g/dL — ABNORMAL LOW (ref 12.0–15.0)

## 2015-01-06 SURGERY — ROBOTIC ASSITED PARTIAL NEPHRECTOMY
Anesthesia: General | Laterality: Left

## 2015-01-06 MED ORDER — DIPHENHYDRAMINE HCL 50 MG/ML IJ SOLN: 12.5000 mg | Freq: Four times a day (QID) | INTRAMUSCULAR | Status: DC | PRN

## 2015-01-06 MED ORDER — DEXTROSE-NACL 5-0.45 % IV SOLN
INTRAVENOUS | Status: DC
  Administered 2015-01-06: 20:00:00 via INTRAVENOUS
  Administered 2015-01-06: 150 mL/h via INTRAVENOUS
  Administered 2015-01-07: 03:00:00 via INTRAVENOUS

## 2015-01-06 MED ORDER — SODIUM CHLORIDE 0.9 % IJ SOLN
INTRAMUSCULAR | Status: DC | PRN
  Administered 2015-01-06: 20 mL

## 2015-01-06 MED ORDER — CLONAZEPAM 1 MG PO TABS
1.0000 mg | ORAL_TABLET | Freq: Every day | ORAL | Status: DC
  Administered 2015-01-06 – 2015-01-08 (×3): 1 mg via ORAL
  Filled 2015-01-06 (×3): qty 1

## 2015-01-06 MED ORDER — ONDANSETRON HCL 4 MG/2ML IJ SOLN
INTRAMUSCULAR | Status: DC | PRN
  Administered 2015-01-06: 4 mg via INTRAVENOUS

## 2015-01-06 MED ORDER — GLYCOPYRROLATE 0.2 MG/ML IJ SOLN
INTRAMUSCULAR | Status: AC
  Filled 2015-01-06: qty 2

## 2015-01-06 MED ORDER — ROCURONIUM BROMIDE 100 MG/10ML IV SOLN
INTRAVENOUS | Status: AC
  Filled 2015-01-06: qty 1

## 2015-01-06 MED ORDER — OXYCODONE HCL 5 MG/5ML PO SOLN
5.0000 mg | Freq: Once | ORAL | Status: DC | PRN
  Filled 2015-01-06: qty 5

## 2015-01-06 MED ORDER — STERILE WATER FOR IRRIGATION IR SOLN
Status: DC | PRN
  Administered 2015-01-06: 1000 mL

## 2015-01-06 MED ORDER — LACTATED RINGERS IR SOLN
Status: DC | PRN
  Administered 2015-01-06: 1000 mL

## 2015-01-06 MED ORDER — HYDROMORPHONE HCL 1 MG/ML IJ SOLN
INTRAMUSCULAR | Status: AC
Start: 2015-01-06 — End: 2015-01-06
  Filled 2015-01-06: qty 2

## 2015-01-06 MED ORDER — GLYCOPYRROLATE 0.2 MG/ML IJ SOLN
INTRAMUSCULAR | Status: DC | PRN
  Administered 2015-01-06: 0.4 mg via INTRAVENOUS

## 2015-01-06 MED ORDER — CEFAZOLIN SODIUM 1-5 GM-% IV SOLN
1.0000 g | Freq: Three times a day (TID) | INTRAVENOUS | Status: AC
  Administered 2015-01-06 (×2): 1 g via INTRAVENOUS
  Filled 2015-01-06 (×2): qty 50

## 2015-01-06 MED ORDER — ONDANSETRON HCL 4 MG/2ML IJ SOLN
INTRAMUSCULAR | Status: AC
  Filled 2015-01-06: qty 2

## 2015-01-06 MED ORDER — NEOSTIGMINE METHYLSULFATE 10 MG/10ML IV SOLN
INTRAVENOUS | Status: DC | PRN
  Administered 2015-01-06: 3 mg via INTRAVENOUS

## 2015-01-06 MED ORDER — PROPOFOL 10 MG/ML IV BOLUS
INTRAVENOUS | Status: AC
  Filled 2015-01-06: qty 20

## 2015-01-06 MED ORDER — ROCURONIUM BROMIDE 100 MG/10ML IV SOLN
INTRAVENOUS | Status: DC | PRN
  Administered 2015-01-06: 20 mg via INTRAVENOUS
  Administered 2015-01-06: 40 mg via INTRAVENOUS
  Administered 2015-01-06 (×2): 20 mg via INTRAVENOUS

## 2015-01-06 MED ORDER — BUPIVACAINE LIPOSOME 1.3 % IJ SUSP
20.0000 mL | Freq: Once | INTRAMUSCULAR | Status: AC
  Administered 2015-01-06: 20 mL
  Filled 2015-01-06: qty 20

## 2015-01-06 MED ORDER — MORPHINE SULFATE (PF) 2 MG/ML IV SOLN
2.0000 mg | INTRAVENOUS | Status: DC | PRN
  Administered 2015-01-06 (×2): 2 mg via INTRAVENOUS
  Administered 2015-01-07 (×2): 4 mg via INTRAVENOUS
  Filled 2015-01-06: qty 1
  Filled 2015-01-06: qty 2
  Filled 2015-01-06: qty 1
  Filled 2015-01-06 (×2): qty 2

## 2015-01-06 MED ORDER — MIDAZOLAM HCL 5 MG/5ML IJ SOLN
INTRAMUSCULAR | Status: DC | PRN
  Administered 2015-01-06: 2 mg via INTRAVENOUS

## 2015-01-06 MED ORDER — ACETAMINOPHEN 160 MG/5ML PO SOLN
325.0000 mg | ORAL | Status: DC | PRN
Start: 2015-01-06 — End: 2015-01-06

## 2015-01-06 MED ORDER — DIPHENHYDRAMINE HCL 12.5 MG/5ML PO ELIX: 12.5000 mg | ORAL_SOLUTION | Freq: Four times a day (QID) | ORAL | Status: DC | PRN

## 2015-01-06 MED ORDER — LIDOCAINE HCL (CARDIAC) 20 MG/ML IV SOLN
INTRAVENOUS | Status: DC | PRN
  Administered 2015-01-06: 100 mg via INTRAVENOUS

## 2015-01-06 MED ORDER — FENTANYL CITRATE (PF) 100 MCG/2ML IJ SOLN
INTRAMUSCULAR | Status: DC | PRN
  Administered 2015-01-06 (×3): 50 ug via INTRAVENOUS

## 2015-01-06 MED ORDER — CEFAZOLIN SODIUM-DEXTROSE 2-3 GM-% IV SOLR
2.0000 g | INTRAVENOUS | Status: AC
  Administered 2015-01-06: 2 g via INTRAVENOUS

## 2015-01-06 MED ORDER — METOPROLOL SUCCINATE ER 25 MG PO TB24
12.5000 mg | ORAL_TABLET | Freq: Every day | ORAL | Status: DC
  Administered 2015-01-06 – 2015-01-08 (×3): 12.5 mg via ORAL
  Filled 2015-01-06 (×3): qty 1

## 2015-01-06 MED ORDER — ONDANSETRON HCL 4 MG/2ML IJ SOLN
4.0000 mg | INTRAMUSCULAR | Status: DC | PRN
  Administered 2015-01-06: 4 mg via INTRAVENOUS
  Filled 2015-01-06: qty 2

## 2015-01-06 MED ORDER — FENTANYL CITRATE (PF) 250 MCG/5ML IJ SOLN
INTRAMUSCULAR | Status: AC
  Filled 2015-01-06: qty 5

## 2015-01-06 MED ORDER — DEXAMETHASONE SODIUM PHOSPHATE 10 MG/ML IJ SOLN
INTRAMUSCULAR | Status: DC | PRN
  Administered 2015-01-06: 10 mg via INTRAVENOUS

## 2015-01-06 MED ORDER — MANNITOL 25 % IV SOLN
INTRAVENOUS | Status: DC | PRN
  Administered 2015-01-06 (×2): 12.5 g via INTRAVENOUS

## 2015-01-06 MED ORDER — LIDOCAINE HCL (CARDIAC) 20 MG/ML IV SOLN
INTRAVENOUS | Status: AC
  Filled 2015-01-06: qty 5

## 2015-01-06 MED ORDER — HEPARIN SODIUM (PORCINE) 1000 UNIT/ML IJ SOLN
INTRAMUSCULAR | Status: AC
  Filled 2015-01-06: qty 1

## 2015-01-06 MED ORDER — ACETAMINOPHEN 10 MG/ML IV SOLN
1000.0000 mg | Freq: Four times a day (QID) | INTRAVENOUS | Status: AC
  Administered 2015-01-06 – 2015-01-07 (×4): 1000 mg via INTRAVENOUS
  Filled 2015-01-06 (×7): qty 100

## 2015-01-06 MED ORDER — CEFAZOLIN SODIUM 1-5 GM-% IV SOLN
INTRAVENOUS | Status: AC
  Filled 2015-01-06: qty 50

## 2015-01-06 MED ORDER — MANNITOL 25 % IV SOLN
INTRAVENOUS | Status: AC
  Filled 2015-01-06: qty 50

## 2015-01-06 MED ORDER — BUPROPION HCL ER (XL) 300 MG PO TB24
300.0000 mg | ORAL_TABLET | Freq: Every day | ORAL | Status: DC
  Administered 2015-01-07 – 2015-01-09 (×3): 300 mg via ORAL
  Filled 2015-01-06 (×3): qty 1

## 2015-01-06 MED ORDER — ACETAMINOPHEN 325 MG PO TABS: 325.0000 mg | ORAL_TABLET | ORAL | Status: DC | PRN

## 2015-01-06 MED ORDER — HYDROMORPHONE HCL 1 MG/ML IJ SOLN
0.2500 mg | INTRAMUSCULAR | Status: DC | PRN
  Administered 2015-01-06 (×4): 0.5 mg via INTRAVENOUS

## 2015-01-06 MED ORDER — SODIUM CHLORIDE 0.9 % IJ SOLN
INTRAMUSCULAR | Status: AC
  Filled 2015-01-06: qty 20

## 2015-01-06 MED ORDER — HYDROCODONE-ACETAMINOPHEN 5-325 MG PO TABS: 1.0000 | ORAL_TABLET | Freq: Four times a day (QID) | ORAL | Status: DC | PRN

## 2015-01-06 MED ORDER — DOCUSATE SODIUM 100 MG PO CAPS
100.0000 mg | ORAL_CAPSULE | Freq: Two times a day (BID) | ORAL | Status: DC
Start: 2015-01-06 — End: 2015-01-09
  Administered 2015-01-06 – 2015-01-09 (×6): 100 mg via ORAL
  Filled 2015-01-06 (×6): qty 1

## 2015-01-06 MED ORDER — DEXAMETHASONE SODIUM PHOSPHATE 10 MG/ML IJ SOLN
INTRAMUSCULAR | Status: AC
  Filled 2015-01-06: qty 1

## 2015-01-06 MED ORDER — SUCCINYLCHOLINE CHLORIDE 20 MG/ML IJ SOLN
INTRAMUSCULAR | Status: DC | PRN
  Administered 2015-01-06: 100 mg via INTRAVENOUS

## 2015-01-06 MED ORDER — PROPOFOL 10 MG/ML IV BOLUS
INTRAVENOUS | Status: DC | PRN
  Administered 2015-01-06: 150 mg via INTRAVENOUS

## 2015-01-06 MED ORDER — OXYCODONE HCL 5 MG PO TABS: 5.0000 mg | ORAL_TABLET | Freq: Once | ORAL | Status: DC | PRN

## 2015-01-06 MED ORDER — LACTATED RINGERS IV SOLN
INTRAVENOUS | Status: DC | PRN
  Administered 2015-01-06 (×3): via INTRAVENOUS

## 2015-01-06 MED ORDER — MIDAZOLAM HCL 2 MG/2ML IJ SOLN
INTRAMUSCULAR | Status: AC
Start: 2015-01-06 — End: 2015-01-06
  Filled 2015-01-06: qty 2

## 2015-01-06 SURGICAL SUPPLY — 55 items
APL ESCP 34 STRL LF DISP (HEMOSTASIS) ×1
APPLICATOR SURGIFLO ENDO (HEMOSTASIS) ×1 IMPLANT
BAG SPEC RTRVL LRG 6X4 10 (ENDOMECHANICALS) ×1
CABLE HIGH FREQUENCY MONO STRZ (ELECTRODE) ×2 IMPLANT
CATH FOLEY LATEX FREE 16FR (CATHETERS) ×1 IMPLANT
CHLORAPREP W/TINT 26ML (MISCELLANEOUS) ×2 IMPLANT
CLIP LIGATING HEM O LOK PURPLE (MISCELLANEOUS) ×2 IMPLANT
CLIP LIGATING HEMO O LOK GREEN (MISCELLANEOUS) ×4 IMPLANT
CORDS BIPOLAR (ELECTRODE) ×2 IMPLANT
COVER BACK TABLE 60X90IN (DRAPES) ×1 IMPLANT
COVER SURGICAL LIGHT HANDLE (MISCELLANEOUS) ×2 IMPLANT
COVER TIP SHEARS 8 DVNC (MISCELLANEOUS) ×1 IMPLANT
COVER TIP SHEARS 8MM DA VINCI (MISCELLANEOUS) ×1
DECANTER SPIKE VIAL GLASS SM (MISCELLANEOUS) ×2 IMPLANT
DRAIN CHANNEL 15F RND FF 3/16 (WOUND CARE) ×2 IMPLANT
DRAPE INCISE IOBAN 66X45 STRL (DRAPES) ×2 IMPLANT
DRAPE LAPAROSCOPIC ABDOMINAL (DRAPES) IMPLANT
DRAPE SHEET LG 3/4 BI-LAMINATE (DRAPES) ×2 IMPLANT
DRAPE WARM FLUID 44X44 (DRAPE) ×2 IMPLANT
DRSG TEGADERM 4X4.75 (GAUZE/BANDAGES/DRESSINGS) ×1 IMPLANT
ELECT PENCIL ROCKER SW 15FT (MISCELLANEOUS) ×2 IMPLANT
ELECT REM PT RETURN 9FT ADLT (ELECTROSURGICAL) ×2
ELECTRODE REM PT RTRN 9FT ADLT (ELECTROSURGICAL) ×1 IMPLANT
EVACUATOR SILICONE 100CC (DRAIN) ×2 IMPLANT
GAUZE SPONGE 2X2 8PLY STRL LF (GAUZE/BANDAGES/DRESSINGS) IMPLANT
GLOVE BIO SURGEON STRL SZ 6.5 (GLOVE) ×1 IMPLANT
GLOVE BIOGEL M STRL SZ7.5 (GLOVE) ×2 IMPLANT
GOWN STRL REUS W/TWL LRG LVL3 (GOWN DISPOSABLE) ×8 IMPLANT
HEMOSTAT SURGICEL 4X8 (HEMOSTASIS) IMPLANT
KIT ACCESSORY DA VINCI DISP (KITS) ×1
KIT ACCESSORY DVNC DISP (KITS) ×1 IMPLANT
KIT BASIN OR (CUSTOM PROCEDURE TRAY) ×2 IMPLANT
LIQUID BAND (GAUZE/BANDAGES/DRESSINGS) ×3 IMPLANT
POSITIONER SURGICAL ARM (MISCELLANEOUS) ×4 IMPLANT
POUCH SPECIMEN RETRIEVAL 10MM (ENDOMECHANICALS) ×2 IMPLANT
SET TUBE IRRIG SUCTION NO TIP (IRRIGATION / IRRIGATOR) ×2 IMPLANT
SOLUTION ELECTROLUBE (MISCELLANEOUS) ×2 IMPLANT
SPONGE GAUZE 2X2 STER 10/PKG (GAUZE/BANDAGES/DRESSINGS) ×1
SURGIFLO W/THROMBIN 8M KIT (HEMOSTASIS) ×2 IMPLANT
SUT ETHILON 3 0 PS 1 (SUTURE) ×2 IMPLANT
SUT MNCRL AB 4-0 PS2 18 (SUTURE) ×4 IMPLANT
SUT V-LOC BARB 180 2/0GR6 GS22 (SUTURE) ×2
SUT VIC AB 0 CT1 27 (SUTURE) ×2
SUT VIC AB 0 CT1 27XBRD ANTBC (SUTURE) ×1 IMPLANT
SUT VICRYL 0 UR6 27IN ABS (SUTURE) ×4 IMPLANT
SUT VLOC BARB 180 ABS3/0GR12 (SUTURE) ×2
SUTURE V-LC BRB 180 2/0GR6GS22 (SUTURE) ×1 IMPLANT
SUTURE VLOC BRB 180 ABS3/0GR12 (SUTURE) ×1 IMPLANT
TOWEL OR 17X26 10 PK STRL BLUE (TOWEL DISPOSABLE) ×4 IMPLANT
TRAY FOLEY W/METER SILVER 16FR (SET/KITS/TRAYS/PACK) ×1 IMPLANT
TRAY LAPAROSCOPIC (CUSTOM PROCEDURE TRAY) ×2 IMPLANT
TROCAR BLADELESS OPT 5 100 (ENDOMECHANICALS) ×1 IMPLANT
TROCAR UNIVERSAL OPT 12M 100M (ENDOMECHANICALS) ×2 IMPLANT
TROCAR XCEL 12X100 BLDLESS (ENDOMECHANICALS) ×2 IMPLANT
WATER STERILE IRR 1500ML POUR (IV SOLUTION) ×2 IMPLANT

## 2015-01-06 NOTE — Anesthesia Preprocedure Evaluation (Signed)
Anesthesia Evaluation  Patient identified by MRN, date of birth, ID band Patient awake    Reviewed: Allergy & Precautions, NPO status , Patient's Chart, lab work & pertinent test results, reviewed documented beta blocker date and time   History of Anesthesia Complications (+) history of anesthetic complications  Airway Mallampati: II  TM Distance: >3 FB Neck ROM: Full  Mouth opening: Limited Mouth Opening  Dental  (+) Teeth Intact   Pulmonary neg pulmonary ROS,    breath sounds clear to auscultation       Cardiovascular (-) hypertension(-) angina(-) Past MI and (-) CHF + dysrhythmias Supra Ventricular Tachycardia  Rhythm:Regular     Neuro/Psych PSYCHIATRIC DISORDERS Anxiety  Neuromuscular disease    GI/Hepatic negative GI ROS,   Endo/Other  negative endocrine ROS  Renal/GU Renal diseaseLeft renal mass     Musculoskeletal  (+) Arthritis ,   Abdominal   Peds  Hematology  (+) JEHOVAH'S WITNESSCell saver to be used, will only accept albumin, no blood products   Anesthesia Other Findings   Reproductive/Obstetrics                             Anesthesia Physical Anesthesia Plan  ASA: II  Anesthesia Plan: General   Post-op Pain Management:    Induction: Intravenous  Airway Management Planned: Video Laryngoscope Planned  Additional Equipment: None  Intra-op Plan:   Post-operative Plan: Extubation in OR  Informed Consent: I have reviewed the patients History and Physical, chart, labs and discussed the procedure including the risks, benefits and alternatives for the proposed anesthesia with the patient or authorized representative who has indicated his/her understanding and acceptance.   Dental advisory given  Plan Discussed with: CRNA and Surgeon  Anesthesia Plan Comments: (Jaw dislocation with last surgery, will use video laryngoscopy to limit jaw manipulation during intubation,  discussed blood products and refusal of transfusion )        Anesthesia Quick Evaluation

## 2015-01-06 NOTE — Transfer of Care (Signed)
Immediate Anesthesia Transfer of Care Note  Patient: Shelby Grant  Procedure(s) Performed: Procedure(s): ROBOTIC ASSISTED PARTIAL NEPHRECTOMY (Left)  Patient Location: PACU  Anesthesia Type:General  Level of Consciousness: sedated  Airway & Oxygen Therapy: Patient Spontanous Breathing and Patient connected to face mask oxygen  Post-op Assessment: Report given to RN and Post -op Vital signs reviewed and stable  Post vital signs: Reviewed and stable  Last Vitals:  Filed Vitals:   01/06/15 0521  BP: 107/59  Pulse: 67  Temp: 37.2 C  Resp: 18    Complications: No apparent anesthesia complications

## 2015-01-06 NOTE — Anesthesia Postprocedure Evaluation (Signed)
Anesthesia Post Note  Patient: Shelby Grant  Procedure(s) Performed: Procedure(s) (LRB): ROBOTIC ASSISTED PARTIAL NEPHRECTOMY (Left)  Patient location during evaluation: PACU Anesthesia Type: General Level of consciousness: awake Pain management: pain level controlled Vital Signs Assessment: post-procedure vital signs reviewed and stable Respiratory status: spontaneous breathing Cardiovascular status: stable Postop Assessment: no signs of nausea or vomiting Anesthetic complications: no    Last Vitals:  Filed Vitals:   01/06/15 1115 01/06/15 1130  BP: 131/75 126/73  Pulse: 62 64  Temp:  36.1 C  Resp: 11 15    Last Pain:  Filed Vitals:   01/06/15 1135  PainSc: 8                  Nnaemeka Samson

## 2015-01-06 NOTE — Interval H&P Note (Signed)
History and Physical Interval Note:  01/06/2015 7:13 AM  Shelby Grant  has presented today for surgery, with the diagnosis of LEFT RENAL NEOPLASM  The various methods of treatment have been discussed with the patient and family. After consideration of risks, benefits and other options for treatment, the patient has consented to  Procedure(s): ROBOTIC ASSISTED PARTIAL NEPHRECTOMY (Left) as a surgical intervention .  The patient's history has been reviewed, patient examined, no change in status, stable for surgery.  I have reviewed the patient's chart and labs.  Questions were answered to the patient's satisfaction.     Iveliz Garay,LES

## 2015-01-06 NOTE — Op Note (Signed)
Preoperative diagnosis: Left renal mass  Postoperative diagnosis: Left renal mass  Procedure:  1. Left robotic-assisted laparoscopic partial nephrectomy 2. Intraoperative renal ultrasonography  Surgeon: Pryor Curia. M.D.  Assistant(s): Debbrah Alar, PA-C  Resident: Dr. Gracy Racer  Anesthesia: General  Complications: None  EBL: 100 mL  IVF:  1400 mL crystalloid  Specimens: 1. Left renal mass  Disposition of specimens: Pathology  Intraoperative findings:       1. Warm renal ischemia time: 14 minutes       2. Intraoperative renal ultrasound findings: A 12.6 x 10.9 mm hyperechoic left renal mass was identified off the upper pole of the posterior left kidney.  Drains: 1. # 15 Blake perinephric drain  Indication:  Shelby Grant is a 59 y.o. year old patient with a left renal mass.  After a thorough review of the management options for their renal mass, they elected to proceed with surgical treatment and the above procedure.  We have discussed the potential benefits and risks of the procedure, side effects of the proposed treatment, the likelihood of the patient achieving the goals of the procedure, and any potential problems that might occur during the procedure or recuperation. Informed consent has been obtained.   Description of procedure:  The patient was taken to the operating room and a general anesthetic was administered. The patient was given preoperative antibiotics, placed in the left modified flank position with care to pad all potential pressure points, and prepped and draped in the usual sterile fashion. Next a preoperative timeout was performed.  A site was selected on the left side of the umbilicus for placement of the camera port. This was placed using a standard open Hassan technique which allowed entry into the peritoneal cavity under direct vision and without difficulty. A 12 mm port was placed and a pneumoperitoneum established. The camera  was then used to inspect the abdomen and there was no evidence of any intra-abdominal injuries or other abnormalities except for a few adhesions between the colon and anterior abdominal wall. The remaining abdominal ports were then placed. 8 mm robotic ports were placed in the left upper quadrant, left lower quadrant, and far left lateral abdominal wall. A 12 mm port was placed in the upper midline for laparoscopic assistance. All ports were placed under direct vision without difficulty. The surgical cart was then docked.   Utilizing the cautery scissors, the white line of Toldt was incised allowing the colon to be mobilized medially and the plane between the mesocolon and the anterior layer of Gerota's fascia to be developed and the kidney to be exposed.  The ureter and gonadal vein were identified inferiorly and the ureter was lifted anteriorly off the psoas muscle.  Dissection proceeded superiorly along the gonadal vein until the renal vein was identified.  The renal hilum was then carefully isolated with a combination of blunt and sharp dissection allowing the renal arterial and venous structures to be separated and isolated in preparation for renal hilar vessel clamping. There was single renal artery and renal vein.  12.5 g of IV mannitol was then administered.   Attention turned to the kidney and the perinephric fat surrounding the renal mass was removed and the kidney was mobilized sufficiently for exposure and resection of the renal mass.   Intraoperative renal ultrasonography was utilized with the laparoscopic ultrasound probe to identify the renal tumor and identify the tumor margins. This allowed identification of the margins of the upper pole renal mass which  were marked with cautery.   Once the renal mass was properly isolated, preparations were made for resection of the tumor.  Reconstructive sutures were placed into the abdomen for the renorrhaphy portion of the procedure.  The renal artery  was then clamped with bulldog clamps.  The tumor was then excised with cold scissor dissection along with an adequate visible gross margin of normal renal parenchyma. The tumor appeared to be excised without any gross violation of the tumor. The renal collecting system was not entered during removal of the tumor.  A running 3-0 V-lock suture was then brought through the capsule of the kidney and run along the base of the renal defect to provide hemostasis and close any entry into the renal collecting system if present. Weck clips were used to secure this suture outside the renal capsule at the proximal and distal ends. An additional hemostatic agent (Surgiflo) was then placed into the renal defect. A running 2-0 V lock suture was then used to close the renal capsule using a sliding clip technique which resulted in excellent compression of the renal defect.    The bulldog clamps were then removed from the renal hilar vessel(s) and an additional 12.5 g of IV mannitol was administered. Total warm renal ischemia time was 14 minutes. The renal tumor resection site was examined. Hemostasis appeared adequate.   The kidney was placed back into its normal anatomic position and covered with perinephric fat as needed.  A # 84 Blake drain was then brought through the lateral lower port site and positioned in the perinephric space.  It was secured to the skin with a nylon suture. The surgical cart was undocked.  The renal tumor specimen was removed intact within an endopouch retrieval bag via the camera port sites.  The camera port site and the other 12 mm port site were then closed at the fascial level with 0-vicryl suture.  All other laparoscopic/robotic ports were removed under direct vision and the pneumoperitoneum let down with inspection of the operative field performed and hemostasis again confirmed. All incision sites were then injected with local anesthetic and reapproximated at the skin level with 4-0 monocryl  subcuticular closures.  Dermabond was applied to the skin.  The patient tolerated the procedure well and without complications.  The patient was able to be extubated and transferred to the recovery unit in satisfactory condition.  Pryor Curia MD

## 2015-01-06 NOTE — Progress Notes (Signed)
Day of Surgery   Subjective: Patient reports mild nausea which resolved with antiemetics. Also notes some shoulder pain bilaterally. Otherwise pain is well controlled. No other complaints.  Objective: Vital signs in last 24 hours: Temp:  [96.9 F (36.1 C)-98.9 F (37.2 C)] 97.8 F (36.6 C) (12/05 1413) Pulse Rate:  [61-86] 86 (12/05 1413) Resp:  [11-18] 14 (12/05 1413) BP: (107-136)/(59-88) 124/70 mmHg (12/05 1413) SpO2:  [100 %] 100 % (12/05 1413) Weight:  [59.563 kg (131 lb 5 oz)] 59.563 kg (131 lb 5 oz) (12/05 0555)  Intake/Output from previous day:   Intake/Output this shift: Total I/O In: 2520 [P.O.:120; I.V.:2400] Out: 23 [Urine:375; Drains:50; Blood:100]  Physical Exam:  General:cooperative and appears stated age GI: soft, appropriately TTP, incisions C/D/I, JP with SS output.  Female genitalia: foley catheter with clear yellow urine in bag Extremities: extremities normal, atraumatic, no cyanosis or edema  Lab Results:  Recent Labs  01/06/15 1107  HGB 11.6*  HCT 34.6*   BMET  Recent Labs  01/06/15 1107  NA 137  K 3.8  CL 103  CO2 28  GLUCOSE 151*  BUN 11  CREATININE 0.87  CALCIUM 8.5*   No results for input(s): LABPT, INR in the last 72 hours. No results for input(s): LABURIN in the last 72 hours. Results for orders placed or performed in visit on 05/06/13  Culture, Group A Strep     Status: None   Collection Time: 05/06/13  4:19 PM  Result Value Ref Range Status   Organism ID, Bacteria Normal Upper Respiratory Flora  Final   Organism ID, Bacteria No Beta Hemolytic Streptococci Isolated  Final    Studies/Results: No results found.  Assessment/Plan: Day of Surgery Procedure(s) (LRB): ROBOTIC ASSISTED PARTIAL NEPHRECTOMY (Left)   POD0 left partial nephrectomy. Recovering well.   Clears  mIVFb @ 150  Bedrest  Telemetry  Foley catheter to straight drain  F/u UOP  F/u JP output  IV morphine PRN  Sched IV  tylenol  Peri-operative ancef  SCDs   LOS: 0 days   Acie Fredrickson 01/06/2015, 3:05 PM

## 2015-01-06 NOTE — Discharge Instructions (Signed)

## 2015-01-06 NOTE — Anesthesia Procedure Notes (Signed)
Procedure Name: Intubation Date/Time: 01/06/2015 7:18 AM Performed by: Lind Covert Pre-anesthesia Checklist: Patient identified, Emergency Drugs available, Suction available, Patient being monitored and Timeout performed Patient Re-evaluated:Patient Re-evaluated prior to inductionOxygen Delivery Method: Circle system utilized Preoxygenation: Pre-oxygenation with 100% oxygen Intubation Type: IV induction Laryngoscope Size: Mac and 3 Grade View: Grade I Tube type: Oral Tube size: 7.0 mm Number of attempts: 1 Airway Equipment and Method: Stylet and Video-laryngoscopy (elective glidescope due to previous jaw problems) Placement Confirmation: ETT inserted through vocal cords under direct vision,  positive ETCO2 and breath sounds checked- equal and bilateral Secured at: 20 cm Tube secured with: Tape Dental Injury: Teeth and Oropharynx as per pre-operative assessment

## 2015-01-07 LAB — HEMOGLOBIN AND HEMATOCRIT, BLOOD
HEMATOCRIT: 33.5 % — AB (ref 36.0–46.0)
HEMATOCRIT: 34.9 % — AB (ref 36.0–46.0)
HEMOGLOBIN: 11.2 g/dL — AB (ref 12.0–15.0)
HEMOGLOBIN: 11.7 g/dL — AB (ref 12.0–15.0)

## 2015-01-07 LAB — BASIC METABOLIC PANEL
ANION GAP: 4 — AB (ref 5–15)
BUN: 10 mg/dL (ref 6–20)
CHLORIDE: 104 mmol/L (ref 101–111)
CO2: 28 mmol/L (ref 22–32)
Calcium: 8.3 mg/dL — ABNORMAL LOW (ref 8.9–10.3)
Creatinine, Ser: 0.86 mg/dL (ref 0.44–1.00)
GFR calc non Af Amer: >60 mL/min (ref >60)
Glucose, Bld: 156 mg/dL — ABNORMAL HIGH (ref 65–99)
POTASSIUM: 3.7 mmol/L (ref 3.5–5.1)
Sodium: 136 mmol/L (ref 135–145)

## 2015-01-07 MED ORDER — HYDROCODONE-ACETAMINOPHEN 5-325 MG PO TABS
1.0000 | ORAL_TABLET | Freq: Four times a day (QID) | ORAL | Status: DC | PRN
  Administered 2015-01-07 – 2015-01-08 (×3): 1 via ORAL
  Administered 2015-01-08: 2 via ORAL
  Administered 2015-01-09: 1 via ORAL
  Filled 2015-01-07 (×2): qty 1
  Filled 2015-01-07 (×3): qty 2
  Filled 2015-01-07: qty 1

## 2015-01-07 MED ORDER — ACETAMINOPHEN 325 MG PO TABS
650.0000 mg | ORAL_TABLET | Freq: Four times a day (QID) | ORAL | Status: DC | PRN
  Administered 2015-01-07 – 2015-01-09 (×3): 650 mg via ORAL
  Filled 2015-01-07 (×3): qty 2

## 2015-01-07 MED ORDER — BISACODYL 10 MG RE SUPP
10.0000 mg | Freq: Once | RECTAL | Status: AC
  Administered 2015-01-07: 10 mg via RECTAL
  Filled 2015-01-07: qty 1

## 2015-01-07 NOTE — Progress Notes (Signed)
Patient ID: Shelby Grant, female   DOB: 05/21/1955, 59 y.o.   MRN: [id number]  1 Day Post-Op PM  Subjective: Pt doing well.  Ambulating, tolerating full liquids. Voided after foley removal. No nausea or vomiting. Shoulder pain improved.  Pain controlled.  Objective: Vital signs in last 24 hours: Temp:  [97.9 F (36.6 C)-98.6 F (37 C)] 98.3 F (36.8 C) (12/06 1613) Pulse Rate:  [60-82] 82 (12/06 1613) Resp:  [16-20] 20 (12/06 1613) BP: (104-128)/(59-68) 128/64 mmHg (12/06 1613) SpO2:  [98 %-100 %] 98 % (12/06 1613)  Intake/Output from previous day: 12/05 0701 - 12/06 0700 In: 5632.5 [P.O.:120; I.V.:5212.5; IV Piggyback:300] Out: 3204 [Urine:2875; Drains:229; Blood:100] Intake/Output this shift: Total I/O In: 420 [P.O.:420] Out: 850 [Urine:850]  Physical Exam:  General: Alert and oriented CV: RRR Lungs: Clear Abdomen: Soft, ND, positive BS Incisions: C/D/I Ext: NT, No erythema  Lab Results:  Recent Labs  01/06/15 1107 01/07/15 0425 01/07/15 0826  HGB 11.6* 11.2* 11.7*  HCT 34.6* 33.5* 34.9*   BMET  Recent Labs  01/06/15 1107 01/07/15 0425  NA 137 136  K 3.8 3.7  CL 103 104  CO2 28 28  GLUCOSE 151* 156*  BUN 11 10  CREATININE 0.87 0.86  CALCIUM 8.5* 8.3*     Studies/Results: Path pending  Assessment/Plan: POD # 1 s/p left RAL partial nephrectomy - Ambulate, IS - Advance to regular diet - Medlock - Send JP for creatinine - Foley out & voiding - Monitor drain output, renal function   LOS: 1 day   Acie Fredrickson 01/07/2015, 4:22 PM

## 2015-01-07 NOTE — Progress Notes (Signed)
Patient ID: Shelby Grant, female   DOB: 02-05-1955, 59 y.o.   MRN: [id number]  1 Day Post-Op Subjective: Pt doing well.  No nausea or vomiting overnight. Shoulder pain improved.  Pain controlled.  Objective: Vital signs in last 24 hours: Temp:  [96.9 F (36.1 C)-98.6 F (37 C)] 98.5 F (36.9 C) (12/06 0530) Pulse Rate:  [60-86] 60 (12/06 0530) Resp:  [11-18] 16 (12/06 0530) BP: (104-136)/(59-88) 109/59 mmHg (12/06 0530) SpO2:  [100 %] 100 % (12/06 0530)  Intake/Output from previous day: 12/05 0701 - 12/06 0700 In: 4432.5 [P.O.:120; I.V.:4012.5; IV Piggyback:300] Out: 3204 [Urine:2875; Drains:229; Blood:100] Intake/Output this shift:    Physical Exam:  General: Alert and oriented CV: RRR Lungs: Clear Abdomen: Soft, ND, positive BS Incisions: C/D/I Ext: NT, No erythema  Lab Results:  Recent Labs  01/06/15 1107 01/07/15 0425  HGB 11.6* 11.2*  HCT 34.6* 33.5*   BMET  Recent Labs  01/06/15 1107 01/07/15 0425  NA 137 136  K 3.8 3.7  CL 103 104  CO2 28 28  GLUCOSE 151* 156*  BUN 11 10  CREATININE 0.87 0.86  CALCIUM 8.5* 8.3*     Studies/Results: Path pending  Assessment/Plan: POD # 1 s/p left RAL partial nephrectomy - Ambulate, IS - Advance diet as tolerated - Decrease IVF - D/C catheter - Monitor drain output, renal function   LOS: 1 day   Maat Kafer,LES 01/07/2015, 7:26 AM

## 2015-01-08 ENCOUNTER — Inpatient Hospital Stay (HOSPITAL_COMMUNITY): Payer: No Typology Code available for payment source

## 2015-01-08 LAB — TROPONIN I: Troponin I: <0.03 ng/mL (ref <0.031)

## 2015-01-08 LAB — BASIC METABOLIC PANEL
Anion gap: 5 (ref 5–15)
BUN: 9 mg/dL (ref 6–20)
CO2: 29 mmol/L (ref 22–32)
CREATININE: 0.83 mg/dL (ref 0.44–1.00)
Calcium: 8.9 mg/dL (ref 8.9–10.3)
Chloride: 106 mmol/L (ref 101–111)
GFR calc Af Amer: >60 mL/min (ref >60)
GLUCOSE: 102 mg/dL — AB (ref 65–99)
POTASSIUM: 3.8 mmol/L (ref 3.5–5.1)
Sodium: 140 mmol/L (ref 135–145)

## 2015-01-08 LAB — CREATININE, FLUID (PLEURAL, PERITONEAL, JP DRAINAGE): CREAT FL: 0.8 mg/dL

## 2015-01-08 LAB — HEMOGLOBIN AND HEMATOCRIT, BLOOD
HCT: 35.7 % — ABNORMAL LOW (ref 36.0–46.0)
Hemoglobin: 11.7 g/dL — ABNORMAL LOW (ref 12.0–15.0)

## 2015-01-08 MED ORDER — BISACODYL 10 MG RE SUPP
10.0000 mg | Freq: Once | RECTAL | Status: AC
  Administered 2015-01-08: 10 mg via RECTAL
  Filled 2015-01-08: qty 1

## 2015-01-08 NOTE — Discharge Summary (Signed)
Physician Discharge Summary  Patient ID: Shelby Grant MRN: [id number] DOB/AGE: 1955/04/07 59 y.o.  Admit date: 01/06/2015 Discharge date: 01/09/2015  Admission Diagnoses: Renal mass  Discharge Diagnoses:  Active Problems:   Renal neoplasm   Discharged Condition: good  Hospital Course:  59 yo female who is s/p left robotic partial nephrectomy with Dr. Alinda Money. She did well post-operatively. The morning of POD#2 she had some right chest and shoulder pain accompanied with stable vital signs and a saturation of 95%. A full cardiopulmonary work up was performed and her EKG was normal, troponins normal x 2, and CXR demonstrated only a small left pleural effusion which was to be expected following her left partial nephrectomy. The pain resolved and was ultimately felt to be musculoskeletal in nature. Her diet was slowly advanced and at the time of discharge she was tolerating a regular diet, ambulating at her baseline, was voiding spontaneously after foley catheter removal, and pain was well controlled with oral narcotics. JP creatinine was equal to serum on POD#1. JP drain was removed prior to discharge. She was discharged to home on POD#3.  Consults: None  Significant Diagnostic Studies: EKG - normal. Troponins <0.03 x 2, CXR with only small left pleural effusion  Treatments: surgery:  left robotic partial nephrectomy  Discharge Exam: Blood pressure 102/61, pulse 79, temperature 98.3 F (36.8 C), temperature source Oral, resp. rate 18, height 5' 6.5" (1.689 m), weight 59.563 kg (131 lb 5 oz), SpO2 97 %. General appearance: alert, cooperative and appears stated age Head: Normocephalic, without obvious abnormality, atraumatic GI: soft, appropriately TTP, non-distended, incisions C/D/I. Extremities: extremities normal, atraumatic, no cyanosis or edema  Disposition: 01-Home or Self Care     Medication List    STOP taking these medications        bifidobacterium infantis capsule     CALCIUM MAGNESIUM PO     multivitamin capsule     TURMERIC PO     VITAMIN B-6 PO     VITAMIN D (CHOLECALCIFEROL) PO      TAKE these medications        buPROPion 300 MG 24 hr tablet  Commonly known as:  WELLBUTRIN XL  take 1 tablet by mouth once daily     clonazePAM 1 MG tablet  Commonly known as:  KLONOPIN  Take 1 tablet (1 mg total) by mouth daily.     HYDROcodone-acetaminophen 5-325 MG tablet  Commonly known as:  NORCO  Take 1-2 tablets by mouth every 6 (six) hours as needed.     metoprolol succinate 25 MG 24 hr tablet  Commonly known as:  TOPROL-XL  Take 0.5 tablets (12.5 mg total) by mouth 2 (two) times daily.     PREMARIN vaginal cream  Generic drug:  conjugated estrogens  Place 1 Applicatorful vaginally See admin instructions. Use twice a week on Wednesday and Sunday       Follow-up Information    Follow up with Dutch Gray, MD On 02/04/2015.   Specialty:  Urology   Why:  at 10:30   Contact information:   [address] [place] [address] [phone]       Signed: Acie Fredrickson 01/09/2015, 6:57 AM

## 2015-01-08 NOTE — Progress Notes (Signed)
Pt resting, awaken easily, decreased discomfort, VSs, O2 sat on 2l 99%. Able to take in deep breaths, states to breathing has improved. SRP, RN

## 2015-01-08 NOTE — Progress Notes (Signed)
Patient ID: Phylliss Bob, female   DOB: 15-Apr-1955, 59 y.o.   MRN: [id number]  2 Days Post-Op Subjective: Pt developed acute chest pain this morning at 4 AM with acute shortness of breath. Her oxygen saturation levels were 95% on room air.  ECG was performed and was normal. Troponins x 2 normal. CXR with small left pleural effusion 2/2 recent left partial. She reports some gas pain and abdominal distension.  No nausea or vomiting or leg pain.  Some blood in her urine today.  Objective: Vital signs in last 24 hours: Temp:  [98.3 F (36.8 C)-100.5 F (38.1 C)] 98.6 F (37 C) (12/07 1445) Pulse Rate:  [79-88] 82 (12/07 1445) Resp:  [18-20] 20 (12/07 1445) BP: (104-135)/(52-95) 119/70 mmHg (12/07 1445) SpO2:  [97 %-99 %] 98 % (12/07 1445)  Intake/Output from previous day: 12/06 0701 - 12/07 0700 In: 540 [P.O.:540] Out: 1496 [Urine:1450; Drains:45; Stool:1] Intake/Output this shift: Total I/O In: 480 [P.O.:480] Out: 1045 [Urine:1000; Drains:45]  Physical Exam:  General: Alert and oriented CV: RRR Lungs: Clear Abdomen: Soft, mildly distended, Normal BS, JP drain with SS output Incisions: C/D/I Ext: NT, No erythema, No calf tenderness  Lab Results:  Recent Labs  01/07/15 0425 01/07/15 0826 01/08/15 0806  HGB 11.2* 11.7* 11.7*  HCT 33.5* 34.9* 35.7*   BMET  Recent Labs  01/07/15 0425 01/08/15 0418  NA 136 140  K 3.7 3.8  CL 104 106  CO2 28 29  GLUCOSE 156* 102*  BUN 10 9  CREATININE 0.86 0.83  CALCIUM 8.3* 8.9     Assessment/Plan: POD # 2 s/p partial nephrectomy  - Repeat 3rd Troponin to complete chest pain evaluation - If troponin remains normal no additional work up is indicated - Continue ambulation, IS - Suppository today - Drain Cr serum - will remove drain this afternoon - Plan for D/C tomorrow   LOS: 2 days   Acie Fredrickson 01/08/2015, 3:59 PM

## 2015-01-08 NOTE — Progress Notes (Signed)
Patient ID: Shelby Grant, female   DOB: [birth year], 59 y.o.   MRN: [id number]  2 Days Post-Op Subjective: Pt developed acute chest pain this morning at 4 AM with acute shortness of breath. Her oxygen saturation levels were 95% on room air.  ECG was performed and was normal.  Her chest pain was across her upper chest and upper abdomen.  No nausea or vomiting or leg pain.  Her symptoms have now resolved.  Objective: Vital signs in last 24 hours: Temp:  [98.3 F (36.8 C)-100.5 F (38.1 C)] 98.6 F (37 C) (12/07 0542) Pulse Rate:  [79-88] 79 (12/07 0542) Resp:  [18-20] 18 (12/07 0542) BP: (104-135)/(52-95) 104/52 mmHg (12/07 0542) SpO2:  [97 %-99 %] 99 % (12/07 0542)  Intake/Output from previous day: 12/06 0701 - 12/07 0700 In: 540 [P.O.:540] Out: 1496 [Urine:1450; Drains:45; Stool:1] Intake/Output this shift:    Physical Exam:  General: Alert and oriented CV: RRR Lungs: Clear Abdomen: Soft, ND, Normal BS Incisions: C/D/I Ext: NT, No erythema, No calf tenderness  Lab Results:  Recent Labs  01/06/15 1107 01/07/15 0425 01/07/15 0826  HGB 11.6* 11.2* 11.7*  HCT 34.6* 33.5* 34.9*   BMET  Recent Labs  01/07/15 0425 01/08/15 0418  NA 136 140  K 3.7 3.8  CL 104 106  CO2 28 29  GLUCOSE 156* 102*  BUN 10 9  CREATININE 0.86 0.83  CALCIUM 8.3* 8.9     Assessment/Plan: POD # 2 s/p partial nephrectomy - Check CXR, Troponin to evaluate chest pain further - Continue ambulation, IS - Path pending - Drain Cr serum - will remove drain - Will hold discharge until chest pain further evaluated.   LOS: 2 days   Joevanni Roddey,LES 01/08/2015, 7:25 AM

## 2015-01-08 NOTE — Progress Notes (Signed)
Pt called RN to room, stating she feels "short of breath", VSs, O2 sats 95 on room air, EKG completed, NSR. C/O of discomfort in upper chest area, stating unable to take a deep breath. Pain med givne as ordered.. Placed pt on 02 2l for additional support. Lungs clear, IS 750 volume. Will cont to monitor. SRP, RN

## 2015-01-08 NOTE — Progress Notes (Signed)
Patient ID: Shelby Grant, female   DOB: [birth year], 59 y.o.   MRN: [id number]  Pathology: pT1a Nx Mx, Fuhrman grade III papillary RCC with negative surgical margins  This results and its implications were discussed with the patient today.

## 2015-01-08 NOTE — Progress Notes (Signed)
Pt up to bathroom, tolerated without increase discomfort.  Skin warm and dry, afebrile, VSs. No acute distress noted. O2 2l for support. Will cont to monitor. SRP, RN

## 2015-01-09 LAB — TROPONIN I: Troponin I: <0.03 ng/mL (ref <0.031)

## 2015-01-09 LAB — HEMOGLOBIN AND HEMATOCRIT, BLOOD
HCT: 35.1 % — ABNORMAL LOW (ref 36.0–46.0)
HEMOGLOBIN: 11.6 g/dL — AB (ref 12.0–15.0)

## 2015-01-09 NOTE — Progress Notes (Signed)
Patient ID: Shelby Grant, female   DOB: 14-Feb-1955, 59 y.o.   MRN: [id number]  3 Days Post-Op Subjective: NAE O/N. No complaints this morning. 3rd troponin was normal.  No nausea or vomiting or leg pain.    Objective: Vital signs in last 24 hours: Temp:  [98.3 F (36.8 C)-98.6 F (37 C)] 98.3 F (36.8 C) (12/08 0520) Pulse Rate:  [79-86] 79 (12/08 0520) Resp:  [18-20] 18 (12/08 0520) BP: (102-125)/(61-73) 102/61 mmHg (12/08 0520) SpO2:  [96 %-98 %] 97 % (12/08 0520)  Intake/Output from previous day: 12/07 0701 - 12/08 0700 In: 480 [P.O.:480] Out: 1345 [Urine:1300; Drains:45] Intake/Output this shift:    Physical Exam:  General: Alert and oriented CV: RRR Lungs: Clear Abdomen: Soft, non-dstended, Normal BS Incisions: C/D/I Ext: NT, No erythema, No calf tenderness  Lab Results:  Recent Labs  01/07/15 0425 01/07/15 0826 01/08/15 0806  HGB 11.2* 11.7* 11.7*  HCT 33.5* 34.9* 35.7*   BMET  Recent Labs  01/07/15 0425 01/08/15 0418  NA 136 140  K 3.7 3.8  CL 104 106  CO2 28 29  GLUCOSE 156* 102*  BUN 10 9  CREATININE 0.86 0.83  CALCIUM 8.3* 8.9     Assessment/Plan: POD # 3s/p partial nephrectomy  - F/u Am hemoglobin - If hemoglobin stable d/c to home this AM - Continue ambulation, IS - Plan for D/C this AM   LOS: 3 days   Acie Fredrickson 01/09/2015, 6:57 AM

## 2015-01-16 DIAGNOSIS — C801 Malignant (primary) neoplasm, unspecified: Secondary | ICD-10-CM

## 2015-01-16 HISTORY — DX: Malignant (primary) neoplasm, unspecified: C80.1

## 2015-01-24 ENCOUNTER — Emergency Department (HOSPITAL_COMMUNITY)
Admission: EM | Admit: 2015-01-24 | Discharge: 2015-01-25 | Disposition: A | Discharge status: Home / Self Care | Payer: No Typology Code available for payment source | Attending: Emergency Medicine | Admitting: Emergency Medicine

## 2015-01-24 DIAGNOSIS — Z880 Allergy status to penicillin: Secondary | ICD-10-CM | POA: Diagnosis not present

## 2015-01-24 DIAGNOSIS — Z79899 Other long term (current) drug therapy: Secondary | ICD-10-CM | POA: Insufficient documentation

## 2015-01-24 DIAGNOSIS — Z85528 Personal history of other malignant neoplasm of kidney: Secondary | ICD-10-CM | POA: Diagnosis not present

## 2015-01-24 DIAGNOSIS — Z8742 Personal history of other diseases of the female genital tract: Secondary | ICD-10-CM | POA: Insufficient documentation

## 2015-01-24 DIAGNOSIS — Z91040 Latex allergy status: Secondary | ICD-10-CM | POA: Diagnosis not present

## 2015-01-24 DIAGNOSIS — Z872 Personal history of diseases of the skin and subcutaneous tissue: Secondary | ICD-10-CM | POA: Insufficient documentation

## 2015-01-24 DIAGNOSIS — Z8669 Personal history of other diseases of the nervous system and sense organs: Secondary | ICD-10-CM | POA: Diagnosis not present

## 2015-01-24 DIAGNOSIS — F329 Major depressive disorder, single episode, unspecified: Secondary | ICD-10-CM | POA: Diagnosis not present

## 2015-01-24 DIAGNOSIS — F419 Anxiety disorder, unspecified: Secondary | ICD-10-CM | POA: Diagnosis not present

## 2015-01-24 DIAGNOSIS — Z8709 Personal history of other diseases of the respiratory system: Secondary | ICD-10-CM | POA: Insufficient documentation

## 2015-01-24 DIAGNOSIS — R079 Chest pain, unspecified: Secondary | ICD-10-CM

## 2015-01-24 DIAGNOSIS — Z8679 Personal history of other diseases of the circulatory system: Secondary | ICD-10-CM | POA: Insufficient documentation

## 2015-01-24 DIAGNOSIS — M1990 Unspecified osteoarthritis, unspecified site: Secondary | ICD-10-CM | POA: Insufficient documentation

## 2015-01-24 HISTORY — DX: Malignant (primary) neoplasm, unspecified: C80.1

## 2015-01-25 ENCOUNTER — Emergency Department (HOSPITAL_COMMUNITY): Payer: No Typology Code available for payment source

## 2015-01-25 ENCOUNTER — Encounter (HOSPITAL_COMMUNITY): Payer: Self-pay | Admitting: Emergency Medicine

## 2015-01-25 LAB — BASIC METABOLIC PANEL
ANION GAP: 7 (ref 5–15)
BUN: 15 mg/dL (ref 6–20)
CALCIUM: 9.2 mg/dL (ref 8.9–10.3)
CO2: 30 mmol/L (ref 22–32)
CREATININE: 0.91 mg/dL (ref 0.44–1.00)
Chloride: 104 mmol/L (ref 101–111)
GFR calc Af Amer: >60 mL/min (ref >60)
GLUCOSE: 158 mg/dL — AB (ref 65–99)
Potassium: 3.8 mmol/L (ref 3.5–5.1)
Sodium: 141 mmol/L (ref 135–145)

## 2015-01-25 LAB — CBC WITH DIFFERENTIAL/PLATELET
BASOS ABS: 0.1 10*3/uL (ref 0.0–0.1)
BASOS PCT: 0 %
EOS PCT: 3 %
Eosinophils Absolute: 0.4 10*3/uL (ref 0.0–0.7)
HCT: 36.1 % (ref 36.0–46.0)
Hemoglobin: 11.7 g/dL — ABNORMAL LOW (ref 12.0–15.0)
LYMPHS PCT: 17 %
Lymphs Abs: 2 10*3/uL (ref 0.7–4.0)
MCH: 29.7 pg (ref 26.0–34.0)
MCHC: 32.4 g/dL (ref 30.0–36.0)
MCV: 91.6 fL (ref 78.0–100.0)
MONO ABS: 0.8 10*3/uL (ref 0.1–1.0)
Monocytes Relative: 7 %
Neutro Abs: 8.5 10*3/uL — ABNORMAL HIGH (ref 1.7–7.7)
Neutrophils Relative %: 73 %
PLATELETS: 355 10*3/uL (ref 150–400)
RBC: 3.94 MIL/uL (ref 3.87–5.11)
RDW: 13.1 % (ref 11.5–15.5)
WBC: 11.6 10*3/uL — ABNORMAL HIGH (ref 4.0–10.5)

## 2015-01-25 LAB — HEPATIC FUNCTION PANEL
ALBUMIN: 3.1 g/dL — AB (ref 3.5–5.0)
ALK PHOS: 55 U/L (ref 38–126)
ALT: 13 U/L — AB (ref 14–54)
AST: 15 U/L (ref 15–41)
BILIRUBIN TOTAL: 0.4 mg/dL (ref 0.3–1.2)
Bilirubin, Direct: <0.1 mg/dL — ABNORMAL LOW (ref 0.1–0.5)
Total Protein: 5.6 g/dL — ABNORMAL LOW (ref 6.5–8.1)

## 2015-01-25 LAB — LIPASE, BLOOD: Lipase: 21 U/L (ref 11–51)

## 2015-01-25 LAB — I-STAT TROPONIN, ED
Troponin i, poc: 0 ng/mL (ref 0.00–0.08)
Troponin i, poc: 0 ng/mL (ref 0.00–0.08)

## 2015-01-25 LAB — D-DIMER, QUANTITATIVE (NOT AT ARMC): D DIMER QUANT: 0.53 ug{FEU}/mL — AB (ref 0.00–0.50)

## 2015-01-25 MED ORDER — MORPHINE SULFATE (PF) 4 MG/ML IV SOLN
4.0000 mg | Freq: Once | INTRAVENOUS | Status: AC
  Administered 2015-01-25: 4 mg via INTRAVENOUS
  Filled 2015-01-25: qty 1

## 2015-01-25 MED ORDER — IOHEXOL 350 MG/ML SOLN
80.0000 mL | Freq: Once | INTRAVENOUS | Status: AC | PRN
  Administered 2015-01-25: 80 mL via INTRAVENOUS

## 2015-01-25 MED ORDER — ONDANSETRON HCL 4 MG/2ML IJ SOLN
4.0000 mg | Freq: Once | INTRAMUSCULAR | Status: AC
  Administered 2015-01-25: 4 mg via INTRAVENOUS
  Filled 2015-01-25: qty 2

## 2015-01-25 MED ORDER — SODIUM CHLORIDE 0.9 % IV BOLUS (SEPSIS)
1000.0000 mL | Freq: Once | INTRAVENOUS | Status: AC
  Administered 2015-01-25: 1000 mL via INTRAVENOUS

## 2015-01-25 MED ORDER — SODIUM CHLORIDE 0.9 % IV SOLN
INTRAVENOUS | Status: DC
  Administered 2015-01-25: 02:00:00 via INTRAVENOUS

## 2015-01-25 NOTE — Discharge Instructions (Signed)
Nonspecific Chest Pain  °Chest pain can be caused by many different conditions. There is always a chance that your pain could be related to something serious, such as a heart attack or a blood clot in your lungs. Chest pain can also be caused by conditions that are not life-threatening. If you have chest pain, it is very important to follow up with your health care provider. °CAUSES  °Chest pain can be caused by: °· Heartburn. °· Pneumonia or bronchitis. °· Anxiety or stress. °· Inflammation around your heart (pericarditis) or lung (pleuritis or pleurisy). °· A blood clot in your lung. °· A collapsed lung (pneumothorax). It can develop suddenly on its own (spontaneous pneumothorax) or from trauma to the chest. °· Shingles infection (varicella-zoster virus). °· Heart attack. °· Damage to the bones, muscles, and cartilage that make up your chest wall. This can include: °¨ Bruised bones due to injury. °¨ Strained muscles or cartilage due to frequent or repeated coughing or overwork. °¨ Fracture to one or more ribs. °¨ Sore cartilage due to inflammation (costochondritis). °RISK FACTORS  °Risk factors for chest pain may include: °· Activities that increase your risk for trauma or injury to your chest. °· Respiratory infections or conditions that cause frequent coughing. °· Medical conditions or overeating that can cause heartburn. °· Heart disease or family history of heart disease. °· Conditions or health behaviors that increase your risk of developing a blood clot. °· Having had chicken pox (varicella zoster). °SIGNS AND SYMPTOMS °Chest pain can feel like: °· Burning or tingling on the surface of your chest or deep in your chest. °· Crushing, pressure, aching, or squeezing pain. °· Dull or sharp pain that is worse when you move, cough, or take a deep breath. °· Pain that is also felt in your back, neck, shoulder, or arm, or pain that spreads to any of these areas. °Your chest pain may come and go, or it may stay  constant. °DIAGNOSIS °Lab tests or other studies may be needed to find the cause of your pain. Your health care provider may have you take a test called an ambulatory ECG (electrocardiogram). An ECG records your heartbeat patterns at the time the test is performed. You may also have other tests, such as: °· Transthoracic echocardiogram (TTE). During echocardiography, sound waves are used to create a picture of all of the heart structures and to look at how blood flows through your heart. °· Transesophageal echocardiogram (TEE). This is a more advanced imaging test that obtains images from inside your body. It allows your health care provider to see your heart in finer detail. °· Cardiac monitoring. This allows your health care provider to monitor your heart rate and rhythm in real time. °· Holter monitor. This is a portable device that records your heartbeat and can help to diagnose abnormal heartbeats. It allows your health care provider to track your heart activity for several days, if needed. °· Stress tests. These can be done through exercise or by taking medicine that makes your heart beat more quickly. °· Blood tests. °· Imaging tests. °TREATMENT  °Your treatment depends on what is causing your chest pain. Treatment may include: °· Medicines. These may include: °¨ Acid blockers for heartburn. °¨ Anti-inflammatory medicine. °¨ Pain medicine for inflammatory conditions. °¨ Antibiotic medicine, if an infection is present. °¨ Medicines to dissolve blood clots. °¨ Medicines to treat coronary artery disease. °· Supportive care for conditions that do not require medicines. This may include: °¨ Resting. °¨ Applying heat   or cold packs to injured areas. °¨ Limiting activities until pain decreases. °HOME CARE INSTRUCTIONS °· If you were prescribed an antibiotic medicine, finish it all even if you start to feel better. °· Avoid any activities that bring on chest pain. °· Do not use any tobacco products, including  cigarettes, chewing tobacco, or electronic cigarettes. If you need help quitting, ask your health care provider. °· Do not drink alcohol. °· Take medicines only as directed by your health care provider. °· Keep all follow-up visits as directed by your health care provider. This is important. This includes any further testing if your chest pain does not go away. °· If heartburn is the cause for your chest pain, you may be told to keep your head raised (elevated) while sleeping. This reduces the chance that acid will go from your stomach into your esophagus. °· Make lifestyle changes as directed by your health care provider. These may include: °¨ Getting regular exercise. Ask your health care provider to suggest some activities that are safe for you. °¨ Eating a heart-healthy diet. A registered dietitian can help you to learn healthy eating options. °¨ Maintaining a healthy weight. °¨ Managing diabetes, if necessary. °¨ Reducing stress. °SEEK MEDICAL CARE IF: °· Your chest pain does not go away after treatment. °· You have a rash with blisters on your chest. °· You have a fever. °SEEK IMMEDIATE MEDICAL CARE IF:  °· Your chest pain is worse. °· You have an increasing cough, or you cough up blood. °· You have severe abdominal pain. °· You have severe weakness. °· You faint. °· You have chills. °· You have sudden, unexplained chest discomfort. °· You have sudden, unexplained discomfort in your arms, back, neck, or jaw. °· You have shortness of breath at any time. °· You suddenly start to sweat, or your skin gets clammy. °· You feel nauseous or you vomit. °· You suddenly feel light-headed or dizzy. °· Your heart begins to beat quickly, or it feels like it is skipping beats. °These symptoms may represent a serious problem that is an emergency. Do not wait to see if the symptoms will go away. Get medical help right away. Call your local emergency services (911 in the [place] Do not drive yourself to the hospital. °  °This  information is not intended to replace advice given to you by your health care provider. Make sure you discuss any questions you have with your health care provider. °  °Document Released: 10/28/2004 Document Revised: 02/08/2014 Document Reviewed: 08/24/2013 °Elsevier Interactive Patient Education ©2016 Elsevier Inc. ° °

## 2015-01-25 NOTE — ED Notes (Signed)
Pt stated that she was standing at her sink earlier this evening, when she had an acute onset of right sided chest pain. Pt described her pain as a tightness with pain upon inspiration. Pt took 324mg  of ASA prior to EMS arrival, and one nitroglycerin pill enroute to the hospital. Pt had no relief of chest pain.

## 2015-01-25 NOTE — ED Notes (Signed)
Pt left with all her belongings and ambulated out of the treatment area.  

## 2015-01-25 NOTE — ED Provider Notes (Signed)
By signing my name below, I, Randa Evens, attest that this documentation has been prepared under the direction and in the presence of [employer], DO. Electronically Signed: Randa Evens, ED Scribe. 01/25/2015. 12:37 AM.  TIME SEEN: 12:27 AM   CHIEF COMPLAINT: Chest Pain  HPI: HPI Comments: Shelby Grant is a 59 y.o. female with h/o SVT, anxiety who presents to the Emergency Department complaining of new right-sided chest heaviness onset yesterday turning in. Pt states that tonight about 2 hours PTA the heaviness started to get worse prior to progressing into a sharp pain. Pt states she had associated SOB and nausea. Pt states that she tried lying down with no relief. Pt states that pain is worse with deep breathing. It is not exertional. She states that she has had 324 mg of Asprin prior to the EMS arriving. She states that EMS gave her nitroglycerin that has provided some relief. Pt denies fever, cough, diaphoresis or dizziness. Pt denies Hx of tobacco use. Pt denies family HX of CAD. Pt denies Hx of PE or DVT. Pt reports use of metoprolol for SVT. Hx of partial left nephrectomy on December 5th, 2016. Has hydrocodone at home but has not been taking it for her pain.  ROS: See HPI Constitutional: no fever  Eyes: no drainage  ENT: no runny nose   Cardiovascular: chest pain  Resp:  SOB  GI: no vomiting GU: no dysuria Integumentary: no rash  Allergy: no hives  Musculoskeletal: no leg swelling  Neurological: no slurred speech ROS otherwise negative  PAST MEDICAL HISTORY/PAST SURGICAL HISTORY:  Past Medical History  Diagnosis Date  . ANXIETY 06/02/2007  . Supraventricular tachycardia ([place]) 06/02/2007    Atrial tachycardia with Wenckebach periodicity  . ALLERGIC RHINITIS 07/03/2008  . IRREGULAR MENSES 06/03/2008  . CELLULITIS, LEFT LEG 09/14/2008  . CORNS AND CALLUSES 07/03/2008  . FOOT PAIN 09/22/2009  . CHEST PAIN 07/07/2009  . OCD (obsessive compulsive disorder)   . Depression   .  Arthritis   . BELL'S PALSY, LEFT 12/02/2008  . Neoplasm     left renal  . Complication of anesthesia 08-28-2014    "work up in recovery room with jaw locked open"  . Stress fracture of ankle since Jun 29, 2014    left    MEDICATIONS:  Prior to Admission medications   Medication Sig Start Date End Date Taking? Authorizing Provider  buPROPion (WELLBUTRIN XL) 300 MG 24 hr tablet take 1 tablet by mouth once daily Patient taking differently: Take 300 mg by mouth daily.  12/03/14   Dorena Cookey, MD  clonazePAM (KLONOPIN) 1 MG tablet Take 1 tablet (1 mg total) by mouth daily. Patient taking differently: Take 1 mg by mouth at bedtime.  12/03/14   Dorena Cookey, MD  HYDROcodone-acetaminophen (NORCO) 5-325 MG tablet Take 1-2 tablets by mouth every 6 (six) hours as needed. 01/06/15   Debbrah Alar, PA-C  metoprolol succinate (TOPROL-XL) 25 MG 24 hr tablet Take 0.5 tablets (12.5 mg total) by mouth 2 (two) times daily. Patient taking differently: Take 12.5 mg by mouth at bedtime. Patient takes only at hs 12/03/14   Dorena Cookey, MD  PREMARIN vaginal cream Place 1 Applicatorful vaginally See admin instructions. Use twice a week on Wednesday and Sunday 11/15/14   Historical Provider, MD    ALLERGIES:  Allergies  Allergen Reactions  . Latex     REACTION: rash  . Amoxicillin Rash    Has patient had a PCN reaction causing  immediate rash, facial/tongue/throat swelling, SOB or lightheadedness with hypotension: unkown Has patient had a PCN reaction causing severe rash involving mucus membranes or skin necrosis: no Has patient had a PCN reaction that required hospitalization No Has patient had a PCN reaction occurring within the last 10 years: unknown If all of the above answers are "NO", then may proceed with Cephalosporin use.    SOCIAL HISTORY:  Social History  Substance Use Topics  . Smoking status: Never Smoker   . Smokeless tobacco: Never Used  . Alcohol Use: Yes     Comment: social     FAMILY HISTORY: Family History  Problem Relation Age of Onset  . Asthma Sister   . Cancer Mother     [facility] where cancer started     EXAM: BP 111/78 mmHg  Pulse 82  Temp(Src) 97.7 F (36.5 C) (Oral)  Resp 21  Ht 5' 6.5" (1.689 m)  Wt 130 lb (58.968 kg)  BMI 20.67 kg/m2  SpO2 99%  LMP 07/10/2012   CONSTITUTIONAL: Alert and oriented and responds appropriately to questions. Well-appearing; well-nourished HEAD: Normocephalic EYES: Conjunctivae clear, PERRL ENT: normal nose; no rhinorrhea; moist mucous membranes; pharynx without lesions noted NECK: Supple, no meningismus, no LAD  CARD: RRR; S1 and S2 appreciated; no murmurs, no clicks, no rubs, no gallops Chest: No tenderness over right chest no crepitus, no ecchymosis, no deformity or lesions  RESP: Normal chest excursion without splinting or tachypnea; breath sounds clear and equal bilaterally; no wheezes, no rhonchi, no rales, no hypoxia or respiratory distress, speaking full sentences ABD/GI: Normal bowel sounds; non-distended; soft, minimally tender throughout abdomen appropriately after surgery, multiple laparoscopic incisions that are clean dry and intact without erythema warmth or drainage, no rebound, no guarding, no peritoneal signs BACK:  The back appears normal and is non-tender to palpation, there is no CVA tenderness EXT: Normal ROM in all joints; non-tender to palpation; no edema; normal capillary refill; no cyanosis, no calf tenderness or swelling    SKIN: Normal color for age and race; warm NEURO: Moves all extremities equally, sensation to light touch intact diffusely, cranial nerves II through XII intact PSYCH: The patient's mood and manner are appropriate. Grooming and personal hygiene are appropriate.  MEDICAL DECISION MAKING: Patient here with right-sided chest pain. Hemodynamically stable. No risk factors for ACS but is at risk for pulmonary embolus given her recent hospitalization and surgery. Is  hemodynamically stable with no tachycardia, tachypnea, hypoxia. Will proceed with troponin and d-dimer, chest x-ray. We'll give IV fluids, morphine for pain. Abdominal exam is benign.  ED PROGRESS: 2:00 AM  Pt reports her pain is improved but not completely gone. Troponin negative. Chest x-ray clear. D-dimer is positive. We'll proceed with CT of her chest to rule out pulmonary embolus.    CT scan shows no pulmonary embolus, infiltrate, edema, pneumothorax. Pain improved and she does not one anything else for pain at this time. Second troponin is negative. LFTs and lipase also unremarkable. Discussed with patient that I feel we have successfully ruled out ACS, PE, pneumonia. I recommended if pain continues she can use her hydrocodone. Discussed return precautions and recommended close outpatient follow-up. Repeat abdominal exams have been benign. She's been able to drink without difficulty. Patient and husband at bedside verbalize understanding and are comfortable with plan.   EKG Interpretation  Date/Time:  Saturday January 25 2015 00:01:42 EST Ventricular Rate:  82 PR Interval:  143 QRS Duration: 126 QT Interval:  376 QTC Calculation: 439 R Axis:  70 Text Interpretation:  Sinus rhythm Probable left atrial enlargement Nonspecific intraventricular conduction delay Nonspecific T abnormalities, inferior leads No significant change since last tracing Confirmed by Shataya Winkles,  DO, Manny Vitolo [id number]) on 01/25/2015 12:15:37 AM         I personally performed the services described in this documentation, which was scribed in my presence. The recorded information has been reviewed and is accurate.    [place], DO 01/25/15 [phone]

## 2015-03-03 LAB — HM MAMMOGRAPHY

## 2015-03-04 ENCOUNTER — Encounter: Payer: Self-pay | Admitting: Family Medicine

## 2015-03-07 ENCOUNTER — Telehealth: Payer: Self-pay | Admitting: *Deleted

## 2015-03-07 DIAGNOSIS — F411 Generalized anxiety disorder: Secondary | ICD-10-CM

## 2015-03-07 NOTE — Telephone Encounter (Signed)
Patient would like to know if Dr Sherren Mocha will continue refilling her meds or if she should have Dr Caprice Beaver refill them?

## 2015-03-10 MED ORDER — BUPROPION HCL ER (XL) 300 MG PO TB24: ORAL_TABLET | ORAL | Status: DC

## 2015-03-10 NOTE — Telephone Encounter (Signed)
Okay to fill per Dr Sherren Mocha if patient is not longer seeing Dr Caprice Beaver.  Patient is no longer seeing Dr Caprice Beaver.

## 2015-07-12 ENCOUNTER — Ambulatory Visit (INDEPENDENT_AMBULATORY_CARE_PROVIDER_SITE_OTHER): Payer: BLUE CROSS/BLUE SHIELD | Admitting: Urgent Care

## 2015-07-12 ENCOUNTER — Ambulatory Visit (INDEPENDENT_AMBULATORY_CARE_PROVIDER_SITE_OTHER): Payer: BLUE CROSS/BLUE SHIELD

## 2015-07-12 VITALS — BP 100/84 | HR 77 | Temp 98.7°F | Ht 67.5 in | Wt 134.0 lb

## 2015-07-12 DIAGNOSIS — W5501XA Bitten by cat, initial encounter: Secondary | ICD-10-CM | POA: Diagnosis not present

## 2015-07-12 DIAGNOSIS — M7989 Other specified soft tissue disorders: Secondary | ICD-10-CM | POA: Diagnosis not present

## 2015-07-12 DIAGNOSIS — M79644 Pain in right finger(s): Secondary | ICD-10-CM

## 2015-07-12 DIAGNOSIS — M79641 Pain in right hand: Secondary | ICD-10-CM

## 2015-07-12 DIAGNOSIS — S61451A Open bite of right hand, initial encounter: Secondary | ICD-10-CM

## 2015-07-12 LAB — POCT CBC
GRANULOCYTE PERCENT: 78.2 % (ref 37–80)
HCT, POC: 37.7 % (ref 37.7–47.9)
HEMOGLOBIN: 13.1 g/dL (ref 12.2–16.2)
Lymph, poc: 1.3 (ref 0.6–3.4)
MCH: 31 pg (ref 27–31.2)
MCHC: 34.7 g/dL (ref 31.8–35.4)
MCV: 89.2 fL (ref 80–97)
MID (cbc): 0.6 (ref 0–0.9)
MPV: 6.9 fL (ref 0–99.8)
PLATELET COUNT, POC: 245 10*3/uL (ref 142–424)
POC Granulocyte: 6.7 (ref 2–6.9)
POC LYMPH PERCENT: 15.3 %L (ref 10–50)
POC MID %: 6.5 %M (ref 0–12)
RBC: 4.23 M/uL (ref 4.04–5.48)
RDW, POC: 13.7 %
WBC: 8.6 10*3/uL (ref 4.6–10.2)

## 2015-07-12 MED ORDER — DOXYCYCLINE HYCLATE 100 MG PO CAPS: 100.0000 mg | ORAL_CAPSULE | Freq: Two times a day (BID) | ORAL | Status: DC

## 2015-07-12 NOTE — Patient Instructions (Addendum)
You may use 400-600mg  of ibuprofen every 6-8 hours. Take this with food.   Animal Bite Animal bites can range from mild to serious. An animal bite can result in a scratch on the skin, a deep open cut, a puncture of the skin, a crush injury, or tearing away of the skin or a body part. A small bite from a house pet will usually not cause serious problems. However, some animal bites can become infected or injure a bone or other tissue.  Bites from certain animals can be more dangerous because of the risk of spreading rabies, which is a serious viral infection. This risk is higher with bites from stray animals or wild animals, such as raccoons, foxes, skunks, and bats. Dogs are responsible for most animal bites. Children are bitten more often than adults. SYMPTOMS  Common symptoms of an animal bite include:   Pain.   Bleeding.   Swelling.   Bruising.  DIAGNOSIS  This condition may be diagnosed based on a physical exam and medical history. Your health care provider will examine the wound and ask for details about the animal and how the bite happened. You may also have tests, such as:   Blood tests to check for infection or to determine if surgery is needed.  X-rays to check for damage to bones or joints.  Culture test. This uses a sample of fluid from the wound to check for infection. TREATMENT  Treatment varies depending on the location and type of animal bite and your medical history. Treatment may include:   Wound care. This often includes cleaning the wound, flushing the wound with saline solution, and applying a bandage (dressing). Sometimes, the wound is left open to heal because of the high risk of infection. However, in some cases, the wound may be closed with stitches (sutures), staples, skin glue, or adhesive strips.   Antibiotic medicine.   Tetanus shot.   Rabies treatment if the animal could have rabies.  In some cases, bites that have become infected may require IV  antibiotics and surgical treatment in the hospital.  [place]  Follow instructions from your health care provider about how to take care of your wound. Make sure you:  Wash your hands with soap and water before you change your dressing. If soap and water are not available, use hand sanitizer.  Change your dressing as told by your health care provider.  Leave sutures, skin glue, or adhesive strips in place. These skin closures may need to be in place for 2 weeks or longer. If adhesive strip edges start to loosen and curl up, you may trim the loose edges. Do not remove adhesive strips completely unless your health care provider tells you to do that.  Check your wound every day for signs of infection. Watch for:   Increasing redness, swelling, or pain.   Fluid, blood, or pus.  General Instructions  Take or apply over-the-counter and prescription medicines only as told by your health care provider.   If you were prescribed an antibiotic, take or apply it as told by your health care provider. Do not stop using the antibiotic even if your condition improves.   Keep the injured area raised (elevated) above the level of your heart while you are sitting or lying down, if this is possible.   If directed, apply ice to the injured area.   Put ice in a plastic bag.   Place a towel between your skin and the bag.  Leave the ice on for 20 minutes, 2-3 times per day.   Keep all follow-up visits as told by your health care provider. This is important.  SEEK MEDICAL CARE IF:  You have increasing redness, swelling, or pain at the site of your wound.   You have a general feeling of sickness (malaise).   You feel nauseous or you vomit.   You have pain that does not get better.  SEEK IMMEDIATE MEDICAL CARE IF:  You have a red streak extending away from your wound.   You have fluid, blood, or pus coming from your wound.   You have a fever or  chills.   You have trouble moving your injured area.   You have numbness or tingling extending beyond the wound.   This information is not intended to replace advice given to you by your health care provider. Make sure you discuss any questions you have with your health care provider.   Document Released: 10/06/2010 Document Revised: 10/09/2014 Document Reviewed: 06/05/2014 Elsevier Interactive Patient Education 2016 [employer].     IF you received an x-ray today, you will receive an invoice from [facility]. Please contact [facility] at [phone] with questions or concerns regarding your invoice.   IF you received labwork today, you will receive an invoice from [employer]. Please contact [facility] at [phone] with questions or concerns regarding your invoice.   Our billing staff will not be able to assist you with questions regarding bills from these companies.  You will be contacted with the lab results as soon as they are available. The fastest way to get your results is to activate your My Chart account. Instructions are located on the last page of this paperwork. If you have not heard from [place] regarding the results in 2 weeks, please contact this office.

## 2015-07-12 NOTE — Progress Notes (Signed)
MRN: [id number] DOB: [birth year]  Subjective:   Shelby Grant is a 60 y.o. female presenting for chief complaint of Animal Bite; thumb pain; and thumb swelling  Reports suffering a cat bite to her right hand last night. She has 2 puncture wounds over palm and dorsal side of right hand. She has since had worsening pain, redness, swelling, difficulty moving her right hand. Denies fever, streaking, numbness or tingling. She also has sore throat for the past 2-3 days. Denies cough, chest pain, shob, n/v, abdominal pain, sinus pain, ear pain.  Shelby Grant has a current medication list which includes the following prescription(s): bupropion, calcium-magnesium-vitamin d, clonazepam, metoprolol succinate, premarin, align, pyridoxine hcl, turmeric, and hydrocodone-acetaminophen. Also is allergic to latex and amoxicillin.  Shelby Grant  has a past medical history of ANXIETY (06/02/2007); Supraventricular tachycardia ([place]) (06/02/2007); ALLERGIC RHINITIS (07/03/2008); IRREGULAR MENSES (06/03/2008); CELLULITIS, LEFT LEG (09/14/2008); CORNS AND CALLUSES (07/03/2008); FOOT PAIN (09/22/2009); CHEST PAIN (07/07/2009); OCD (obsessive compulsive disorder); Depression; Arthritis; BELL'S PALSY, LEFT (12/02/2008); Neoplasm; Complication of anesthesia (08-28-2014); Stress fracture of ankle (since Jun 29, 2014); and Cancer ([place]). Also  has past surgical history that includes Wisdom tooth extraction; laparoscopic appendectomy (N/A, 08/28/2014); and Robotic assited partial nephrectomy (Left, 01/06/2015).  Objective:   Vitals: BP 100/84 mmHg  Pulse 77  Temp(Src) 98.7 F (37.1 C) (Oral)  Ht 5' 7.5" (1.715 m)  Wt 134 lb (60.782 kg)  BMI 20.67 kg/m2  SpO2 99%  LMP 07/10/2012  Physical Exam  Constitutional: She is oriented to person, place, and time. She appears well-developed and well-nourished.  HENT:  Mouth/Throat: Oropharynx is clear and moist.  Cardiovascular: Normal rate.   Pulmonary/Chest: Effort normal.  Musculoskeletal:   Right hand: She exhibits decreased range of motion (flexion of right hand, right thumb abduction, adduction and opposition), tenderness (over thenar eminence and puncture wounds) and swelling. She exhibits no bony tenderness, normal capillary refill and no laceration. Normal sensation noted. Decreased strength (secondary to pain) noted.  Refer to pictures for visualization of wounds.  Neurological: She is alert and oriented to person, place, and time.  Skin: Skin is warm and dry.       Dg Hand Complete Right  07/12/2015  CLINICAL DATA:  Cat bite in the right thumb.  Soft tissue swelling. EXAM: RIGHT HAND - COMPLETE 3+ VIEW COMPARISON:  None. FINDINGS: No fracture, dislocation, soft tissue gas, or bony erosion. IMPRESSION: Negative. Electronically Signed   By: Dorise Bullion III M.D   On: 07/12/2015 10:17    Results for orders placed or performed in visit on 07/12/15 (from the past 24 hour(s))  POCT CBC     Status: None   Collection Time: 07/12/15  9:24 AM  Result Value Ref Range   WBC 8.6 4.6 - 10.2 K/uL   Lymph, poc 1.3 0.6 - 3.4   POC LYMPH PERCENT 15.3 10 - 50 %L   MID (cbc) 0.6 0 - 0.9   POC MID % 6.5 0 - 12 %M   POC Granulocyte 6.7 2 - 6.9   Granulocyte percent 78.2 37 - 80 %G   RBC 4.23 4.04 - 5.48 M/uL   Hemoglobin 13.1 12.2 - 16.2 g/dL   HCT, POC 37.7 37.7 - 47.9 %   MCV 89.2 80 - 97 fL   MCH, POC 31.0 27 - 31.2 pg   MCHC 34.7 31.8 - 35.4 g/dL   RDW, POC 13.7 %   Platelet Count, POC 245 142 - 424 K/uL   MPV 6.9 0 -  99.8 fL   Assessment and Plan :   1. Cat bite of hand, right, initial encounter 2. Thumb pain, right 3. Right hand pain - Start doxycycline given allergies to amoxicillin. Hand placed in a splint. Counseled on worsening signs and symptoms of infection. She is to report to the ED if this happens before Monday. Otherwise, she will return to our clinic for recheck at that point with PA-Sarah.   Jaynee Eagles, PA-C [facility] [facility] [phone] 07/12/2015 9:01 AM

## 2015-07-14 ENCOUNTER — Ambulatory Visit (INDEPENDENT_AMBULATORY_CARE_PROVIDER_SITE_OTHER): Payer: BLUE CROSS/BLUE SHIELD | Admitting: Physician Assistant

## 2015-07-14 VITALS — BP 110/64 | HR 68 | Temp 98.0°F | Resp 18

## 2015-07-14 DIAGNOSIS — W5501XD Bitten by cat, subsequent encounter: Secondary | ICD-10-CM

## 2015-07-14 DIAGNOSIS — R21 Rash and other nonspecific skin eruption: Secondary | ICD-10-CM | POA: Diagnosis not present

## 2015-07-14 DIAGNOSIS — S61451D Open bite of right hand, subsequent encounter: Secondary | ICD-10-CM

## 2015-07-14 NOTE — Patient Instructions (Addendum)
  Recheck with Rosario Adie on Wednesday  Ok to take off the splint to shower and do warm compresses/soaks  Continue antibiotic   IF you received an x-ray today, you will receive an invoice from [facility]. Please contact [facility] at [phone] with questions or concerns regarding your invoice.   IF you received labwork today, you will receive an invoice from [employer]. Please contact [facility] at [phone] with questions or concerns regarding your invoice.   Our billing staff will not be able to assist you with questions regarding bills from these companies.  You will be contacted with the lab results as soon as they are available. The fastest way to get your results is to activate your My Chart account. Instructions are located on the last page of this paperwork. If you have not heard from [place] regarding the results in 2 weeks, please contact this office.

## 2015-07-14 NOTE — Progress Notes (Signed)
Shelby Grant  MRN: [id number] DOB: [birth year]  Subjective:  Pt presents to clinic for a recheck of her cat bite.  The pain is much better - she has worn the brace without problems.  Her stomach got a little upset from the abx this am but otherwise she has tolerated ok.  She has had no F/C.  Patient Active Problem List   Diagnosis Date Noted  . Renal neoplasm 01/06/2015  . Renal lesion 10/15/2014  . Bruising 07/08/2011  . Corn 05/11/2011  . Abdominal pain 05/11/2011  . Menopause syndrome 02/11/2011  . Cervical polyp 06/08/2010  . History of Bell's palsy 12/02/2008  . ALLERGIC RHINITIS 07/03/2008  . BREAST MASS, RIGHT 06/03/2008  . Supraventricular tachycardia ([place]) 06/02/2007  . Anxiety state 06/02/2007    Current Outpatient Prescriptions on File Prior to Visit  Medication Sig Dispense Refill  . buPROPion (WELLBUTRIN XL) 300 MG 24 hr tablet take 1 tablet by mouth once daily 100 tablet 3  . Calcium-Magnesium-Vitamin D (CALCIUM MAGNESIUM PO) Take 2 tablets by mouth daily.    . clonazePAM (KLONOPIN) 1 MG tablet Take 1 tablet (1 mg total) by mouth daily. (Patient taking differently: Take 1 mg by mouth at bedtime. ) 90 tablet 2  . doxycycline (VIBRAMYCIN) 100 MG capsule Take 1 capsule (100 mg total) by mouth 2 (two) times daily. 20 capsule 0  . metoprolol succinate (TOPROL-XL) 25 MG 24 hr tablet Take 0.5 tablets (12.5 mg total) by mouth 2 (two) times daily. (Patient taking differently: Take 12.5 mg by mouth at bedtime. Patient takes only at hs) 100 tablet 3  . PREMARIN vaginal cream Place 1 Applicatorful vaginally See admin instructions. Use twice a week on Wednesday and Sunday  0  . Probiotic Product (ALIGN) 4 MG CAPS Take 1 capsule by mouth daily.    . Pyridoxine HCl (VITAMIN B-6 PO) Take 1 tablet by mouth daily.    . TURMERIC PO Take 1 tablet by mouth daily.    . [DISCONTINUED] estrogen, conjugated,-medroxyprogesterone (PREMPRO) 0.625-2.5 MG per tablet Take 1 tablet by mouth daily.  28 tablet 11   No current facility-administered medications on file prior to visit.    Allergies  Allergen Reactions  . Latex     REACTION: rash  . Amoxicillin Rash    Has patient had a PCN reaction causing immediate rash, facial/tongue/throat swelling, SOB or lightheadedness with hypotension: unkown Has patient had a PCN reaction causing severe rash involving mucus membranes or skin necrosis: no Has patient had a PCN reaction that required hospitalization No Has patient had a PCN reaction occurring within the last 10 years: unknown If all of the above answers are "NO", then may proceed with Cephalosporin use.    Review of Systems  Constitutional: Negative for fever and chills.   Objective:  BP 110/64 mmHg  Pulse 68  Temp(Src) 98 F (36.7 C) (Oral)  Resp 18  SpO2 96%  LMP 07/10/2012  Physical Exam  Constitutional: She is oriented to person, place, and time and well-developed, well-nourished, and in no distress.  HENT:  Head: Normocephalic and atraumatic.  Right Ear: Hearing and external ear normal.  Left Ear: Hearing and external ear normal.  Eyes: Conjunctivae are normal.  Neck: Normal range of motion.  Pulmonary/Chest: Effort normal.  Neurological: She is alert and oriented to person, place, and time. Gait normal.  Skin: Skin is warm and dry.  Right thenar eminence - pustule - opened and purulence expressed and sent for culture - there  is some mild erythema around the puncture/pustule area but it is more distal to the wound - there is some TTP around the puncture site - she has FROM with minimal discomfort that is not increased with resistance testing - swelling seems improved from media photo - some pain with extension of thumb but per patient it is improved  Psychiatric: Mood, memory, affect and judgment normal.  Vitals reviewed.   Assessment and Plan :  Cat bite, subsequent encounter - Plan: WOUND CULTURE  Continue abx - continue splint but can take off the shower  and do warm compresses - she is improved per his history as well as seems improved in the PE - she will f/u in 48h unless she gets worse tomorrow and then she will RTC tomorrow for recheck.  Windell Hummingbird PA-C  [facility] 07/14/2015 12:30 PM

## 2015-07-16 ENCOUNTER — Ambulatory Visit (INDEPENDENT_AMBULATORY_CARE_PROVIDER_SITE_OTHER): Payer: BLUE CROSS/BLUE SHIELD | Admitting: Physician Assistant

## 2015-07-16 VITALS — BP 118/74 | HR 77 | Temp 97.6°F | Resp 15 | Ht 67.5 in | Wt 134.0 lb

## 2015-07-16 DIAGNOSIS — L298 Other pruritus: Secondary | ICD-10-CM | POA: Diagnosis not present

## 2015-07-16 DIAGNOSIS — S61451D Open bite of right hand, subsequent encounter: Secondary | ICD-10-CM | POA: Diagnosis not present

## 2015-07-16 DIAGNOSIS — W5501XD Bitten by cat, subsequent encounter: Secondary | ICD-10-CM

## 2015-07-16 NOTE — Progress Notes (Signed)
07/16/2015 2:43 PM   DOB: 01/07/1956 / MRN: [id number]  SUBJECTIVE:  Shelby Grant is a 60 y.o. female presenting for a recheck of a cat bite.  She has been seen three times now and she continue to improve.  Today she reports she is largely pain free.  States that the purulence stopped after seeing Ms. Weber 2 days ago.  Cultures have been negative thus far.    She also complains of some "bug bites" of her posterior left thigh that [place] the [place] 1 [place].  She has a rash now and reports this is itchy.  Denies tenderness, purulence, and scaling of the rash.   She is allergic to latex and amoxicillin.   She  has a past medical history of ANXIETY (06/02/2007); Supraventricular tachycardia ([place]) (06/02/2007); ALLERGIC RHINITIS (07/03/2008); IRREGULAR MENSES (06/03/2008); CELLULITIS, LEFT LEG (09/14/2008); CORNS AND CALLUSES (07/03/2008); FOOT PAIN (09/22/2009); CHEST PAIN (07/07/2009); OCD (obsessive compulsive disorder); Depression; Arthritis; BELL'S PALSY, LEFT (12/02/2008); Neoplasm; Complication of anesthesia (08-28-2014); Stress fracture of ankle (since Jun 29, 2014); and Cancer ([place]).    She  reports that she has never smoked. She has never used smokeless tobacco. She reports that she drinks alcohol. She reports that she does not use illicit drugs. She  reports that she currently engages in sexual activity. The patient  has past surgical history that includes Wisdom tooth extraction; laparoscopic appendectomy (N/A, 08/28/2014); and Robotic assited partial nephrectomy (Left, 01/06/2015).  Her family history includes Asthma in her sister; Cancer in her mother.  Review of Systems  Constitutional: Negative for fever.  Musculoskeletal: Positive for myalgias (improving). Negative for joint pain.  Skin: Positive for rash.  Neurological: Negative for dizziness.    Problem list and medications reviewed and updated by myself where necessary, and exist elsewhere in the encounter.   OBJECTIVE:  BP  118/74 mmHg  Pulse 77  Temp(Src) 97.6 F (36.4 C) (Oral)  Resp 15  Ht 5' 7.5" (1.715 m)  Wt 134 lb (60.782 kg)  BMI 20.67 kg/m2  SpO2 96%  LMP 07/10/2012  Physical Exam  Constitutional: She is oriented to person, place, and time. She appears well-developed and well-nourished.  HENT:  Mouth/Throat: Oropharynx is clear and moist.  Cardiovascular: Normal rate.   Pulmonary/Chest: Effort normal.  Musculoskeletal:       Right hand: She exhibits tenderness (thenar eminence, minimal). She exhibits no bony tenderness, normal capillary refill, no laceration and no swelling. Normal sensation noted. Normal strength noted.       Legs: Refer to pictures for visualization of wounds.  Neurological: She is alert and oriented to person, place, and time.  Skin: Skin is warm and dry.    Results for orders placed or performed in visit on 07/14/15 (from the past 72 hour(s))  WOUND CULTURE     Status: None (Preliminary result)   Collection Time: 07/14/15 12:12 PM  Result Value Ref Range   Gram Stain No WBC Seen    Gram Stain Few Squamous Epithelial Cells Present    Gram Stain No Organisms Seen    Gram Stain      Preliminary Report Culture reincubated for better growth          No results found.  ASSESSMENT AND PLAN  Chamere was seen today for follow-up.  Diagnoses and all orders for this visit:  Cat bite, subsequent encounter:  Infection appears to have resolved.  ROM and strength normal.  Advised she continue the splint for the next  two days and then remove.  Follow up here as needed.  Continue doxy.   Xerotic eczema: OTC hydrocortisone ointment.    The patient was advised to call or return to clinic if she does not see an improvement in symptoms or to seek the care of the closest emergency department if she worsens with the above plan.   Philis Fendt, MHS, PA-C [facility] 07/16/2015 2:43 PM

## 2015-07-16 NOTE — Patient Instructions (Addendum)
Purchase 1% hydrocortisone ointment and apply twice daily.  The itching should be better in 24-48 hours and the rash on your leg should be gone in roughly 1 week.     IF you received an x-ray today, you will receive an invoice from [facility]. Please contact [facility] at [phone] with questions or concerns regarding your invoice.   IF you received labwork today, you will receive an invoice from [employer]. Please contact [facility] at [phone] with questions or concerns regarding your invoice.   Our billing staff will not be able to assist you with questions regarding bills from these companies.  You will be contacted with the lab results as soon as they are available. The fastest way to get your results is to activate your My Chart account. Instructions are located on the last page of this paperwork. If you have not heard from [place] regarding the results in 2 weeks, please contact this office.

## 2015-07-17 LAB — WOUND CULTURE
GRAM STAIN: NONE SEEN
Gram Stain: NONE SEEN

## 2015-07-30 ENCOUNTER — Encounter: Payer: Self-pay | Admitting: Urgent Care

## 2015-07-30 ENCOUNTER — Ambulatory Visit (INDEPENDENT_AMBULATORY_CARE_PROVIDER_SITE_OTHER): Payer: BLUE CROSS/BLUE SHIELD | Admitting: Urgent Care

## 2015-07-30 ENCOUNTER — Telehealth: Payer: Self-pay | Admitting: *Deleted

## 2015-07-30 VITALS — BP 118/80 | HR 92 | Temp 98.1°F | Resp 18 | Ht 67.5 in | Wt 132.0 lb

## 2015-07-30 DIAGNOSIS — L03113 Cellulitis of right upper limb: Secondary | ICD-10-CM | POA: Diagnosis not present

## 2015-07-30 DIAGNOSIS — R21 Rash and other nonspecific skin eruption: Secondary | ICD-10-CM

## 2015-07-30 MED ORDER — TRIAMCINOLONE ACETONIDE 0.1 % EX CREA: 1.0000 "application " | TOPICAL_CREAM | Freq: Two times a day (BID) | CUTANEOUS | Status: DC

## 2015-07-30 NOTE — Telephone Encounter (Signed)
Patient called stating she was in on June 14 for follow up cat bite.  Was told if anything changed to call.  She is having some redness and bump appear on her palm were the bite  Was.

## 2015-07-30 NOTE — Patient Instructions (Addendum)
Animal Bite Animal bites can range from mild to serious. An animal bite can result in a scratch on the skin, a deep open cut, a puncture of the skin, a crush injury, or tearing away of the skin or a body part. A small bite from a house pet will usually not cause serious problems. However, some animal bites can become infected or injure a bone or other tissue.  Bites from certain animals can be more dangerous because of the risk of spreading rabies, which is a serious viral infection. This risk is higher with bites from stray animals or wild animals, such as raccoons, foxes, skunks, and bats. Dogs are responsible for most animal bites. Children are bitten more often than adults. SYMPTOMS  Common symptoms of an animal bite include:   Pain.   Bleeding.   Swelling.   Bruising.  DIAGNOSIS  This condition may be diagnosed based on a physical exam and medical history. Your health care provider will examine the wound and ask for details about the animal and how the bite happened. You may also have tests, such as:   Blood tests to check for infection or to determine if surgery is needed.  X-rays to check for damage to bones or joints.  Culture test. This uses a sample of fluid from the wound to check for infection. TREATMENT  Treatment varies depending on the location and type of animal bite and your medical history. Treatment may include:   Wound care. This often includes cleaning the wound, flushing the wound with saline solution, and applying a bandage (dressing). Sometimes, the wound is left open to heal because of the high risk of infection. However, in some cases, the wound may be closed with stitches (sutures), staples, skin glue, or adhesive strips.   Antibiotic medicine.   Tetanus shot.   Rabies treatment if the animal could have rabies.  In some cases, bites that have become infected may require IV antibiotics and surgical treatment in the hospital.  HOME CARE  INSTRUCTIONS Wound Care  Follow instructions from your health care provider about how to take care of your wound. Make sure you:  Wash your hands with soap and water before you change your dressing. If soap and water are not available, use hand sanitizer.  Change your dressing as told by your health care provider.  Leave sutures, skin glue, or adhesive strips in place. These skin closures may need to be in place for 2 weeks or longer. If adhesive strip edges start to loosen and curl up, you may trim the loose edges. Do not remove adhesive strips completely unless your health care provider tells you to do that.  Check your wound every day for signs of infection. Watch for:   Increasing redness, swelling, or pain.   Fluid, blood, or pus.  General Instructions  Take or apply over-the-counter and prescription medicines only as told by your health care provider.   If you were prescribed an antibiotic, take or apply it as told by your health care provider. Do not stop using the antibiotic even if your condition improves.   Keep the injured area raised (elevated) above the level of your heart while you are sitting or lying down, if this is possible.   If directed, apply ice to the injured area.   Put ice in a plastic bag.   Place a towel between your skin and the bag.   Leave the ice on for 20 minutes, 2-3 times per day.     Keep all follow-up visits as told by your health care provider. This is important.  SEEK MEDICAL CARE IF:  You have increasing redness, swelling, or pain at the site of your wound.   You have a general feeling of sickness (malaise).   You feel nauseous or you vomit.   You have pain that does not get better.  SEEK IMMEDIATE MEDICAL CARE IF:  You have a red streak extending away from your wound.   You have fluid, blood, or pus coming from your wound.   You have a fever or chills.   You have trouble moving your injured area.   You  have numbness or tingling extending beyond the wound.   This information is not intended to replace advice given to you by your health care provider. Make sure you discuss any questions you have with your health care provider.   Document Released: 10/06/2010 Document Revised: 10/09/2014 Document Reviewed: 06/05/2014 Elsevier Interactive Patient Education 2016 [employer].     IF you received an x-ray today, you will receive an invoice from [facility]. Please contact [facility] at [phone] with questions or concerns regarding your invoice.   IF you received labwork today, you will receive an invoice from [employer]. Please contact [facility] at [phone] with questions or concerns regarding your invoice.   Our billing staff will not be able to assist you with questions regarding bills from these companies.  You will be contacted with the lab results as soon as they are available. The fastest way to get your results is to activate your My Chart account. Instructions are located on the last page of this paperwork. If you have not heard from [place] regarding the results in 2 weeks, please contact this office.

## 2015-07-30 NOTE — Telephone Encounter (Signed)
Advised patient to return to the clinic for re-evaluation.

## 2015-08-01 ENCOUNTER — Ambulatory Visit (INDEPENDENT_AMBULATORY_CARE_PROVIDER_SITE_OTHER): Payer: BLUE CROSS/BLUE SHIELD | Admitting: Physician Assistant

## 2015-08-01 VITALS — BP 118/78 | HR 75 | Temp 98.7°F | Resp 16 | Ht 67.5 in | Wt 133.0 lb

## 2015-08-01 DIAGNOSIS — L03113 Cellulitis of right upper limb: Secondary | ICD-10-CM | POA: Diagnosis not present

## 2015-08-01 MED ORDER — LEVOFLOXACIN 500 MG PO TABS: 500.0000 mg | ORAL_TABLET | Freq: Every day | ORAL | Status: DC

## 2015-08-01 NOTE — Progress Notes (Signed)
08/01/2015 12:47 PM   DOB: Jun 27, 1955 / MRN: [id number]  SUBJECTIVE:  Shelby Grant is a 60 y.o. female presenting for a recheck of her hand wound sustained after being an animal on 6/10.  She was placed on doxycycline and I saw her in follow up on 07/16/15 and felt the wound looks so well healed there was no need for further follow up.  She presented to [facility]28 due to swelling about the area and serosanguinous fluid was drained and culture is showing no growth thus far.  She feels well otherwise, however continues to complain of mild pain about the thenar eminence.    She is allergic to latex and amoxicillin.   She  has a past medical history of ANXIETY (06/02/2007); Supraventricular tachycardia ([place]) (06/02/2007); ALLERGIC RHINITIS (07/03/2008); IRREGULAR MENSES (06/03/2008); CELLULITIS, LEFT LEG (09/14/2008); CORNS AND CALLUSES (07/03/2008); FOOT PAIN (09/22/2009); CHEST PAIN (07/07/2009); OCD (obsessive compulsive disorder); Depression; Arthritis; BELL'S PALSY, LEFT (12/02/2008); Neoplasm; Complication of anesthesia (08-28-2014); Stress fracture of ankle (since Jun 29, 2014); and Cancer ([place]).    She  reports that she has never smoked. She has never used smokeless tobacco. She reports that she drinks alcohol. She reports that she does not use illicit drugs. She  reports that she currently engages in sexual activity. The patient  has past surgical history that includes Wisdom tooth extraction; laparoscopic appendectomy (N/A, 08/28/2014); and Robotic assited partial nephrectomy (Left, 01/06/2015).  Her family history includes Asthma in her sister; Cancer in her mother.  Review of Systems  Constitutional: Negative for fever and chills.  Gastrointestinal: Negative for nausea.  Skin: Positive for rash. Negative for itching.  Neurological: Negative for dizziness.    Problem list and medications reviewed and updated by myself where necessary, and exist elsewhere in the encounter.   OBJECTIVE:  BP 118/78  mmHg  Pulse 75  Temp(Src) 98.7 F (37.1 C) (Oral)  Resp 16  Ht 5' 7.5" (1.715 m)  Wt 133 lb (60.328 kg)  BMI 20.51 kg/m2  SpO2 97%  LMP 07/10/2012  Physical Exam  Constitutional: She is oriented to person, place, and time. She appears well-nourished. No distress.  Eyes: EOM are normal. Pupils are equal, round, and reactive to light.  Cardiovascular: Normal rate.   Pulmonary/Chest: Effort normal.  Abdominal: She exhibits no distension.  Neurological: She is alert and oriented to person, place, and time. No cranial nerve deficit. Gait normal.  Skin: Skin is dry. She is not diaphoretic.  Psychiatric: She has a normal mood and affect.  Vitals reviewed.     Results for orders placed or performed in visit on 07/30/15 (from the past 72 hour(s))  WOUND CULTURE     Status: None (Preliminary result)   Collection Time: 07/30/15  3:26 PM  Result Value Ref Range   Gram Stain No WBC Seen    Gram Stain No Squamous Epithelial Cells Seen    Gram Stain No Organisms Seen    Preliminary Report NO GROWTH 1 DAY     No results found.  ASSESSMENT AND PLAN  Shelby Grant was seen today for wound check.  Diagnoses and all orders for this visit:  Cellulitis of right hand: I am concerned as she is not following the normal course.  She has an allergy to beta lactams and has completed doxy.  Will write for levo and advised that she fill if her hand is not improving. She needs to see hand.  I have placed the referral.   -  Ambulatory referral to Hand Surgery  Other orders -     levofloxacin (LEVAQUIN) 500 MG tablet; Take 1 tablet (500 mg total) by mouth daily. Fill only if you hand worsens before getting into a hand doctor.    The patient was advised to call or return to clinic if she does not see an improvement in symptoms or to seek the care of the closest emergency department if she worsens with the above plan.   Philis Fendt, MHS, PA-C [facility] 08/01/2015 12:47 PM

## 2015-08-01 NOTE — Patient Instructions (Addendum)
If you get worse before seeing a hand doctor then start taking Levaquin.     IF you received an x-ray today, you will receive an invoice from [facility]. Please contact [facility] at [phone] with questions or concerns regarding your invoice.   IF you received labwork today, you will receive an invoice from [employer]. Please contact [facility] at [phone] with questions or concerns regarding your invoice.   Our billing staff will not be able to assist you with questions regarding bills from these companies.  You will be contacted with the lab results as soon as they are available. The fastest way to get your results is to activate your My Chart account. Instructions are located on the last page of this paperwork. If you have not heard from [place] regarding the results in 2 weeks, please contact this office.

## 2015-08-02 LAB — WOUND CULTURE
GRAM STAIN: NONE SEEN
GRAM STAIN: NONE SEEN
Gram Stain: NONE SEEN
Organism ID, Bacteria: NO GROWTH

## 2015-08-04 DIAGNOSIS — S61051A Open bite of right thumb without damage to nail, initial encounter: Secondary | ICD-10-CM | POA: Diagnosis not present

## 2015-08-07 NOTE — Progress Notes (Signed)
    MRN: [id number] DOB: 09/12/1955  Subjective:   Shelby Grant is a 60 y.o. female presenting for follow up on cat bite and rash.   Cat bite - Patient was initially seen at our clinic on 07/12/2015, treated for cellulitis from cat bite. She had additional f/u visits and did very well. Today, she reports that she had some redness come back over the puncture wound of the cat bite and sees a "white spot". Denies fever, streaking, pain, swelling.  Rash - Reports an itchy rash over the back of her left thigh. She has used some otc cortisone with some improvement but has not achieved resolution. Denies tick bites, fever, cough, headache.  Shelby Grant has a current medication list which includes the following prescription(s): bupropion, calcium-magnesium-vitamin d, clonazepam, levofloxacin, metoprolol succinate, premarin, align, pyridoxine hcl, triamcinolone cream, and turmeric. Also is allergic to latex and amoxicillin.  Shelby Grant  has a past medical history of ANXIETY (06/02/2007); Supraventricular tachycardia ([place]) (06/02/2007); ALLERGIC RHINITIS (07/03/2008); IRREGULAR MENSES (06/03/2008); CELLULITIS, LEFT LEG (09/14/2008); CORNS AND CALLUSES (07/03/2008); FOOT PAIN (09/22/2009); CHEST PAIN (07/07/2009); OCD (obsessive compulsive disorder); Depression; Arthritis; BELL'S PALSY, LEFT (12/02/2008); Neoplasm; Complication of anesthesia (08-28-2014); Stress fracture of ankle (since Jun 29, 2014); and Cancer ([place]). Also  has past surgical history that includes Wisdom tooth extraction; laparoscopic appendectomy (N/A, 08/28/2014); and Robotic assited partial nephrectomy (Left, 01/06/2015).  Objective:   Vitals: BP 118/80 mmHg  Pulse 92  Temp(Src) 98.1 F (36.7 C) (Oral)  Resp 18  Ht 5' 7.5" (1.715 m)  Wt 132 lb (59.875 kg)  BMI 20.36 kg/m2  SpO2 98%  LMP 07/10/2012  Physical Exam  Constitutional: She is oriented to person, place, and time. She appears well-developed and well-nourished.  Cardiovascular: Normal rate.     Pulmonary/Chest: Effort normal.  Musculoskeletal:       Hands: Neurological: She is alert and oriented to person, place, and time.  Skin: Skin is warm and dry.    PROCEDURE NOTE: I&D Verbal consent obtained. Local anesthesia with 0.5cc of 2% lidocaine. Site cleansed with alcohol prep pad.  Area was punctured using an 18 gauge, there was slight discharge of copious serosanguinous fluid. Cleansed and dressed.  Assessment and Plan :   1. Cellulitis of right hand - Wound culture pending - After care instructions provided. Patient to return to clinic in 2 days for reevaluation.  2. Rash and nonspecific skin eruption - Start Kenalog, rtc in 1 week if no improvement.  Jaynee Eagles, PA-C [facility] [phone] 08/07/2015 11:38 PM

## 2015-08-11 ENCOUNTER — Other Ambulatory Visit (HOSPITAL_COMMUNITY): Payer: Self-pay | Admitting: Urology

## 2015-08-11 ENCOUNTER — Ambulatory Visit (HOSPITAL_COMMUNITY)
Admission: RE | Admit: 2015-08-11 | Discharge: 2015-08-11 | Disposition: A | Discharge status: Home / Self Care | Payer: BLUE CROSS/BLUE SHIELD | Source: Ambulatory Visit | Attending: Urology | Admitting: Urology

## 2015-08-11 DIAGNOSIS — C642 Malignant neoplasm of left kidney, except renal pelvis: Secondary | ICD-10-CM | POA: Insufficient documentation

## 2015-08-11 DIAGNOSIS — R938 Abnormal findings on diagnostic imaging of other specified body structures: Secondary | ICD-10-CM | POA: Insufficient documentation

## 2015-08-11 DIAGNOSIS — Z85528 Personal history of other malignant neoplasm of kidney: Secondary | ICD-10-CM | POA: Diagnosis not present

## 2015-08-11 DIAGNOSIS — B0052 Herpesviral keratitis: Secondary | ICD-10-CM | POA: Diagnosis not present

## 2015-08-12 DIAGNOSIS — S61051A Open bite of right thumb without damage to nail, initial encounter: Secondary | ICD-10-CM | POA: Diagnosis not present

## 2015-08-15 DIAGNOSIS — Z85528 Personal history of other malignant neoplasm of kidney: Secondary | ICD-10-CM | POA: Diagnosis not present

## 2015-09-08 ENCOUNTER — Telehealth: Payer: Self-pay | Admitting: Family Medicine

## 2015-09-08 NOTE — Telephone Encounter (Signed)
Pt would like to know if on the first visit you can give her a CPE or how would you bill the 1st visit for insurance purposes?  Pt would like to transfer from Dr. Sherren Mocha to you.

## 2015-09-08 NOTE — Telephone Encounter (Signed)
Please see below message and can schedule patient. Thank you!

## 2015-09-08 NOTE — Telephone Encounter (Signed)
If she has no significant concerns I can certainly do a CPE first visit.  Thanks, BJ

## 2015-09-08 NOTE — Telephone Encounter (Signed)
Okay to do cpe on her first visit with you?

## 2015-09-09 NOTE — Telephone Encounter (Signed)
Pt scheduled  

## 2015-09-13 ENCOUNTER — Encounter: Payer: Self-pay | Admitting: Internal Medicine

## 2015-09-13 ENCOUNTER — Ambulatory Visit (INDEPENDENT_AMBULATORY_CARE_PROVIDER_SITE_OTHER): Payer: BLUE CROSS/BLUE SHIELD | Admitting: Internal Medicine

## 2015-09-13 DIAGNOSIS — N3000 Acute cystitis without hematuria: Secondary | ICD-10-CM | POA: Diagnosis not present

## 2015-09-13 DIAGNOSIS — Z23 Encounter for immunization: Secondary | ICD-10-CM | POA: Diagnosis not present

## 2015-09-13 LAB — POC URINALSYSI DIPSTICK (AUTOMATED)
Bilirubin, UA: NEGATIVE
Blood, UA: NEGATIVE
Glucose, UA: NEGATIVE
Ketones, UA: NEGATIVE
NITRITE UA: NEGATIVE
PROTEIN UA: NEGATIVE
Spec Grav, UA: 1.01
Urobilinogen, UA: NEGATIVE
pH, UA: 7

## 2015-09-13 MED ORDER — CIPROFLOXACIN HCL 250 MG PO TABS: 250.0000 mg | ORAL_TABLET | Freq: Two times a day (BID) | ORAL | 0 refills | Status: DC

## 2015-09-13 NOTE — Assessment & Plan Note (Signed)
Last treated in 2015--cipro worked May only need 3 days Rx [place] of fluids, etc

## 2015-09-13 NOTE — Patient Instructions (Signed)
Start the cipro today and take 2 doses. If your symptoms are gone by tomorrow, you only need it for 3 days.

## 2015-09-13 NOTE — Progress Notes (Signed)
Subjective:    Patient ID: Shelby Grant, female    DOB: 1956-01-06, 60 y.o.   MRN: [id number]  HPI Here due to urinary symptoms  Noticed some early symptoms about a week ago Pain with voiding--especially at end of stream Increased frequency Has some suprapubic discomfort and bloating  Intermittent urinary infections--like once a year cipro has helped  No fever Did try pyridium last night--- not clearly helpful  Current Outpatient Prescriptions on File Prior to Visit  Medication Sig Dispense Refill  . buPROPion (WELLBUTRIN XL) 300 MG 24 hr tablet take 1 tablet by mouth once daily 100 tablet 3  . Calcium-Magnesium-Vitamin D (CALCIUM MAGNESIUM PO) Take 2 tablets by mouth daily.    . clonazePAM (KLONOPIN) 1 MG tablet Take 1 tablet (1 mg total) by mouth daily. (Patient taking differently: Take 1 mg by mouth at bedtime. ) 90 tablet 2  . metoprolol succinate (TOPROL-XL) 25 MG 24 hr tablet Take 0.5 tablets (12.5 mg total) by mouth 2 (two) times daily. (Patient taking differently: Take 12.5 mg by mouth at bedtime. Patient takes only at hs) 100 tablet 3  . PREMARIN vaginal cream Place 1 Applicatorful vaginally See admin instructions. Use twice a week on Wednesday and Sunday  0  . Probiotic Product (ALIGN) 4 MG CAPS Take 1 capsule by mouth daily.    . Pyridoxine HCl (VITAMIN B-6 PO) Take 1 tablet by mouth daily.    Marland Kitchen triamcinolone cream (KENALOG) 0.1 % Apply 1 application topically 2 (two) times daily. 30 g 0  . TURMERIC PO Take 1 tablet by mouth daily.    . [DISCONTINUED] estrogen, conjugated,-medroxyprogesterone (PREMPRO) 0.625-2.5 MG per tablet Take 1 tablet by mouth daily. 28 tablet 11   No current facility-administered medications on file prior to visit.     Allergies  Allergen Reactions  . Latex     REACTION: rash  . Amoxicillin Rash    Has patient had a PCN reaction causing immediate rash, facial/tongue/throat swelling, SOB or lightheadedness with hypotension: unkown Has  patient had a PCN reaction causing severe rash involving mucus membranes or skin necrosis: no Has patient had a PCN reaction that required hospitalization No Has patient had a PCN reaction occurring within the last 10 years: unknown If all of the above answers are "NO", then may proceed with Cephalosporin use.    Past Medical History:  Diagnosis Date  . ALLERGIC RHINITIS 07/03/2008  . ANXIETY 06/02/2007  . Arthritis   . BELL'S PALSY, LEFT 12/02/2008  . Cancer ([place])   . CELLULITIS, LEFT LEG 09/14/2008  . CHEST PAIN 07/07/2009  . Complication of anesthesia 08-28-2014   "work up in recovery room with jaw locked open"  . CORNS AND CALLUSES 07/03/2008  . Depression   . FOOT PAIN 09/22/2009  . IRREGULAR MENSES 06/03/2008  . Neoplasm    left renal  . OCD (obsessive compulsive disorder)   . Stress fracture of ankle since Jun 29, 2014   left  . Supraventricular tachycardia ([place]) 06/02/2007   Atrial tachycardia with Wenckebach periodicity    Past Surgical History:  Procedure Laterality Date  . LAPAROSCOPIC APPENDECTOMY N/A 08/28/2014   Procedure: APPENDECTOMY LAPAROSCOPIC;  Surgeon: Judeth Horn, MD;  Location: [facility];  Service: General;  Laterality: N/A;  . ROBOTIC ASSITED PARTIAL NEPHRECTOMY Left 01/06/2015   Procedure: ROBOTIC ASSISTED PARTIAL NEPHRECTOMY;  Surgeon: Raynelle Bring, MD;  Location: [facility];  Service: Urology;  Laterality: Left;  . WISDOM TOOTH EXTRACTION      Family  History  Problem Relation Age of Onset  . Asthma Sister   . Cancer Mother     Marena Chancy where cancer started     Social History   Social History  . Marital status: Married    Spouse name: N/A  . Number of children: N/A  . Years of education: N/A   Occupational History  . 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 Smoking status: Never Smoker  . Smokeless tobacco: Never Used  . Alcohol use Yes     Comment: social  . Drug use: No  . Sexual activity: Yes     Comment: married   Other  Topics Concern  . Not on file   Social History Narrative   No caffeine    Review of Systems Some headache today No N/V Appetite is okay No back pain    Objective:   Physical Exam  Constitutional: She appears well-developed and well-nourished. No distress.  Abdominal: Soft. She exhibits no distension. There is no rebound and no guarding.  Mild suprapubic tenderness  Musculoskeletal:  No CVA tenderness          Assessment & Plan:

## 2015-09-13 NOTE — Addendum Note (Signed)
Addended by: Lurlean Nanny on: 09/13/2015 12:09 PM   Modules accepted: Orders

## 2015-09-22 ENCOUNTER — Encounter: Payer: Self-pay | Admitting: Emergency Medicine

## 2015-10-07 DIAGNOSIS — S93401A Sprain of unspecified ligament of right ankle, initial encounter: Secondary | ICD-10-CM | POA: Diagnosis not present

## 2015-10-08 ENCOUNTER — Ambulatory Visit: Payer: BLUE CROSS/BLUE SHIELD

## 2015-10-09 ENCOUNTER — Ambulatory Visit: Payer: BLUE CROSS/BLUE SHIELD | Admitting: Family Medicine

## 2015-10-30 ENCOUNTER — Other Ambulatory Visit: Payer: Self-pay | Admitting: Family Medicine

## 2015-11-21 ENCOUNTER — Telehealth: Payer: Self-pay | Admitting: Family Medicine

## 2015-11-21 DIAGNOSIS — N390 Urinary tract infection, site not specified: Secondary | ICD-10-CM | POA: Diagnosis not present

## 2015-11-21 NOTE — Telephone Encounter (Signed)
Patient Name: Shelby Grant DOB: December 27, 1955 Initial Comment Caller states feels like she has a UTI, out of town. asking for rx Nurse Assessment Nurse: Ronnald Ramp, RN, Miranda Date/Time Eilene Ghazi Time): 11/21/2015 2:00:48 PM Confirm and document reason for call. If symptomatic, describe symptoms. You must click the next button to save text entered. ---Caller states she is out of town and having urinary urgency/ frequency. Also with pressure and tenderness in her lower abdomen. Has the patient traveled out of the country within the last 30 days? ---Not Applicable Does the patient have any new or worsening symptoms? ---Yes Will a triage be completed? ---Yes Related visit to physician within the last 2 weeks? ---No Does the PT have any chronic conditions? (i.e. diabetes, asthma, etc.) ---Yes List chronic conditions. ---SVT, Anxiety. Is this a behavioral health or substance abuse call? ---No Guidelines Guideline Title Affirmed Question Affirmed Notes Urinary Symptoms Urinating more frequently than usual (i.e., frequency) Final Disposition User See Physician within 24 Hours Jones, RN, Miranda Comments Caller is out of state and recommended she go to [facility] where she is. Referrals GO TO FACILITY UNDECIDED Disagree/Comply: Comply

## 2015-11-27 ENCOUNTER — Other Ambulatory Visit (INDEPENDENT_AMBULATORY_CARE_PROVIDER_SITE_OTHER): Payer: BLUE CROSS/BLUE SHIELD

## 2015-11-27 DIAGNOSIS — Z0000 Encounter for general adult medical examination without abnormal findings: Secondary | ICD-10-CM | POA: Diagnosis not present

## 2015-11-27 LAB — CBC WITH DIFFERENTIAL/PLATELET
BASOS PCT: 0.6 % (ref 0.0–3.0)
Basophils Absolute: 0 10*3/uL (ref 0.0–0.1)
EOS ABS: 0.2 10*3/uL (ref 0.0–0.7)
EOS PCT: 4.3 % (ref 0.0–5.0)
HCT: 39.7 % (ref 36.0–46.0)
HEMOGLOBIN: 13.6 g/dL (ref 12.0–15.0)
LYMPHS ABS: 1.9 10*3/uL (ref 0.7–4.0)
Lymphocytes Relative: 34 % (ref 12.0–46.0)
MCHC: 34.2 g/dL (ref 30.0–36.0)
MCV: 89.6 fl (ref 78.0–100.0)
MONO ABS: 0.6 10*3/uL (ref 0.1–1.0)
Monocytes Relative: 10 % (ref 3.0–12.0)
NEUTROS ABS: 2.9 10*3/uL (ref 1.4–7.7)
NEUTROS PCT: 51.1 % (ref 43.0–77.0)
PLATELETS: 261 10*3/uL (ref 150.0–400.0)
RBC: 4.43 Mil/uL (ref 3.87–5.11)
RDW: 13.5 % (ref 11.5–15.5)
WBC: 5.7 10*3/uL (ref 4.0–10.5)

## 2015-11-27 LAB — TSH: TSH: 1.9 u[IU]/mL (ref 0.35–4.50)

## 2015-11-27 LAB — POC URINALSYSI DIPSTICK (AUTOMATED)
BILIRUBIN UA: NEGATIVE
Blood, UA: NEGATIVE
GLUCOSE UA: NEGATIVE
Ketones, UA: NEGATIVE
LEUKOCYTES UA: NEGATIVE
NITRITE UA: NEGATIVE
Protein, UA: NEGATIVE
Spec Grav, UA: 1.02
UROBILINOGEN UA: 0.2
pH, UA: 7

## 2015-11-27 LAB — HEPATIC FUNCTION PANEL
ALBUMIN: 4.5 g/dL (ref 3.5–5.2)
ALK PHOS: 59 U/L (ref 39–117)
ALT: 19 U/L (ref 0–35)
AST: 19 U/L (ref 0–37)
Bilirubin, Direct: 0.1 mg/dL (ref 0.0–0.3)
Total Bilirubin: 0.4 mg/dL (ref 0.2–1.2)
Total Protein: 7.3 g/dL (ref 6.0–8.3)

## 2015-11-27 LAB — BASIC METABOLIC PANEL
BUN: 22 mg/dL (ref 6–23)
CHLORIDE: 102 meq/L (ref 96–112)
CO2: 30 meq/L (ref 19–32)
CREATININE: 1.06 mg/dL (ref 0.40–1.20)
Calcium: 9.5 mg/dL (ref 8.4–10.5)
GFR: 56.32 mL/min — ABNORMAL LOW (ref >60.00)
GLUCOSE: 90 mg/dL (ref 70–99)
POTASSIUM: 3.9 meq/L (ref 3.5–5.1)
Sodium: 139 mEq/L (ref 135–145)

## 2015-11-27 LAB — LIPID PANEL
CHOLESTEROL: 255 mg/dL — AB (ref 0–200)
HDL: 88.8 mg/dL (ref >39.00)
LDL Cholesterol: 156 mg/dL — ABNORMAL HIGH (ref 0–99)
NonHDL: 166.12
TRIGLYCERIDES: 52 mg/dL (ref 0.0–149.0)
Total CHOL/HDL Ratio: 3
VLDL: 10.4 mg/dL (ref 0.0–40.0)

## 2015-12-04 ENCOUNTER — Ambulatory Visit (INDEPENDENT_AMBULATORY_CARE_PROVIDER_SITE_OTHER): Payer: BLUE CROSS/BLUE SHIELD | Admitting: Family Medicine

## 2015-12-04 ENCOUNTER — Encounter: Payer: Self-pay | Admitting: Family Medicine

## 2015-12-04 VITALS — BP 118/70 | HR 76 | Temp 98.3°F | Resp 12 | Ht 67.5 in | Wt 133.2 lb

## 2015-12-04 DIAGNOSIS — F411 Generalized anxiety disorder: Secondary | ICD-10-CM

## 2015-12-04 DIAGNOSIS — R1031 Right lower quadrant pain: Secondary | ICD-10-CM | POA: Diagnosis not present

## 2015-12-04 DIAGNOSIS — Z0001 Encounter for general adult medical examination with abnormal findings: Secondary | ICD-10-CM

## 2015-12-04 DIAGNOSIS — Z87448 Personal history of other diseases of urinary system: Secondary | ICD-10-CM | POA: Diagnosis not present

## 2015-12-04 DIAGNOSIS — Z1211 Encounter for screening for malignant neoplasm of colon: Secondary | ICD-10-CM | POA: Diagnosis not present

## 2015-12-04 DIAGNOSIS — Z0000 Encounter for general adult medical examination without abnormal findings: Secondary | ICD-10-CM

## 2015-12-04 LAB — URINALYSIS, ROUTINE W REFLEX MICROSCOPIC
BILIRUBIN URINE: NEGATIVE
Hgb urine dipstick: NEGATIVE
Ketones, ur: NEGATIVE
Leukocytes, UA: NEGATIVE
Nitrite: NEGATIVE
PH: 7.5 (ref 5.0–8.0)
RBC / HPF: NONE SEEN (ref 0–?)
Specific Gravity, Urine: 1.01 (ref 1.000–1.030)
TOTAL PROTEIN, URINE-UPE24: NEGATIVE
UROBILINOGEN UA: 0.2 (ref 0.0–1.0)
Urine Glucose: NEGATIVE
WBC, UA: NONE SEEN (ref 0–?)

## 2015-12-04 NOTE — Progress Notes (Signed)
Pre visit review using our clinic review tool, if applicable. No additional management support is needed unless otherwise documented below in the visit note. 

## 2015-12-04 NOTE — Patient Instructions (Addendum)
A few things to remember from today's visit:   Routine general medical examination at a health care facility  RLQ abdominal pain - Plan: Urinalysis, Routine w reflex microscopic  History of changes to urinary frequency - Plan: Urinalysis, Routine w reflex microscopic, Urine Culture  Generalized anxiety disorder  Colon cancer screening  I do not think pain is related to urine infection. ?colitis,scar tissue,gynecologis  At least 150 minutes of moderate exercise per week, daily brisk walking for 15-30 min is a good exercise option. Healthy diet low in saturated (animal) fats and sweets and consisting of fresh fruits and vegetables, lean meats such as fish and white chicken and whole grains.   - Vaccines:  Tdap vaccine every 10 years.  Shingles vaccine recommended at age 22, could be given after 59 years of age but not sure about insurance coverage.  Pneumonia vaccines:  Prevnar 13 at 65 and Pneumovax at 15.  Screening recommendations for low/normal risk women:  Screening for diabetes at age 42-45 and every 3 years.  Cervical cancer prevention:  -HPV vaccination between 49-71 years old. -Pap smear starts at 59 years of age and continues periodically until 59 years old in low risk women. Pap smear every 3 years between 49 and 47 years old. Pap smear every 3 years between women 77 and older if pap smear negative and HPV screening negative.   -Breast cancer: Mammogram: There is disagreement between experts about when to start screening in low risk asymptomatic female but recent recommendations are to start screening at 41 and not later than 59 years old , every 1-2 years and after 59 yo q 2 years. Screening is recommended until 59 years old but some women can continue screening depending of healthy issues.   Colon cancer screening: starts at 58 years old until 59 years old.  Cholesterol disorder screening at age 59 and every 3 years.  Calcium 1200 mg daily.  Consider  treatment for osteoporosis.   Please be sure medication list is accurate. If a new problem present, please set up appointment sooner than planned today.

## 2015-12-04 NOTE — Progress Notes (Signed)
HPI:   Shelby Grant is a 60 y.o. female, who is here today for her routine physical. She is former pt of Dr Sherren Mocha.   She exercises once per week,straightening  exercises with trainer. She follows a healthful diet.   Chronic medical problems: anxiety,SVT, s/p left renal cancer with partial nephrectomy, HLD.   She is on Metoprolol XL 25 mg 1/2 tab daily for SVT, has not had episodes in a while. She was following with cardiologists a couple years ago. Tolerating medication well.  Pap smear 2016, follows with gyn, Dr Deatra Ina  Hx of STD's: Denies  Mammogram 02/2015, per pt report, it was fine. Colonoscopy 07/2007 DEXA, she has had it, recommended by otho, per pt report she has osteoporosis (DEXA 12/2014). She doesn't take Ca++ supplementation consistently, she is on OTC vitamin D.   FHx for gynecologic or colon cancer negative.  Hep C screening: Denies risk factors.  She had labs recently:  Lab Results  Component Value Date   TSH 1.90 11/27/2015   Lab Results  Component Value Date   WBC 5.7 11/27/2015   HGB 13.6 11/27/2015   HCT 39.7 11/27/2015   MCV 89.6 11/27/2015   PLT 261.0 11/27/2015   Lab Results  Component Value Date   CREATININE 1.06 11/27/2015   BUN 22 11/27/2015   NA 139 11/27/2015   K 3.9 11/27/2015   CL 102 11/27/2015   CO2 30 11/27/2015   Lab Results  Component Value Date   ALT 19 11/27/2015   AST 19 11/27/2015   ALKPHOS 59 11/27/2015   BILITOT 0.4 11/27/2015   Urinalysis: negative.   She has a few concerns today: RLQ pain, urinary frequency, and "other issues" we could not address today.   -RLQ Abdominal pain that she has had more frequent for the past 2 weeks, it is intermittent, sometimes radiated to right lower back, aching and burning sensation, she has not identified exacerbating factors or alleviating factors, sometimes he seems to be aggravated by "gas" but he doesn't seem to be relieved by defecation.   She denies any  associated chills, fever, nausea, vomiting, changes in bowel habits, blood in the stool, or skin rash.  She states that she has had this pain intermittently for a few months, states that frequently she has been treated for UTIs.  S/P appendectomy 08/2014.  She has followed with GI in the past because bloating sensation, mild constipation and upper abdominal pain. 2012 RLQ pain, Dx with rupture ovarian cyst.    -She is also complaining of urinary frequency, which she has had for "a while", nocturia, she denies any dysuria or urinary incontinence. + Nocturia. She is reporting UTI treatment a couple weeks ago. She denies vaginal discharge, vaginal bleeding, or suprapubic discomfort.  -Anxiety:  Currently she is on Wellbutrin XL 300 mg daily and Clonazepam 1 mg daily as needed. She has been oin these medications for long time and hoping to be able to stop Wellbutrin, she still needs Clonazepam about 5-6 times per week and just got a 3 month supply with a refill on 11/03/15. She denies depressed mood or suicidal thoughts.   Review of Systems  Constitutional: Negative for activity change, appetite change, chills, fatigue, fever and unexpected weight change.  HENT: Negative for facial swelling, hearing loss, mouth sores, sore throat, trouble swallowing and voice change.   Eyes: Negative for photophobia, pain and visual disturbance.  Respiratory: Negative for cough, shortness of breath and wheezing.  Cardiovascular: Negative for chest pain, palpitations and leg swelling.  Gastrointestinal: Positive for abdominal pain. Negative for blood in stool, nausea and vomiting.       No changes in bowel habits.  Endocrine: Negative for cold intolerance, heat intolerance, polydipsia, polyphagia and polyuria.  Genitourinary: Positive for frequency and pelvic pain. Negative for decreased urine volume, difficulty urinating, dysuria, flank pain, hematuria, urgency, vaginal bleeding and vaginal discharge.   Musculoskeletal: Positive for arthralgias. Negative for back pain and neck pain.  Skin: Negative for color change and rash.  Neurological: Negative for syncope, weakness, numbness and headaches.  Hematological: Negative for adenopathy. Does not bruise/bleed easily.  Psychiatric/Behavioral: Negative for confusion and sleep disturbance. The patient is nervous/anxious.   All other systems reviewed and are negative.     Current Outpatient Prescriptions on File Prior to Visit  Medication Sig Dispense Refill  . buPROPion (WELLBUTRIN XL) 300 MG 24 hr tablet take 1 tablet by mouth once daily 100 tablet 3  . Calcium-Magnesium-Vitamin D (CALCIUM MAGNESIUM PO) Take 2 tablets by mouth daily.    . clonazePAM (KLONOPIN) 1 MG tablet take 1 tablet by mouth once daily 90 tablet 1  . metoprolol succinate (TOPROL-XL) 25 MG 24 hr tablet Take 0.5 tablets (12.5 mg total) by mouth 2 (two) times daily. (Patient taking differently: Take 12.5 mg by mouth at bedtime. Patient takes only at hs) 100 tablet 3  . Probiotic Product (ALIGN) 4 MG CAPS Take 1 capsule by mouth daily.    . Pyridoxine HCl (VITAMIN B-6 PO) Take 1 tablet by mouth daily.    . TURMERIC PO Take 1 tablet by mouth daily.    . [DISCONTINUED] estrogen, conjugated,-medroxyprogesterone (PREMPRO) 0.625-2.5 MG per tablet Take 1 tablet by mouth daily. 28 tablet 11   No current facility-administered medications on file prior to visit.      Past Medical History:  Diagnosis Date  . ALLERGIC RHINITIS 07/03/2008  . ANXIETY 06/02/2007  . Arthritis   . BELL'S PALSY, LEFT 12/02/2008  . Cancer ([place])   . CELLULITIS, LEFT LEG 09/14/2008  . CHEST PAIN 07/07/2009  . Complication of anesthesia 08-28-2014   "work up in recovery room with jaw locked open"  . CORNS AND CALLUSES 07/03/2008  . Depression   . FOOT PAIN 09/22/2009  . IRREGULAR MENSES 06/03/2008  . Neoplasm    left renal  . OCD (obsessive compulsive disorder)   . Stress fracture of ankle since Jun 29, 2014    left  . Supraventricular tachycardia ([place]) 06/02/2007   Atrial tachycardia with Wenckebach periodicity    Allergies  Allergen Reactions  . Latex     REACTION: rash  . Amoxicillin Rash    Has patient had a PCN reaction causing immediate rash, facial/tongue/throat swelling, SOB or lightheadedness with hypotension: unkown Has patient had a PCN reaction causing severe rash involving mucus membranes or skin necrosis: no Has patient had a PCN reaction that required hospitalization No Has patient had a PCN reaction occurring within the last 10 years: unknown If all of the above answers are "NO", then may proceed with Cephalosporin use.    Family History  Problem Relation Age of Onset  . Asthma Sister   . Cancer Mother     Marena Chancy where cancer started     Social History   Social History  . Marital status: Married    Spouse name: N/A  . Number of children: N/A  . Years of education: N/A   Occupational History  . 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 Smoking status: Never Smoker  . Smokeless tobacco: Never Used  . Alcohol use Yes     Comment: social  . Drug use: No  . Sexual activity: Yes     Comment: married   Other Topics Concern  . None   Social History Narrative   No caffeine      Vitals:   12/04/15 0759  BP: 118/70  Pulse: 76  Resp: 12  Temp: 98.3 F (36.8 C)   Body mass index is 20.56 kg/m.  O2 sat at RA 96%.  Wt Readings from Last 3 Encounters:  12/04/15 133 lb 4 oz (60.4 kg)  09/13/15 134 lb 4 oz (60.9 kg)  08/01/15 133 lb (60.3 kg)      Physical Exam  Nursing note and vitals reviewed. Constitutional: She is oriented to person, place, and time. She appears well-developed and well-nourished. No distress.  HENT:  Head: Atraumatic.  Right Ear: Hearing, tympanic membrane, external ear and ear canal normal.  Left Ear: Hearing, tympanic membrane, external ear and ear canal normal.  Mouth/Throat: Uvula is midline,  oropharynx is clear and moist and mucous membranes are normal.  Eyes: Conjunctivae and EOM are normal. Pupils are equal, round, and reactive to light.  Neck: No JVD present. No tracheal deviation present. No thyromegaly present.  Cardiovascular: Normal rate and regular rhythm.   No murmur heard. Pulses:      Dorsalis pedis pulses are 2+ on the right side, and 2+ on the left side.       Posterior tibial pulses are 2+ on the right side, and 2+ on the left side.  Respiratory: Effort normal and breath sounds normal. No respiratory distress.  GI: Soft. Bowel sounds are normal. She exhibits no mass. There is no hepatomegaly. There is no tenderness. No hernia. Hernia confirmed negative in the right inguinal area.  Musculoskeletal: She exhibits no edema.  No major deformity or sing of synovitis appreciated.  Lymphadenopathy:    She has no cervical adenopathy.       Right: No supraclavicular adenopathy present.       Left: No supraclavicular adenopathy present.  Neurological: She is alert and oriented to person, place, and time. She has normal strength. No cranial nerve deficit. Coordination and gait normal.  Reflex Scores:      Bicep reflexes are 2+ on the right side and 2+ on the left side.      Patellar reflexes are 2+ on the right side and 2+ on the left side. Skin: Skin is warm. No rash noted. No erythema.  Psychiatric: Her speech is normal. Her mood appears anxious.  Well groomed, good eye contact.      ASSESSMENT AND PLAN:    Javetta was seen today for establish care.  Diagnoses and all orders for this visit:   Routine general medical examination at a health care facility   We discussed the importance of regular physical activity and healthy diet for prevention of chronic illness and/or complications. Preventive guidelines reviewed. Vaccination up to date, reporting also Zoster vaccine 09/2015 when she got her influenza vaccine. Continue following with gyn for her preventive  female care. Fall precautions. Ca++ and vit D supplementation discussed and recommended.  Next CPE in 1 year.   RLQ abdominal pain  We discussed possible causes, I do not think it is related to UTI. Recommend arranging appt with her gyn for evaluation of possible gyn causes. Examination today does  not suggest a concerning process. Recent labs otherwise normal (CBC,FLP,and CBC). Urine repeated today and negative.  -     Urinalysis, Routine w reflex microscopic  History of changes to urinary frequency  ? Overactive bladder. She is not interested in pharmacologic treatment. Recent U/A negative, treated for UTI and still symptomatic. We discussed other possible causes, she would like another urine test done.  May consider urology referral.  -     Urinalysis, Routine w reflex microscopic -     Urine Culture  Generalized anxiety disorder  For now I recommend continuing medications, still anxiety not well controlled. F/U in 3-4 months.  Colon cancer screening  Immunochemical fecal test cards given.   -In regard to osteoporosis, benefits and some side effects of medications discussed, not interested in treated.       OV 8:06 am to 9:02 am face to face. > 50% of time was dedicated to reassurance and discussion of warning signs. Discussion of abnormal results in detail and plan of care.    Return in about 4 weeks (around 01/01/2016) for RLQ pain +"other issues".          Serafina Topham G. Martinique, MD  [facility]. [place] office.

## 2015-12-06 LAB — URINE CULTURE

## 2015-12-07 ENCOUNTER — Encounter: Payer: Self-pay | Admitting: Family Medicine

## 2015-12-10 ENCOUNTER — Other Ambulatory Visit: Payer: BLUE CROSS/BLUE SHIELD

## 2015-12-10 ENCOUNTER — Encounter: Payer: Self-pay | Admitting: Family Medicine

## 2015-12-10 DIAGNOSIS — Z01419 Encounter for gynecological examination (general) (routine) without abnormal findings: Secondary | ICD-10-CM | POA: Diagnosis not present

## 2015-12-10 DIAGNOSIS — R102 Pelvic and perineal pain: Secondary | ICD-10-CM | POA: Diagnosis not present

## 2015-12-10 DIAGNOSIS — Z6821 Body mass index (BMI) 21.0-21.9, adult: Secondary | ICD-10-CM | POA: Diagnosis not present

## 2015-12-11 ENCOUNTER — Other Ambulatory Visit (INDEPENDENT_AMBULATORY_CARE_PROVIDER_SITE_OTHER): Payer: BLUE CROSS/BLUE SHIELD

## 2015-12-11 DIAGNOSIS — R195 Other fecal abnormalities: Secondary | ICD-10-CM

## 2015-12-11 LAB — FECAL OCCULT BLOOD, IMMUNOCHEMICAL: FECAL OCCULT BLD: NEGATIVE

## 2016-01-01 ENCOUNTER — Encounter: Payer: Self-pay | Admitting: Family Medicine

## 2016-01-01 ENCOUNTER — Ambulatory Visit (INDEPENDENT_AMBULATORY_CARE_PROVIDER_SITE_OTHER): Payer: BLUE CROSS/BLUE SHIELD | Admitting: Family Medicine

## 2016-01-01 VITALS — BP 116/80 | HR 71 | Resp 12 | Ht 67.5 in | Wt 135.5 lb

## 2016-01-01 DIAGNOSIS — S99911S Unspecified injury of right ankle, sequela: Secondary | ICD-10-CM | POA: Diagnosis not present

## 2016-01-01 DIAGNOSIS — R350 Frequency of micturition: Secondary | ICD-10-CM | POA: Diagnosis not present

## 2016-01-01 DIAGNOSIS — F411 Generalized anxiety disorder: Secondary | ICD-10-CM | POA: Diagnosis not present

## 2016-01-01 NOTE — Progress Notes (Signed)
HPI:   [name] is a 60 y.o. female, who is here today to follow on some of her chronic medical problems   I saw her last on 12/04/2015 for her routine physical, at that time she was complaining of persistent intermittently RLQ pain and urinary frequency. Today she is reporting improvement in irine frequency as well as abdominal pain. She is currently following with urologist because her history of renal carcinoma, she didn't arrange appointment to discuss urinary frequency. She denies any dysuria, urinary incontinence, or gross hematuria.   Still aching sensation RLQ, mild, and occasionally, it seems to be exacerbated by stress.  She is reporting gyn examination and pelvic ultrasound, both negative for any abnormality.  Today she mentions that years ago she was Dx with IBS. Usually she has small bowel movement and intermittently loose stool. Problem has been stable.   Denies nausea, vomiting, changes in bowel habits, blood in stool or melena.  Anxiety: Currently she is on Wellbutrin XL 300 mg daily and clonazepam 1 mg, the latter one she takes as needed for anxiety and insomnia. She denies depressed mood or suicidal thoughts. Currently she is going through some stress, work related. She is tolerating medication well and denies any side effect.    Concerns today: Ankle issue.  According to patient, last week of August 2017 she fell while she was caring her kayak, it fell on her right ankle (medial malleolus), developed pain and erythema, mild so she didn't seek immediate medical attention. Once she was back she was evaluated, about 1-2 weeks after trauma, imaging was performed and according to patient abnormality was seen. Since trauma she has noted changes in ankle "shape" , specifically on area of trauma. No edema, no ecchymosis, no limitation of ROM. Occasionally some discomfort on medial ankle joint.  According to patient she has already addressed this  issue with former PCP and was reassure but she is still worry about this.    Review of Systems  Constitutional: Negative for activity change, appetite change, fatigue and unexpected weight change.  HENT: Negative for facial swelling, mouth sores and trouble swallowing.   Gastrointestinal: Negative for abdominal pain, nausea and vomiting.       No changes in bowel habits.  Genitourinary: Negative for decreased urine volume, difficulty urinating, dysuria and hematuria.  Musculoskeletal: Negative for gait problem and myalgias.  Skin: Negative for color change and rash.  Neurological: Negative for syncope, weakness, numbness and headaches.  Psychiatric/Behavioral: Negative for confusion and suicidal ideas. The patient is nervous/anxious.       Current Outpatient Prescriptions on File Prior to Visit  Medication Sig Dispense Refill  . buPROPion (WELLBUTRIN XL) 300 MG 24 hr tablet take 1 tablet by mouth once daily 100 tablet 3  . Calcium-Magnesium-Vitamin D (CALCIUM MAGNESIUM PO) Take 4 tablets by mouth daily.     . clonazePAM (KLONOPIN) 1 MG tablet take 1 tablet by mouth once daily 90 tablet 1  . metoprolol succinate (TOPROL-XL) 25 MG 24 hr tablet Take 0.5 tablets (12.5 mg total) by mouth 2 (two) times daily. (Patient taking differently: Take 12.5 mg by mouth at bedtime. Patient takes only at hs) 100 tablet 3  . Probiotic Product (ALIGN) 4 MG CAPS Take 1 capsule by mouth daily.    . Pyridoxine HCl (VITAMIN B-6 PO) Take 1 tablet by mouth daily.    . TURMERIC PO Take 3 tablets by mouth daily.     . [DISCONTINUED] estrogen, conjugated,-medroxyprogesterone (PREMPRO)  0.625-2.5 MG per tablet Take 1 tablet by mouth daily. 28 tablet 11   No current facility-administered medications on file prior to visit.      Past Medical History:  Diagnosis Date  . ALLERGIC RHINITIS 07/03/2008  . ANXIETY 06/02/2007  . Arthritis   . BELL'S PALSY, LEFT 12/02/2008  . Cancer ([place])   . CELLULITIS, LEFT LEG  09/14/2008  . CHEST PAIN 07/07/2009  . Complication of anesthesia 08-28-2014   "work up in recovery room with jaw locked open"  . CORNS AND CALLUSES 07/03/2008  . Depression   . FOOT PAIN 09/22/2009  . IRREGULAR MENSES 06/03/2008  . Neoplasm    left renal  . OCD (obsessive compulsive disorder)   . Stress fracture of ankle since Jun 29, 2014   left  . Supraventricular tachycardia ([place]) 06/02/2007   Atrial tachycardia with Wenckebach periodicity   Allergies  Allergen Reactions  . Latex     REACTION: rash  . Amoxicillin Rash    Has patient had a PCN reaction causing immediate rash, facial/tongue/throat swelling, SOB or lightheadedness with hypotension: unkown Has patient had a PCN reaction causing severe rash involving mucus membranes or skin necrosis: no Has patient had a PCN reaction that required hospitalization No Has patient had a PCN reaction occurring within the last 10 years: unknown If all of the above answers are "NO", then may proceed with Cephalosporin use.    Social History   Social History  . Marital status: Married    Spouse name: N/A  . Number of children: N/A  . Years of education: N/A   Occupational History  . 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 Smoking status: Never Smoker  . Smokeless tobacco: Never Used  . Alcohol use Yes     Comment: social  . Drug use: No  . Sexual activity: Yes     Comment: married   Other Topics Concern  . None   Social History Narrative   No caffeine     Vitals:   01/01/16 0805  BP: 116/80  Pulse: 71  Resp: 12   Body mass index is 20.91 kg/m.      Physical Exam  Constitutional: She is oriented to person, place, and time. She appears well-developed and well-nourished. No distress.  HENT:  Mouth/Throat: Oropharynx is clear and moist and mucous membranes are normal.  Eyes: Conjunctivae and EOM are normal.  Cardiovascular: Normal rate and regular rhythm.   No murmur heard. Pulses:       Dorsalis pedis pulses are 2+ on the right side.       Posterior tibial pulses are 2+ on the right side.  Respiratory: Effort normal and breath sounds normal. No respiratory distress.  GI: Soft. She exhibits no mass. There is no tenderness. There is no CVA tenderness.  Musculoskeletal: She exhibits no edema or tenderness.       Right ankle: She exhibits normal range of motion and no swelling.  Mild depression under medial malleolus and medial malleolus seems more prominent. Normal ROM and no pain elicited with palpation or movement.   Lymphadenopathy:    She has no cervical adenopathy.       Right: No supraclavicular adenopathy present.       Left: No supraclavicular adenopathy present.  Neurological: She is alert and oriented to person, place, and time. She has normal strength. Coordination and gait normal.  Skin: Skin is warm. No erythema.  Psychiatric: Her speech is  normal. Her mood appears anxious. Cognition and memory are normal.  Well groomed, good eye contact.      ASSESSMENT AND PLAN:     Shelby Grant was seen today for follow-up.  Diagnoses and all orders for this visit:   Injury of right ankle, sequela  Examination today normal except for mild prominence of the medial malleolus noted on inspection. I reassured patient, we can monitor for now and if medial malleolus becomes more prominent or if she develops persistent/worsening pain we can proceed with further imaging.  Urinary frequency  Improved. We discussed possible causes, ?overactive bladder. She prefers to hold on pharmacologic treatment. She will keep her routine f/u appointment with urologist.  Generalized anxiety disorder  Still symptomatic, for now she will like to continue same medications.  She is not interested in following q 6 months so if she does not need refil on Clonazepam within the next 6 months she can follow when she does (within 10-12 months), before if worsening symptoms. Psychotherapy  might help but she is not interested.     -Ms. Shelby Grant was advised to return sooner than planned today if new concerns arise.       Ignatz Deis G. Martinique, MD  [facility]. [place] office.

## 2016-01-01 NOTE — Patient Instructions (Signed)
A few things to remember from today's visit:   Injury of right ankle, sequela  Generalized anxiety disorder   No changes in medications.   Please be sure medication list is accurate. If a new problem present, please set up appointment sooner than planned today.

## 2016-01-01 NOTE — Progress Notes (Signed)
Pre visit review using our clinic review tool, if applicable. No additional management support is needed unless otherwise documented below in the visit note. 

## 2016-02-13 DIAGNOSIS — Z85528 Personal history of other malignant neoplasm of kidney: Secondary | ICD-10-CM | POA: Diagnosis not present

## 2016-02-16 DIAGNOSIS — N642 Atrophy of breast: Secondary | ICD-10-CM | POA: Diagnosis not present

## 2016-02-16 DIAGNOSIS — C642 Malignant neoplasm of left kidney, except renal pelvis: Secondary | ICD-10-CM | POA: Diagnosis not present

## 2016-02-20 ENCOUNTER — Ambulatory Visit (HOSPITAL_COMMUNITY)
Admission: RE | Admit: 2016-02-20 | Discharge: 2016-02-20 | Disposition: A | Discharge status: Home / Self Care | Payer: BLUE CROSS/BLUE SHIELD | Source: Ambulatory Visit | Attending: Urology | Admitting: Urology

## 2016-02-20 ENCOUNTER — Other Ambulatory Visit (HOSPITAL_COMMUNITY): Payer: Self-pay | Admitting: Urology

## 2016-02-20 DIAGNOSIS — Z85528 Personal history of other malignant neoplasm of kidney: Secondary | ICD-10-CM | POA: Diagnosis not present

## 2016-02-20 DIAGNOSIS — Z0389 Encounter for observation for other suspected diseases and conditions ruled out: Secondary | ICD-10-CM | POA: Diagnosis not present

## 2016-03-02 ENCOUNTER — Other Ambulatory Visit: Payer: Self-pay | Admitting: Family Medicine

## 2016-03-19 ENCOUNTER — Ambulatory Visit (INDEPENDENT_AMBULATORY_CARE_PROVIDER_SITE_OTHER): Payer: BLUE CROSS/BLUE SHIELD | Admitting: Family Medicine

## 2016-03-19 ENCOUNTER — Encounter: Payer: Self-pay | Admitting: Family Medicine

## 2016-03-19 VITALS — BP 120/70 | HR 69 | Temp 98.2°F | Resp 12 | Ht 67.5 in | Wt 134.1 lb

## 2016-03-19 DIAGNOSIS — J029 Acute pharyngitis, unspecified: Secondary | ICD-10-CM | POA: Diagnosis not present

## 2016-03-19 DIAGNOSIS — J069 Acute upper respiratory infection, unspecified: Secondary | ICD-10-CM | POA: Diagnosis not present

## 2016-03-19 LAB — POCT RAPID STREP A (OFFICE): Rapid Strep A Screen: NEGATIVE

## 2016-03-19 LAB — POC INFLUENZA A&B (BINAX/QUICKVUE)
INFLUENZA B, POC: NEGATIVE
Influenza A, POC: NEGATIVE

## 2016-03-19 NOTE — Progress Notes (Signed)
Pre visit review using our clinic review tool, if applicable. No additional management support is needed unless otherwise documented below in the visit note. 

## 2016-03-19 NOTE — Progress Notes (Signed)
HPI:  ACUTE VISIT  Chief Complaint  Patient presents with  . Sore Throat    [name] is a 61 y.o.female here today complaining of 2-3 days of respiratory symptoms. She is concerned about "tonsillitis."  Mainly non productive cough. A couple times she noted "reddish"-clear mucus. + Nasal congestion, rhinorrhea, sore throat, and post nasal drainage.  She had fever until 03/17/16 night , max 101 F. + Chills, and  body aches. She has not noted chest pain, dyspnea, or wheezing. Mild diarrhea, loose stools, one yesterday and 2 the days before.  Denies abdominal pain, nausea, vomiting,or  blood in stool..  No Hx of recent travel. No sick contact. No known insect bite.  Hx of allergies: Yes.  OTC medications for this problem: Cough drops and tree oil. She has not taken antipyretics or analgesics.  Symptoms otherwise feeling better today.   Review of Systems  Constitutional: Positive for appetite change, fatigue and fever. Negative for chills.  HENT: Positive for postnasal drip and sore throat. Negative for congestion, ear pain, mouth sores, nosebleeds, sinus pressure, sneezing, trouble swallowing and voice change.   Eyes: Negative for discharge, redness and visual disturbance.  Respiratory: Positive for cough. Negative for shortness of breath and wheezing.   Cardiovascular: Negative for leg swelling.  Gastrointestinal: Positive for diarrhea. Negative for abdominal pain, nausea and vomiting.  Genitourinary: Negative for decreased urine volume, dysuria and hematuria.  Musculoskeletal: Positive for myalgias. Negative for gait problem and neck pain.  Skin: Negative for rash.  Neurological: Positive for headaches (global). Negative for syncope, weakness and numbness.  Hematological: Negative for adenopathy. Does not bruise/bleed easily.  Psychiatric/Behavioral: Negative for confusion. The patient is nervous/anxious.       Current Outpatient Prescriptions on  File Prior to Visit  Medication Sig Dispense Refill  . buPROPion (WELLBUTRIN XL) 300 MG 24 hr tablet take 1 tablet by mouth once daily 100 tablet 3  . Calcium-Magnesium-Vitamin D (CALCIUM MAGNESIUM PO) Take 4 tablets by mouth daily.     . clonazePAM (KLONOPIN) 1 MG tablet take 1 tablet by mouth once daily 90 tablet 1  . metoprolol succinate (TOPROL-XL) 25 MG 24 hr tablet Take 0.5 tablets (12.5 mg total) by mouth 2 (two) times daily. (Patient taking differently: Take 12.5 mg by mouth at bedtime. Patient takes only at hs) 100 tablet 3  . metoprolol succinate (TOPROL-XL) 25 MG 24 hr tablet take 1/2 tablet by mouth twice a day 100 tablet 4  . Probiotic Product (ALIGN) 4 MG CAPS Take 1 capsule by mouth daily.    . Pyridoxine HCl (VITAMIN B-6 PO) Take 1 tablet by mouth daily.    . TURMERIC PO Take 3 tablets by mouth daily.     . [DISCONTINUED] estrogen, conjugated,-medroxyprogesterone (PREMPRO) 0.625-2.5 MG per tablet Take 1 tablet by mouth daily. 28 tablet 11   No current facility-administered medications on file prior to visit.      Past Medical History:  Diagnosis Date  . ALLERGIC RHINITIS 07/03/2008  . ANXIETY 06/02/2007  . Arthritis   . BELL'S PALSY, LEFT 12/02/2008  . Cancer ([place])   . CELLULITIS, LEFT LEG 09/14/2008  . CHEST PAIN 07/07/2009  . Complication of anesthesia 08-28-2014   "work up in recovery room with jaw locked open"  . CORNS AND CALLUSES 07/03/2008  . Depression   . FOOT PAIN 09/22/2009  . IRREGULAR MENSES 06/03/2008  . Neoplasm    left renal  . OCD (obsessive compulsive disorder)   .  Stress fracture of ankle since Jun 29, 2014   left  . Supraventricular tachycardia ([place]) 06/02/2007   Atrial tachycardia with Wenckebach periodicity   Allergies  Allergen Reactions  . Latex     REACTION: rash  . Amoxicillin Rash    Has patient had a PCN reaction causing immediate rash, facial/tongue/throat swelling, SOB or lightheadedness with hypotension: unkown Has patient had a PCN reaction  causing severe rash involving mucus membranes or skin necrosis: no Has patient had a PCN reaction that required hospitalization No Has patient had a PCN reaction occurring within the last 10 years: unknown If all of the above answers are "NO", then may proceed with Cephalosporin use.    Social History   Social History  . Marital status: Married    Spouse name: N/A  . Number of children: N/A  . Years of education: N/A   Occupational History  . 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 Smoking status: Never Smoker  . Smokeless tobacco: Never Used  . Alcohol use Yes     Comment: social  . Drug use: No  . Sexual activity: Yes     Comment: married   Other Topics Concern  . None   Social History Narrative   No caffeine     Vitals:   03/19/16 1102  BP: 120/70  Pulse: 69  Temp: 98.2 F (36.8 C)  O2 sat 98% at RA. Body mass index is 20.7 kg/m.    Physical Exam  Nursing note and vitals reviewed. Constitutional: She is oriented to person, place, and time. She appears well-developed and well-nourished. She does not appear ill. No distress.  HENT:  Head: Atraumatic.  Right Ear: Tympanic membrane, external ear and ear canal normal.  Left Ear: Tympanic membrane, external ear and ear canal normal.  Nose: Rhinorrhea present. Right sinus exhibits no maxillary sinus tenderness and no frontal sinus tenderness. Left sinus exhibits no maxillary sinus tenderness and no frontal sinus tenderness.  Mouth/Throat: Uvula is midline and mucous membranes are normal. Posterior oropharyngeal erythema (mild) present. No oropharyngeal exudate or posterior oropharyngeal edema.  Eyes: Conjunctivae and EOM are normal.  Neck: No muscular tenderness present.  Cardiovascular: Normal rate and regular rhythm.   No murmur heard. Respiratory: Effort normal and breath sounds normal. No stridor. No respiratory distress.  Lymphadenopathy:       Head (right side): No  submandibular adenopathy present.       Head (left side): No submandibular adenopathy present.    She has cervical adenopathy.       Right cervical: Posterior cervical adenopathy present.       Left cervical: Posterior cervical adenopathy present.  Neurological: She is alert and oriented to person, place, and time. She has normal strength. No cranial nerve deficit. Coordination and gait normal.  Skin: Skin is warm. No rash noted. No erythema.  Psychiatric: Her speech is normal. Her mood appears anxious.  Well groomed, good eye contact.      ASSESSMENT AND PLAN:     Weatherly was seen today for sore throat.  Diagnoses and all orders for this visit:  URI, acute -     POC Influenza A&B (Binax test)  Acute pharyngitis, unspecified etiology -     POC Rapid Strep A  Rapid strep and flu test both negative. Finding today are not suggesting of strep pharyngitis or tonsillitis,so sample was not sent for Cx.  Symptoms suggests a viral etiology, I explained Ms Wall  that symptomatic treatment is usually recommended in this case, so I do not think abx is needed at this time. Lidocaine viscous may help with odynophagia but she prefers to hold on it.  Instructed to monitor for signs of complications, including new onset of fever among some,  instructed about warning signs. I also explained that cough and nasal congestion can last a few days and sometimes weeks. F/U as needed.     -Ms. Shelby Grant was advised to return or notify a doctor immediately if symptoms worsen or persist or new concerns arise.    Shelby Grant G. Martinique, MD  [facility]. [place] office.

## 2016-03-19 NOTE — Patient Instructions (Addendum)
  [name] I have seen you today for an acute visit.  A few things to remember from today's visit:   URI, acute  Acute pharyngitis, unspecified etiology   Symptoms suggest a viral illness.  viral infections are self-limited and we treat each symptom depending of severity.  Over the counter medications as decongestants and cold medications usually help, they need to be taken with caution if there is a history of high blood pressure or palpitations. Tylenol and/or Ibuprofen also helps with most symptoms (headache, muscle aching, fever,etc) Plenty of fluids. Honey helps with cough. Steam inhalations helps with runny nose, nasal congestion, and may prevent sinus infections. Cough and nasal congestion could last a few days and sometimes weeks. Please follow in not any better in 1-2 weeks or if symptoms get worse.   Symptomatic treatment: Over the counter Acetaminophen 500 mg and/or Ibuprofen (400-600 mg) if there is not contraindications; you can alternate in between both every 4-6 hours. Gargles with saline water and throat lozenges might also help. Cold fluids.    Seek prompt medical evaluation if you are having difficulty breathing, mouth swelling, throat closing up, not able to swallow liquids (drooling), skin rash/bruising, or worsening symptoms.        In general please monitor for signs of worsening symptoms and seek immediate medical attention if any concerning.  If symptoms are not resolved in a few days/weeks you should schedule a follow up appointment with your doctor, before if needed.  Please be sure you have an appointment already scheduled with your PCP before you leave today.

## 2016-03-26 ENCOUNTER — Encounter: Payer: Self-pay | Admitting: Family Medicine

## 2016-03-31 ENCOUNTER — Encounter: Payer: Self-pay | Admitting: Family Medicine

## 2016-04-02 ENCOUNTER — Other Ambulatory Visit: Payer: Self-pay | Admitting: Family Medicine

## 2016-04-02 MED ORDER — AZITHROMYCIN 250 MG PO TABS: ORAL_TABLET | ORAL | 0 refills | Status: AC

## 2016-04-07 DIAGNOSIS — M9903 Segmental and somatic dysfunction of lumbar region: Secondary | ICD-10-CM | POA: Diagnosis not present

## 2016-04-07 DIAGNOSIS — M25552 Pain in left hip: Secondary | ICD-10-CM | POA: Diagnosis not present

## 2016-04-07 DIAGNOSIS — M9905 Segmental and somatic dysfunction of pelvic region: Secondary | ICD-10-CM | POA: Diagnosis not present

## 2016-04-07 DIAGNOSIS — S335XXA Sprain of ligaments of lumbar spine, initial encounter: Secondary | ICD-10-CM | POA: Diagnosis not present

## 2016-04-09 DIAGNOSIS — S335XXA Sprain of ligaments of lumbar spine, initial encounter: Secondary | ICD-10-CM | POA: Diagnosis not present

## 2016-04-09 DIAGNOSIS — M9903 Segmental and somatic dysfunction of lumbar region: Secondary | ICD-10-CM | POA: Diagnosis not present

## 2016-04-09 DIAGNOSIS — M25551 Pain in right hip: Secondary | ICD-10-CM | POA: Diagnosis not present

## 2016-04-09 DIAGNOSIS — M9904 Segmental and somatic dysfunction of sacral region: Secondary | ICD-10-CM | POA: Diagnosis not present

## 2016-04-09 DIAGNOSIS — M25552 Pain in left hip: Secondary | ICD-10-CM | POA: Diagnosis not present

## 2016-04-09 DIAGNOSIS — M9905 Segmental and somatic dysfunction of pelvic region: Secondary | ICD-10-CM | POA: Diagnosis not present

## 2016-04-12 DIAGNOSIS — M47814 Spondylosis without myelopathy or radiculopathy, thoracic region: Secondary | ICD-10-CM | POA: Diagnosis not present

## 2016-04-12 DIAGNOSIS — R0781 Pleurodynia: Secondary | ICD-10-CM | POA: Diagnosis not present

## 2016-05-26 DIAGNOSIS — Z1231 Encounter for screening mammogram for malignant neoplasm of breast: Secondary | ICD-10-CM | POA: Diagnosis not present

## 2016-05-26 LAB — HM MAMMOGRAPHY

## 2016-05-31 ENCOUNTER — Encounter: Payer: Self-pay | Admitting: Family Medicine

## 2016-06-10 DIAGNOSIS — H2513 Age-related nuclear cataract, bilateral: Secondary | ICD-10-CM | POA: Diagnosis not present

## 2016-06-18 ENCOUNTER — Other Ambulatory Visit: Payer: Self-pay | Admitting: Family Medicine

## 2016-06-18 DIAGNOSIS — F411 Generalized anxiety disorder: Secondary | ICD-10-CM

## 2016-06-23 ENCOUNTER — Other Ambulatory Visit: Payer: Self-pay | Admitting: Family Medicine

## 2016-06-25 MED ORDER — CLONAZEPAM 1 MG PO TABS: 1.0000 mg | ORAL_TABLET | Freq: Every day | ORAL | 1 refills | Status: DC

## 2016-06-25 NOTE — Telephone Encounter (Signed)
Prescription called to the pharmacy and left on machine.

## 2016-06-25 NOTE — Addendum Note (Signed)
Addended by: Miles Costain T on: 06/25/2016 04:16 PM   Modules accepted: Orders

## 2016-07-14 DIAGNOSIS — B9620 Unspecified Escherichia coli [E. coli] as the cause of diseases classified elsewhere: Secondary | ICD-10-CM | POA: Diagnosis not present

## 2016-07-14 DIAGNOSIS — N390 Urinary tract infection, site not specified: Secondary | ICD-10-CM | POA: Diagnosis not present

## 2016-07-14 DIAGNOSIS — R3982 Chronic bladder pain: Secondary | ICD-10-CM | POA: Diagnosis not present

## 2016-07-20 ENCOUNTER — Ambulatory Visit (INDEPENDENT_AMBULATORY_CARE_PROVIDER_SITE_OTHER): Payer: BLUE CROSS/BLUE SHIELD | Admitting: Family Medicine

## 2016-07-20 ENCOUNTER — Encounter: Payer: Self-pay | Admitting: Family Medicine

## 2016-07-20 VITALS — BP 100/70 | HR 62 | Resp 12 | Ht 67.5 in | Wt 134.4 lb

## 2016-07-20 DIAGNOSIS — M25542 Pain in joints of left hand: Secondary | ICD-10-CM

## 2016-07-20 DIAGNOSIS — H9313 Tinnitus, bilateral: Secondary | ICD-10-CM

## 2016-07-20 DIAGNOSIS — F411 Generalized anxiety disorder: Secondary | ICD-10-CM | POA: Diagnosis not present

## 2016-07-20 DIAGNOSIS — H9192 Unspecified hearing loss, left ear: Secondary | ICD-10-CM

## 2016-07-20 MED ORDER — BUPROPION HCL ER (XL) 300 MG PO TB24: 150.0000 mg | ORAL_TABLET | Freq: Every day | ORAL | 1 refills | Status: DC

## 2016-07-20 NOTE — Patient Instructions (Signed)
A few things to remember from today's visit:   Tinnitus aurium, bilateral - Plan: Ambulatory referral to ENT  Hearing loss of left ear, unspecified hearing loss type - Plan: Ambulatory referral to ENT  Anxiety state - Plan: buPROPion (WELLBUTRIN XL) 300 MG 24 hr tablet  Tinnitus Tinnitus refers to hearing a sound when there is no actual source for that sound. This is often described as ringing in the ears. However, people with this condition may hear a variety of noises. A person may hear the sound in one ear or in both ears. The sounds of tinnitus can be soft, loud, or somewhere in between. Tinnitus can last for a few seconds or can be constant for days. It may go away without treatment and come back at various times. When tinnitus is constant or happens often, it can lead to other problems, such as trouble sleeping and trouble concentrating. Almost everyone experiences tinnitus at some point. Tinnitus that is long-lasting (chronic) or comes back often is a problem that may require medical attention. What are the causes? The cause of tinnitus is often not known. In some cases, it can result from other problems or conditions, including:  Exposure to loud noises from machinery, music, or other sources.  Hearing loss.  Ear or sinus infections.  Earwax buildup.  A foreign object in the ear.  Use of certain medicines.  Use of alcohol and caffeine.  High blood pressure.  Heart diseases.  Anemia.  Allergies.  Meniere disease.  Thyroid problems.  Tumors.  An enlarged part of a weakened blood vessel (aneurysm).  What are the signs or symptoms? The main symptom of tinnitus is hearing a sound when there is no source for that sound. It may sound like:  Buzzing.  Roaring.  Ringing.  Blowing air, similar to the sound heard when you listen to a seashell.  Hissing.  Whistling.  Sizzling.  Humming.  Running water.  A sustained musical note.  How is this  diagnosed? Tinnitus is diagnosed based on your symptoms. Your health care provider will do a physical exam. A comprehensive hearing exam (audiologic exam) will be done if your tinnitus:  Affects only one ear (unilateral).  Causes hearing difficulties.  Lasts 6 months or longer.  You may also need to see a health care provider who specializes in hearing disorders (audiologist). You may be asked to complete a questionnaire to determine the severity of your tinnitus. Tests may be done to help determine the cause and to rule out other conditions. These can include:  Imaging studies of your head and brain, such as: ? A CT scan. ? An MRI.  An imaging study of your blood vessels (angiogram).  How is this treated? Treating an underlying medical condition can sometimes make tinnitus go away. If your tinnitus continues, other treatments may include:  Medicines, such as certain antidepressants or sleeping aids.  Sound generators to mask the tinnitus. These include: ? Tabletop sound machines that play relaxing sounds to help you fall asleep. ? Wearable devices that fit in your ear and play sounds or music. ? A small device that uses headphones to deliver a signal embedded in music (acoustic neural stimulation). In time, this may change the pathways of your brain and make you less sensitive to tinnitus. This device is used for very severe cases when no other treatment is working.  Therapy and counseling to help you manage the stress of living with tinnitus.  Using hearing aids or cochlear implants, if  your tinnitus is related to hearing loss.  Follow these instructions at home:  When possible, avoid being in loud places and being exposed to loud sounds.  Wear hearing protection, such as earplugs, when you are exposed to loud noises.  Do not take stimulants, such as nicotine, alcohol, or caffeine.  Practice techniques for reducing stress, such as meditation, yoga, or deep breathing.  Use a  white noise machine, a humidifier, or other devices to mask the sound of tinnitus.  Sleep with your head slightly raised. This may reduce the impact of tinnitus.  Try to get plenty of rest each night. Contact a health care provider if:  You have tinnitus in just one ear.  Your tinnitus continues for 3 weeks or longer without stopping.  Home care measures are not helping.  You have tinnitus after a head injury.  You have tinnitus along with any of the following: ? Dizziness. ? Loss of balance. ? Nausea and vomiting. This information is not intended to replace advice given to you by your health care provider. Make sure you discuss any questions you have with your health care provider. Document Released: 01/18/2005 Document Revised: 09/21/2015 Document Reviewed: 06/20/2013 Elsevier Interactive Patient Education  2018 [employer].  Please be sure medication list is accurate. If a new problem present, please set up appointment sooner than planned today.

## 2016-07-20 NOTE — Progress Notes (Signed)
HPI:   ACUTE VISIT:  Chief Complaint  Patient presents with  . Tinnitus    [name] is a 61 y.o. female, who is here today complaining of 1.5 months of worsening bilateral tinnitus. It is constant and worse at night when it is quite.  She has also noted left ear hearing loss. She states that she cannot hear as well as she does with the right ear when she is on the phone. This does not affect her daily activities or social interaction. She has history of allergies and has had some nasal congestion and postnasal drainage.  Bilateral "popping" sensation in ears, exacerbated by swallowing but no earache. She did fly in 06/2016, denies recent URI.   She denies any chronic exposure to noise, recurrent ear infections, or ear trauma. She wonders if any of her medications can be aggravating problem, particularly Wellbutrin, which she found out can cause ringing in the ears. She denies frequent use of NSAIDs, she doesn't take Aspirin or diuretics. She is taking Macrobid for UTI.    Ear Fullness   There is pain in the left ear. This is a chronic problem. The problem has been gradually worsening. There has been no fever. The patient is experiencing no pain. Associated symptoms include hearing loss and rhinorrhea. Pertinent negatives include no abdominal pain, coughing, ear discharge, headaches, neck pain, rash, sore throat or vomiting. She has tried nothing for the symptoms. Her past medical history is significant for hearing loss.   She has not noted dizziness or headache.   Also c/o left DIP 5th finger pain, it has happened for "a while", she wonders if she may have a fracture. She denies Hx of recent trauma. She denies significant limitation of movement. She has not noted any edema or erythema. She has not tried anything for pain.    Review of Systems  Constitutional: Negative for appetite change, chills, fatigue and fever.  HENT: Positive for hearing loss, postnasal  drip, rhinorrhea and tinnitus. Negative for dental problem, ear discharge, sore throat, trouble swallowing and voice change.   Eyes: Negative for redness and visual disturbance.  Respiratory: Negative for cough, shortness of breath and wheezing.   Cardiovascular: Negative for palpitations.  Gastrointestinal: Negative for abdominal pain, nausea and vomiting.  Endocrine: Negative for cold intolerance and heat intolerance.  Musculoskeletal: Positive for arthralgias. Negative for gait problem and neck pain.  Skin: Negative for pallor and rash.  Allergic/Immunologic: Positive for environmental allergies.  Neurological: Negative for dizziness, tremors, facial asymmetry, weakness and headaches.  Psychiatric/Behavioral: Negative for confusion. The patient is nervous/anxious.      Current Outpatient Prescriptions on File Prior to Visit  Medication Sig Dispense Refill  . Calcium-Magnesium-Vitamin D (CALCIUM MAGNESIUM PO) Take 4 tablets by mouth daily.     . clonazePAM (KLONOPIN) 1 MG tablet Take 1 tablet (1 mg total) by mouth daily. 90 tablet 1  . metoprolol succinate (TOPROL-XL) 25 MG 24 hr tablet Take 0.5 tablets (12.5 mg total) by mouth 2 (two) times daily. (Patient taking differently: Take 12.5 mg by mouth at bedtime. Patient takes only at hs) 100 tablet 3  . Probiotic Product (ALIGN) 4 MG CAPS Take 1 capsule by mouth daily.    . Pyridoxine HCl (VITAMIN B-6 PO) Take 1 tablet by mouth daily.    . TURMERIC PO Take 3 tablets by mouth daily.     . [DISCONTINUED] estrogen, conjugated,-medroxyprogesterone (PREMPRO) 0.625-2.5 MG per tablet Take 1 tablet by mouth daily. [place]  tablet 11   No current facility-administered medications on file prior to visit.      Past Medical History:  Diagnosis Date  . ALLERGIC RHINITIS 07/03/2008  . ANXIETY 06/02/2007  . Arthritis   . BELL'S PALSY, LEFT 12/02/2008  . Cancer ([place])   . CELLULITIS, LEFT LEG 09/14/2008  . CHEST PAIN 07/07/2009  . Complication of anesthesia  08-28-2014   "work up in recovery room with jaw locked open"  . CORNS AND CALLUSES 07/03/2008  . Depression   . FOOT PAIN 09/22/2009  . IRREGULAR MENSES 06/03/2008  . Neoplasm    left renal  . OCD (obsessive compulsive disorder)   . Stress fracture of ankle since Jun 29, 2014   left  . Supraventricular tachycardia ([place]) 06/02/2007   Atrial tachycardia with Wenckebach periodicity   Allergies  Allergen Reactions  . Latex     REACTION: rash  . Amoxicillin Rash    Has patient had a PCN reaction causing immediate rash, facial/tongue/throat swelling, SOB or lightheadedness with hypotension: unkown Has patient had a PCN reaction causing severe rash involving mucus membranes or skin necrosis: no Has patient had a PCN reaction that required hospitalization No Has patient had a PCN reaction occurring within the last 10 years: unknown If all of the above answers are "NO", then may proceed with Cephalosporin use.    Social History   Social History  . Marital status: Married    Spouse name: N/A  . Number of children: N/A  . Years of education: N/A   Occupational History  . 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 Smoking status: Never Smoker  . Smokeless tobacco: Never Used  . Alcohol use Yes     Comment: social  . Drug use: No  . Sexual activity: Yes     Comment: married   Other Topics Concern  . None   Social History Narrative   No caffeine     Vitals:   07/20/16 1036  BP: 100/70  Pulse: 62  Resp: 12  O2 sat 96% at RA. Body mass index is 20.74 kg/m.   Physical Exam  Nursing note and vitals reviewed. Constitutional: She is oriented to person, place, and time. She appears well-developed and well-nourished. She does not appear ill. No distress.  HENT:  Head: Atraumatic.  Right Ear: Tympanic membrane, external ear and ear canal normal.  Left Ear: Tympanic membrane, external ear and ear canal normal.  Nose: Right sinus exhibits no maxillary  sinus tenderness and no frontal sinus tenderness. Left sinus exhibits no maxillary sinus tenderness and no frontal sinus tenderness.  Mouth/Throat: Oropharynx is clear and moist and mucous membranes are normal.  Eyes: Conjunctivae and EOM are normal. Pupils are equal, round, and reactive to light.  Neck: Carotid bruit is not present.  Cardiovascular: Normal rate and regular rhythm.   No murmur heard. Respiratory: Effort normal and breath sounds normal. No respiratory distress.  Musculoskeletal:  Nodules on some IP joints bilateral, more noticeable on 5th left DIP. No signs of synovitis appreciated.  Lymphadenopathy:       Head (right side): No submandibular adenopathy present.       Head (left side): No submandibular adenopathy present.    She has no cervical adenopathy.  Neurological: She is alert and oriented to person, place, and time. She has normal strength. No cranial nerve deficit. Coordination and gait normal.  Skin: Skin is warm. No rash noted. No erythema.  Psychiatric: Her  speech is normal. Her mood appears anxious.  Well groomed, good eye contact.     ASSESSMENT AND PLAN:   Dezarae was seen today for tinnitus.  Diagnoses and all orders for this visit:  Bilateral tinnitus  Educated about diagnosis and possible etiologies, explained that most of the time is benign and chronic. She is very anxious about these and would like to be evaluated by ENT.  -     Ambulatory referral to ENT  Hearing loss of left ear, unspecified hearing loss type  Mild left hearing loss on screening audiogram: 500 and 4000 Dz ? Eustachian tube dysfunction could be contributing to problem.  -     Ambulatory referral to ENT  Arthralgia of left hand  Most likely OA, reassured. I do not think imaging is needed at this time since there is no Hx of trauma. Topical OTC medications or Tylenol if needed. Daily Tumeric caps bis may also help.   Anxiety state  Not well controlled. She is  really concerned about Wellbutrin causing tinnitus, I recommend waiting for ENT evaluation fists but she prefers trying to stop medication. So recommend decreasing Wellbutrin XR dose from 300 mg 150 mg, she just filled the prescription, so she was instructed to split the tablet in half. She will monitor for changes, follow-up in 3 months. She still takes Clonazepam, no changes for now.  -     buPROPion (WELLBUTRIN XL) 300 MG 24 hr tablet; Take 1 tablet (300 mg total) by mouth daily.    -[name] was advised to seek immediate medical attention is symptoms suddenly get worse, otherwise I will see her back in 3 months.       Caison Hearn G. Martinique, MD  [facility]. [place] office.

## 2016-07-28 DIAGNOSIS — B9620 Unspecified Escherichia coli [E. coli] as the cause of diseases classified elsewhere: Secondary | ICD-10-CM | POA: Diagnosis not present

## 2016-07-28 DIAGNOSIS — N390 Urinary tract infection, site not specified: Secondary | ICD-10-CM | POA: Diagnosis not present

## 2016-07-28 DIAGNOSIS — R300 Dysuria: Secondary | ICD-10-CM | POA: Diagnosis not present

## 2016-08-09 DIAGNOSIS — H9313 Tinnitus, bilateral: Secondary | ICD-10-CM | POA: Diagnosis not present

## 2016-08-09 DIAGNOSIS — H903 Sensorineural hearing loss, bilateral: Secondary | ICD-10-CM | POA: Diagnosis not present

## 2016-08-12 ENCOUNTER — Ambulatory Visit (HOSPITAL_COMMUNITY)
Admission: RE | Admit: 2016-08-12 | Discharge: 2016-08-12 | Disposition: A | Discharge status: Home / Self Care | Payer: BLUE CROSS/BLUE SHIELD | Source: Ambulatory Visit | Attending: Urology | Admitting: Urology

## 2016-08-12 ENCOUNTER — Other Ambulatory Visit (HOSPITAL_COMMUNITY): Payer: Self-pay | Admitting: Urology

## 2016-08-12 DIAGNOSIS — Z85528 Personal history of other malignant neoplasm of kidney: Secondary | ICD-10-CM

## 2016-08-12 DIAGNOSIS — J984 Other disorders of lung: Secondary | ICD-10-CM | POA: Diagnosis not present

## 2016-08-12 DIAGNOSIS — R918 Other nonspecific abnormal finding of lung field: Secondary | ICD-10-CM | POA: Diagnosis not present

## 2016-08-12 DIAGNOSIS — N390 Urinary tract infection, site not specified: Secondary | ICD-10-CM | POA: Diagnosis not present

## 2016-08-12 DIAGNOSIS — B9620 Unspecified Escherichia coli [E. coli] as the cause of diseases classified elsewhere: Secondary | ICD-10-CM | POA: Diagnosis not present

## 2016-08-30 DIAGNOSIS — Z85528 Personal history of other malignant neoplasm of kidney: Secondary | ICD-10-CM | POA: Diagnosis not present

## 2016-08-30 DIAGNOSIS — R300 Dysuria: Secondary | ICD-10-CM | POA: Diagnosis not present

## 2016-09-27 ENCOUNTER — Encounter: Payer: Self-pay | Admitting: Physician Assistant

## 2016-09-27 ENCOUNTER — Ambulatory Visit (INDEPENDENT_AMBULATORY_CARE_PROVIDER_SITE_OTHER): Payer: BLUE CROSS/BLUE SHIELD | Admitting: Physician Assistant

## 2016-09-27 ENCOUNTER — Ambulatory Visit (INDEPENDENT_AMBULATORY_CARE_PROVIDER_SITE_OTHER): Payer: BLUE CROSS/BLUE SHIELD

## 2016-09-27 VITALS — BP 132/71 | HR 106 | Temp 98.0°F | Resp 16 | Ht 67.0 in | Wt 135.0 lb

## 2016-09-27 DIAGNOSIS — S299XXA Unspecified injury of thorax, initial encounter: Secondary | ICD-10-CM | POA: Diagnosis not present

## 2016-09-27 DIAGNOSIS — R0781 Pleurodynia: Secondary | ICD-10-CM

## 2016-09-27 DIAGNOSIS — S2232XA Fracture of one rib, left side, initial encounter for closed fracture: Secondary | ICD-10-CM

## 2016-09-27 MED ORDER — HYDROCODONE-ACETAMINOPHEN 5-325 MG PO TABS: 1.0000 | ORAL_TABLET | Freq: Four times a day (QID) | ORAL | 0 refills | Status: DC | PRN

## 2016-09-27 NOTE — Patient Instructions (Addendum)
Please hydrate well with water. Avoid strenuous exercise, or collision sport at this time. Take tylenol.  Avoid ibuprofen and naproxen at this time. Rib Fracture A rib fracture is a break or crack in one of the bones of the ribs. The ribs are like a cage that goes around your upper chest. A broken or cracked rib is often painful, but most do not cause other problems. Most rib fractures heal on their own in 1-3 months. Follow these instructions at home:  Avoid activities that cause pain to the injured area. Protect your injured area.  Slowly increase activity as told by your doctor.  Take medicine as told by your doctor.  Put ice on the injured area for the first 1-2 days after you have been treated or as told by your doctor. ? Put ice in a plastic bag. ? Place a towel between your skin and the bag. ? Leave the ice on for 15-20 minutes at a time, every 2 hours while you are awake.  Do deep breathing as told by your doctor. You may be told to: ? Take deep breaths many times a day. ? Cough many times a day while hugging a pillow. ? Use a device (incentive spirometer) to perform deep breathing many times a day.  Drink enough fluids to keep your pee (urine) clear or pale yellow.  Do not wear a rib belt or binder. These do not allow you to breathe deeply. Get help right away if:  You have a fever.  You have trouble breathing.  You cannot stop coughing.  You cough up thick or bloody spit (mucus).  You feel sick to your stomach (nauseous), throw up (vomit), or have belly (abdominal) pain.  Your pain gets worse and medicine does not help. This information is not intended to replace advice given to you by your health care provider. Make sure you discuss any questions you have with your health care provider. Document Released: 10/28/2007 Document Revised: 06/26/2015 Document Reviewed: 03/22/2012 Elsevier Interactive Patient Education  2018 [employer].     IF you received an  x-ray today, you will receive an invoice from [facility] Rad[facility]. Please contact [facility] Radiology at [phone] with questions or concerns regarding your invoice.   IF you received labwork today, you will receive an invoice from [facility]. Please contact [facility] at [phone] with questions or concerns regarding your invoice.   Our billing staff will not be able to assist you with questions regarding bills from these companies.  You will be contacted with the lab results as soon as they are available. The fastest way to get your results is to activate your My Chart account. Instructions are located on the last page of this paperwork. If you have not heard from [place] regarding the results in 2 weeks, please contact this office.

## 2016-09-27 NOTE — Progress Notes (Signed)
PRIMARY CARE AT [place], [place] [address] [phone]  Date:  09/27/2016   Name:  Shelby Grant   DOB:  05-15-1955   MRN:  [id number]  PCP:  Grant, Shelby G, MD    History of Present Illness:  Shelby Grant is a 61 y.o. female patient who presents to PCP with  Chief Complaint  Patient presents with  . Fall    thursday/ pt hurt her ribs     4 days ago, patient was horseplaying where she fell and hit her rib on the side of the couch.    Pain has progressively worsened with time.  She has no sob or dyspnea.  Great pain with coughing, or sneezing.  She has not taken anyting for pain She reports normal urination without hematuria.  No bruising. Patient Active Problem List   Diagnosis Date Noted  . Colon cancer screening 12/04/2015  . Renal neoplasm 01/06/2015  . Renal lesion 10/15/2014  . Bruising 07/08/2011  . Corn 05/11/2011  . Abdominal pain 05/11/2011  . Menopause syndrome 02/11/2011  . Cervical polyp 06/08/2010  . History of Bell's palsy 12/02/2008  . ALLERGIC RHINITIS 07/03/2008  . BREAST MASS, RIGHT 06/03/2008  . Supraventricular tachycardia ([place]) 06/02/2007  . Generalized anxiety disorder 06/02/2007    Past Medical History:  Diagnosis Date  . ALLERGIC RHINITIS 07/03/2008  . ANXIETY 06/02/2007  . Arthritis   . BELL'S PALSY, LEFT 12/02/2008  . Cancer ([place])   . CELLULITIS, LEFT LEG 09/14/2008  . CHEST PAIN 07/07/2009  . Complication of anesthesia 08-28-2014   "work up in recovery room with jaw locked open"  . CORNS AND CALLUSES 07/03/2008  . Depression   . FOOT PAIN 09/22/2009  . IRREGULAR MENSES 06/03/2008  . Neoplasm    left renal  . OCD (obsessive compulsive disorder)   . Stress fracture of ankle since Jun 29, 2014   left  . Supraventricular tachycardia ([place]) 06/02/2007   Atrial tachycardia with Wenckebach periodicity    Past Surgical History:  Procedure Laterality Date  . LAPAROSCOPIC APPENDECTOMY N/A 08/28/2014   Procedure: APPENDECTOMY  LAPAROSCOPIC;  Surgeon: Judeth Horn, MD;  Location: [facility];  Service: General;  Laterality: N/A;  . ROBOTIC ASSITED PARTIAL NEPHRECTOMY Left 01/06/2015   Procedure: ROBOTIC ASSISTED PARTIAL NEPHRECTOMY;  Surgeon: Raynelle Bring, MD;  Location: [facility];  Service: Urology;  Laterality: Left;  . WISDOM TOOTH EXTRACTION      Social History  Substance Use Topics  . Smoking status: Never Smoker  . Smokeless tobacco: Never Used  . Alcohol use Yes     Comment: social    Family History  Problem Relation Age of Onset  . Asthma Sister   . Cancer Mother        Shelby Grant where cancer started     Allergies  Allergen Reactions  . Latex     REACTION: rash  . Amoxicillin Rash    Has patient had a PCN reaction causing immediate rash, facial/tongue/throat swelling, SOB or lightheadedness with hypotension: unkown Has patient had a PCN reaction causing severe rash involving mucus membranes or skin necrosis: no Has patient had a PCN reaction that required hospitalization No Has patient had a PCN reaction occurring within the last 10 years: unknown If all of the above answers are "NO", then may proceed with Cephalosporin use.    Medication list has been reviewed and updated.  Current Outpatient Prescriptions on File Prior to Visit  Medication Sig Dispense Refill  .  buPROPion (WELLBUTRIN XL) 300 MG 24 hr tablet Take 1 tablet (300 mg total) by mouth daily. 90 tablet 1  . Calcium-Magnesium-Vitamin D (CALCIUM MAGNESIUM PO) Take 4 tablets by mouth daily.     . clonazePAM (KLONOPIN) 1 MG tablet Take 1 tablet (1 mg total) by mouth daily. 90 tablet 1  . metoprolol succinate (TOPROL-XL) 25 MG 24 hr tablet Take 0.5 tablets (12.5 mg total) by mouth 2 (two) times daily. (Patient taking differently: Take 12.5 mg by mouth at bedtime. Patient takes only at hs) 100 tablet 3  . Pyridoxine HCl (VITAMIN B-6 PO) Take 1 tablet by mouth daily.    . TURMERIC PO Take 3 tablets by mouth daily.     . Probiotic Product (ALIGN) 4  MG CAPS Take 1 capsule by mouth daily.    . [DISCONTINUED] estrogen, conjugated,-medroxyprogesterone (PREMPRO) 0.625-2.5 MG per tablet Take 1 tablet by mouth daily. 28 tablet 11   No current facility-administered medications on file prior to visit.     ROS ROS otherwise unremarkable unless listed above.  Physical Examination: BP 132/71   Pulse (!) 106   Temp 98 F (36.7 C) (Oral)   Resp 16   Ht 5\' 7"  (1.702 m)   Wt 135 lb (61.2 kg)   LMP 08/01/2012   SpO2 98%   BMI 21.14 kg/m  Ideal Body Weight: Weight in (lb) to have BMI = 25: 159.3  Physical Exam  Constitutional: She is oriented to person, place, and time. She appears well-developed and well-nourished. No distress.  HENT:  Head: Normocephalic and atraumatic.  Right Ear: External ear normal.  Left Ear: External ear normal.  Eyes: Pupils are equal, round, and reactive to light. Conjunctivae and EOM are normal.  Cardiovascular: Normal rate.   Pulmonary/Chest: Effort normal. No respiratory distress. She has no decreased breath sounds. She has no wheezes.  Musculoskeletal:  Tender left anterior costal border pain and at the adjacent rib, this is present along the anterior to axillary and minimal with posterior along same plane.  No crepitus  Neurological: She is alert and oriented to person, place, and time.  Skin: She is not diaphoretic.  No ecchymosis  Psychiatric: She has a normal mood and affect. Her behavior is normal.   Dg Ribs Unilateral W/chest Left  Result Date: 09/27/2016 CLINICAL DATA:  Left rib pain after falling. EXAM: LEFT RIBS AND CHEST - 3+ VIEW COMPARISON:  Chest x-ray dated August 12, 2016. FINDINGS: Possible nondisplaced fracture of the left anterior tenth rib. There is no evidence of pneumothorax or pleural effusion. Both lungs are clear. Heart size and mediastinal contours are within normal limits. IMPRESSION: Possible nondisplaced fracture of the left anterior tenth rib. Electronically Signed   By: Titus Dubin M.D.   On: 09/27/2016 15:19    Assessment and Plan: Shelby Grant is a 61 y.o. female who is here today for cc of rib pain This is possible, and given nature of injury and physical exam--it is likely Advised icing, pillow for coughing sneezing.  Tylenol Discussed alarming sxs to warrant immediate visit to the ED or here Closed fracture of one rib of left side, initial encounter - Plan: HYDROcodone-acetaminophen (NORCO) 5-325 MG tablet  Rib pain on right side  Rib pain on left side - Plan: POCT urinalysis dipstick, DG Ribs Unilateral W/Chest Left, HYDROcodone-acetaminophen (NORCO) 5-325 MG tablet  Ivar Drape, PA-C [facility] 9/2/20187:47 AM

## 2016-10-21 ENCOUNTER — Encounter: Payer: Self-pay | Admitting: Family Medicine

## 2016-11-01 ENCOUNTER — Encounter: Payer: Self-pay | Admitting: Urgent Care

## 2016-11-01 ENCOUNTER — Ambulatory Visit (INDEPENDENT_AMBULATORY_CARE_PROVIDER_SITE_OTHER): Payer: BLUE CROSS/BLUE SHIELD | Admitting: Urgent Care

## 2016-11-01 VITALS — BP 130/80 | HR 94 | Temp 98.2°F | Resp 16 | Ht 67.0 in | Wt 135.2 lb

## 2016-11-01 DIAGNOSIS — R21 Rash and other nonspecific skin eruption: Secondary | ICD-10-CM | POA: Diagnosis not present

## 2016-11-01 DIAGNOSIS — L089 Local infection of the skin and subcutaneous tissue, unspecified: Secondary | ICD-10-CM | POA: Diagnosis not present

## 2016-11-01 MED ORDER — CEPHALEXIN 500 MG PO CAPS: 500.0000 mg | ORAL_CAPSULE | Freq: Three times a day (TID) | ORAL | 0 refills | Status: DC

## 2016-11-01 NOTE — Patient Instructions (Addendum)
Cellulitis, Adult Cellulitis is a skin infection. The infected area is usually red and sore. This condition occurs most often in the arms and lower legs. It is very important to get treated for this condition. Follow these instructions at home:  Take over-the-counter and prescription medicines only as told by your doctor.  If you were prescribed an antibiotic medicine, take it as told by your doctor. Do not stop taking the antibiotic even if you start to feel better.  Drink enough fluid to keep your pee (urine) clear or pale yellow.  Do not touch or rub the infected area.  Raise (elevate) the infected area above the level of your heart while you are sitting or lying down.  Place warm or cold wet cloths (warm or cold compresses) on the infected area. Do this as told by your doctor.  Keep all follow-up visits as told by your doctor. This is important. These visits let your doctor make sure your infection is not getting worse. Contact a doctor if:  You have a fever.  Your symptoms do not get better after 1-2 days of treatment.  Your bone or joint under the infected area starts to hurt after the skin has healed.  Your infection comes back. This can happen in the same area or another area.  You have a swollen bump in the infected area.  You have new symptoms.  You feel ill and also have muscle aches and pains. Get help right away if:  Your symptoms get worse.  You feel very sleepy.  You throw up (vomit) or have watery poop (diarrhea) for a long time.  There are red streaks coming from the infected area.  Your red area gets larger.  Your red area turns darker. This information is not intended to replace advice given to you by your health care provider. Make sure you discuss any questions you have with your health care provider. Document Released: 07/07/2007 Document Revised: 06/26/2015 Document Reviewed: 11/27/2014 Elsevier Interactive Patient Education  2018 [employer].     IF you received an x-ray today, you will receive an invoice from [facility] Radiology. Please contact [facility] Radiology at [phone] with questions or concerns regarding your invoice.   IF you received labwork today, you will receive an invoice from [place]. Please contact [facility] at [phone] with questions or concerns regarding your invoice.   Our billing staff will not be able to assist you with questions regarding bills from these companies.  You will be contacted with the lab results as soon as they are available. The fastest way to get your results is to activate your My Chart account. Instructions are located on the last page of this paperwork. If you have not heard from [place] regarding the results in 2 weeks, please contact this office.

## 2016-11-01 NOTE — Progress Notes (Signed)
  MRN: [id number] DOB: [birth year]  Subjective:   Shelby Grant is a 61 y.o. female presenting for chief complaint of Insect Bite (on left ankle)  Reports 2 week history of persistent lesion over her left ankle. Patient states that it was initially like a blister, felt it develop irritation after she suspected an insect sting/bite. Has had persistent sore since then with redness and now having some swelling and mild pain. Denies fever, drainage of pus or bleeding, red streaks, itching. Has tried peroxide, Epsom salts.   Shelby Grant has a current medication list which includes the following prescription(s): bupropion, calcium-magnesium-vitamin d, clonazepam, metoprolol succinate, align, pyridoxine hcl, and turmeric. Also is allergic to latex and amoxicillin.  Shelby Grant  has a past medical history of ALLERGIC RHINITIS (07/03/2008); ANXIETY (06/02/2007); Arthritis; BELL'S PALSY, LEFT (12/02/2008); Cancer ([place]); CELLULITIS, LEFT LEG (09/14/2008); CHEST PAIN (07/07/2009); Complication of anesthesia (08-28-2014); CORNS AND CALLUSES (07/03/2008); Depression; FOOT PAIN (09/22/2009); IRREGULAR MENSES (06/03/2008); Neoplasm; OCD (obsessive compulsive disorder); Stress fracture of ankle (since Jun 29, 2014); and Supraventricular tachycardia ([place]) (06/02/2007). Also  has a past surgical history that includes Wisdom tooth extraction; laparoscopic appendectomy (N/A, 08/28/2014); and Robotic assited partial nephrectomy (Left, 01/06/2015).  Objective:   Vitals: BP 130/80   Pulse 94   Temp 98.2 F (36.8 C) (Oral)   Resp 16   Ht 5\' 7"  (1.702 m)   Wt 135 lb 3.2 oz (61.3 kg)   LMP 08/01/2012   SpO2 96%   BMI 21.18 kg/m   Physical Exam  Constitutional: She is oriented to person, place, and time. She appears well-developed and well-nourished.  Cardiovascular: Normal rate.   Pulmonary/Chest: Effort normal.  Neurological: She is alert and oriented to person, place, and time.  Skin: Skin is warm and dry.         Assessment and  Plan :   1. Rash and nonspecific skin eruption 2. Superficial skin infection - Will cover for superficial skin infection secondary to what may have initially been a contact dermatitis. Return-to-clinic precautions discussed, patient verbalized understanding.  - cephALEXin (KEFLEX) 500 MG capsule; Take 1 capsule (500 mg total) by mouth 3 (three) times daily.  Dispense: 21 capsule; Refill: 0   Jaynee Eagles, PA-C Primary Care at [place] [phone] 11/01/2016  4:05 PM

## 2016-11-15 ENCOUNTER — Encounter (HOSPITAL_COMMUNITY): Payer: Self-pay

## 2016-11-15 ENCOUNTER — Ambulatory Visit (HOSPITAL_COMMUNITY)
Admission: RE | Admit: 2016-11-15 | Discharge: 2016-11-15 | Disposition: A | Discharge status: Home / Self Care | Payer: BLUE CROSS/BLUE SHIELD | Source: Ambulatory Visit | Attending: Family Medicine | Admitting: Family Medicine

## 2016-11-15 ENCOUNTER — Encounter: Payer: Self-pay | Admitting: Family Medicine

## 2016-11-15 ENCOUNTER — Ambulatory Visit (INDEPENDENT_AMBULATORY_CARE_PROVIDER_SITE_OTHER): Payer: BLUE CROSS/BLUE SHIELD

## 2016-11-15 ENCOUNTER — Ambulatory Visit (INDEPENDENT_AMBULATORY_CARE_PROVIDER_SITE_OTHER): Payer: BLUE CROSS/BLUE SHIELD | Admitting: Family Medicine

## 2016-11-15 VITALS — BP 120/68 | HR 68 | Temp 98.0°F | Resp 16 | Ht 67.0 in | Wt 136.6 lb

## 2016-11-15 DIAGNOSIS — W19XXXA Unspecified fall, initial encounter: Secondary | ICD-10-CM

## 2016-11-15 DIAGNOSIS — S62115A Nondisplaced fracture of triquetrum [cuneiform] bone, left wrist, initial encounter for closed fracture: Secondary | ICD-10-CM

## 2016-11-15 DIAGNOSIS — R0789 Other chest pain: Secondary | ICD-10-CM

## 2016-11-15 DIAGNOSIS — R072 Precordial pain: Secondary | ICD-10-CM | POA: Diagnosis not present

## 2016-11-15 DIAGNOSIS — M25532 Pain in left wrist: Secondary | ICD-10-CM

## 2016-11-15 DIAGNOSIS — S299XXA Unspecified injury of thorax, initial encounter: Secondary | ICD-10-CM | POA: Diagnosis not present

## 2016-11-15 DIAGNOSIS — S6992XA Unspecified injury of left wrist, hand and finger(s), initial encounter: Secondary | ICD-10-CM | POA: Diagnosis not present

## 2016-11-15 DIAGNOSIS — R079 Chest pain, unspecified: Secondary | ICD-10-CM | POA: Diagnosis not present

## 2016-11-15 LAB — CREATININE, SERUM
Creatinine, Ser: 0.85 mg/dL (ref 0.44–1.00)
GFR calc non Af Amer: >60 mL/min (ref >60)

## 2016-11-15 MED ORDER — IOPAMIDOL (ISOVUE-300) INJECTION 61%
INTRAVENOUS | Status: AC
  Administered 2016-11-15: 75 mL
  Filled 2016-11-15: qty 75

## 2016-11-15 MED ORDER — HYDROCODONE-ACETAMINOPHEN 5-325 MG PO TABS: 1.0000 | ORAL_TABLET | Freq: Four times a day (QID) | ORAL | 0 refills | Status: DC | PRN

## 2016-11-15 NOTE — Progress Notes (Signed)
Subjective:  This chart was scribed for Shelby Agreste, MD by Tamsen Roers, at [place] at [facility].  This patient was seen in room 12 and the patient's care was started at 12:04 PM.   Chief Complaint  Patient presents with  . Fall    fell last night down 2 steps outside  . Chest Pain    from falling  . Wrist Pain    left wrist pain from fall  . Leg Pain    left leg from fall     Patient ID: Shelby Grant, female    DOB: 1955-03-30, 61 y.o.   MRN: [id number]  HPI HPI Comments: Shelby Grant is a 61 y.o. female who presents to Primary Care at [facility] after a fall last night around 6:30 pm.  She is complaining of central chest pain, left wrist/upper arm pain and left leg pain after falling on concrete after a misstep on concrete stairs (3 steps down).   Patient thinks that she may have hit her chest on the concrete. She denies hearing a crack or a pop during the incident.  She is having increased pain when taking deep breaths and is having chest pains even while in a seated position.  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  Patient states that they were decorator steps, (not very high) and was able to ambulate afterwards.  She is not able to recall if she hit her head during the fall and states that she was "out of it" for a second afterwards.  Patient did not call an ambulance. She has iced her wrist/chest and took 2 Advil initially, 3 more around midnight.  She has not taken any medications this morning.  Denies any history of peptic ulcers or chronic kidney disease. Patient denies any history of heart problems. She is right hand dominant.   She was treated August 27 th after an accidental fall on right ribs.  She had a possible non displaced fracture left anterior tenth rib.  Treated with hydrocodone at that time. Denies any problems with falls recently.     Patient Active Problem List    Diagnosis Date Noted  . Colon cancer screening 12/04/2015  . Renal neoplasm 01/06/2015  . Renal lesion 10/15/2014  . Bruising 07/08/2011  . Corn 05/11/2011  . Abdominal pain 05/11/2011  . Menopause syndrome 02/11/2011  . Cervical polyp 06/08/2010  . History of Bell's palsy 12/02/2008  . ALLERGIC RHINITIS 07/03/2008  . BREAST MASS, RIGHT 06/03/2008  . Supraventricular tachycardia ([place]) 06/02/2007  . Generalized anxiety disorder 06/02/2007   Past Medical History:  Diagnosis Date  . ALLERGIC RHINITIS 07/03/2008  . ANXIETY 06/02/2007  . Arthritis   . BELL'S PALSY, LEFT 12/02/2008  . Cancer ([place])   . CELLULITIS, LEFT LEG 09/14/2008  . CHEST PAIN 07/07/2009  . Complication of anesthesia 08-28-2014   "work up in recovery room with jaw locked open"  . CORNS AND CALLUSES 07/03/2008  . Depression   . FOOT PAIN 09/22/2009  . IRREGULAR MENSES 06/03/2008  . Neoplasm    left renal  . OCD (obsessive compulsive disorder)   . Stress fracture of ankle since Jun 29, 2014   left  . Supraventricular tachycardia ([place]) 06/02/2007   Atrial tachycardia with Wenckebach periodicity   Past Surgical History:  Procedure Laterality Date  . LAPAROSCOPIC APPENDECTOMY N/A 08/28/2014   Procedure: APPENDECTOMY LAPAROSCOPIC;  Surgeon: Judeth Horn, MD;  Location: [facility] OR;  Service: General;  Laterality: N/A;  . ROBOTIC ASSITED PARTIAL NEPHRECTOMY Left 01/06/2015   Procedure: ROBOTIC ASSISTED PARTIAL NEPHRECTOMY;  Surgeon: Raynelle Bring, MD;  Location: [facility] ORS;  Service: Urology;  Laterality: Left;  . WISDOM TOOTH EXTRACTION     Allergies  Allergen Reactions  . Latex     REACTION: rash  . Amoxicillin Rash    Has patient had a PCN reaction causing immediate rash, facial/tongue/throat swelling, SOB or lightheadedness with hypotension: unkown Has patient had a PCN reaction causing severe rash involving mucus membranes or skin necrosis: no Has patient had a PCN reaction that required hospitalization No Has patient had a  PCN reaction occurring within the last 10 years: unknown If all of the above answers are "NO", then may proceed with Cephalosporin use.   Prior to Admission medications   Medication Sig Start Date End Date Taking? Authorizing Provider  buPROPion (WELLBUTRIN XL) 300 MG 24 hr tablet Take 1 tablet (300 mg total) by mouth daily. 07/20/16  Yes Martinique, Betty G, MD  Calcium-Magnesium-Vitamin D (CALCIUM MAGNESIUM PO) Take 4 tablets by mouth daily.    Yes [provider]  clonazePAM (KLONOPIN) 1 MG tablet Take 1 tablet (1 mg total) by mouth daily. 06/25/16  Yes Dorena Cookey, MD  metoprolol succinate (TOPROL-XL) 25 MG 24 hr tablet Take 0.5 tablets (12.5 mg total) by mouth 2 (two) times daily. Patient taking differently: Take 12.5 mg by mouth at bedtime. Patient takes only at hs 12/03/14  Yes Dorena Cookey, MD  Probiotic Product (ALIGN) 4 MG CAPS Take 1 capsule by mouth daily.   Yes [provider]  Pyridoxine HCl (VITAMIN B-6 PO) Take 1 tablet by mouth daily.   Yes [provider]  TURMERIC PO Take 3 tablets by mouth daily.    Yes [provider]   Social History   Social History  . Marital status: Married    Spouse name: N/A  . Number of children: N/A  . Years of education: N/A   Occupational History  . 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 Smoking status: Never Smoker  . Smokeless tobacco: Never Used  . Alcohol use Yes     Comment: social  . Drug use: No  . Sexual activity: Yes     Comment: married   Other Topics Concern  . Not on file   Social History Narrative   No caffeine      Review of Systems  Constitutional: Negative for chills and fever.  Eyes: Negative for pain, redness and itching.  Respiratory: Negative for cough and choking.   Gastrointestinal: Negative for nausea and vomiting.  Musculoskeletal:       Chest, wrist and arm pain  Neurological: Negative for syncope and speech difficulty.         Objective:   Physical Exam  Constitutional: She is oriented to person, place, and time. She appears well-developed and well-nourished. No distress.  HENT:  Head: Normocephalic and atraumatic.  Pulmonary/Chest: No respiratory distress.  Minimal tenderness over the lower sternum. primarily mid sternum tenderness.  Lateral ribs non tender.    Musculoskeletal:  Head is non tender, c spine- no midline bony tenderness. Pain free c-spine range of motion.  Pain free ROM left shoulder. [place] clavicle and AC are non tender. Left arm: upper humerus non tender.  Slight tenderness Along the distal lateral upper arm at the distal humerus. Left elbow exam: pain free  ROM of left elbow, epicondyle, olecranon, and radial head non tender. Left wrist: small abrasion over the radial styloid, slight erythema over the ulnar styloid. Scaphoid tenderness, dorsal tenderness over her proximal carpal rib-ulnar aspect. Pain with radial deviation, slight pain with ulnar deviation.  Pain free ROM of left and right knee hip and ankle.   Neurological: She is alert and oriented to person, place, and time.  Skin: Skin is warm and dry.  small superficial abrasion left anterior lower leg surface.   Psychiatric: She has a normal mood and affect. Her behavior is normal.   Vitals:   11/15/16 1139  BP: 120/68  Pulse: 68  Resp: 16  Temp: 98 F (36.7 C)  TempSrc: Oral  SpO2: 98%  Weight: 136 lb 9.6 oz (62 kg)  Height: 5\' 7"  (1.702 m)       Assessment & Plan:    Shelby Grant is a 61 y.o. female Fall, initial encounter - Plan: DG Wrist Complete Left, DG Sternum, CT Chest W Contrast Other chest pain - Plan: CT Chest W Contrast, I-Stat Creatinine, Creatinine, serum Sternal pain - Plan: DG Sternum, CT Chest W Contrast, HYDROcodone-acetaminophen (NORCO/VICODIN) 5-325 MG tablet  -Midsternal chest pain after fall onto possible blocks. No apparent fracture on x-ray, she does not have any distal clavicular tenderness, so doubt  fracture with possible irregularity noted on x-ray  -CT chest obtained without apparent bony injury of chest or surrounding hematoma.  -Continue ibuprofen as needed, hydrocodone provided if more severe pain, with side effects discussed.    -ER/RTC precautions.  Wrist pain, left - Plan: DG Wrist Complete Left, Thumb spica, HYDROcodone-acetaminophen (NORCO/VICODIN) 5-325 MG tablet, Ambulatory referral to Hand Surgery, CANCELED: Ambulatory referral to Hand Surgery Closed nondisplaced fracture of triquetrum of left wrist, initial encounter - Plan: Ambulatory referral to Hand Surgery, CANCELED: Ambulatory referral to Hand Surgery  -Pain at multiple areas of wrist, including scaphoid, but there does appear to be a nondisplaced fracture of the triquetrum.  -Attempted to place a thumb spica splint, but she has a similar one at home and would like to wear that one. Advised if not same immobilization as one in office, return for placement of splint.  - Refer to hand surgeon for management.  Meds ordered this encounter  Medications  . HYDROcodone-acetaminophen (NORCO/VICODIN) 5-325 MG tablet    Sig: Take 1 tablet by mouth every 6 (six) hours as needed for moderate pain.    Dispense:  15 tablet    Refill:  0   Patient Instructions   You are scheduled for a CT at [facility] at [place]. Please arrive 15 minutes prior to your scheduled exam. Once you arrive to the hospital you will check in at the main entrance and they will guide you to the radiology department.    CT scan of chest today to look for other injuries. They did not see a sternal fracture on initial x-ray. There was a questionable area on your collarbone, but that was not sore in the office. If you have more collarbone or shoulder pain, please return for repeat imaging. Ibuprofen if needed, but for more severe pain I did write for for hydrocodone up to every 6 hours as needed. Do not combine that with other sedating medicines, especially  clonazepam.  If you are having shortness of breath or other worsening chest symptoms today, proceed to the emergency room.  Although you are having pain at the base your thumb, there also appears to be  another small wrist bone fracture (triquetrium). Wear the thumb spica splint until evaluated by orthopedics. I have placed a referral to a hand specialist.   Chest Wall Pain Chest wall pain is pain in or around the bones and muscles of your chest. Sometimes, an injury causes this pain. Sometimes, the cause may not be known. This pain may take several weeks or longer to get better. Follow these instructions at home: Pay attention to any changes in your symptoms. Take these actions to help with your pain:  Rest as told by your health care provider.  Avoid activities that cause pain. These include any activities that use your chest muscles or your abdominal and side muscles to lift heavy items.  If directed, apply ice to the painful area: ? Put ice in a plastic bag. ? Place a towel between your skin and the bag. ? Leave the ice on for 20 minutes, 2-3 times per day.  Take over-the-counter and prescription medicines only as told by your health care provider.  Do not use tobacco products, including cigarettes, chewing tobacco, and e-cigarettes. If you need help quitting, ask your health care provider.  Keep all follow-up visits as told by your health care provider. This is important.  Contact a health care provider if:  You have a fever.  Your chest pain becomes worse.  You have new symptoms. Get help right away if:  You have nausea or vomiting.  You feel sweaty or light-headed.  You have a cough with phlegm (sputum) or you cough up blood.  You develop shortness of breath. This information is not intended to replace advice given to you by your health care provider. Make sure you discuss any questions you have with your health care provider. Document Released: 01/18/2005 Document  Revised: 05/29/2015 Document Reviewed: 04/15/2014 Elsevier Interactive Patient Education  2017 [employer].    IF you received an x-ray today, you will receive an invoice from [facility]. Please contact [facility] at [phone] with questions or concerns regarding your invoice.   IF you received labwork today, you will receive an invoice from [place]. Please contact [facility] at [phone] with questions or concerns regarding your invoice.   Our billing staff will not be able to assist you with questions regarding bills from these companies.  You will be contacted with the lab results as soon as they are available. The fastest way to get your results is to activate your My Chart account. Instructions are located on the last page of this paperwork. If you have not heard from [place] regarding the results in 2 weeks, please contact this office.       I personally performed the services described in this documentation, which was scribed in my presence. The recorded information has been reviewed and considered for accuracy and completeness, addended by me as needed, and agree with information above.  Signed,   Merri Ray, MD Primary Care at [place].  11/17/16 9:56 PM

## 2016-11-15 NOTE — Patient Instructions (Addendum)
You are scheduled for a CT at [facility] at [place]. Please arrive 15 minutes prior to your scheduled exam. Once you arrive to the hospital you will check in at the main entrance and they will guide you to the radiology department.    CT scan of chest today to look for other injuries. They did not see a sternal fracture on initial x-ray. There was a questionable area on your collarbone, but that was not sore in the office. If you have more collarbone or shoulder pain, please return for repeat imaging. Ibuprofen if needed, but for more severe pain I did write for for hydrocodone up to every 6 hours as needed. Do not combine that with other sedating medicines, especially clonazepam.  If you are having shortness of breath or other worsening chest symptoms today, proceed to the emergency room.  Although you are having pain at the base your thumb, there also appears to be another small wrist bone fracture (triquetrium). Wear the thumb spica splint until evaluated by orthopedics. I have placed a referral to a hand specialist.   Chest Wall Pain Chest wall pain is pain in or around the bones and muscles of your chest. Sometimes, an injury causes this pain. Sometimes, the cause may not be known. This pain may take several weeks or longer to get better. Follow these instructions at home: Pay attention to any changes in your symptoms. Take these actions to help with your pain:  Rest as told by your health care provider.  Avoid activities that cause pain. These include any activities that use your chest muscles or your abdominal and side muscles to lift heavy items.  If directed, apply ice to the painful area: ? Put ice in a plastic bag. ? Place a towel between your skin and the bag. ? Leave the ice on for 20 minutes, 2-3 times per day.  Take over-the-counter and prescription medicines only as told by your health care provider.  Do not use tobacco products, including cigarettes, chewing tobacco,  and e-cigarettes. If you need help quitting, ask your health care provider.  Keep all follow-up visits as told by your health care provider. This is important.  Contact a health care provider if:  You have a fever.  Your chest pain becomes worse.  You have new symptoms. Get help right away if:  You have nausea or vomiting.  You feel sweaty or light-headed.  You have a cough with phlegm (sputum) or you cough up blood.  You develop shortness of breath. This information is not intended to replace advice given to you by your health care provider. Make sure you discuss any questions you have with your health care provider. Document Released: 01/18/2005 Document Revised: 05/29/2015 Document Reviewed: 04/15/2014 Elsevier Interactive Patient Education  2017 [employer].    IF you received an x-ray today, you will receive an invoice from [facility]. Please contact [facility] at [phone] with questions or concerns regarding your invoice.   IF you received labwork today, you will receive an invoice from [place]. Please contact [facility] at [phone] with questions or concerns regarding your invoice.   Our billing staff will not be able to assist you with questions regarding bills from these companies.  You will be contacted with the lab results as soon as they are available. The fastest way to get your results is to activate your My Chart account. Instructions are located on the last page of this paperwork. If you have not heard from  [place] regarding the results in 2 weeks, please contact this office.

## 2016-11-22 ENCOUNTER — Ambulatory Visit (INDEPENDENT_AMBULATORY_CARE_PROVIDER_SITE_OTHER): Payer: BLUE CROSS/BLUE SHIELD

## 2016-11-22 ENCOUNTER — Ambulatory Visit (INDEPENDENT_AMBULATORY_CARE_PROVIDER_SITE_OTHER): Payer: BLUE CROSS/BLUE SHIELD | Admitting: Family Medicine

## 2016-11-22 ENCOUNTER — Encounter: Payer: Self-pay | Admitting: Family Medicine

## 2016-11-22 VITALS — BP 102/66 | HR 69 | Temp 99.1°F | Resp 18 | Ht 67.0 in | Wt 136.8 lb

## 2016-11-22 DIAGNOSIS — S8992XA Unspecified injury of left lower leg, initial encounter: Secondary | ICD-10-CM | POA: Diagnosis not present

## 2016-11-22 DIAGNOSIS — S8012XD Contusion of left lower leg, subsequent encounter: Secondary | ICD-10-CM

## 2016-11-22 DIAGNOSIS — R0600 Dyspnea, unspecified: Secondary | ICD-10-CM

## 2016-11-22 DIAGNOSIS — S2020XA Contusion of thorax, unspecified, initial encounter: Secondary | ICD-10-CM | POA: Diagnosis not present

## 2016-11-22 DIAGNOSIS — S20219D Contusion of unspecified front wall of thorax, subsequent encounter: Secondary | ICD-10-CM | POA: Diagnosis not present

## 2016-11-22 DIAGNOSIS — M79662 Pain in left lower leg: Secondary | ICD-10-CM

## 2016-11-22 DIAGNOSIS — M25532 Pain in left wrist: Secondary | ICD-10-CM | POA: Diagnosis not present

## 2016-11-22 NOTE — Progress Notes (Signed)
Subjective:    Patient ID: Shelby Grant, female    DOB: 1955/04/17, 61 y.o.   MRN: [id number]  Katheran James as a scribe for Wendie Agreste, MD.,have documented all relevant documentation on the behalf of Jacobe Study R, MD,as directed by  Wendie Agreste, MD while in the presence of Wendie Agreste, MD.  Chief Complaint  Patient presents with  . Chest Pain    follow up   . Wrist Injury    follow up      HPI Shelby Grant is a 61 y.o. female who presents to Primary Care at [facility] complaining of the following:  Chest Pain   Associated symptoms include shortness of breath.  Wrist Injury     She was seen on 11/15/16 after fall the night prior. Diagnosed with chest contusion, left wrist pain/sprain with non-displaced fracture of triquetrum. She was having tenderness of scaphoid but no apparent initial fracture. She was in a thumb spica splint that she had at home and was referred for hand surgery. I did obtain a CT chest to rule out sternal fracture, that was unremarkable and non significant chest trauma. She was initially treated with ibuprofen as needed and a short course of hydrocodone #15 for severe pain. She had contusion to left lower leg that was not as sore as other areas at last visit  Today she notes she has an appointment with hand specialist next Monday, 10/29. She presents with home splint that is not a thumb spica splint. She previously wore it all day and did not wear it at night. This resulted in wrist pain when she woke up. Yesterday she did not wear it during the day but she slept with it at night and her wrist pain was better this morning. Since last visit she notes her chest is sore all over and she has gotten more SOB. She takes ibuprofen sporadically. She has not had stomach issues with ibuprofen currently or before. She tries to take it with food. She tries not to take the oxycodone because she does not want it to constipate her. She thinks the ibuprofen  works better than the oxycodone.  She notes still having soreness of her left lower leg from the contusion she had. She is able to walk normally. The soreness is not worse than before.     Patient Active Problem List   Diagnosis Date Noted  . Colon cancer screening 12/04/2015  . Renal neoplasm 01/06/2015  . Renal lesion 10/15/2014  . Bruising 07/08/2011  . Corn 05/11/2011  . Abdominal pain 05/11/2011  . Menopause syndrome 02/11/2011  . Cervical polyp 06/08/2010  . History of Bell's palsy 12/02/2008  . ALLERGIC RHINITIS 07/03/2008  . BREAST MASS, RIGHT 06/03/2008  . Supraventricular tachycardia ([place]) 06/02/2007  . Generalized anxiety disorder 06/02/2007   Past Medical History:  Diagnosis Date  . ALLERGIC RHINITIS 07/03/2008  . ANXIETY 06/02/2007  . Arthritis   . BELL'S PALSY, LEFT 12/02/2008  . Cancer ([place])   . CELLULITIS, LEFT LEG 09/14/2008  . CHEST PAIN 07/07/2009  . Complication of anesthesia 08-28-2014   "work up in recovery room with jaw locked open"  . CORNS AND CALLUSES 07/03/2008  . Depression   . FOOT PAIN 09/22/2009  . IRREGULAR MENSES 06/03/2008  . Neoplasm    left renal  . OCD (obsessive compulsive disorder)   . Stress fracture of ankle since Jun 29, 2014   left  . Supraventricular tachycardia ([place]) 06/02/2007   Atrial tachycardia with Wenckebach  periodicity   Past Surgical History:  Procedure Laterality Date  . LAPAROSCOPIC APPENDECTOMY N/A 08/28/2014   Procedure: APPENDECTOMY LAPAROSCOPIC;  Surgeon: Judeth Horn, MD;  Location: [facility];  Service: General;  Laterality: N/A;  . ROBOTIC ASSITED PARTIAL NEPHRECTOMY Left 01/06/2015   Procedure: ROBOTIC ASSISTED PARTIAL NEPHRECTOMY;  Surgeon: Raynelle Bring, MD;  Location: [facility];  Service: Urology;  Laterality: Left;  . WISDOM TOOTH EXTRACTION     Allergies  Allergen Reactions  . Latex     REACTION: rash  . Amoxicillin Rash    Has patient had a PCN reaction causing immediate rash, facial/tongue/throat swelling, SOB or  lightheadedness with hypotension: unkown Has patient had a PCN reaction causing severe rash involving mucus membranes or skin necrosis: no Has patient had a PCN reaction that required hospitalization No Has patient had a PCN reaction occurring within the last 10 years: unknown If all of the above answers are "NO", then may proceed with Cephalosporin use.   Prior to Admission medications   Medication Sig Start Date End Date Taking? Authorizing Provider  buPROPion (WELLBUTRIN XL) 300 MG 24 hr tablet Take 1 tablet (300 mg total) by mouth daily. 07/20/16  Yes Martin[place], Betty G, MD  Calcium-Magnesium-Vitamin D (CALCIUM MAGNESIUM PO) Take 4 tablets by mouth daily.    Yes [provider]  clonazePAM (KLONOPIN) 1 MG tablet Take 1 tablet (1 mg total) by mouth daily. 06/25/16  Yes Dorena Cookey, MD  HYDROcodone-acetaminophen (NORCO/VICODIN) 5-325 MG tablet Take 1 tablet by mouth every 6 (six) hours as needed for moderate pain. 11/15/16  Yes Wendie Agreste, MD  metoprolol succinate (TOPROL-XL) 25 MG 24 hr tablet Take 0.5 tablets (12.5 mg total) by mouth 2 (two) times daily. Patient taking differently: Take 12.5 mg by mouth at bedtime. Patient takes only at hs 12/03/14  Yes Dorena Cookey, MD  Probiotic Product (ALIGN) 4 MG CAPS Take 1 capsule by mouth daily.   Yes [provider]  Pyridoxine HCl (VITAMIN B-6 PO) Take 1 tablet by mouth daily.   Yes [provider]  TURMERIC PO Take 3 tablets by mouth daily.    Yes [provider]   Social History   Social History  . Marital status: Married    Spouse name: N/A  . Number of children: N/A  . Years of education: N/A   Occupational History  . 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 Smoking status: Never Smoker  . Smokeless tobacco: Never Used  . Alcohol use Yes     Comment: social  . Drug use: No  . Sexual activity: Yes     Comment: married   Other Topics Concern  . Not on file     Social History Narrative   No caffeine      Review of Systems  Respiratory: Positive for shortness of breath.   Musculoskeletal:       Soreness in left lower leg. Pain in left wrist. Chest soreness       Objective:   Physical Exam  Cardiovascular: Normal rate, regular rhythm and normal heart sounds.  Exam reveals no gallop and no friction rub.   No murmur heard. Pulmonary/Chest: Effort normal and breath sounds normal. She has no wheezes. She has no rales.  Musculoskeletal:  Left wrist has no scaphoid tenderness. Tenderness along proximal carpal row, ulner aspect. Distal radius and ulna are non tender. Full ROM in left 5th digit. Able to flex and extend  at all joints.  Chest shows clavical AC and CS joints, non tender.  Lower sternum is tender, no crepitance Slight tenderness on right upper chest wall on clavicular line.  Left lower leg shows the left knee no focal bony tenderness, normal ROM, left ankle no focal bony tenderness, pain free ROM in ankle. Healing abrasion along middle to lower 3rd anterior aspect. She is tender along mid tibia with associated soft tissue swelling in small area of ecchymosis. Fibula non-tender.      Vitals:   11/22/16 1111  BP: 102/66  Pulse: 69  Resp: 18  Temp: 99.1 F (37.3 C)  TempSrc: Oral  SpO2: 99%  Weight: 136 lb 12.8 oz (62.1 kg)  Height: 5\' 7"  (1.702 m)   Results for orders placed or performed during the hospital encounter of 11/15/16  Creatinine, serum  Result Value Ref Range   Creatinine, Ser 0.85 0.44 - 1.00 mg/dL   GFR calc non Af Amer >60 >60 mL/min   GFR calc Af Amer >60 >60 mL/min   Dg Chest 2 View  Result Date: 11/22/2016 CLINICAL DATA:  Fall 8 days ago.  Chest contusion.  Slight dyspnea. EXAM: CHEST  2 VIEW COMPARISON:  Chest CT 11/15/2016.  Sternal radiographs 11/15/2016. FINDINGS: The heart size and mediastinal contours are stable. There is stable mild biapical scarring. The lungs are otherwise clear. There is no  pleural effusion or pneumothorax. No fractures are evident. IMPRESSION: Stable chest.  No acute cardiopulmonary process. Electronically Signed   By: Richardean Sale M.D.   On: 11/22/2016 12:08   Dg Tibia/fibula Left  Result Date: 11/22/2016 CLINICAL DATA:  Fall on 11/24/2016. Anterior left lower leg pain. Initial encounter. EXAM: LEFT TIBIA AND FIBULA - 2 VIEW COMPARISON:  Left ankle radiographs 05/30/2014 FINDINGS: There is no evidence of fracture or other focal bone lesions. Soft tissues are unremarkable. IMPRESSION: Negative. Electronically Signed   By: Logan Bores M.D.   On: 11/22/2016 12:06        Assessment & Plan:   HEYLI MIN is a 61 y.o. female Contusion of chest wall, unspecified laterality, subsequent encounter - Plan: DG Chest 2 View Dyspnea, unspecified type - Plan: DG Chest 2 View  - contusion without concerning findings on CT and CXR without effusion today. Reassuring exam and vital signs.   -continue symptomatic care - ibuprofen, then hydrocodone as needed.   - rtc precautions.   Left wrist pain  - no longer having scaphoid or radial sided tenderness. Ok to use non-spica splint she brought from home. Follow up with hand specialist as planned next week.   Pain in left lower leg Contusion of left lower leg, subsequent encounter - Plan: DG Tibia/Fibula Left  - no fracture seen. No signs of infection at wound. Symptomatic care and rtc precautions given.  No orders of the defined types were placed in this encounter.  Patient Instructions   Xray of chest and leg looks ok. Keep follow up with orthopaedist for your hand and use the brace until that visit. Ibuprofen 600mg  every 6 hours as needed with food for chest wall pain.  That should improve with time.  Return to the clinic or go to the nearest emergency room if any of your symptoms worsen or new symptoms occur.   Chest Wall Pain Chest wall pain is pain in or around the bones and muscles of your chest. Sometimes,  an injury causes this pain. Sometimes, the cause may not be known. This pain may take  several weeks or longer to get better. Follow these instructions at home: Pay attention to any changes in your symptoms. Take these actions to help with your pain:  Rest as told by your health care provider.  Avoid activities that cause pain. These include any activities that use your chest muscles or your abdominal and side muscles to lift heavy items.  If directed, apply ice to the painful area: ? Put ice in a plastic bag. ? Place a towel between your skin and the bag. ? Leave the ice on for 20 minutes, 2-3 times per day.  Take over-the-counter and prescription medicines only as told by your health care provider.  Do not use tobacco products, including cigarettes, chewing tobacco, and e-cigarettes. If you need help quitting, ask your health care provider.  Keep all follow-up visits as told by your health care provider. This is important.  Contact a health care provider if:  You have a fever.  Your chest pain becomes worse.  You have new symptoms. Get help right away if:  You have nausea or vomiting.  You feel sweaty or light-headed.  You have a cough with phlegm (sputum) or you cough up blood.  You develop shortness of breath. This information is not intended to replace advice given to you by your health care provider. Make sure you discuss any questions you have with your health care provider. Document Released: 01/18/2005 Document Revised: 05/29/2015 Document Reviewed: 04/15/2014 Elsevier Interactive Patient Education  2017 [employer].    IF you received an x-ray today, you will receive an invoice from [facility]. Please contact [facility] at [phone] with questions or concerns regarding your invoice.   IF you received labwork today, you will receive an invoice from [facility]. Please contact [facility] at [phone] with questions or concerns regarding your  invoice.   Our billing staff will not be able to assist you with questions regarding bills from these companies.  You will be contacted with the lab results as soon as they are available. The fastest way to get your results is to activate your My Chart account. Instructions are located on the last page of this paperwork. If you have not heard from [place] regarding the results in 2 weeks, please contact this office.       I personally performed the services described in this documentation, which was scribed in my presence. The recorded information has been reviewed and considered for accuracy and completeness, addended by me as needed, and agree with information above.  Signed,   Merri Ray, MD [facility] at [place].  11/23/16 8:32 AM

## 2016-11-22 NOTE — Patient Instructions (Addendum)
Xray of chest and leg looks ok. Keep follow up with orthopaedist for your hand and use the brace until that visit. Ibuprofen 600mg  every 6 hours as needed with food for chest wall pain.  That should improve with time.  Return to the clinic or go to the nearest emergency room if any of your symptoms worsen or new symptoms occur.   Chest Wall Pain Chest wall pain is pain in or around the bones and muscles of your chest. Sometimes, an injury causes this pain. Sometimes, the cause may not be known. This pain may take several weeks or longer to get better. Follow these instructions at home: Pay attention to any changes in your symptoms. Take these actions to help with your pain:  Rest as told by your health care provider.  Avoid activities that cause pain. These include any activities that use your chest muscles or your abdominal and side muscles to lift heavy items.  If directed, apply ice to the painful area: ? Put ice in a plastic bag. ? Place a towel between your skin and the bag. ? Leave the ice on for 20 minutes, 2-3 times per day.  Take over-the-counter and prescription medicines only as told by your health care provider.  Do not use tobacco products, including cigarettes, chewing tobacco, and e-cigarettes. If you need help quitting, ask your health care provider.  Keep all follow-up visits as told by your health care provider. This is important.  Contact a health care provider if:  You have a fever.  Your chest pain becomes worse.  You have new symptoms. Get help right away if:  You have nausea or vomiting.  You feel sweaty or light-headed.  You have a cough with phlegm (sputum) or you cough up blood.  You develop shortness of breath. This information is not intended to replace advice given to you by your health care provider. Make sure you discuss any questions you have with your health care provider. Document Released: 01/18/2005 Document Revised: 05/29/2015 Document  Reviewed: 04/15/2014 Elsevier Interactive Patient Education  2017 [employer].    IF you received an x-ray today, you will receive an invoice from [facility]. Please contact [facility] at [phone] with questions or concerns regarding your invoice.   IF you received labwork today, you will receive an invoice from [place]. Please contact [facility] at [phone] with questions or concerns regarding your invoice.   Our billing staff will not be able to assist you with questions regarding bills from these companies.  You will be contacted with the lab results as soon as they are available. The fastest way to get your results is to activate your My Chart account. Instructions are located on the last page of this paperwork. If you have not heard from [place] regarding the results in 2 weeks, please contact this office.

## 2016-11-29 DIAGNOSIS — S62115A Nondisplaced fracture of triquetrum [cuneiform] bone, left wrist, initial encounter for closed fracture: Secondary | ICD-10-CM | POA: Diagnosis not present

## 2016-12-16 DIAGNOSIS — S62115D Nondisplaced fracture of triquetrum [cuneiform] bone, left wrist, subsequent encounter for fracture with routine healing: Secondary | ICD-10-CM | POA: Diagnosis not present

## 2017-01-07 DIAGNOSIS — M25532 Pain in left wrist: Secondary | ICD-10-CM | POA: Diagnosis not present

## 2017-01-07 DIAGNOSIS — S62115D Nondisplaced fracture of triquetrum [cuneiform] bone, left wrist, subsequent encounter for fracture with routine healing: Secondary | ICD-10-CM | POA: Diagnosis not present

## 2017-01-30 ENCOUNTER — Telehealth: Payer: Self-pay | Admitting: Family Medicine

## 2017-01-30 DIAGNOSIS — F411 Generalized anxiety disorder: Secondary | ICD-10-CM

## 2017-02-02 NOTE — Telephone Encounter (Signed)
Please schedule follow up appt for 03/2017. Thanks, BJ

## 2017-02-04 NOTE — Telephone Encounter (Signed)
lmom for pt to call back for a follow up appointment.

## 2017-02-07 ENCOUNTER — Other Ambulatory Visit: Payer: Self-pay | Admitting: Family Medicine

## 2017-02-08 ENCOUNTER — Telehealth: Payer: Self-pay

## 2017-02-08 NOTE — Telephone Encounter (Signed)
Pt Rx fwas phoned in to [facility] for Clonazepam was sent to [facility] for refills

## 2017-02-09 DIAGNOSIS — M25551 Pain in right hip: Secondary | ICD-10-CM | POA: Diagnosis not present

## 2017-02-09 DIAGNOSIS — M25532 Pain in left wrist: Secondary | ICD-10-CM | POA: Diagnosis not present

## 2017-02-09 DIAGNOSIS — S62115D Nondisplaced fracture of triquetrum [cuneiform] bone, left wrist, subsequent encounter for fracture with routine healing: Secondary | ICD-10-CM | POA: Diagnosis not present

## 2017-03-09 ENCOUNTER — Encounter: Payer: Self-pay | Admitting: Family Medicine

## 2017-03-09 ENCOUNTER — Ambulatory Visit (INDEPENDENT_AMBULATORY_CARE_PROVIDER_SITE_OTHER): Payer: BLUE CROSS/BLUE SHIELD | Admitting: Family Medicine

## 2017-03-09 ENCOUNTER — Telehealth: Payer: Self-pay | Admitting: Family Medicine

## 2017-03-09 VITALS — BP 116/60 | HR 80 | Temp 98.8°F | Resp 12 | Ht 67.0 in | Wt 138.5 lb

## 2017-03-09 DIAGNOSIS — J019 Acute sinusitis, unspecified: Secondary | ICD-10-CM | POA: Diagnosis not present

## 2017-03-09 DIAGNOSIS — R509 Fever, unspecified: Secondary | ICD-10-CM

## 2017-03-09 DIAGNOSIS — M25551 Pain in right hip: Secondary | ICD-10-CM | POA: Diagnosis not present

## 2017-03-09 DIAGNOSIS — J069 Acute upper respiratory infection, unspecified: Secondary | ICD-10-CM | POA: Diagnosis not present

## 2017-03-09 LAB — POC INFLUENZA A&B (BINAX/QUICKVUE)
Influenza A, POC: NEGATIVE
Influenza B, POC: NEGATIVE

## 2017-03-09 MED ORDER — CEFDINIR 300 MG PO CAPS: 300.0000 mg | ORAL_CAPSULE | Freq: Two times a day (BID) | ORAL | 0 refills | Status: AC

## 2017-03-09 NOTE — Patient Instructions (Addendum)
A few things to remember from today's visit:   Cough - Plan: POC Influenza A&B (Binax test)  Fever, unspecified fever cause - Plan: POC Influenza A&B (Binax test)  Acute non-recurrent sinusitis, unspecified location  URI, acute  It could be viral infections,which are self-limited and we treat each symptom depending of severity.     Over the counter medications as decongestants and cold medications usually help, they need to be taken with caution if there is a history of high blood pressure or palpitations. Tylenol and/or Ibuprofen also helps with most symptoms (headache, muscle aching, fever,etc) Plenty of fluids. Honey helps with cough. Steam inhalations helps with runny nose, nasal congestion, and may prevent sinus infections. Cough and nasal congestion could last a few days and sometimes weeks. Please follow in not any better in 1-2 weeks or if symptoms get worse.  Please be sure medication list is accurate. If a new problem present, please set up appointment sooner than planned today.

## 2017-03-09 NOTE — Telephone Encounter (Signed)
Copied from [place] [id number]. Topic: Inquiry >> Mar 09, 2017 10:19 AM Cecelia Byars, NT wrote: Reason for CRM: Patient would like for Dr Sherren Mocha to give her a call ,she was his patient before would not disclose details .

## 2017-03-09 NOTE — Progress Notes (Signed)
ACUTE VISIT  HPI:  Chief Complaint  Patient presents with  . Cough    GREEN MUCUS, STARTED 1 WEEK AGO  . Nasal Congestion  . Facial Pain    STARTED MONDAY    Ms.Edgar J Lose is a 62 y.o.female here today complaining of 7 days of respiratory symptoms.  She noted subjective fever when symptoms first started, she felt better for a few days and symptoms got worse 2-3 days ago. Frontal headache and facial pain, mainly left maxillary sinus.  Ears "pop" upon swallowing,no earache or hearing loss.  URI   This is a new problem. The current episode started in the past 7 days. The problem has been gradually worsening. Associated symptoms include congestion, coughing, headaches, rhinorrhea, sinus pain and a sore throat. Pertinent negatives include no abdominal pain, diarrhea, ear pain, nausea, neck pain, rash, vomiting or wheezing. She has tried decongestant for the symptoms. The treatment provided no relief.   Productive cough with greenish sputum. Moderate nasal congestion, rhinorrhea,and post nasal drainage. There symptoms are constant, she has not identified exacerbating or alleviating factors. Mild sore throat.   No Hx of recent travel. Her father was recently diagnosed with influenza and pneumonia. No known insect bite.  Hx of allergies: Yes  OTC medications for this problem: She tried Sudafed until 2 days ago, discontinued because it was not working. She also tried [employer] pot, did not help either.  Review of Systems  Constitutional: Positive for activity change, chills, fatigue and fever. Negative for appetite change.  HENT: Positive for congestion, postnasal drip, rhinorrhea, sinus pain and sore throat. Negative for ear pain, facial swelling, mouth sores, trouble swallowing and voice change.   Eyes: Negative for discharge and redness.  Respiratory: Positive for cough. Negative for shortness of breath and wheezing.   Cardiovascular: Negative for leg swelling.   Gastrointestinal: Negative for abdominal pain, diarrhea, nausea and vomiting.  Musculoskeletal: Positive for myalgias. Negative for gait problem and neck pain.  Skin: Negative for rash.  Allergic/Immunologic: Positive for environmental allergies.  Neurological: Positive for headaches. Negative for facial asymmetry and weakness.  Hematological: Negative for adenopathy. Does not bruise/bleed easily.  Psychiatric/Behavioral: Negative for confusion. The patient is nervous/anxious.       Current Outpatient Medications on File Prior to Visit  Medication Sig Dispense Refill  . buPROPion (WELLBUTRIN XL) 300 MG 24 hr tablet take 1 tablet by mouth once daily 30 tablet 1  . Calcium-Magnesium-Vitamin D (CALCIUM MAGNESIUM PO) Take 4 tablets by mouth daily.     . clonazePAM (KLONOPIN) 1 MG tablet take 1 tablet by mouth once daily 90 tablet 1  . conjugated estrogens (PREMARIN) vaginal cream Place vaginally.    . metoprolol succinate (TOPROL-XL) 25 MG 24 hr tablet Take 0.5 tablets (12.5 mg total) by mouth 2 (two) times daily. (Patient taking differently: Take 12.5 mg by mouth at bedtime. Patient takes only at hs) 100 tablet 3  . Probiotic Product (ALIGN) 4 MG CAPS Take 1 capsule by mouth daily.    . Pyridoxine HCl (VITAMIN B-6 PO) Take 1 tablet by mouth daily.    . TURMERIC PO Take 3 tablets by mouth daily.     Marland Kitchen HYDROcodone-acetaminophen (NORCO/VICODIN) 5-325 MG tablet Take 1 tablet by mouth every 6 (six) hours as needed for moderate pain. (Patient not taking: Reported on 03/09/2017) 15 tablet 0  . [DISCONTINUED] estrogen, conjugated,-medroxyprogesterone (PREMPRO) 0.625-2.5 MG per tablet Take 1 tablet by mouth daily. 28 tablet 11  No current facility-administered medications on file prior to visit.      Past Medical History:  Diagnosis Date  . ALLERGIC RHINITIS 07/03/2008  . ANXIETY 06/02/2007  . Arthritis   . BELL'S PALSY, LEFT 12/02/2008  . Cancer ([place])   . CELLULITIS, LEFT LEG 09/14/2008  . CHEST  PAIN 07/07/2009  . Complication of anesthesia 08-28-2014   "work up in recovery room with jaw locked open"  . CORNS AND CALLUSES 07/03/2008  . Depression   . FOOT PAIN 09/22/2009  . IRREGULAR MENSES 06/03/2008  . Neoplasm    left renal  . OCD (obsessive compulsive disorder)   . Stress fracture of ankle since Jun 29, 2014   left  . Supraventricular tachycardia ([place]) 06/02/2007   Atrial tachycardia with Wenckebach periodicity   Allergies  Allergen Reactions  . Latex     REACTION: rash  . Amoxicillin Rash    Has patient had a PCN reaction causing immediate rash, facial/tongue/throat swelling, SOB or lightheadedness with hypotension: unkown Has patient had a PCN reaction causing severe rash involving mucus membranes or skin necrosis: no Has patient had a PCN reaction that required hospitalization No Has patient had a PCN reaction occurring within the last 10 years: unknown If all of the above answers are "NO", then may proceed with Cephalosporin use.    Social History   Socioeconomic History  . Marital status: Married    Spouse name: None  . Number of children: None  . Years of education: None  . Highest education level: None  Social Needs  . Financial resource strain: None  . Food insecurity - worry: None  . Food insecurity - inability: None  . Transportation needs - medical: None  . Transportation needs - non-medical: None  Occupational History  . Occupation: Building services engineer: [employer]  Tobacco Use  . Smoking status: Never Smoker  . Smokeless tobacco: Never Used  Substance and Sexual Activity  . Alcohol use: Yes    Comment: social  . Drug use: No  . Sexual activity: Yes    Comment: married  Other Topics Concern  . None  Social History Narrative   No caffeine     Vitals:   03/09/17 1444  BP: 116/60  Pulse: 80  Resp: 12  Temp: 98.8 F (37.1 C)  SpO2: 96%   Body mass index is 21.69 kg/m.  Physical Exam  Nursing note and vitals  reviewed. Constitutional: She is oriented to person, place, and time. She appears well-developed. She does not appear ill. No distress.  HENT:  Head: Normocephalic and atraumatic.  Right Ear: External ear and ear canal normal.  Left Ear: External ear and ear canal normal. A middle ear effusion is present.  Nose: Rhinorrhea present. Right sinus exhibits no maxillary sinus tenderness and no frontal sinus tenderness. Left sinus exhibits no maxillary sinus tenderness and no frontal sinus tenderness.  Mouth/Throat: Oropharynx is clear and moist and mucous membranes are normal.  Hypertrophic turbinates. Postnasal drainage.  Eyes: Conjunctivae are normal. Pupils are equal, round, and reactive to light.  Neck: No muscular tenderness present. No edema and no erythema present.  Cardiovascular: Normal rate and regular rhythm.  No murmur heard. Respiratory: Effort normal and breath sounds normal. No stridor. No respiratory distress.  Lymphadenopathy:    She has no cervical adenopathy.  Neurological: She is alert and oriented to person, place, and time. She has normal strength.  Skin: Skin is warm. No rash noted. No  erythema.  Psychiatric: Her mood appears anxious.  Well groomed, good eye contact.    ASSESSMENT AND PLAN:   Ms. Ketrina was seen today for cough, nasal congestion and facial pain.  Diagnoses and all orders for this visit:  Fever, unspecified fever cause  He had in the office rapid flu test negative. Recommend checking temperature.  Symptomatic treatment with Tylenol and/or NSAIDs as needed recommended.  -     POC Influenza A&B (Binax test)  URI, acute  Explained that most likely viral. Explained that cough and congestion can last a few days and even weeks sometimes. Plenty of p.o. fluids. Follow-up as needed.  -     POC Influenza A&B (Binax test)  Acute non-recurrent sinusitis, unspecified location  I recommend holding on antibiotics for 2-3 days, and start if she  feels worse or if symptoms are not intubated. Explained that this could be viral etiology, in which case antibiotic will not help. Steam inhalation and nasal irrigations with saline may also help. Instructed about warning signs. Follow-up as needed.  -     cefdinir (OMNICEF) 300 MG capsule; Take 1 capsule (300 mg total) by mouth 2 (two) times daily for 7 days.     -Ms. Mikell Camp Diep was advised to seek attention immediately if symptoms worsen or to follow if they persist or new concerns arise.       Layci Stenglein G. Martinique, MD  [facility]. [place] office.

## 2017-03-09 NOTE — Telephone Encounter (Signed)
Please Advise

## 2017-03-10 DIAGNOSIS — N390 Urinary tract infection, site not specified: Secondary | ICD-10-CM | POA: Diagnosis not present

## 2017-03-10 DIAGNOSIS — R8271 Bacteriuria: Secondary | ICD-10-CM | POA: Diagnosis not present

## 2017-03-10 DIAGNOSIS — R350 Frequency of micturition: Secondary | ICD-10-CM | POA: Diagnosis not present

## 2017-03-14 ENCOUNTER — Other Ambulatory Visit: Payer: Self-pay | Admitting: Family Medicine

## 2017-03-15 NOTE — Telephone Encounter (Signed)
Dr Jordans pt 

## 2017-03-18 ENCOUNTER — Ambulatory Visit (HOSPITAL_COMMUNITY)
Admission: RE | Admit: 2017-03-18 | Discharge: 2017-03-18 | Disposition: A | Discharge status: Home / Self Care | Payer: BLUE CROSS/BLUE SHIELD | Source: Ambulatory Visit | Attending: Urology | Admitting: Urology

## 2017-03-18 ENCOUNTER — Other Ambulatory Visit (HOSPITAL_COMMUNITY): Payer: Self-pay | Admitting: Urology

## 2017-03-18 DIAGNOSIS — Z85528 Personal history of other malignant neoplasm of kidney: Secondary | ICD-10-CM

## 2017-03-18 DIAGNOSIS — C649 Malignant neoplasm of unspecified kidney, except renal pelvis: Secondary | ICD-10-CM | POA: Diagnosis not present

## 2017-03-21 DIAGNOSIS — Z85528 Personal history of other malignant neoplasm of kidney: Secondary | ICD-10-CM | POA: Diagnosis not present

## 2017-03-21 DIAGNOSIS — K7689 Other specified diseases of liver: Secondary | ICD-10-CM | POA: Diagnosis not present

## 2017-03-22 DIAGNOSIS — M25551 Pain in right hip: Secondary | ICD-10-CM | POA: Diagnosis not present

## 2017-03-25 DIAGNOSIS — N3020 Other chronic cystitis without hematuria: Secondary | ICD-10-CM | POA: Diagnosis not present

## 2017-03-25 DIAGNOSIS — Z85528 Personal history of other malignant neoplasm of kidney: Secondary | ICD-10-CM | POA: Diagnosis not present

## 2017-03-28 DIAGNOSIS — S62115D Nondisplaced fracture of triquetrum [cuneiform] bone, left wrist, subsequent encounter for fracture with routine healing: Secondary | ICD-10-CM | POA: Diagnosis not present

## 2017-03-29 ENCOUNTER — Ambulatory Visit (INDEPENDENT_AMBULATORY_CARE_PROVIDER_SITE_OTHER): Payer: BLUE CROSS/BLUE SHIELD | Admitting: Family Medicine

## 2017-03-29 ENCOUNTER — Encounter: Payer: Self-pay | Admitting: Family Medicine

## 2017-03-29 ENCOUNTER — Other Ambulatory Visit (HOSPITAL_COMMUNITY)
Admission: RE | Admit: 2017-03-29 | Discharge: 2017-03-29 | Disposition: A | Discharge status: Home / Self Care | Payer: BLUE CROSS/BLUE SHIELD | Source: Ambulatory Visit | Attending: Family Medicine | Admitting: Family Medicine

## 2017-03-29 VITALS — BP 100/74 | HR 64 | Temp 98.3°F | Ht 66.0 in | Wt 136.0 lb

## 2017-03-29 DIAGNOSIS — Z0000 Encounter for general adult medical examination without abnormal findings: Secondary | ICD-10-CM | POA: Diagnosis not present

## 2017-03-29 DIAGNOSIS — Z0001 Encounter for general adult medical examination with abnormal findings: Secondary | ICD-10-CM | POA: Insufficient documentation

## 2017-03-29 DIAGNOSIS — I471 Supraventricular tachycardia: Secondary | ICD-10-CM | POA: Diagnosis not present

## 2017-03-29 DIAGNOSIS — F411 Generalized anxiety disorder: Secondary | ICD-10-CM | POA: Diagnosis not present

## 2017-03-29 DIAGNOSIS — N951 Menopausal and female climacteric states: Secondary | ICD-10-CM | POA: Diagnosis not present

## 2017-03-29 DIAGNOSIS — Z124 Encounter for screening for malignant neoplasm of cervix: Secondary | ICD-10-CM | POA: Diagnosis not present

## 2017-03-29 LAB — LIPID PANEL
CHOLESTEROL: 252 mg/dL — AB (ref 0–200)
HDL: 77.8 mg/dL (ref >39.00)
LDL Cholesterol: 160 mg/dL — ABNORMAL HIGH (ref 0–99)
NonHDL: 174.36
TRIGLYCERIDES: 73 mg/dL (ref 0.0–149.0)
Total CHOL/HDL Ratio: 3
VLDL: 14.6 mg/dL (ref 0.0–40.0)

## 2017-03-29 LAB — CBC WITH DIFFERENTIAL/PLATELET
BASOS ABS: 0 10*3/uL (ref 0.0–0.1)
Basophils Relative: 0.8 % (ref 0.0–3.0)
EOS ABS: 0.3 10*3/uL (ref 0.0–0.7)
Eosinophils Relative: 4.8 % (ref 0.0–5.0)
HEMATOCRIT: 39.5 % (ref 36.0–46.0)
Hemoglobin: 13.4 g/dL (ref 12.0–15.0)
LYMPHS PCT: 37.6 % (ref 12.0–46.0)
Lymphs Abs: 2.4 10*3/uL (ref 0.7–4.0)
MCHC: 33.9 g/dL (ref 30.0–36.0)
MCV: 91.6 fl (ref 78.0–100.0)
Monocytes Absolute: 0.6 10*3/uL (ref 0.1–1.0)
Monocytes Relative: 8.8 % (ref 3.0–12.0)
NEUTROS ABS: 3 10*3/uL (ref 1.4–7.7)
NEUTROS PCT: 48 % (ref 43.0–77.0)
Platelets: 268 10*3/uL (ref 150.0–400.0)
RBC: 4.31 Mil/uL (ref 3.87–5.11)
RDW: 13.4 % (ref 11.5–15.5)
WBC: 6.3 10*3/uL (ref 4.0–10.5)

## 2017-03-29 LAB — BASIC METABOLIC PANEL
BUN: 17 mg/dL (ref 6–23)
CALCIUM: 9.8 mg/dL (ref 8.4–10.5)
CO2: 32 mEq/L (ref 19–32)
Chloride: 103 mEq/L (ref 96–112)
Creatinine, Ser: 0.76 mg/dL (ref 0.40–1.20)
GFR: 82.3 mL/min (ref >60.00)
Glucose, Bld: 85 mg/dL (ref 70–99)
Potassium: 4 mEq/L (ref 3.5–5.1)
Sodium: 142 mEq/L (ref 135–145)

## 2017-03-29 LAB — HEPATIC FUNCTION PANEL
ALT: 12 U/L (ref 0–35)
AST: 12 U/L (ref 0–37)
Albumin: 4.3 g/dL (ref 3.5–5.2)
Alkaline Phosphatase: 59 U/L (ref 39–117)
BILIRUBIN DIRECT: 0.1 mg/dL (ref 0.0–0.3)
TOTAL PROTEIN: 6.7 g/dL (ref 6.0–8.3)
Total Bilirubin: 0.6 mg/dL (ref 0.2–1.2)

## 2017-03-29 LAB — POCT URINALYSIS DIPSTICK
Bilirubin, UA: NEGATIVE
Glucose, UA: NEGATIVE
Ketones, UA: NEGATIVE
Leukocytes, UA: NEGATIVE
NITRITE UA: NEGATIVE
PH UA: 6 (ref 5.0–8.0)
PROTEIN UA: NEGATIVE
UROBILINOGEN UA: 0.2 U/dL

## 2017-03-29 LAB — TSH: TSH: 1.57 u[IU]/mL (ref 0.35–4.50)

## 2017-03-29 LAB — VITAMIN D 25 HYDROXY (VIT D DEFICIENCY, FRACTURES): VITD: 42.32 ng/mL (ref 30.00–100.00)

## 2017-03-29 MED ORDER — METOPROLOL SUCCINATE ER 25 MG PO TB24: 12.5000 mg | ORAL_TABLET | Freq: Two times a day (BID) | ORAL | 3 refills | Status: DC

## 2017-03-29 MED ORDER — BUPROPION HCL ER (XL) 300 MG PO TB24: 300.0000 mg | ORAL_TABLET | Freq: Every day | ORAL | 4 refills | Status: DC

## 2017-03-29 MED ORDER — ESTROGENS, CONJUGATED 0.625 MG/GM VA CREA: TOPICAL_CREAM | Freq: Every day | VAGINAL | 11 refills | Status: DC

## 2017-03-29 NOTE — Patient Instructions (Signed)
Labs today......... I will call you if his anything abnormal  Call your insurance company to find out where you can get the new shingles vaccine  The main number for GI to inquire about your colonoscopy history 36 [phone]  Butts [email or web address]............ for your Premarin cream..... I would use small amounts 3 times weekly for 2-3 months. Then decrease to small amounts twice weekly. However if you need more continue to use it 3 times weekly. Also we discussed using the astro- glide gel  You continue also use small amounts of the OTC steroid cream for the periorbital dermatitis  Return in one year for general physical exam sooner if any problems

## 2017-03-29 NOTE — Progress Notes (Signed)
Edgar is a delightful 62 year old married female G0 P0.. Who comes in today for annual physical examination  She's always been Health she's had no chronic health problems except for mild anxiety sleep dysfunction postmenopausal dryness.  She takes Wellbutrin 300 mg daily in the morning because a history of mild depression and anxiety  She takes Klonopin 1 mg........ one half to one quarter tablet....... at bedtime for sleep  She was using the Premarin vaginal cream however showing used to twice a week for short periods at times and says it didn't work. She can have intercourse because her vagina so dry. We talked about various options  She takes Toprol 25 mg......... one half tablet twice a day when necessary for palpitations. She has a history of SVT  She gets routine eye care, dental care, and you mammography, colonoscopy 2009 was normal. She is due for follow-up colonoscopy this year.  Vaccinations......... all up-to-date  Information given on shingles vaccine  Discussed hepatitis C screening she declined low risk category.  Last Pap smear was 3 years ago by Dr. Deatra Ina it was normal.  She's got perioral dermatitis uses a steroid cream  For the past 6 months she's had pain in her right hip flexor area. She's had an x-ray of her hip by Dr. Ihor Gully which was normal. She had an MRI the other day. She's going to see him for follow-up tomorrow.  She saw Dr. Alinda Money 6 months ago for follow-up. She had a appendectomy couple years ago and it's a time the CT scan showed a lesion in her kidney. She underwent a removal of the lesion turned out to be a kidney cancer. No evidence of metastasis. She's followed by Dr. Alinda Money. She's now on a 1 year follow-up.  She exercises on a regular basis  14 point review of systems reviewed and otherwise negative  BP 100/74 (BP Location: Left Arm, Patient Position: Sitting, Cuff Size: Normal)   Pulse 64   Temp 98.3 F (36.8 C) (Oral)   Ht 5\' 6"  (1.676 m)    Wt 136 lb (61.7 kg)   LMP 08/01/2012   BMI 21.95 kg/m  Well-developed well-nourished female no acute distress vital signs stable she's afebrile HEENT were negative neck was supple no adenopathy thyroid normal no bruits cardiopulmonary exam normal breast exam normal abdominal exam normal pelvic examination external genitalia within normal limits except for extreme dryness. Vaginal vault was normal cervix is visualized Pap smear was done bimanual exam negative. Rectal exam deferred to she's got have a colonoscopy this year. Extremities normal skin normal peripheral pulses normal except for numerous seborrheic keratosis.  #1 healthy female  #2 postmenopausal vaginal dryness............ outlined a program of Premarin vaginal cream  #3, mild depression........ continue Wellbutrin 300 mg every morning  Number for sleep dysfunction......... continue Klonopin 1 mg........ one 12:45 half tablet daily at bedtime  #5 status post left partial nephrectomy...Marland KitchenMarland KitchenMarland Kitchen for renal cell carcinoma. Followed by Dr. Alinda Money.  History of SVT........ continue Toprol 25 mg twice a day when necessary

## 2017-03-30 DIAGNOSIS — M25551 Pain in right hip: Secondary | ICD-10-CM | POA: Diagnosis not present

## 2017-03-30 DIAGNOSIS — M1611 Unilateral primary osteoarthritis, right hip: Secondary | ICD-10-CM | POA: Diagnosis not present

## 2017-03-30 LAB — CYTOLOGY - PAP: DIAGNOSIS: NEGATIVE

## 2017-05-11 ENCOUNTER — Encounter: Payer: Self-pay | Admitting: Physician Assistant

## 2017-05-26 ENCOUNTER — Encounter: Payer: Self-pay | Admitting: Gastroenterology

## 2017-05-27 DIAGNOSIS — Z1231 Encounter for screening mammogram for malignant neoplasm of breast: Secondary | ICD-10-CM | POA: Diagnosis not present

## 2017-05-27 LAB — HM MAMMOGRAPHY

## 2017-05-31 ENCOUNTER — Encounter: Payer: Self-pay | Admitting: Family Medicine

## 2017-06-21 DIAGNOSIS — L918 Other hypertrophic disorders of the skin: Secondary | ICD-10-CM | POA: Diagnosis not present

## 2017-06-21 DIAGNOSIS — K130 Diseases of lips: Secondary | ICD-10-CM | POA: Diagnosis not present

## 2017-06-22 ENCOUNTER — Encounter: Payer: Self-pay | Admitting: Gastroenterology

## 2017-07-25 DIAGNOSIS — K130 Diseases of lips: Secondary | ICD-10-CM | POA: Diagnosis not present

## 2017-08-02 DIAGNOSIS — M7061 Trochanteric bursitis, right hip: Secondary | ICD-10-CM | POA: Diagnosis not present

## 2017-08-02 DIAGNOSIS — M25551 Pain in right hip: Secondary | ICD-10-CM | POA: Diagnosis not present

## 2017-08-02 DIAGNOSIS — M1611 Unilateral primary osteoarthritis, right hip: Secondary | ICD-10-CM | POA: Diagnosis not present

## 2017-08-08 DIAGNOSIS — H02834 Dermatochalasis of left upper eyelid: Secondary | ICD-10-CM | POA: Diagnosis not present

## 2017-09-02 ENCOUNTER — Telehealth: Payer: Self-pay | Admitting: Family Medicine

## 2017-09-02 DIAGNOSIS — M6281 Muscle weakness (generalized): Secondary | ICD-10-CM | POA: Diagnosis not present

## 2017-09-02 DIAGNOSIS — M1611 Unilateral primary osteoarthritis, right hip: Secondary | ICD-10-CM | POA: Diagnosis not present

## 2017-09-02 DIAGNOSIS — M25551 Pain in right hip: Secondary | ICD-10-CM | POA: Diagnosis not present

## 2017-09-02 NOTE — Telephone Encounter (Signed)
Rx refill request: Klonopin 1 mg     Last filled: 02/08/17  LOV: 03/29/17  PCP: Martinique  [facility]arm[facility]: [facility]

## 2017-09-02 NOTE — Telephone Encounter (Signed)
Copied from [place] #[id number]. Topic: Quick Communication - Rx Refill/Question >> Sep 02, 2017  1:33 PM Keene Breath wrote: Medication: clonazePAM (KLONOPIN) 1 MG tablet  Patient called to request refill for the above medication.  CB# [phone]  Preferred Pharmacy (with phone number or street name): [facility] [phone] - [place], [place] - [address] AT [address] [phone] (Phone) [phone] (Fax)

## 2017-09-06 DIAGNOSIS — M1611 Unilateral primary osteoarthritis, right hip: Secondary | ICD-10-CM | POA: Diagnosis not present

## 2017-09-06 DIAGNOSIS — M6281 Muscle weakness (generalized): Secondary | ICD-10-CM | POA: Diagnosis not present

## 2017-09-06 DIAGNOSIS — M25551 Pain in right hip: Secondary | ICD-10-CM | POA: Diagnosis not present

## 2017-09-07 NOTE — Telephone Encounter (Signed)
Pt calling to f/u on RX for clonazepam. I see RX refilled was denied but do not understand why. I asked pt about PCP being Dr. Martinique and she states PCP is Dr. Sherren Mocha. She states that he fills this medication for 90 day supply. She had a refill left but it was past 6 months so the pharmacy advised it could not be filled and they needed new RX. Pt states the pharmacy has been contacting the office since 7/31. Please call pt to advise.

## 2017-09-09 DIAGNOSIS — M6281 Muscle weakness (generalized): Secondary | ICD-10-CM | POA: Diagnosis not present

## 2017-09-09 DIAGNOSIS — M25551 Pain in right hip: Secondary | ICD-10-CM | POA: Diagnosis not present

## 2017-09-09 DIAGNOSIS — M1611 Unilateral primary osteoarthritis, right hip: Secondary | ICD-10-CM | POA: Diagnosis not present

## 2017-09-09 NOTE — Telephone Encounter (Signed)
Pt sees Dr Sherren Mocha requesting for Klonopin which was last refilled on 02/08/2017 given 90 tablets with 1 refill, pt LOV was 03/05/2017, please Advise if ok to refill.

## 2017-09-12 NOTE — Telephone Encounter (Signed)
Rx was called to pt pharmacy, left a message for pt to call the office and schedule a f/u app with Dr Sherren Mocha

## 2017-09-12 NOTE — Telephone Encounter (Signed)
Okay #60.  No refills.  Follow-up with Dr. Sherren Mocha in the fall

## 2017-09-13 DIAGNOSIS — H02413 Mechanical ptosis of bilateral eyelids: Secondary | ICD-10-CM | POA: Diagnosis not present

## 2017-09-13 DIAGNOSIS — M1611 Unilateral primary osteoarthritis, right hip: Secondary | ICD-10-CM | POA: Diagnosis not present

## 2017-09-13 DIAGNOSIS — M6281 Muscle weakness (generalized): Secondary | ICD-10-CM | POA: Diagnosis not present

## 2017-09-13 DIAGNOSIS — M25551 Pain in right hip: Secondary | ICD-10-CM | POA: Diagnosis not present

## 2017-09-14 DIAGNOSIS — Z01818 Encounter for other preprocedural examination: Secondary | ICD-10-CM | POA: Diagnosis not present

## 2017-09-16 DIAGNOSIS — M1611 Unilateral primary osteoarthritis, right hip: Secondary | ICD-10-CM | POA: Diagnosis not present

## 2017-09-16 DIAGNOSIS — M6281 Muscle weakness (generalized): Secondary | ICD-10-CM | POA: Diagnosis not present

## 2017-09-16 DIAGNOSIS — M25551 Pain in right hip: Secondary | ICD-10-CM | POA: Diagnosis not present

## 2017-09-24 ENCOUNTER — Encounter (HOSPITAL_COMMUNITY): Payer: Self-pay | Admitting: *Deleted

## 2017-09-24 ENCOUNTER — Ambulatory Visit (HOSPITAL_COMMUNITY)
Admission: EM | Admit: 2017-09-24 | Discharge: 2017-09-24 | Disposition: A | Discharge status: Home / Self Care | Payer: BLUE CROSS/BLUE SHIELD | Attending: Internal Medicine | Admitting: Internal Medicine

## 2017-09-24 DIAGNOSIS — Z79899 Other long term (current) drug therapy: Secondary | ICD-10-CM | POA: Diagnosis not present

## 2017-09-24 DIAGNOSIS — F429 Obsessive-compulsive disorder, unspecified: Secondary | ICD-10-CM | POA: Insufficient documentation

## 2017-09-24 DIAGNOSIS — M1990 Unspecified osteoarthritis, unspecified site: Secondary | ICD-10-CM | POA: Diagnosis not present

## 2017-09-24 DIAGNOSIS — F411 Generalized anxiety disorder: Secondary | ICD-10-CM | POA: Diagnosis not present

## 2017-09-24 DIAGNOSIS — I471 Supraventricular tachycardia: Secondary | ICD-10-CM | POA: Insufficient documentation

## 2017-09-24 DIAGNOSIS — R300 Dysuria: Secondary | ICD-10-CM | POA: Insufficient documentation

## 2017-09-24 DIAGNOSIS — Z85528 Personal history of other malignant neoplasm of kidney: Secondary | ICD-10-CM | POA: Insufficient documentation

## 2017-09-24 DIAGNOSIS — F329 Major depressive disorder, single episode, unspecified: Secondary | ICD-10-CM | POA: Diagnosis not present

## 2017-09-24 DIAGNOSIS — Z905 Acquired absence of kidney: Secondary | ICD-10-CM | POA: Diagnosis not present

## 2017-09-24 DIAGNOSIS — N390 Urinary tract infection, site not specified: Secondary | ICD-10-CM

## 2017-09-24 HISTORY — DX: Personal history of other malignant neoplasm of kidney: Z85.528

## 2017-09-24 HISTORY — DX: Disorder of kidney and ureter, unspecified: N28.9

## 2017-09-24 LAB — POCT URINALYSIS DIP (DEVICE)
Bilirubin Urine: NEGATIVE
GLUCOSE, UA: NEGATIVE mg/dL
KETONES UR: NEGATIVE mg/dL
Nitrite: NEGATIVE
PH: 6 (ref 5.0–8.0)
PROTEIN: NEGATIVE mg/dL
SPECIFIC GRAVITY, URINE: 1.02 (ref 1.005–1.030)
Urobilinogen, UA: 0.2 mg/dL (ref 0.0–1.0)

## 2017-09-24 MED ORDER — PHENAZOPYRIDINE HCL 100 MG PO TABS: 100.0000 mg | ORAL_TABLET | Freq: Three times a day (TID) | ORAL | 0 refills | Status: DC | PRN

## 2017-09-24 MED ORDER — NITROFURANTOIN MONOHYD MACRO 100 MG PO CAPS: 100.0000 mg | ORAL_CAPSULE | Freq: Two times a day (BID) | ORAL | 0 refills | Status: DC

## 2017-09-24 NOTE — ED Triage Notes (Signed)
C/O dysuria x approx 1.5 wks and urgency without fevers.

## 2017-09-24 NOTE — ED Notes (Signed)
Bed: UC01 Expected date:  Expected time:  Means of arrival:  Comments: 

## 2017-09-24 NOTE — ED Provider Notes (Addendum)
[facility]    CSN: [id number] Arrival date & time: 09/24/17  1404     History   Chief Complaint Chief Complaint  Patient presents with  . Appointment    1352  . Dysuria    HPI Shelby Grant is a 62 y.o. female.   62 y.o. female presents with dysuria, increase of frequency X 1 day. Patient has a history of drug resistant UTI and is being seen by urology. patient states last treatment was approximately 1 year ago. Patient denies any fevers, flank pain  Condition is acute in nature. Condition is made better by nothing. Condition is made worse by nothing. Patient denies any treatment prior to there arrival at this facility. Patient denies any flank pain, fever or loss of bladder or bowel.       Past Medical History:  Diagnosis Date  . ALLERGIC RHINITIS 07/03/2008  . ANXIETY 06/02/2007  . Arthritis   . BELL'S PALSY, LEFT 12/02/2008  . Cancer ([place])   . CELLULITIS, LEFT LEG 09/14/2008  . CHEST PAIN 07/07/2009  . Complication of anesthesia 08-28-2014   "work up in recovery room with jaw locked open"  . CORNS AND CALLUSES 07/03/2008  . Depression   . FOOT PAIN 09/22/2009  . History of kidney cancer   . IRREGULAR MENSES 06/03/2008  . Neoplasm    left renal  . OCD (obsessive compulsive disorder)   . Renal disorder   . Stress fracture of ankle since Jun 29, 2014   left  . Supraventricular tachycardia ([place]) 06/02/2007   Atrial tachycardia with Wenckebach periodicity    Patient Active Problem List   Diagnosis Date Noted  . Encounter for screening for cervical cancer  03/29/2017  . Routine general medical examination at a health care facility 03/29/2017  . Colon cancer screening 12/04/2015  . Renal neoplasm 01/06/2015  . Renal lesion 10/15/2014  . Menopause syndrome 02/11/2011  . ALLERGIC RHINITIS 07/03/2008  . Supraventricular tachycardia ([place]) 06/02/2007  . Generalized anxiety disorder 06/02/2007    Past Surgical History:  Procedure Laterality Date  . APPENDECTOMY     . LAPAROSCOPIC APPENDECTOMY N/A 08/28/2014   Procedure: APPENDECTOMY LAPAROSCOPIC;  Surgeon: Judeth Horn, MD;  Location: [facility];  Service: General;  Laterality: N/A;  . ROBOTIC ASSITED PARTIAL NEPHRECTOMY Left 01/06/2015   Procedure: ROBOTIC ASSISTED PARTIAL NEPHRECTOMY;  Surgeon: Raynelle Bring, MD;  Location: [facility];  Service: Urology;  Laterality: Left;  . WISDOM TOOTH EXTRACTION      OB History   None      Home Medications    Prior to Admission medications   Medication Sig Start Date End Date Taking? Authorizing Provider  ALFALFA PO Take by mouth.   Yes [provider]  b complex vitamins tablet Take 1 tablet by mouth daily.   Yes [provider]  buPROPion (WELLBUTRIN XL) 300 MG 24 hr tablet Take 1 tablet (300 mg total) by mouth daily. 03/29/17  Yes Dorena Cookey, MD  Calcium-Magnesium-Vitamin D (CALCIUM MAGNESIUM PO) Take 4 tablets by mouth daily.    Yes [provider]  clonazePAM Bobbye [place]) 1 MG tablet take 1 tablet by mouth once daily 02/08/17  Yes Dorena Cookey, MD  metoprolol succinate (TOPROL-XL) 25 MG 24 hr tablet Take 0.5 tablets (12.5 mg total) by mouth 2 (two) times daily. 03/29/17  Yes Dorena Cookey, MD  Multiple Vitamin (MULTIVITAMIN) capsule Take 1 capsule by mouth daily.   Yes [provider]  Probiotic Product (ALIGN) 4  MG CAPS Take 1 capsule by mouth daily.    Yes [provider]  Pyridoxine HCl (VITAMIN B-6 PO) Take 1 tablet by mouth daily.   Yes [provider]  TURMERIC PO Take 3 tablets by mouth daily.    Yes [provider]  estrogen, conjugated,-medroxyprogesterone (PREMPRO) 0.625-2.5 MG per tablet Take 1 tablet by mouth daily. 02/11/11 03/09/17  Dorena Cookey, MD    Family History Family History  Problem Relation Age of Onset  . Asthma Sister   . Cancer Mother        Marena Chancy where cancer started     Social History Social History   Tobacco Use  . Smoking status: Never Smoker  .  Smokeless tobacco: Never Used  Substance Use Topics  . Alcohol use: Yes    Comment: social  . Drug use: No     Allergies   Latex and Amoxicillin   Review of Systems Review of Systems  Constitutional: Negative for chills and fever.  HENT: Negative for ear pain and sore throat.   Eyes: Negative for pain and visual disturbance.  Respiratory: Negative for cough and shortness of breath.   Cardiovascular: Negative for chest pain and palpitations.  Gastrointestinal: Negative for abdominal pain and vomiting.  Genitourinary: Positive for difficulty urinating, dysuria and urgency. Negative for flank pain, hematuria, vaginal discharge and vaginal pain.  Musculoskeletal: Negative for arthralgias and back pain.  Skin: Negative for color change and rash.  Neurological: Negative for seizures and syncope.  All other systems reviewed and are negative.    Physical Exam Triage Vital Signs ED Triage Vitals [09/24/17 1420]  Enc Vitals Group     BP 119/75     Pulse Rate 70     Resp 16     Temp 98.5 F (36.9 C)     Temp Source Oral     SpO2 98 %     Weight      Height      Head Circumference      Peak Flow      Pain Score 0     Pain Loc      Pain Edu?      Excl. in [place]?    No data found.  Updated Vital Signs BP 119/75   Pulse 70   Temp 98.5 F (36.9 C) (Oral)   Resp 16   LMP 08/01/2012   SpO2 98%   Visual Acuity Right Eye Distance:   Left Eye Distance:   Bilateral Distance:    Right Eye Near:   Left Eye Near:    Bilateral Near:     Physical Exam  Constitutional: She is oriented to person, place, and time. She appears well-developed and well-nourished.  HENT:  Head: Normocephalic and atraumatic.  Eyes: Conjunctivae are normal.  Neck: Normal range of motion.  Pulmonary/Chest: Effort normal.  Neurological: She is alert and oriented to person, place, and time.  Psychiatric: She has a normal mood and affect.  Nursing note and vitals reviewed.    UC Treatments /  Results  Labs (all labs ordered are listed, but only abnormal results are displayed) Labs Reviewed - No data to display  EKG None  Radiology No results found.  Procedures Procedures (including critical care time)  Medications Ordered in UC Medications - No data to display  Initial Impression / Assessment and Plan / UC Course  I have reviewed the triage vital signs and the nursing notes.  Pertinent labs & imaging results that  were available during my care of the patient were reviewed by me and considered in my medical decision making (see chart for details).      Final Clinical Impressions(s) / UC Diagnoses   Final diagnoses:  None   Discharge Instructions   None    ED Prescriptions    None     Controlled Substance Prescriptions [place] Controlled Substance Registry consulted? Not Applicable   Jacqualine Mau, NP 09/24/17 1451    Jacqualine Mau, NP 09/24/17 1453

## 2017-09-25 LAB — URINE CULTURE: Culture: <10000 — AB

## 2017-09-27 DIAGNOSIS — S46911A Strain of unspecified muscle, fascia and tendon at shoulder and upper arm level, right arm, initial encounter: Secondary | ICD-10-CM | POA: Diagnosis not present

## 2017-09-27 DIAGNOSIS — M25511 Pain in right shoulder: Secondary | ICD-10-CM | POA: Diagnosis not present

## 2017-10-04 ENCOUNTER — Encounter: Payer: Self-pay | Admitting: Family Medicine

## 2017-10-04 ENCOUNTER — Ambulatory Visit (INDEPENDENT_AMBULATORY_CARE_PROVIDER_SITE_OTHER): Payer: BLUE CROSS/BLUE SHIELD | Admitting: Family Medicine

## 2017-10-04 VITALS — BP 112/64 | HR 75 | Temp 98.7°F | Wt 134.1 lb

## 2017-10-04 DIAGNOSIS — F411 Generalized anxiety disorder: Secondary | ICD-10-CM | POA: Diagnosis not present

## 2017-10-04 DIAGNOSIS — I471 Supraventricular tachycardia: Secondary | ICD-10-CM | POA: Diagnosis not present

## 2017-10-04 MED ORDER — CLONAZEPAM 1 MG PO TABS: 1.0000 mg | ORAL_TABLET | Freq: Every day | ORAL | 1 refills | Status: DC

## 2017-10-04 NOTE — Patient Instructions (Signed)
Labs today........ I will call a physician anything abnormal  Good luck on your surgery

## 2017-10-04 NOTE — Progress Notes (Signed)
Shelby Grant is a delightful 62 year old married female nonsmoker who comes in today for preop clearance for total right hip replacement on 11/18/2017.  She's had a history of worsening pain and decreased range of motion of her right hip over the last couple years. She's seeing Dr. Vertell Novak outside of [place]. A friend ofe and she was very happy therefore she has gone and she is happy too.Shelby Grant is always been in excellent health  She takes Wellbutrin 3 mg daily because of a history of mild depression and Klonopin 1 mg daily at bedtime for history of mild anxiety. She is on Toprol 25 mg dose one half tab twice a day because of a history of super ventricular tachycardia.  Because she is in excellent health I think she would be an ideal candidate for surgery and I can see no contraindications.  Vaccinations up-to-date  14 point review of systems reviewed and otherwise negative except for the right hip  BP 112/64 (BP Location: Left Arm, Patient Position: Sitting, Cuff Size: Normal)   Pulse 75   Temp 98.7 F (37.1 C) (Oral)   Wt 134 lb 2 oz (60.8 kg)   LMP 08/01/2012   SpO2 98%   BMI 21.65 kg/m  Examination HEENT were negative neck was supple thyroid is not enlarged cardiopulmonary exam normal gums exam normal #1 healthy female  #2 history of SVT......... Continue Toprol 12.5 mg twice a day  #3 history of mild anxiety and depression.... Continue Wellbutrin and Klonopin  #4 degenerative joint disease right hip........... Joint replacement 11/18/2017

## 2017-10-05 LAB — HEPATIC FUNCTION PANEL
ALBUMIN: 4.4 g/dL (ref 3.5–5.2)
ALT: 18 U/L (ref 0–35)
AST: 16 U/L (ref 0–37)
Alkaline Phosphatase: 50 U/L (ref 39–117)
Bilirubin, Direct: 0.1 mg/dL (ref 0.0–0.3)
TOTAL PROTEIN: 6.6 g/dL (ref 6.0–8.3)
Total Bilirubin: 0.5 mg/dL (ref 0.2–1.2)

## 2017-10-05 LAB — CBC WITH DIFFERENTIAL/PLATELET
BASOS PCT: 0.8 % (ref 0.0–3.0)
Basophils Absolute: 0.1 10*3/uL (ref 0.0–0.1)
EOS PCT: 3.1 % (ref 0.0–5.0)
Eosinophils Absolute: 0.3 10*3/uL (ref 0.0–0.7)
HEMATOCRIT: 39.3 % (ref 36.0–46.0)
HEMOGLOBIN: 13.2 g/dL (ref 12.0–15.0)
Lymphocytes Relative: 24.3 % (ref 12.0–46.0)
Lymphs Abs: 2 10*3/uL (ref 0.7–4.0)
MCHC: 33.7 g/dL (ref 30.0–36.0)
MCV: 91.8 fl (ref 78.0–100.0)
Monocytes Absolute: 0.6 10*3/uL (ref 0.1–1.0)
Monocytes Relative: 7.5 % (ref 3.0–12.0)
Neutro Abs: 5.3 10*3/uL (ref 1.4–7.7)
Neutrophils Relative %: 64.3 % (ref 43.0–77.0)
Platelets: 266 10*3/uL (ref 150.0–400.0)
RBC: 4.28 Mil/uL (ref 3.87–5.11)
RDW: 13.4 % (ref 11.5–15.5)
WBC: 8.3 10*3/uL (ref 4.0–10.5)

## 2017-10-05 LAB — HEMOGLOBIN A1C: HEMOGLOBIN A1C: 5.7 % (ref 4.6–6.5)

## 2017-10-05 LAB — BASIC METABOLIC PANEL
BUN: 19 mg/dL (ref 6–23)
CO2: 30 mEq/L (ref 19–32)
Calcium: 9.4 mg/dL (ref 8.4–10.5)
Chloride: 103 mEq/L (ref 96–112)
Creatinine, Ser: 0.86 mg/dL (ref 0.40–1.20)
GFR: 71.24 mL/min (ref >60.00)
Glucose, Bld: 77 mg/dL (ref 70–99)
POTASSIUM: 3.8 meq/L (ref 3.5–5.1)
SODIUM: 140 meq/L (ref 135–145)

## 2017-10-14 DIAGNOSIS — H0100B Unspecified blepharitis left eye, upper and lower eyelids: Secondary | ICD-10-CM | POA: Diagnosis not present

## 2017-10-14 DIAGNOSIS — H0100A Unspecified blepharitis right eye, upper and lower eyelids: Secondary | ICD-10-CM | POA: Diagnosis not present

## 2017-10-20 DIAGNOSIS — M25551 Pain in right hip: Secondary | ICD-10-CM | POA: Diagnosis not present

## 2017-10-20 DIAGNOSIS — S73199A Other sprain of unspecified hip, initial encounter: Secondary | ICD-10-CM | POA: Diagnosis not present

## 2017-10-20 DIAGNOSIS — M1611 Unilateral primary osteoarthritis, right hip: Secondary | ICD-10-CM | POA: Diagnosis not present

## 2017-10-31 DIAGNOSIS — H02413 Mechanical ptosis of bilateral eyelids: Secondary | ICD-10-CM | POA: Diagnosis not present

## 2017-10-31 DIAGNOSIS — H02403 Unspecified ptosis of bilateral eyelids: Secondary | ICD-10-CM | POA: Diagnosis not present

## 2017-10-31 DIAGNOSIS — H02412 Mechanical ptosis of left eyelid: Secondary | ICD-10-CM | POA: Diagnosis not present

## 2017-10-31 DIAGNOSIS — H02411 Mechanical ptosis of right eyelid: Secondary | ICD-10-CM | POA: Diagnosis not present

## 2017-10-31 HISTORY — PX: BLEPHAROPLASTY: SUR158

## 2017-11-15 DIAGNOSIS — N390 Urinary tract infection, site not specified: Secondary | ICD-10-CM | POA: Diagnosis not present

## 2017-11-15 DIAGNOSIS — B9620 Unspecified Escherichia coli [E. coli] as the cause of diseases classified elsewhere: Secondary | ICD-10-CM | POA: Diagnosis not present

## 2017-11-15 DIAGNOSIS — N3000 Acute cystitis without hematuria: Secondary | ICD-10-CM | POA: Diagnosis not present

## 2017-11-18 DIAGNOSIS — M1611 Unilateral primary osteoarthritis, right hip: Secondary | ICD-10-CM | POA: Diagnosis not present

## 2017-11-18 HISTORY — PX: HIP ARTHROPLASTY: SHX981

## 2017-11-29 ENCOUNTER — Telehealth: Payer: Self-pay

## 2017-11-29 NOTE — Telephone Encounter (Signed)
Copied from [place] [phone]. Topic: General - Other >> Nov 28, 2017 12:39 PM Shelby Grant wrote: Reason for CRM: Patient stated she received a call in regards to a colonoscopy.  Patient stated has had a hip replacement completed and doesn't think she can have anything completed for 6 months. Please advise

## 2017-12-05 NOTE — Telephone Encounter (Signed)
Shelby Grant ask her to call her gastroenterologist.  They will determine what kind of procedure screening study she needs

## 2017-12-06 ENCOUNTER — Encounter: Payer: Self-pay | Admitting: Family Medicine

## 2017-12-06 NOTE — Telephone Encounter (Signed)
Spoke to pt and informed her of update

## 2017-12-12 ENCOUNTER — Encounter: Payer: Self-pay | Admitting: Family Medicine

## 2017-12-13 ENCOUNTER — Encounter: Payer: Self-pay | Admitting: Family Medicine

## 2017-12-28 ENCOUNTER — Ambulatory Visit (INDEPENDENT_AMBULATORY_CARE_PROVIDER_SITE_OTHER): Payer: BLUE CROSS/BLUE SHIELD | Admitting: Family Medicine

## 2017-12-28 ENCOUNTER — Encounter: Payer: Self-pay | Admitting: Family Medicine

## 2017-12-28 VITALS — BP 110/68 | HR 70 | Temp 97.8°F | Wt 136.0 lb

## 2017-12-28 DIAGNOSIS — M810 Age-related osteoporosis without current pathological fracture: Secondary | ICD-10-CM

## 2017-12-28 DIAGNOSIS — Z96641 Presence of right artificial hip joint: Secondary | ICD-10-CM | POA: Diagnosis not present

## 2017-12-28 NOTE — Patient Instructions (Signed)
Bone Health Bones protect organs, store calcium, and anchor muscles. Good health habits, such as eating nutritious foods and exercising regularly, are important for maintaining healthy bones. They can also help to prevent a condition that causes bones to lose density and become weak and brittle (osteoporosis). Why is bone mass important? Bone mass refers to the amount of bone tissue that you have. The higher your bone mass, the stronger your bones. An important step toward having healthy bones throughout life is to have strong and dense bones during childhood. A young adult who has a high bone mass is more likely to have a high bone mass later in life. Bone mass at its greatest it is called peak bone mass. A large decline in bone mass occurs in older adults. In women, it occurs about the time of menopause. During this time, it is important to practice good health habits, because if more bone is lost than what is replaced, the bones will become less healthy and more likely to break (fracture). If you find that you have a low bone mass, you may be able to prevent osteoporosis or further bone loss by changing your diet and lifestyle. How can I find out if my bone mass is low? Bone mass can be measured with an X-ray test that is called a bone mineral density (BMD) test. This test is recommended for all women who are age 65 or older. It may also be recommended for men who are age 70 or older, or for people who are more likely to develop osteoporosis due to:  Having bones that break easily.  Having a long-term disease that weakens bones, such as kidney disease or rheumatoid arthritis.  Having menopause earlier than normal.  Taking medicine that weakens bones, such as steroids, thyroid hormones, or hormone treatment for breast cancer or prostate cancer.  Smoking.  Drinking three or more alcoholic drinks each day.  What are the nutritional recommendations for healthy bones? To have healthy bones, you  need to get enough of the right minerals and vitamins. Most nutrition experts recommend getting these nutrients from the foods that you eat. Nutritional recommendations vary from person to person. Ask your health care provider what is healthy for you. Here are some general guidelines. Calcium Recommendations Calcium is the most important (essential) mineral for bone health. Most people can get enough calcium from their diet, but supplements may be recommended for people who are at risk for osteoporosis. Good sources of calcium include:  Dairy products, such as low-fat or nonfat milk, cheese, and yogurt.  Dark green leafy vegetables, such as bok choy and broccoli.  Calcium-fortified foods, such as orange juice, cereal, bread, soy beverages, and tofu products.  Nuts, such as almonds.  Follow these recommended amounts for daily calcium intake:  Children, age 1?3: 700 mg.  Children, age 4?8: 1,000 mg.  Children, age 9?13: 1,300 mg.  Teens, age 14?18: 1,300 mg.  Adults, age 19?50: 1,000 mg.  Adults, age 51?70: ? Men: 1,000 mg. ? Women: 1,200 mg.  Adults, age 71 or older: 1,200 mg.  Pregnant and breastfeeding females: ? Teens: 1,300 mg. ? Adults: 1,000 mg.  Vitamin D Recommendations Vitamin D is the most essential vitamin for bone health. It helps the body to absorb calcium. Sunlight stimulates the skin to make vitamin D, so be sure to get enough sunlight. If you live in a cold climate or you do not get outside often, your health care provider may recommend that you take vitamin   D supplements. Good sources of vitamin D in your diet include:  Egg yolks.  Saltwater fish.  Milk and cereal fortified with vitamin D.  Follow these recommended amounts for daily vitamin D intake:  Children and teens, age 63?18: 41 international units.  Adults, age 56 or younger: 400-800 international units.  Adults, age 80 or older: 800-1,000 international units.  Other Nutrients Other nutrients  for bone health include:  Phosphorus. This mineral is found in meat, poultry, dairy foods, nuts, and legumes. The recommended daily intake for adult men and adult women is 700 mg.  Magnesium. This mineral is found in seeds, nuts, dark green vegetables, and legumes. The recommended daily intake for adult men is 400?420 mg. For adult women, it is 310?320 mg.  Vitamin K. This vitamin is found in green leafy vegetables. The recommended daily intake is 120 mg for adult men and 90 mg for adult women.  What type of physical activity is best for building and maintaining healthy bones? Weight-bearing and strength-building activities are important for building and maintaining peak bone mass. Weight-bearing activities cause muscles and bones to work against gravity. Strength-building activities increases muscle strength that supports bones. Weight-bearing and muscle-building activities include:  Walking and hiking.  Jogging and running.  Dancing.  Gym exercises.  Lifting weights.  Tennis and racquetball.  Climbing stairs.  Aerobics.  Adults should get at least 30 minutes of moderate physical activity on most days. Children should get at least 60 minutes of moderate physical activity on most days. Ask your health care provide what type of exercise is best for you. Where can I find more information? For more information, check out the following websites:  [place]: YardHomes.se  [employer] of Health: http://www.niams.AnonymousEar.fr.asp  This information is not intended to replace advice given to you by your health care provider. Make sure you discuss any questions you have with your health care provider. Document Released: 04/10/2003 Document Revised: 08/08/2015 Document Reviewed: 01/23/2014 Elsevier Interactive Patient Education  2018 [employer].  Osteoporosis Osteoporosis is the thinning and  loss of density in the bones. Osteoporosis makes the bones more brittle, fragile, and likely to break (fracture). Over time, osteoporosis can cause the bones to become so weak that they fracture after a simple fall. The bones most likely to fracture are the bones in the hip, wrist, and spine. What are the causes? The exact cause is not known. What increases the risk? Anyone can develop osteoporosis. You may be at greater risk if you have a family history of the condition or have poor nutrition. You may also have a higher risk if you are:  Female.  60 years old or older.  A smoker.  Not physically active.  White or Asian.  Slender.  What are the signs or symptoms? A fracture might be the first sign of the disease, especially if it results from a fall or injury that would not usually cause a bone to break. Other signs and symptoms include:  Low back and neck pain.  Stooped posture.  Height loss.  How is this diagnosed? To make a diagnosis, your health care provider may:  Take a medical history.  Perform a physical exam.  Order tests, such as: ? A bone mineral density test. ? A dual-energy X-ray absorptiometry test.  How is this treated? The goal of osteoporosis treatment is to strengthen your bones to reduce your risk of a fracture. Treatment may involve:  Making lifestyle changes, such as: ? Eating a  diet rich in calcium. ? Doing weight-bearing and muscle-strengthening exercises. ? Stopping tobacco use. ? Limiting alcohol intake.  Taking medicine to slow the process of bone loss or to increase bone density.  Monitoring your levels of calcium and vitamin D.  Follow these instructions at home:  Include calcium and vitamin D in your diet. Calcium is important for bone health, and vitamin D helps the body absorb calcium.  Perform weight-bearing and muscle-strengthening exercises as directed by your health care provider.  Do not use any tobacco products, including  cigarettes, chewing tobacco, and electronic cigarettes. If you need help quitting, ask your health care provider.  Limit your alcohol intake.  Take medicines only as directed by your health care provider.  Keep all follow-up visits as directed by your health care provider. This is important.  Take precautions at home to lower your risk of falling, such as: ? Keeping rooms well lit and clutter free. ? Installing safety rails on stairs. ? Using rubber mats in the bathroom and other areas that are often wet or slippery. Get help right away if: You fall or injure yourself. This information is not intended to replace advice given to you by your health care provider. Make sure you discuss any questions you have with your health care provider. Document Released: 10/28/2004 Document Revised: 06/23/2015 Document Reviewed: 06/28/2013 Elsevier Interactive Patient Education  [employer].

## 2017-12-28 NOTE — Progress Notes (Signed)
Subjective:    Patient ID: Shelby Grant, female    DOB: [birth year], 62 y.o.   MRN: [id number]  No chief complaint on file.   HPI Patient was seen today for follow-up on ongoing concerns.  Pt has yet to decide on a provider to transfer care.  Previously seen by Dr. Sherren Mocha.  Osteoporosis: -noted on bone density in 2016 -was supposed to f/u in 1 yr -would like referral -taking OTC Vit D and Calcium supplements -also eating more foods rich in Ca2+ and Vit D, but concerned about weight gain.  S/p R THR -had R hip replacement in [place], [place] -ambulating with a limp -doing PT at home.  Checks in with surgeon -has upcoming f/u with her surgeon   Past Medical History:  Diagnosis Date  . ALLERGIC RHINITIS 07/03/2008  . ANXIETY 06/02/2007  . Arthritis   . BELL'S PALSY, LEFT 12/02/2008  . Cancer ([place])   . CELLULITIS, LEFT LEG 09/14/2008  . CHEST PAIN 07/07/2009  . Complication of anesthesia 08-28-2014   "work up in recovery room with jaw locked open"  . CORNS AND CALLUSES 07/03/2008  . Depression   . FOOT PAIN 09/22/2009  . History of kidney cancer   . IRREGULAR MENSES 06/03/2008  . Neoplasm    left renal  . OCD (obsessive compulsive disorder)   . Renal disorder   . Stress fracture of ankle since Jun 29, 2014   left  . Supraventricular tachycardia ([place]) 06/02/2007   Atrial tachycardia with Wenckebach periodicity    Allergies  Allergen Reactions  . Latex     REACTION: rash  . Amoxicillin Rash    Has patient had a PCN reaction causing immediate rash, facial/tongue/throat swelling, SOB or lightheadedness with hypotension: unkown Has patient had a PCN reaction causing severe rash involving mucus membranes or skin necrosis: no Has patient had a PCN reaction that required hospitalization No Has patient had a PCN reaction occurring within the last 10 years: unknown If all of the above answers are "NO", then may proceed with Cephalosporin use.    ROS General: Denies fever, chills, night  sweats, changes in weight, changes in appetite HEENT: Denies headaches, ear pain, changes in vision, rhinorrhea, sore throat CV: Denies CP, palpitations, SOB, orthopnea Pulm: Denies SOB, cough, wheezing GI: Denies abdominal pain, nausea, vomiting, diarrhea, constipation GU: Denies dysuria, hematuria, frequency, vaginal discharge Msk: Denies muscle cramps, joint pains  +R hip replacement Neuro: Denies weakness, numbness, tingling Skin: Denies rashes, bruising Psych: Denies depression, anxiety, hallucinations    Objective:    Blood pressure 110/68, pulse 70, temperature 97.8 F (36.6 C), temperature source Oral, weight 136 lb (61.7 kg), last menstrual period 08/01/2012, SpO2 97 %.   Gen. Pleasant, well-nourished, in no distress, normal affect  HEENT: [place]/AT, face symmetric, no scleral icterus, PERRLA, nares patent without drainage Lungs: no accessory muscle use Cardiovascular: RRR, no peripheral edema Neuro:  A&Ox3, CN II-XII intact, ambulating with a limp Skin:  Warm, no lesions/ rash  Wt Readings from Last 3 Encounters:  12/28/17 136 lb (61.7 kg)  10/04/17 134 lb 2 oz (60.8 kg)  03/29/17 136 lb (61.7 kg)    Lab Results  Component Value Date   WBC 8.3 10/04/2017   HGB 13.2 10/04/2017   HCT 39.3 10/04/2017   PLT 266.0 10/04/2017   GLUCOSE 77 10/04/2017   CHOL 252 (H) 03/29/2017   TRIG 73.0 03/29/2017   HDL 77.80 03/29/2017   LDLDIRECT 137.5 10/06/2012   LDLCALC 160 (H) 03/29/2017  ALT 18 10/04/2017   AST 16 10/04/2017   NA 140 10/04/2017   K 3.8 10/04/2017   CL 103 10/04/2017   CREATININE 0.86 10/04/2017   BUN 19 10/04/2017   CO2 30 10/04/2017   TSH 1.57 03/29/2017   HGBA1C 5.7 10/04/2017    Assessment/Plan:  Osteoporosis without current pathological fracture, unspecified osteoporosis type  -given handouts - Plan: DG Bone Density  Status post right hip replacement -continue f/u with Ortho -continue PT  Pt to find a pcp to transfer care.    Grier Mitts, MD

## 2017-12-30 ENCOUNTER — Encounter: Payer: Self-pay | Admitting: Family Medicine

## 2018-01-03 ENCOUNTER — Telehealth: Payer: Self-pay | Admitting: Family Medicine

## 2018-01-03 ENCOUNTER — Ambulatory Visit (INDEPENDENT_AMBULATORY_CARE_PROVIDER_SITE_OTHER)
Admission: RE | Admit: 2018-01-03 | Discharge: 2018-01-03 | Disposition: A | Discharge status: Home / Self Care | Payer: BLUE CROSS/BLUE SHIELD | Source: Ambulatory Visit | Attending: Family Medicine | Admitting: Family Medicine

## 2018-01-03 DIAGNOSIS — M810 Age-related osteoporosis without current pathological fracture: Secondary | ICD-10-CM | POA: Diagnosis not present

## 2018-01-03 NOTE — Telephone Encounter (Signed)
Copied from [place] [phone]. Topic: Quick Communication - See Telephone Encounter >> Jan 03, 2018  9:37 AM Robina Ade, Helene Kelp D wrote: CRM for notification. See Telephone encounter for: 01/03/18. Pt called and said that she would like to a call back from Dr. Volanda Napoleon regarding her bone density results.

## 2018-01-04 NOTE — Telephone Encounter (Signed)
Please advise 

## 2018-01-05 DIAGNOSIS — M25551 Pain in right hip: Secondary | ICD-10-CM | POA: Diagnosis not present

## 2018-01-09 NOTE — Telephone Encounter (Signed)
Pt can further discuss the results of her bone density with her new pcp when she makes an appt with them.

## 2018-01-11 ENCOUNTER — Encounter: Payer: BLUE CROSS/BLUE SHIELD | Admitting: Family Medicine

## 2018-01-11 NOTE — Telephone Encounter (Signed)
Called pt left a message to return my call in the office 

## 2018-01-16 NOTE — Telephone Encounter (Signed)
Called pt left a detailed message on pt voicemail.

## 2018-01-17 DIAGNOSIS — K130 Diseases of lips: Secondary | ICD-10-CM | POA: Diagnosis not present

## 2018-01-17 DIAGNOSIS — L080 Pyoderma: Secondary | ICD-10-CM | POA: Diagnosis not present

## 2018-01-27 DIAGNOSIS — M25551 Pain in right hip: Secondary | ICD-10-CM | POA: Diagnosis not present

## 2018-01-27 DIAGNOSIS — R262 Difficulty in walking, not elsewhere classified: Secondary | ICD-10-CM | POA: Diagnosis not present

## 2018-01-27 DIAGNOSIS — M6281 Muscle weakness (generalized): Secondary | ICD-10-CM | POA: Diagnosis not present

## 2018-01-27 DIAGNOSIS — M25651 Stiffness of right hip, not elsewhere classified: Secondary | ICD-10-CM | POA: Diagnosis not present

## 2018-02-07 DIAGNOSIS — M25651 Stiffness of right hip, not elsewhere classified: Secondary | ICD-10-CM | POA: Diagnosis not present

## 2018-02-07 DIAGNOSIS — M25551 Pain in right hip: Secondary | ICD-10-CM | POA: Diagnosis not present

## 2018-02-07 DIAGNOSIS — R262 Difficulty in walking, not elsewhere classified: Secondary | ICD-10-CM | POA: Diagnosis not present

## 2018-02-07 DIAGNOSIS — M6281 Muscle weakness (generalized): Secondary | ICD-10-CM | POA: Diagnosis not present

## 2018-02-10 DIAGNOSIS — M25651 Stiffness of right hip, not elsewhere classified: Secondary | ICD-10-CM | POA: Diagnosis not present

## 2018-02-10 DIAGNOSIS — R262 Difficulty in walking, not elsewhere classified: Secondary | ICD-10-CM | POA: Diagnosis not present

## 2018-02-10 DIAGNOSIS — M25551 Pain in right hip: Secondary | ICD-10-CM | POA: Diagnosis not present

## 2018-02-10 DIAGNOSIS — M6281 Muscle weakness (generalized): Secondary | ICD-10-CM | POA: Diagnosis not present

## 2018-02-14 DIAGNOSIS — M25651 Stiffness of right hip, not elsewhere classified: Secondary | ICD-10-CM | POA: Diagnosis not present

## 2018-02-14 DIAGNOSIS — M25551 Pain in right hip: Secondary | ICD-10-CM | POA: Diagnosis not present

## 2018-02-14 DIAGNOSIS — R262 Difficulty in walking, not elsewhere classified: Secondary | ICD-10-CM | POA: Diagnosis not present

## 2018-02-14 DIAGNOSIS — M6281 Muscle weakness (generalized): Secondary | ICD-10-CM | POA: Diagnosis not present

## 2018-02-16 DIAGNOSIS — M25651 Stiffness of right hip, not elsewhere classified: Secondary | ICD-10-CM | POA: Diagnosis not present

## 2018-02-16 DIAGNOSIS — M6281 Muscle weakness (generalized): Secondary | ICD-10-CM | POA: Diagnosis not present

## 2018-02-16 DIAGNOSIS — M25551 Pain in right hip: Secondary | ICD-10-CM | POA: Diagnosis not present

## 2018-02-16 DIAGNOSIS — R262 Difficulty in walking, not elsewhere classified: Secondary | ICD-10-CM | POA: Diagnosis not present

## 2018-02-21 DIAGNOSIS — M6281 Muscle weakness (generalized): Secondary | ICD-10-CM | POA: Diagnosis not present

## 2018-02-21 DIAGNOSIS — M25551 Pain in right hip: Secondary | ICD-10-CM | POA: Diagnosis not present

## 2018-02-21 DIAGNOSIS — R262 Difficulty in walking, not elsewhere classified: Secondary | ICD-10-CM | POA: Diagnosis not present

## 2018-02-21 DIAGNOSIS — M25651 Stiffness of right hip, not elsewhere classified: Secondary | ICD-10-CM | POA: Diagnosis not present

## 2018-02-23 DIAGNOSIS — L308 Other specified dermatitis: Secondary | ICD-10-CM | POA: Diagnosis not present

## 2018-02-23 DIAGNOSIS — K130 Diseases of lips: Secondary | ICD-10-CM | POA: Diagnosis not present

## 2018-02-24 DIAGNOSIS — M25651 Stiffness of right hip, not elsewhere classified: Secondary | ICD-10-CM | POA: Diagnosis not present

## 2018-02-24 DIAGNOSIS — R262 Difficulty in walking, not elsewhere classified: Secondary | ICD-10-CM | POA: Diagnosis not present

## 2018-02-24 DIAGNOSIS — M25551 Pain in right hip: Secondary | ICD-10-CM | POA: Diagnosis not present

## 2018-02-24 DIAGNOSIS — M6281 Muscle weakness (generalized): Secondary | ICD-10-CM | POA: Diagnosis not present

## 2018-02-28 DIAGNOSIS — M25551 Pain in right hip: Secondary | ICD-10-CM | POA: Diagnosis not present

## 2018-02-28 DIAGNOSIS — M25651 Stiffness of right hip, not elsewhere classified: Secondary | ICD-10-CM | POA: Diagnosis not present

## 2018-02-28 DIAGNOSIS — M6281 Muscle weakness (generalized): Secondary | ICD-10-CM | POA: Diagnosis not present

## 2018-02-28 DIAGNOSIS — R262 Difficulty in walking, not elsewhere classified: Secondary | ICD-10-CM | POA: Diagnosis not present

## 2018-03-02 DIAGNOSIS — R262 Difficulty in walking, not elsewhere classified: Secondary | ICD-10-CM | POA: Diagnosis not present

## 2018-03-02 DIAGNOSIS — M25551 Pain in right hip: Secondary | ICD-10-CM | POA: Diagnosis not present

## 2018-03-02 DIAGNOSIS — M6281 Muscle weakness (generalized): Secondary | ICD-10-CM | POA: Diagnosis not present

## 2018-03-02 DIAGNOSIS — M25651 Stiffness of right hip, not elsewhere classified: Secondary | ICD-10-CM | POA: Diagnosis not present

## 2018-03-07 DIAGNOSIS — M25651 Stiffness of right hip, not elsewhere classified: Secondary | ICD-10-CM | POA: Diagnosis not present

## 2018-03-07 DIAGNOSIS — M25551 Pain in right hip: Secondary | ICD-10-CM | POA: Diagnosis not present

## 2018-03-07 DIAGNOSIS — M6281 Muscle weakness (generalized): Secondary | ICD-10-CM | POA: Diagnosis not present

## 2018-03-07 DIAGNOSIS — R262 Difficulty in walking, not elsewhere classified: Secondary | ICD-10-CM | POA: Diagnosis not present

## 2018-03-09 DIAGNOSIS — M25551 Pain in right hip: Secondary | ICD-10-CM | POA: Diagnosis not present

## 2018-03-09 DIAGNOSIS — M25651 Stiffness of right hip, not elsewhere classified: Secondary | ICD-10-CM | POA: Diagnosis not present

## 2018-03-09 DIAGNOSIS — M6281 Muscle weakness (generalized): Secondary | ICD-10-CM | POA: Diagnosis not present

## 2018-03-09 DIAGNOSIS — R262 Difficulty in walking, not elsewhere classified: Secondary | ICD-10-CM | POA: Diagnosis not present

## 2018-03-14 DIAGNOSIS — M25551 Pain in right hip: Secondary | ICD-10-CM | POA: Diagnosis not present

## 2018-03-14 DIAGNOSIS — R262 Difficulty in walking, not elsewhere classified: Secondary | ICD-10-CM | POA: Diagnosis not present

## 2018-03-14 DIAGNOSIS — M6281 Muscle weakness (generalized): Secondary | ICD-10-CM | POA: Diagnosis not present

## 2018-03-14 DIAGNOSIS — M25651 Stiffness of right hip, not elsewhere classified: Secondary | ICD-10-CM | POA: Diagnosis not present

## 2018-03-14 DIAGNOSIS — Z85528 Personal history of other malignant neoplasm of kidney: Secondary | ICD-10-CM | POA: Diagnosis not present

## 2018-03-16 DIAGNOSIS — M6281 Muscle weakness (generalized): Secondary | ICD-10-CM | POA: Diagnosis not present

## 2018-03-16 DIAGNOSIS — M25651 Stiffness of right hip, not elsewhere classified: Secondary | ICD-10-CM | POA: Diagnosis not present

## 2018-03-16 DIAGNOSIS — R262 Difficulty in walking, not elsewhere classified: Secondary | ICD-10-CM | POA: Diagnosis not present

## 2018-03-16 DIAGNOSIS — M25551 Pain in right hip: Secondary | ICD-10-CM | POA: Diagnosis not present

## 2018-03-21 ENCOUNTER — Ambulatory Visit (HOSPITAL_COMMUNITY)
Admission: RE | Admit: 2018-03-21 | Discharge: 2018-03-21 | Disposition: A | Discharge status: Home / Self Care | Payer: BLUE CROSS/BLUE SHIELD | Source: Ambulatory Visit | Attending: Urology | Admitting: Urology

## 2018-03-21 ENCOUNTER — Other Ambulatory Visit (HOSPITAL_COMMUNITY): Payer: Self-pay | Admitting: Urology

## 2018-03-21 DIAGNOSIS — M6281 Muscle weakness (generalized): Secondary | ICD-10-CM | POA: Diagnosis not present

## 2018-03-21 DIAGNOSIS — C649 Malignant neoplasm of unspecified kidney, except renal pelvis: Secondary | ICD-10-CM | POA: Diagnosis not present

## 2018-03-21 DIAGNOSIS — Z85528 Personal history of other malignant neoplasm of kidney: Secondary | ICD-10-CM

## 2018-03-21 DIAGNOSIS — M25551 Pain in right hip: Secondary | ICD-10-CM | POA: Diagnosis not present

## 2018-03-21 DIAGNOSIS — R262 Difficulty in walking, not elsewhere classified: Secondary | ICD-10-CM | POA: Diagnosis not present

## 2018-03-21 DIAGNOSIS — K7689 Other specified diseases of liver: Secondary | ICD-10-CM | POA: Diagnosis not present

## 2018-03-21 DIAGNOSIS — M25651 Stiffness of right hip, not elsewhere classified: Secondary | ICD-10-CM | POA: Diagnosis not present

## 2018-03-23 DIAGNOSIS — M25551 Pain in right hip: Secondary | ICD-10-CM | POA: Diagnosis not present

## 2018-03-23 DIAGNOSIS — R262 Difficulty in walking, not elsewhere classified: Secondary | ICD-10-CM | POA: Diagnosis not present

## 2018-03-23 DIAGNOSIS — M6281 Muscle weakness (generalized): Secondary | ICD-10-CM | POA: Diagnosis not present

## 2018-03-23 DIAGNOSIS — M25651 Stiffness of right hip, not elsewhere classified: Secondary | ICD-10-CM | POA: Diagnosis not present

## 2018-03-28 DIAGNOSIS — M25551 Pain in right hip: Secondary | ICD-10-CM | POA: Diagnosis not present

## 2018-03-28 DIAGNOSIS — R262 Difficulty in walking, not elsewhere classified: Secondary | ICD-10-CM | POA: Diagnosis not present

## 2018-03-28 DIAGNOSIS — M6281 Muscle weakness (generalized): Secondary | ICD-10-CM | POA: Diagnosis not present

## 2018-03-28 DIAGNOSIS — Z85528 Personal history of other malignant neoplasm of kidney: Secondary | ICD-10-CM | POA: Diagnosis not present

## 2018-03-28 DIAGNOSIS — M25651 Stiffness of right hip, not elsewhere classified: Secondary | ICD-10-CM | POA: Diagnosis not present

## 2018-03-30 DIAGNOSIS — R262 Difficulty in walking, not elsewhere classified: Secondary | ICD-10-CM | POA: Diagnosis not present

## 2018-03-30 DIAGNOSIS — M25551 Pain in right hip: Secondary | ICD-10-CM | POA: Diagnosis not present

## 2018-03-30 DIAGNOSIS — M25651 Stiffness of right hip, not elsewhere classified: Secondary | ICD-10-CM | POA: Diagnosis not present

## 2018-03-30 DIAGNOSIS — M6281 Muscle weakness (generalized): Secondary | ICD-10-CM | POA: Diagnosis not present

## 2018-04-03 ENCOUNTER — Ambulatory Visit: Payer: BLUE CROSS/BLUE SHIELD | Admitting: Family Medicine

## 2018-06-09 ENCOUNTER — Telehealth: Payer: Self-pay

## 2018-06-09 NOTE — Telephone Encounter (Signed)
Pt is requesting for refills on her Bupropion last refilled by Dr Sherren Mocha, please advise if ok to send refill

## 2018-06-09 NOTE — Telephone Encounter (Signed)
Per office note, at time of last visit pt did not establish with this provider.  Pt needs to have her pcp refill the med.

## 2018-06-15 NOTE — Telephone Encounter (Signed)
Called pt left a VM with Dr Volanda Napoleon advise to transfer her care with a provider so they can refill pt medication.

## 2018-06-28 ENCOUNTER — Telehealth: Payer: Self-pay | Admitting: Family Medicine

## 2018-06-28 NOTE — Telephone Encounter (Signed)
Copied from [place] [phone]. Topic: Quick Communication - See Telephone Encounter >> Jun 28, 2018 11:14 AM Vernona Rieger wrote: CRM for notification. See Telephone encounter for: 06/28/18 Patient states her script for buPROPion (WELLBUTRIN XL) 300 MG 24 hr tablet was denied and she would like to check on that?   Also, she said if Dr Volanda Napoleon can not prescribed the medications that she takes, she needs to find a new pcp. If she can prescribe what she takes she will continue her care with Dr Volanda Napoleon and would like someone to call her to schedule her a physical. She said that her husband was a former patient of Dr Sherren Mocha and Dr Jenny Reichmann at [facility] just accepted him as a new patient. She said she would be interested in him as her new pcp if Dr Volanda Napoleon can not do this.  Please Advise.

## 2018-06-29 NOTE — Telephone Encounter (Signed)
Pt last seen by this provider in Nov 2019.  Pt should schedule a  follow up visit as soon as possible as certain medications may require regular follow up to assess if adjustments should be made.

## 2018-06-29 NOTE — Telephone Encounter (Signed)
Please advise 

## 2018-06-29 NOTE — Telephone Encounter (Signed)
Also pt never transferred care to this provider.  See Nov. 2019 office note.

## 2018-06-29 NOTE — Telephone Encounter (Signed)
Called pt the 2nd time left a VM for pt to return my call in the office regarding her medication/ CPE appointment

## 2018-06-30 NOTE — Telephone Encounter (Signed)
Called pt left a detailed VM with Dr Volanda Napoleon recommendations on pt prescription for Wellbutrin refill. Pt advised to call office for an appointment to establish care before any further refills

## 2018-07-13 IMAGING — DX DG CHEST 2V
2 series · 2 of 2 positions shown · non-contrast
Comparison: Chest CT 11/15/2016.  Sternal radiographs 11/15/2016.

CLINICAL DATA: Fall 8 days ago.  Chest contusion.  Slight dyspnea.

EXAM:
CHEST  2 VIEW

[chest pa]
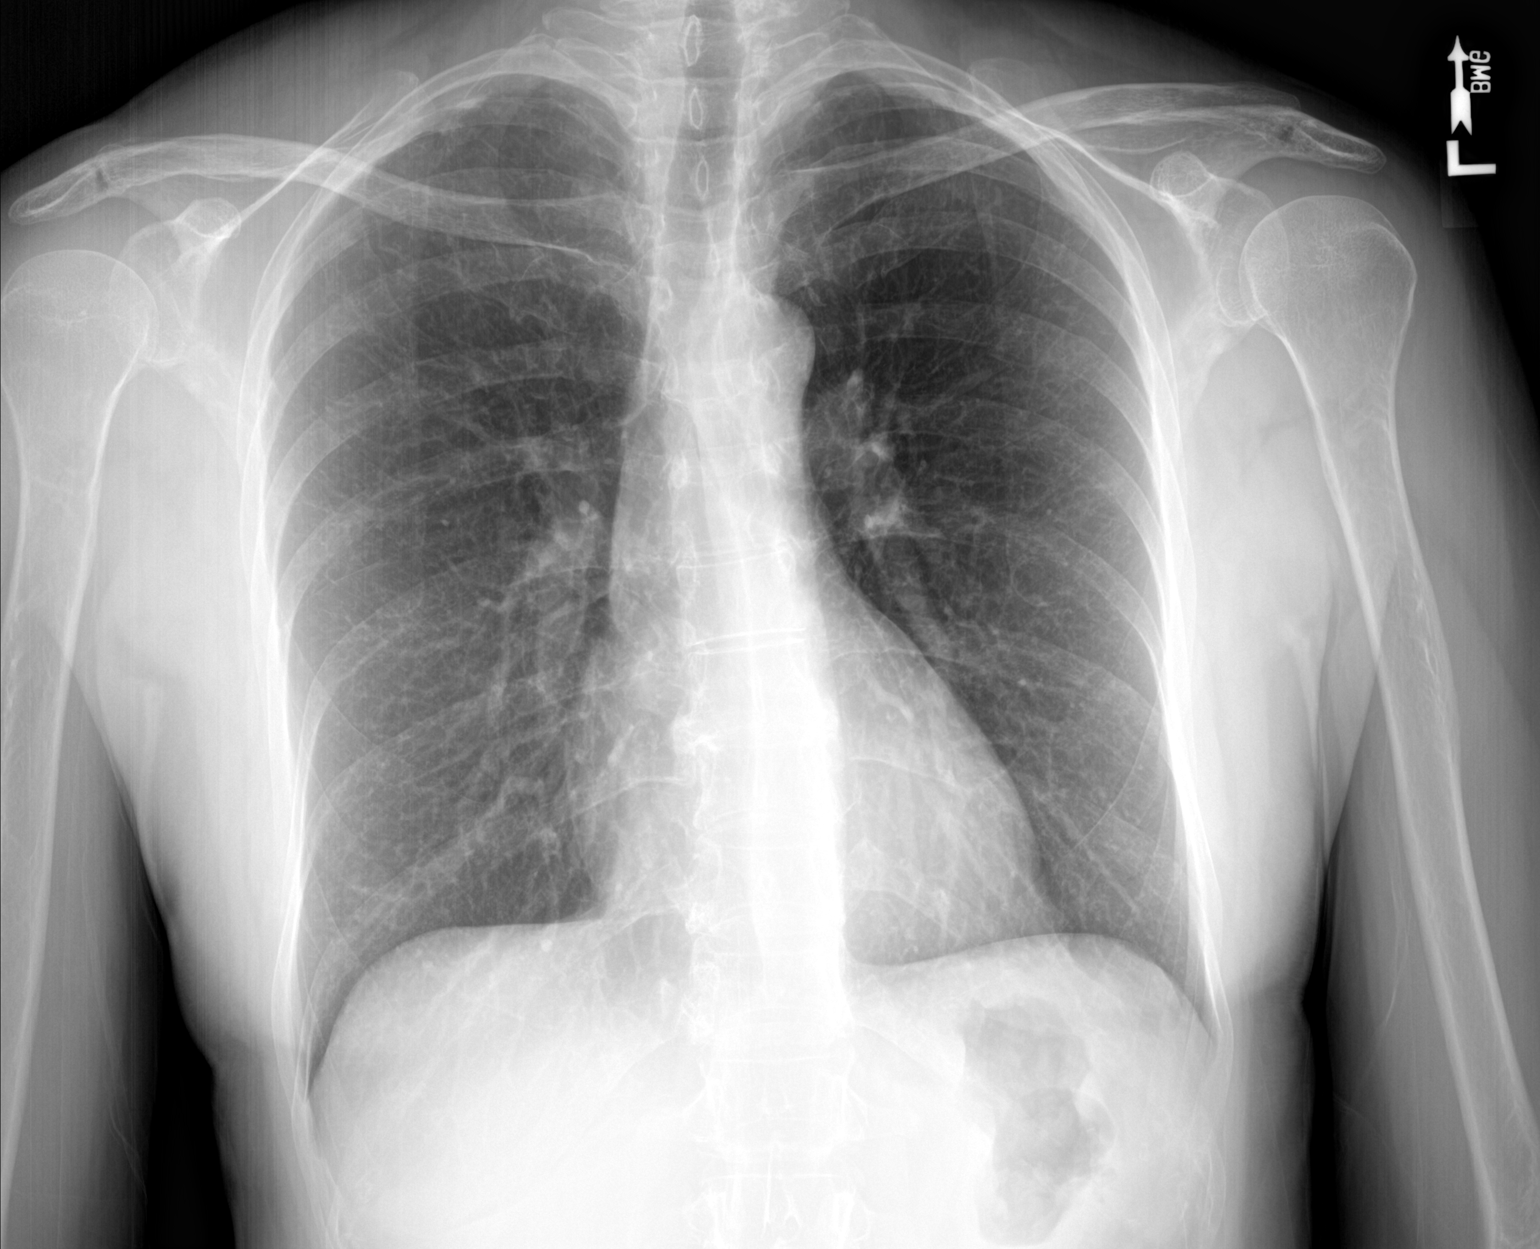

[chest lat]
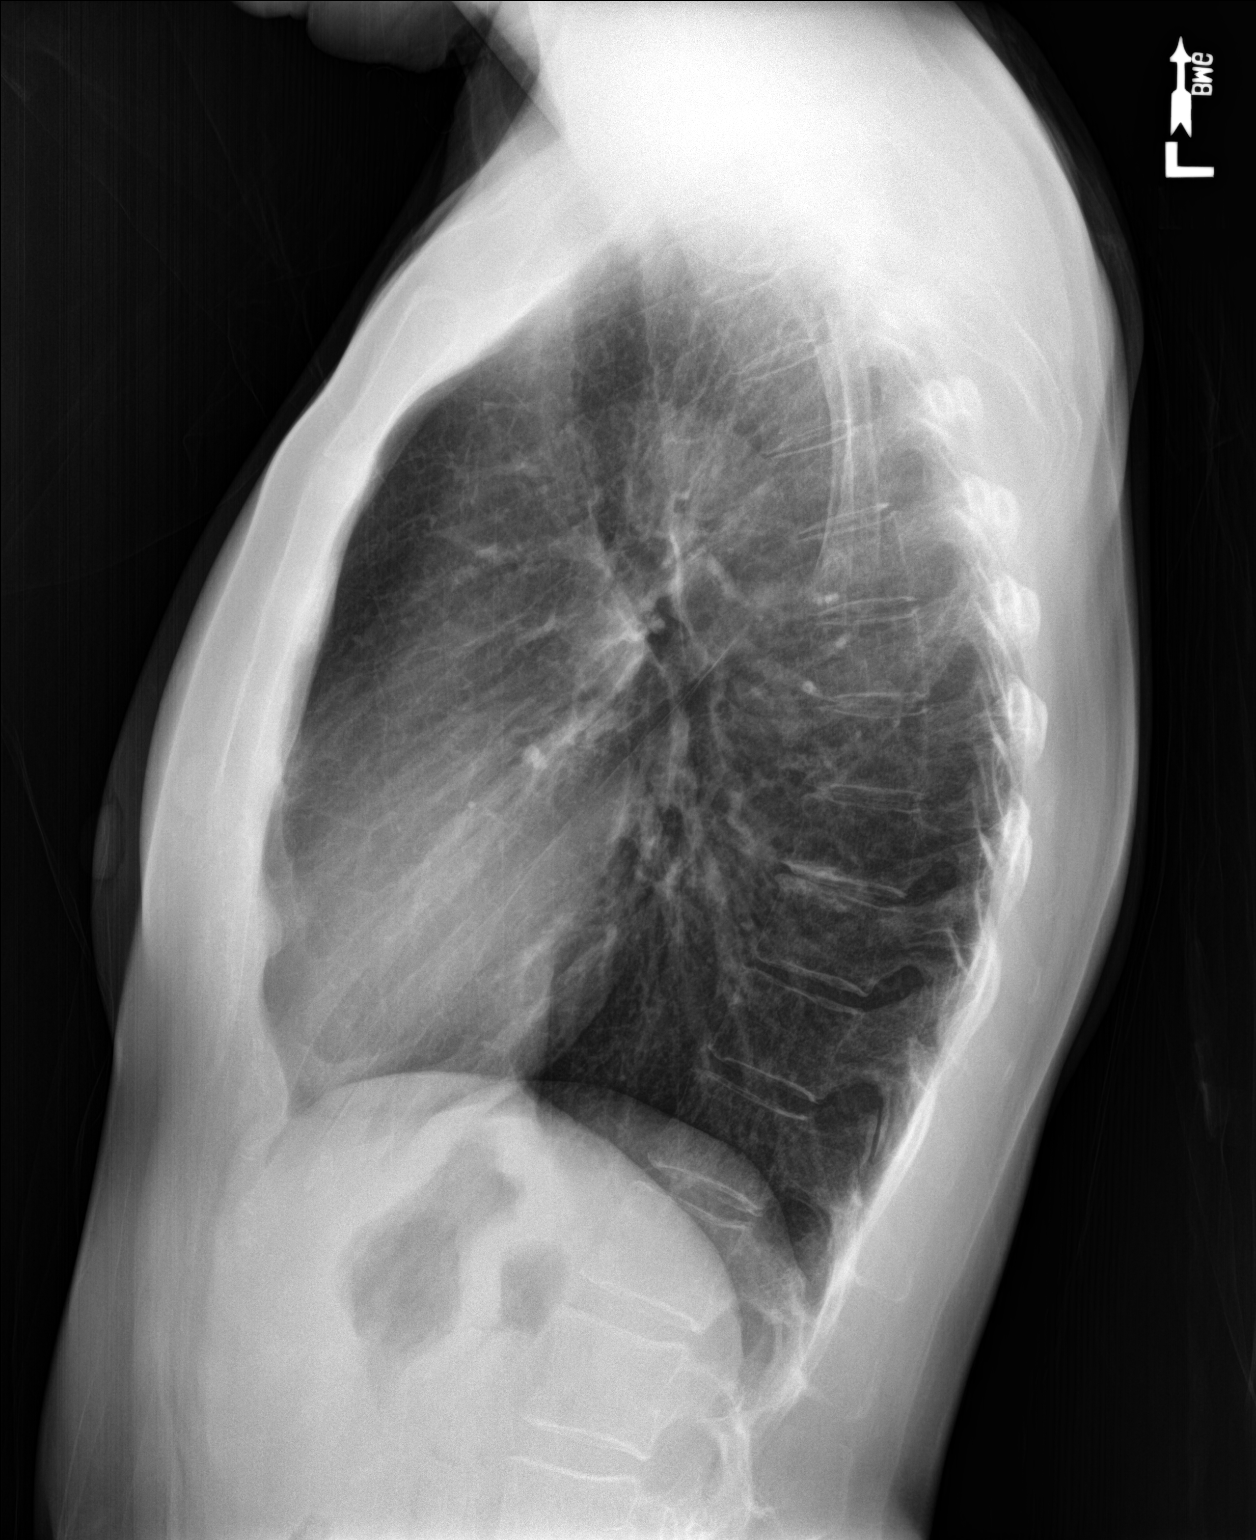

[2 of 2 positions shown; findings below may reference images not displayed]

FINDINGS: The heart size and mediastinal contours are stable. There is stable
mild biapical scarring. The lungs are otherwise clear. There is no
pleural effusion or pneumothorax. No fractures are evident.
IMPRESSION: Stable chest.  No acute cardiopulmonary process.

## 2018-07-13 IMAGING — DX DG TIBIA/FIBULA 2V*L*
3 series · 3 of 3 positions shown · non-contrast
Comparison: Left ankle radiographs 05/30/2014

CLINICAL DATA: Fall on 11/24/2016. Anterior left lower leg pain.
Initial encounter.

EXAM:
LEFT TIBIA AND FIBULA - 2 VIEW

[tibia ap (1 of 2)]
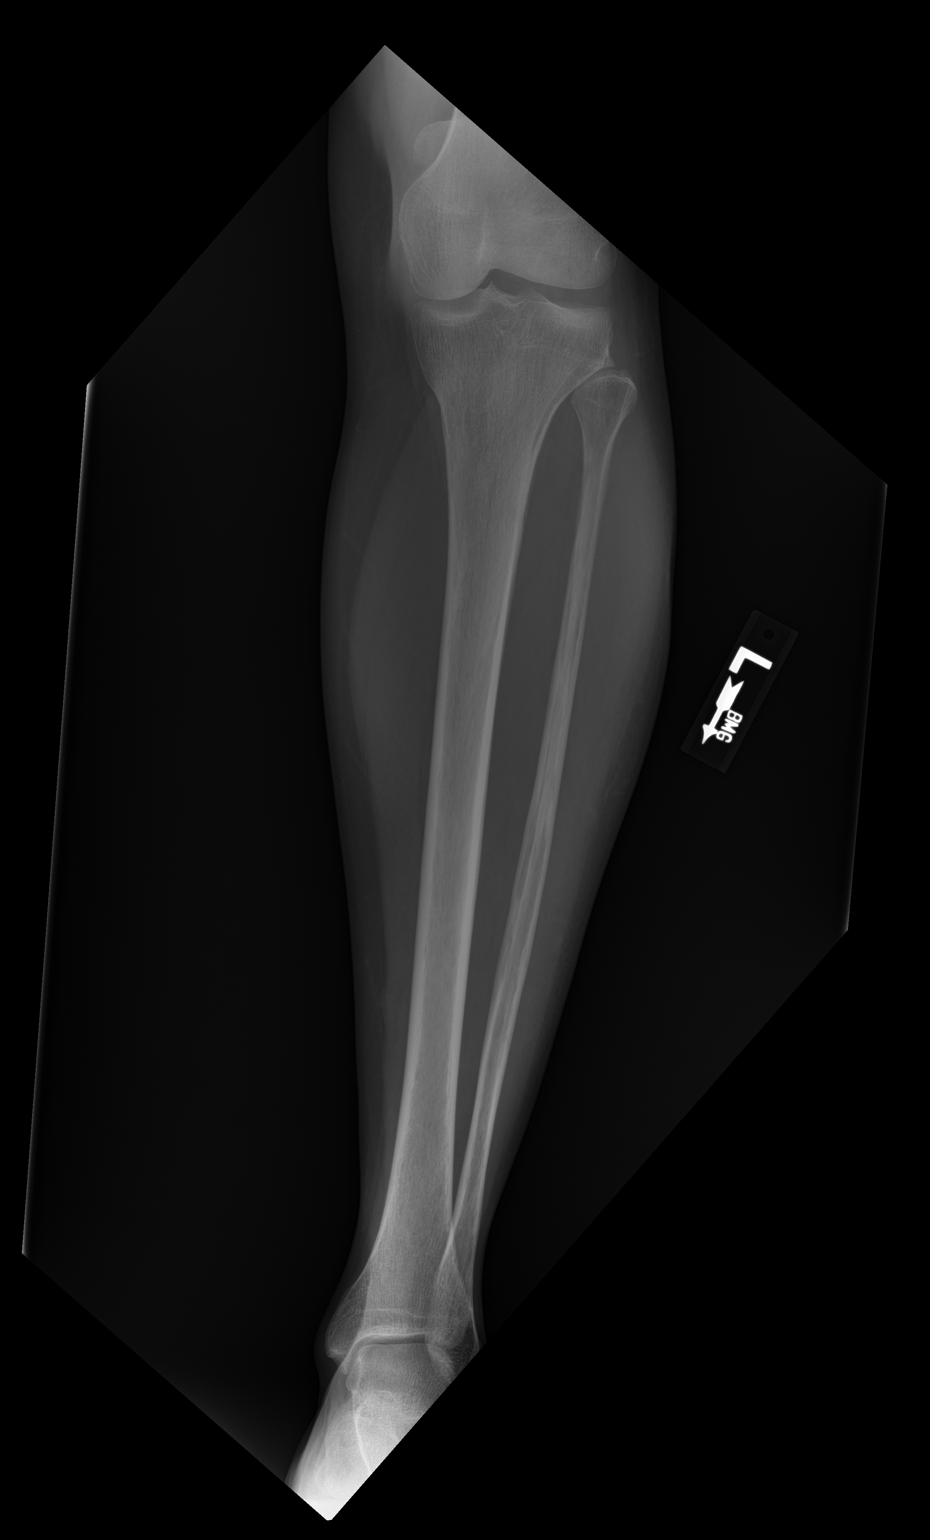

[tibia ap (2 of 2)]
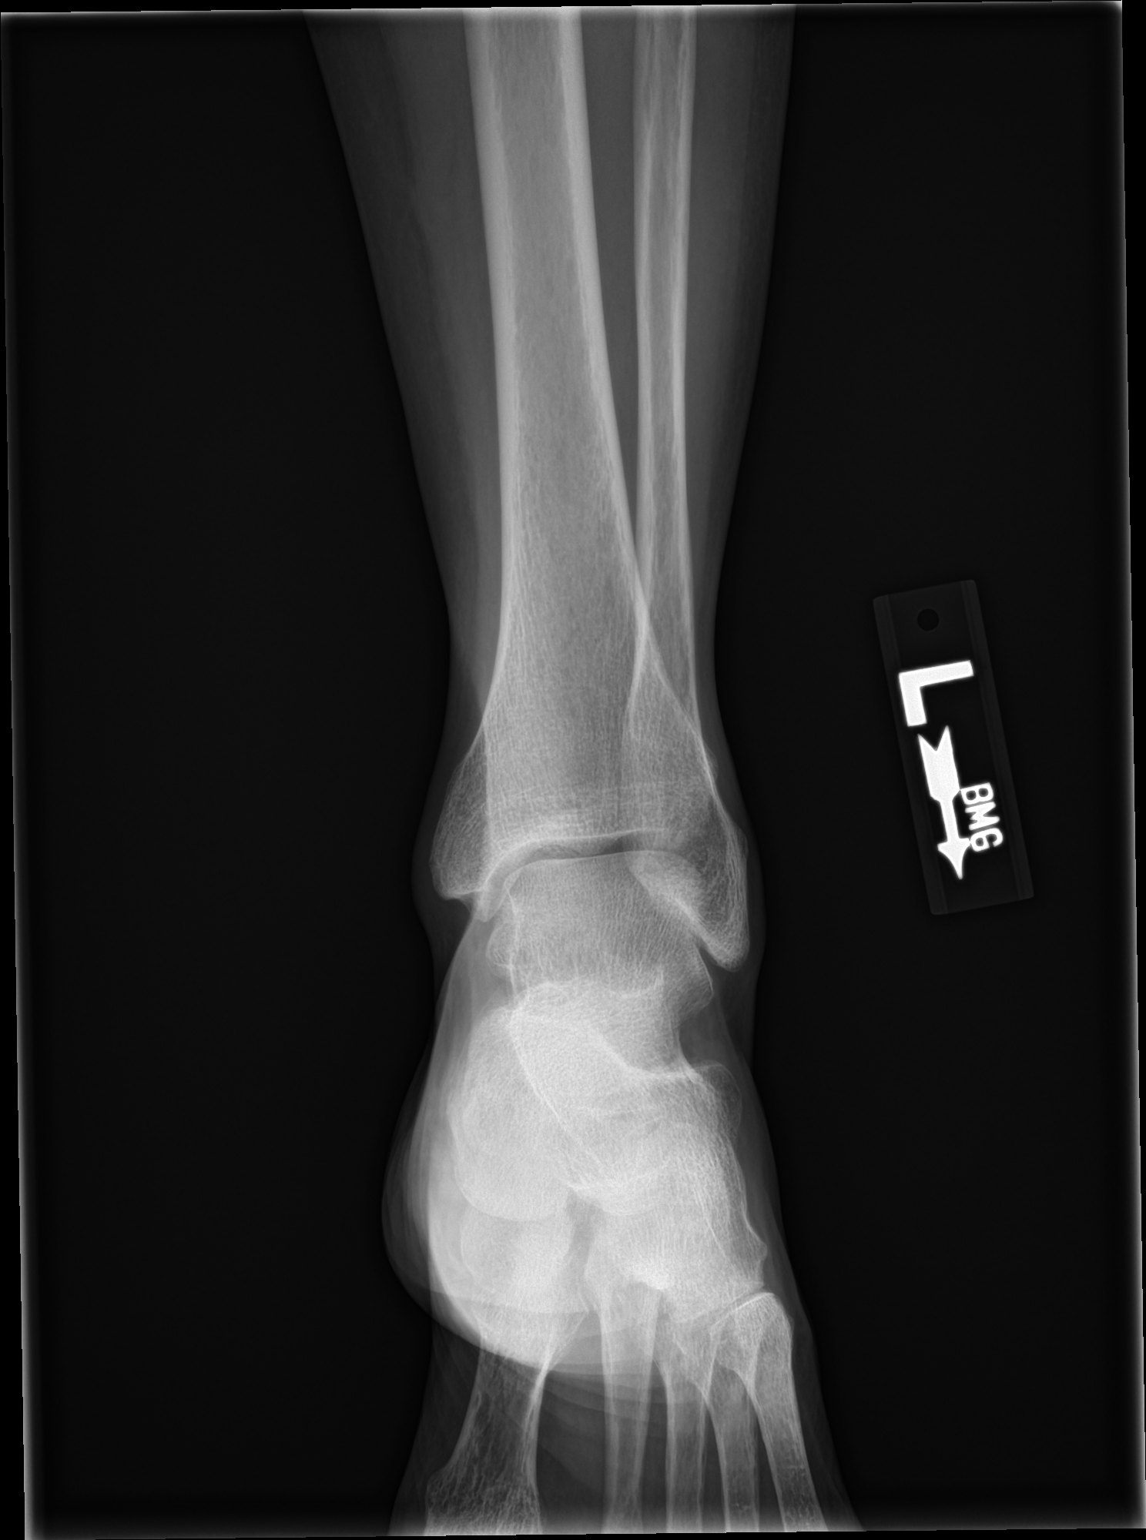

[tibia lat]
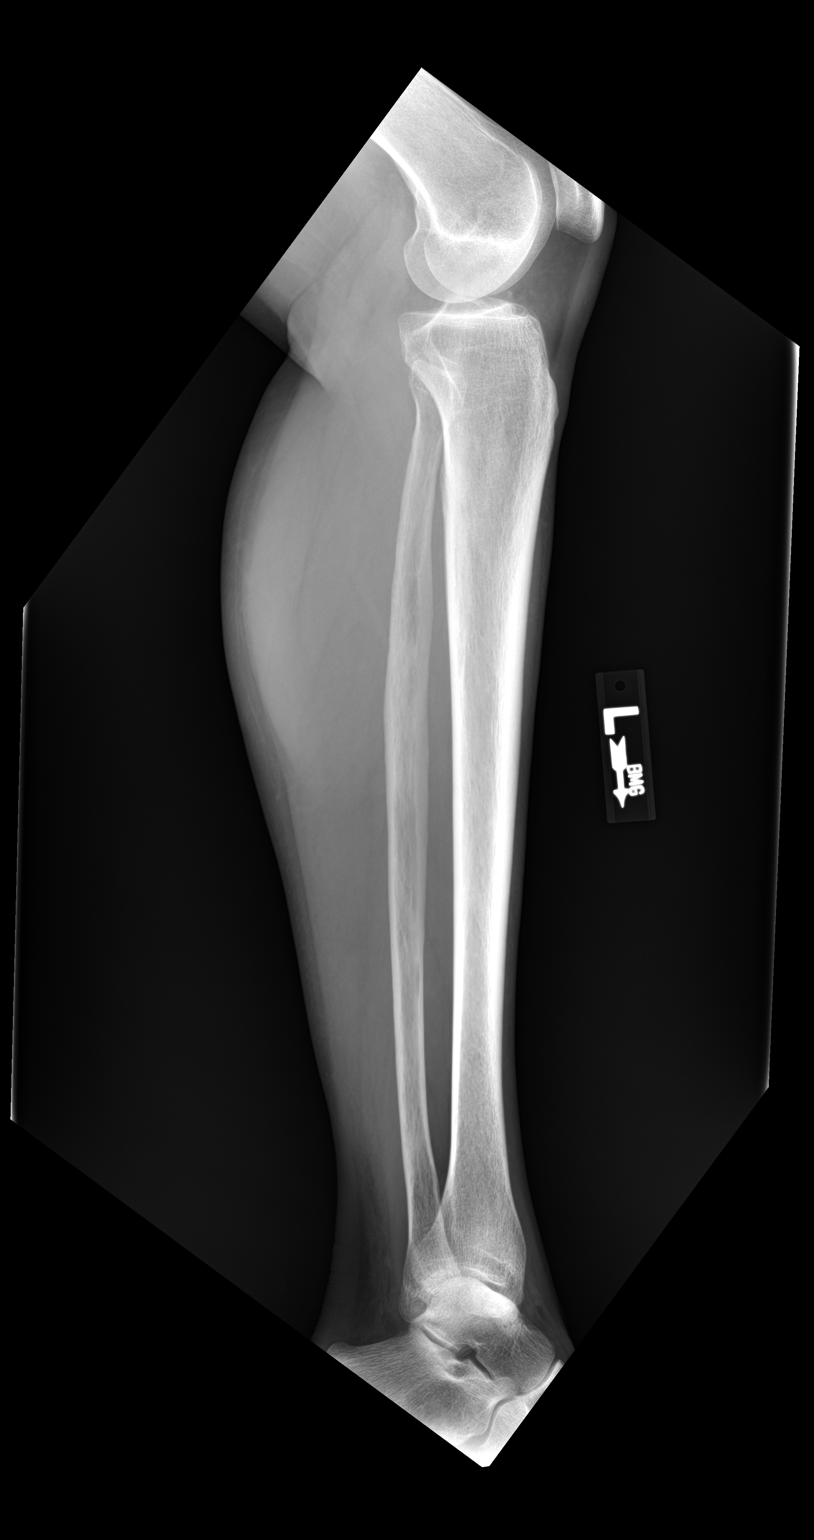

[3 of 3 positions shown; findings below may reference images not displayed]

FINDINGS: There is no evidence of fracture or other focal bone lesions. Soft
tissues are unremarkable.
IMPRESSION: Negative.

## 2018-07-13 NOTE — Telephone Encounter (Signed)
Attempted to call pt with no success left detailed message.

## 2018-07-21 ENCOUNTER — Telehealth: Payer: Self-pay | Admitting: Family Medicine

## 2018-07-21 NOTE — Telephone Encounter (Signed)
This is fine.  Pt never established/transferred care to this provider per [facility] note.

## 2018-07-21 NOTE — Telephone Encounter (Signed)
Patient is requesting to transfer from [facility] to [place].  Please advise.

## 2018-07-21 NOTE — Telephone Encounter (Signed)
Ok with me, but must also be ok with Dr Volanda Napoleon

## 2018-07-25 NOTE — Telephone Encounter (Signed)
Left pt vm to call back to schedule appt with Jenny Reichmann

## 2018-08-10 DIAGNOSIS — Z1231 Encounter for screening mammogram for malignant neoplasm of breast: Secondary | ICD-10-CM | POA: Diagnosis not present

## 2018-08-10 LAB — HM MAMMOGRAPHY

## 2018-08-11 ENCOUNTER — Ambulatory Visit (INDEPENDENT_AMBULATORY_CARE_PROVIDER_SITE_OTHER): Payer: BC Managed Care – PPO | Admitting: Internal Medicine

## 2018-08-11 ENCOUNTER — Encounter: Payer: Self-pay | Admitting: Internal Medicine

## 2018-08-11 ENCOUNTER — Other Ambulatory Visit (INDEPENDENT_AMBULATORY_CARE_PROVIDER_SITE_OTHER): Payer: BC Managed Care – PPO

## 2018-08-11 ENCOUNTER — Other Ambulatory Visit: Payer: Self-pay

## 2018-08-11 VITALS — BP 112/72 | HR 70 | Temp 98.3°F | Ht 66.0 in | Wt 136.0 lb

## 2018-08-11 DIAGNOSIS — E611 Iron deficiency: Secondary | ICD-10-CM

## 2018-08-11 DIAGNOSIS — Z1211 Encounter for screening for malignant neoplasm of colon: Secondary | ICD-10-CM | POA: Diagnosis not present

## 2018-08-11 DIAGNOSIS — Z0000 Encounter for general adult medical examination without abnormal findings: Secondary | ICD-10-CM

## 2018-08-11 DIAGNOSIS — Z114 Encounter for screening for human immunodeficiency virus [HIV]: Secondary | ICD-10-CM | POA: Diagnosis not present

## 2018-08-11 DIAGNOSIS — E538 Deficiency of other specified B group vitamins: Secondary | ICD-10-CM | POA: Diagnosis not present

## 2018-08-11 DIAGNOSIS — R739 Hyperglycemia, unspecified: Secondary | ICD-10-CM | POA: Diagnosis not present

## 2018-08-11 DIAGNOSIS — E559 Vitamin D deficiency, unspecified: Secondary | ICD-10-CM | POA: Diagnosis not present

## 2018-08-11 DIAGNOSIS — IMO0001 Reserved for inherently not codable concepts without codable children: Secondary | ICD-10-CM | POA: Insufficient documentation

## 2018-08-11 DIAGNOSIS — Z1159 Encounter for screening for other viral diseases: Secondary | ICD-10-CM | POA: Diagnosis not present

## 2018-08-11 DIAGNOSIS — Z531 Procedure and treatment not carried out because of patient's decision for reasons of belief and group pressure: Secondary | ICD-10-CM | POA: Insufficient documentation

## 2018-08-11 DIAGNOSIS — Z23 Encounter for immunization: Secondary | ICD-10-CM | POA: Diagnosis not present

## 2018-08-11 DIAGNOSIS — C649 Malignant neoplasm of unspecified kidney, except renal pelvis: Secondary | ICD-10-CM | POA: Insufficient documentation

## 2018-08-11 DIAGNOSIS — F411 Generalized anxiety disorder: Secondary | ICD-10-CM | POA: Diagnosis not present

## 2018-08-11 DIAGNOSIS — E785 Hyperlipidemia, unspecified: Secondary | ICD-10-CM | POA: Insufficient documentation

## 2018-08-11 LAB — IBC PANEL
Iron: 59 ug/dL (ref 42–145)
Saturation Ratios: 16 % — ABNORMAL LOW (ref 20.0–50.0)
Transferrin: 263 mg/dL (ref 212.0–360.0)

## 2018-08-11 LAB — LIPID PANEL
Cholesterol: 280 mg/dL — ABNORMAL HIGH (ref 0–200)
HDL: 94.8 mg/dL (ref >39.00)
LDL Cholesterol: 170 mg/dL — ABNORMAL HIGH (ref 0–99)
NonHDL: 185.55
Total CHOL/HDL Ratio: 3
Triglycerides: 76 mg/dL (ref 0.0–149.0)
VLDL: 15.2 mg/dL (ref 0.0–40.0)

## 2018-08-11 LAB — CBC WITH DIFFERENTIAL/PLATELET
Basophils Absolute: 0 10*3/uL (ref 0.0–0.1)
Basophils Relative: 0.3 % (ref 0.0–3.0)
Eosinophils Absolute: 0.3 10*3/uL (ref 0.0–0.7)
Eosinophils Relative: 3.4 % (ref 0.0–5.0)
HCT: 44.6 % (ref 36.0–46.0)
Hemoglobin: 14.9 g/dL (ref 12.0–15.0)
Lymphocytes Relative: 24.5 % (ref 12.0–46.0)
Lymphs Abs: 1.8 10*3/uL (ref 0.7–4.0)
MCHC: 33.3 g/dL (ref 30.0–36.0)
MCV: 92.6 fl (ref 78.0–100.0)
Monocytes Absolute: 0.6 10*3/uL (ref 0.1–1.0)
Monocytes Relative: 8 % (ref 3.0–12.0)
Neutro Abs: 4.8 10*3/uL (ref 1.4–7.7)
Neutrophils Relative %: 63.8 % (ref 43.0–77.0)
Platelets: 258 10*3/uL (ref 150.0–400.0)
RBC: 4.81 Mil/uL (ref 3.87–5.11)
RDW: 13.2 % (ref 11.5–15.5)
WBC: 7.5 10*3/uL (ref 4.0–10.5)

## 2018-08-11 LAB — BASIC METABOLIC PANEL
BUN: 16 mg/dL (ref 6–23)
CO2: 30 mEq/L (ref 19–32)
Calcium: 9.8 mg/dL (ref 8.4–10.5)
Chloride: 102 mEq/L (ref 96–112)
Creatinine, Ser: 0.87 mg/dL (ref 0.40–1.20)
GFR: 65.95 mL/min (ref >60.00)
Glucose, Bld: 99 mg/dL (ref 70–99)
Potassium: 4.4 mEq/L (ref 3.5–5.1)
Sodium: 141 mEq/L (ref 135–145)

## 2018-08-11 LAB — HEPATIC FUNCTION PANEL
ALT: 18 U/L (ref 0–35)
AST: 16 U/L (ref 0–37)
Albumin: 4.8 g/dL (ref 3.5–5.2)
Alkaline Phosphatase: 61 U/L (ref 39–117)
Bilirubin, Direct: 0 mg/dL (ref 0.0–0.3)
Total Bilirubin: 0.4 mg/dL (ref 0.2–1.2)
Total Protein: 7.5 g/dL (ref 6.0–8.3)

## 2018-08-11 LAB — TSH: TSH: 1.55 u[IU]/mL (ref 0.35–4.50)

## 2018-08-11 LAB — VITAMIN B12: Vitamin B-12: 1027 pg/mL — ABNORMAL HIGH (ref 211–911)

## 2018-08-11 LAB — HEMOGLOBIN A1C: Hgb A1c MFr Bld: 5.8 % (ref 4.6–6.5)

## 2018-08-11 LAB — VITAMIN D 25 HYDROXY (VIT D DEFICIENCY, FRACTURES): VITD: 44.21 ng/mL (ref 30.00–100.00)

## 2018-08-11 MED ORDER — BUPROPION HCL ER (XL) 300 MG PO TB24: 300.0000 mg | ORAL_TABLET | Freq: Every day | ORAL | 4 refills | Status: DC

## 2018-08-11 MED ORDER — METOPROLOL SUCCINATE ER 25 MG PO TB24: 12.5000 mg | ORAL_TABLET | Freq: Two times a day (BID) | ORAL | 3 refills | Status: DC

## 2018-08-11 NOTE — Progress Notes (Signed)
Subjective:    Patient ID: Shelby Grant, female    DOB: [birth year], 63 y.o.   MRN: [id number]  HPI  Here for wellness and f/u;  Overall doing ok;  Pt denies Chest pain, worsening SOB, DOE, wheezing, orthopnea, PND, worsening LE edema, palpitations, dizziness or syncope.  Pt denies neurological change such as new headache, facial or extremity weakness.  Pt denies polydipsia, polyuria, or low sugar symptoms. Pt states overall good compliance with treatment and medications, good tolerability, and has been trying to follow appropriate diet.  Pt denies worsening depressive symptoms, suicidal ideation or panic. No fever, night sweats, wt loss, loss of appetite, or other constitutional symptoms.  Pt states good ability with ADL's, has low fall risk, home safety reviewed and adequate, no other significant changes in hearing or vision, and only occasionally active with exercise.   s/p right hip THR in [place] oct 2019.  No new complaints Past Medical History:  Diagnosis Date  . ALLERGIC RHINITIS 07/03/2008  . ANXIETY 06/02/2007  . Arthritis   . BELL'S PALSY, LEFT 12/02/2008  . Cancer ([place])   . CELLULITIS, LEFT LEG 09/14/2008  . CHEST PAIN 07/07/2009  . Complication of anesthesia 08-28-2014   "work up in recovery room with jaw locked open"  . CORNS AND CALLUSES 07/03/2008  . Depression   . FOOT PAIN 09/22/2009  . History of kidney cancer   . IRREGULAR MENSES 06/03/2008  . Neoplasm    left renal  . OCD (obsessive compulsive disorder)   . Renal disorder   . Stress fracture of ankle since Jun 29, 2014   left  . Supraventricular tachycardia ([place]) 06/02/2007   Atrial tachycardia with Wenckebach periodicity   Past Surgical History:  Procedure Laterality Date  . APPENDECTOMY    . LAPAROSCOPIC APPENDECTOMY N/A 08/28/2014   Procedure: APPENDECTOMY LAPAROSCOPIC;  Surgeon: Judeth Horn, MD;  Location: [facility];  Service: General;  Laterality: N/A;  . ROBOTIC ASSITED PARTIAL NEPHRECTOMY Left 01/06/2015   Procedure:  ROBOTIC ASSISTED PARTIAL NEPHRECTOMY;  Surgeon: Raynelle Bring, MD;  Location: [facility];  Service: Urology;  Laterality: Left;  . WISDOM TOOTH EXTRACTION      reports that she has never smoked. She has never used smokeless tobacco. She reports current alcohol use. She reports that she does not use drugs. family history includes Asthma in her sister; Cancer in her mother. Allergies  Allergen Reactions  . Latex     REACTION: rash  . Amoxicillin Rash    Has patient had a PCN reaction causing immediate rash, facial/tongue/throat swelling, SOB or lightheadedness with hypotension: unkown Has patient had a PCN reaction causing severe rash involving mucus membranes or skin necrosis: no Has patient had a PCN reaction that required hospitalization No Has patient had a PCN reaction occurring within the last 10 years: unknown If all of the above answers are "NO", then may proceed with Cephalosporin use.   Current Outpatient Medications on File Prior to Visit  Medication Sig Dispense Refill  . Acetaminophen (TYLENOL PO) Take 1,000 mg by mouth daily.    Marland Kitchen ALFALFA PO Take by mouth.    Marland Kitchen aspirin 81 MG tablet Take 81 mg by mouth daily.    Marland Kitchen b complex vitamins tablet Take 1 tablet by mouth daily.    . Calcium-Magnesium-Vitamin D (CALCIUM MAGNESIUM PO) Take 4 tablets by mouth daily.     . clonazePAM (KLONOPIN) 1 MG tablet Take 1 tablet (1 mg total) by mouth daily. 90 tablet 1  .  Multiple Vitamin (MULTIVITAMIN) capsule Take 1 capsule by mouth daily.    . Probiotic Product (ALIGN) 4 MG CAPS Take 1 capsule by mouth daily.     . Pyridoxine HCl (VITAMIN B-6 PO) Take 1 tablet by mouth daily.    . TURMERIC PO Take 3 tablets by mouth daily.     . [DISCONTINUED] estrogen, conjugated,-medroxyprogesterone (PREMPRO) 0.625-2.5 MG per tablet Take 1 tablet by mouth daily. 28 tablet 11   No current facility-administered medications on file prior to visit.    Review of Systems Constitutional: Negative for other unusual  diaphoresis, sweats, appetite or weight changes HENT: Negative for other worsening hearing loss, ear pain, facial swelling, mouth sores or neck stiffness.   Eyes: Negative for other worsening pain, redness or other visual disturbance.  Respiratory: Negative for other stridor or swelling Cardiovascular: Negative for other palpitations or other chest pain  Gastrointestinal: Negative for worsening diarrhea or loose stools, blood in stool, distention or other pain Genitourinary: Negative for hematuria, flank pain or other change in urine volume.  Musculoskeletal: Negative for myalgias or other joint swelling.  Skin: Negative for other color change, or other wound or worsening drainage.  Neurological: Negative for other syncope or numbness. Hematological: Negative for other adenopathy or swelling Psychiatric/Behavioral: Negative for hallucinations, other worsening agitation, SI, self-injury, or new decreased concentration All other system neg per pt    Objective:   Physical Exam BP 112/72   Pulse 70   Temp 98.3 F (36.8 C) (Oral)   Ht 5\' 6"  (1.676 m)   Wt 136 lb (61.7 kg)   LMP 08/01/2012   SpO2 97%   BMI 21.95 kg/m  VS noted,  Constitutional: Pt is oriented to person, place, and time. Appears well-developed and well-nourished, in no significant distress and comfortable Head: Normocephalic and atraumatic  Eyes: Conjunctivae and EOM are normal. Pupils are equal, round, and reactive to light Right Ear: External ear normal without discharge Left Ear: External ear normal without discharge Nose: Nose without discharge or deformity Mouth/Throat: Oropharynx is without other ulcerations and moist  Neck: Normal range of motion. Neck supple. No JVD present. No tracheal deviation present or significant neck LA or mass Cardiovascular: Normal rate, regular rhythm, normal heart sounds and intact distal pulses.   Pulmonary/Chest: WOB normal and breath sounds without rales or wheezing  Abdominal:  Soft. Bowel sounds are normal. NT. No HSM  Musculoskeletal: Normal range of motion. Exhibits no edema Lymphadenopathy: Has no other cervical adenopathy.  Neurological: Pt is alert and oriented to person, place, and time. Pt has normal reflexes. No cranial nerve deficit. Motor grossly intact, Gait intact Skin: Skin is warm and dry. No rash noted or new ulcerations Psychiatric:  Has normal mood and affect. Behavior is normal without agitation No other exam findings Lab Results  Component Value Date   WBC 7.5 08/11/2018   HGB 14.9 08/11/2018   HCT 44.6 08/11/2018   PLT 258.0 08/11/2018   GLUCOSE 99 08/11/2018   CHOL 280 (H) 08/11/2018   TRIG 76.0 08/11/2018   HDL 94.80 08/11/2018   LDLDIRECT 137.5 10/06/2012   LDLCALC 170 (H) 08/11/2018   ALT 18 08/11/2018   AST 16 08/11/2018   NA 141 08/11/2018   K 4.4 08/11/2018   CL 102 08/11/2018   CREATININE 0.87 08/11/2018   BUN 16 08/11/2018   CO2 30 08/11/2018   TSH 1.55 08/11/2018   HGBA1C 5.8 08/11/2018      Assessment & Plan:

## 2018-08-11 NOTE — Patient Instructions (Addendum)
You had the Tdap tetanus shot today  You will be contacted regarding the referral for: colonoscopy  Please continue all other medications as before, and refills have been done if requested.  Please have the pharmacy call with any other refills you may need.  Please continue your efforts at being more active, low cholesterol diet, and weight control.  You are otherwise up to date with prevention measures today.  Please keep your appointments with your specialists as you may have planned  Please go to the LAB in the Basement (turn left off the elevator) for the tests to be done today  You will be contacted by phone if any changes need to be made immediately.  Otherwise, you will receive a letter about your results with an explanation, but please check with MyChart first.  Please remember to sign up for MyChart if you have not done so, as this will be important to you in the future with finding out test results, communicating by private email, and scheduling acute appointments online when needed.  Please return in 1 year for your yearly visit, or sooner if needed, with Lab testing done 3-5 days before

## 2018-08-12 ENCOUNTER — Other Ambulatory Visit: Payer: Self-pay | Admitting: Internal Medicine

## 2018-08-12 ENCOUNTER — Encounter: Payer: Self-pay | Admitting: Internal Medicine

## 2018-08-12 MED ORDER — ROSUVASTATIN CALCIUM 20 MG PO TABS: 20.0000 mg | ORAL_TABLET | Freq: Every day | ORAL | 3 refills | Status: DC

## 2018-08-12 NOTE — Assessment & Plan Note (Signed)
stable overall by history and exam, recent data reviewed with pt, and pt to continue medical treatment as before,  to f/u any worsening symptoms or concerns  

## 2018-08-12 NOTE — Assessment & Plan Note (Signed)
For colonoscopy 

## 2018-08-12 NOTE — Assessment & Plan Note (Signed)

## 2018-08-12 NOTE — Assessment & Plan Note (Signed)
stable overall by history and exam, recent data reviewed with pt, and pt to continue medical treatment as before,  to f/u any worsening symptoms or concerns, for a1c 

## 2018-08-15 ENCOUNTER — Telehealth: Payer: Self-pay

## 2018-08-15 LAB — HEPATITIS C ANTIBODY
Hepatitis C Ab: NONREACTIVE
SIGNAL TO CUT-OFF: 0.01 (ref <1.00)

## 2018-08-15 LAB — HIV ANTIBODY (ROUTINE TESTING W REFLEX): HIV 1&2 Ab, 4th Generation: NONREACTIVE

## 2018-08-15 NOTE — Telephone Encounter (Signed)
Pt has been informed of results and expressed understanding.  °

## 2018-08-15 NOTE — Telephone Encounter (Signed)
-----   Message from Biagio Borg, MD sent at 08/12/2018 10:45 AM EDT ----- Left message on MyChart, pt to cont same tx except  The test results show that your current treatment is OK, except the LDL cholesterol is very high, since the goal is the LDL < 100.  Please follow a lower cholesterol diet, but also start crestor 20 mg per day, as this will help reduce your future heart and stroke risk.    Lizzie An to please inform pt, I will do rx

## 2018-09-12 DIAGNOSIS — S92511A Displaced fracture of proximal phalanx of right lesser toe(s), initial encounter for closed fracture: Secondary | ICD-10-CM | POA: Diagnosis not present

## 2018-10-03 ENCOUNTER — Encounter: Payer: Self-pay | Admitting: Internal Medicine

## 2018-10-11 DIAGNOSIS — R201 Hypoesthesia of skin: Secondary | ICD-10-CM | POA: Diagnosis not present

## 2018-11-06 ENCOUNTER — Telehealth: Payer: Self-pay | Admitting: Internal Medicine

## 2018-11-06 MED ORDER — CLONAZEPAM 1 MG PO TABS: 1.0000 mg | ORAL_TABLET | Freq: Every day | ORAL | 0 refills | Status: DC

## 2018-11-06 NOTE — Telephone Encounter (Signed)
Done erx  Please let pt know refill is one month only, please make ROV for further refills

## 2018-11-06 NOTE — Telephone Encounter (Signed)
Copied from [place] [phone]. Topic: Quick Communication - Rx Refill/Question >> Nov 06, 2018  1:46 PM Rainey Pines A wrote: Medication: clonazePAM (KLONOPIN) 1 MG tablet   Has the patient contacted their pharmacy? Yes (Agent: If no, request that the patient contact the pharmacy for the refill.) (Agent: If yes, when and what did the pharmacy advise?)Contact PCP  Preferred Pharmacy (with phone number or street name): [facility] [phone] - [place], [place] - [place] [phone] (Phone) [phone] (Fax)    Agent: Please be advised that RX refills may take up to 3 business days. We ask that you follow-up with your pharmacy.

## 2018-11-16 DIAGNOSIS — Z96641 Presence of right artificial hip joint: Secondary | ICD-10-CM | POA: Diagnosis not present

## 2018-11-20 DIAGNOSIS — S93602A Unspecified sprain of left foot, initial encounter: Secondary | ICD-10-CM | POA: Diagnosis not present

## 2018-11-27 ENCOUNTER — Encounter: Payer: Self-pay | Admitting: Internal Medicine

## 2018-11-28 ENCOUNTER — Encounter: Payer: Self-pay | Admitting: Internal Medicine

## 2018-12-04 ENCOUNTER — Ambulatory Visit: Payer: Self-pay

## 2018-12-04 ENCOUNTER — Encounter: Payer: Self-pay | Admitting: Family Medicine

## 2018-12-04 ENCOUNTER — Other Ambulatory Visit: Payer: Self-pay

## 2018-12-04 ENCOUNTER — Ambulatory Visit (INDEPENDENT_AMBULATORY_CARE_PROVIDER_SITE_OTHER): Payer: BC Managed Care – PPO | Admitting: Family Medicine

## 2018-12-04 ENCOUNTER — Telehealth: Payer: Self-pay

## 2018-12-04 VITALS — BP 112/66 | HR 68 | Ht 66.0 in | Wt 139.0 lb

## 2018-12-04 DIAGNOSIS — M79671 Pain in right foot: Secondary | ICD-10-CM

## 2018-12-04 DIAGNOSIS — M79672 Pain in left foot: Secondary | ICD-10-CM

## 2018-12-04 DIAGNOSIS — M216X9 Other acquired deformities of unspecified foot: Secondary | ICD-10-CM | POA: Insufficient documentation

## 2018-12-04 MED ORDER — VITAMIN D (ERGOCALCIFEROL) 1.25 MG (50000 UNIT) PO CAPS: 50000.0000 [IU] | ORAL_CAPSULE | ORAL | 0 refills | Status: DC

## 2018-12-04 NOTE — Progress Notes (Signed)
[facility] [address], [place] [address] Phone: [phone] Subjective:   Shelby Grant, am serving as a scribe for Dr. Hulan Saas.    CC: Bilateral foot pain  [id number]  Shelby Grant is a 63 y.o. female coming in with complaint of bilateral foot pain. Patient hit 5th toe in August on door frame. Was told it was fractured. Patient is having achy pain over the lateral side of right foot. Also complains of numbness and tingling in her toes of right foot. Two weeks later patient slipped in shower and hit medial aspect of left foot on shower threshold.Pateint continues to have soreness in the superior aspect of tarsal bones. Pressure irritates that area.   Patient also mentions clicking in right knee but it occurs without pain.      Past Medical History:  Diagnosis Date  . ALLERGIC RHINITIS 07/03/2008  . ANXIETY 06/02/2007  . Arthritis   . BELL'S PALSY, LEFT 12/02/2008  . Cancer ([place])   . CELLULITIS, LEFT LEG 09/14/2008  . CHEST PAIN 07/07/2009  . Complication of anesthesia 08-28-2014   "work up in recovery room with jaw locked open"  . CORNS AND CALLUSES 07/03/2008  . Depression   . FOOT PAIN 09/22/2009  . History of kidney cancer   . IRREGULAR MENSES 06/03/2008  . Neoplasm    left renal  . OCD (obsessive compulsive disorder)   . Renal disorder   . Stress fracture of ankle since Jun 29, 2014   left  . Supraventricular tachycardia ([place]) 06/02/2007   Atrial tachycardia with Wenckebach periodicity   Past Surgical History:  Procedure Laterality Date  . APPENDECTOMY    . LAPAROSCOPIC APPENDECTOMY N/A 08/28/2014   Procedure: APPENDECTOMY LAPAROSCOPIC;  Surgeon: Judeth Horn, MD;  Location: [facility];  Service: General;  Laterality: N/A;  . ROBOTIC ASSITED PARTIAL NEPHRECTOMY Left 01/06/2015   Procedure: ROBOTIC ASSISTED PARTIAL NEPHRECTOMY;  Surgeon: Raynelle Bring, MD;  [facility];  Service: Urology;  Laterality: Left;  . WISDOM TOOTH  EXTRACTION     Social History   Socioeconomic History  . Marital status: Married    Spouse name: Not on file  . Number of children: Not on file  . Years of education: Not on file  . Highest education level: Not on file  Occupational History  . Occupation: Building services engineer: [employer]  . Financial resource strain: Not on file  . Food insecurity    Worry: Not on file    Inability: Not on file  . Transportation needs    Medical: Not on file    Non-medical: Not on file  Tobacco Use  . Smoking status: Never Smoker  . Smokeless tobacco: Never Used  Substance and Sexual Activity  . Alcohol use: Yes    Comment: social  . Drug use: Grant  . Sexual activity: Yes    Comment: married  Lifestyle  . Physical activity    Days per week: Not on file    Minutes per session: Not on file  . Stress: Not on file  Relationships  . Social Herbalist on phone: Not on file    Gets together: Not on file    Attends religious service: Not on file    Active member of club or organization: Not on file    Attends meetings of clubs or organizations: Not on file    Relationship status: Not  on file  Other Topics Concern  . Not on file  Social History Narrative   Grant caffeine    Allergies  Allergen Reactions  . Latex     REACTION: rash  . Amoxicillin Rash    Has patient had a PCN reaction causing immediate rash, facial/tongue/throat swelling, SOB or lightheadedness with hypotension: unkown Has patient had a PCN reaction causing severe rash involving mucus membranes or skin necrosis: Grant Has patient had a PCN reaction that required hospitalization Grant Has patient had a PCN reaction occurring within the last 10 years: unknown If all of the above answers are "Grant", then may proceed with Cephalosporin use.   Family History  Problem Relation Age of Onset  . Asthma Sister   . Cancer Mother        Marena Chancy where cancer started      Current Outpatient  Medications (Cardiovascular):  .  metoprolol succinate (TOPROL-XL) 25 MG 24 hr tablet, Take 0.5 tablets (12.5 mg total) by mouth 2 (two) times daily. .  rosuvastatin (CRESTOR) 20 MG tablet, Take 1 tablet (20 mg total) by mouth daily.   Current Outpatient Medications (Analgesics):  Marland Kitchen  Acetaminophen (TYLENOL PO), Take 1,000 mg by mouth daily. Marland Kitchen  aspirin 81 MG tablet, Take 81 mg by mouth daily.   Current Outpatient Medications (Other):  Marland Kitchen  ALFALFA PO, Take by mouth. Marland Kitchen  b complex vitamins tablet, Take 1 tablet by mouth daily. Marland Kitchen  buPROPion (WELLBUTRIN XL) 300 MG 24 hr tablet, Take 1 tablet (300 mg total) by mouth daily. .  Calcium-Magnesium-Vitamin D (CALCIUM MAGNESIUM PO), Take 4 tablets by mouth daily.  .  clonazePAM (KLONOPIN) 1 MG tablet, Take 1 tablet (1 mg total) by mouth daily. .  Multiple Vitamin (MULTIVITAMIN) capsule, Take 1 capsule by mouth daily. .  Probiotic Product (ALIGN) 4 MG CAPS, Take 1 capsule by mouth daily.  .  Pyridoxine HCl (VITAMIN B-6 PO), Take 1 tablet by mouth daily. .  TURMERIC PO, Take 3 tablets by mouth daily.  .  Vitamin D, Ergocalciferol, (DRISDOL) 1.25 MG (50000 UT) CAPS capsule, Take 1 capsule (50,000 Units total) by mouth every 7 (seven) days.    Past medical history, social, surgical and family history all reviewed in electronic medical record.  Grant pertanent information unless stated regarding to the chief complaint.   Review of Systems:  Grant headache, visual changes, nausea, vomiting, diarrhea, constipation, dizziness, abdominal pain, skin rash, fevers, chills, night sweats, weight loss, swollen lymph nodes, body aches, joint swelling, muscle aches, chest pain, shortness of breath, mood changes.   Objective  Blood pressure 112/66, pulse 68, height 5\' 6"  (1.676 m), weight 139 lb (63 kg), last menstrual period 08/01/2012, SpO2 97 %.   General: Grant apparent distress alert and oriented x3 mood and affect normal, dressed appropriately.  HEENT: Pupils equal,  extraocular movements intact  Respiratory: Patient's speak in full sentences and does not appear short of breath  Cardiovascular: Grant lower extremity edema, non tender, Grant erythema  Skin: Warm dry intact with Grant signs of infection or rash on extremities or on axial skeleton.  Abdomen: Soft nontender  Neuro: Cranial nerves II through XII are intact, neurovascularly intact in all extremities with 2+ DTRs and 2+ pulses.  Lymph: Grant lymphadenopathy of posterior or anterior cervical chain or axillae bilaterally.  Gait normal with good balance and coordination.  MSK:  Non tender with full range of motion and good stability and symmetric strength and tone of shoulders, elbows, wrist, hip,bilaterally.  Bilateral knee shows some mild lateral tracking of the kneecap bilaterally right greater than left with positive grind test.  Grant instability of the knee noted.  Ankles fairly unremarkable with full range of motion.  Patient will midfoot does have significant rigidity noted.  Patient does have some severe breakdown of the transverse arch bilaterally.  Limited musculoskeletal ultrasound was performed and interpreted by Lyndal Pulley  Limited ultrasound of patient's foot bilaterally shows moderate arthritic changes of the midfoot.  Mild neuroma noted between the second and third and third and fourth intermetatarsal areas on the right side. Impression: Moderate foot arthritis and breakdown the transverse arch   Impression and Recommendations:     This case required medical decision making of moderate complexity. The above documentation has been reviewed and is accurate and complete Lyndal Pulley, DO       Note: This dictation was prepared with Dragon dictation along with smaller phrase technology. Any transcriptional errors that result from this process are unintentional.

## 2018-12-04 NOTE — Patient Instructions (Addendum)
Good to see you.  Ice 20 minutes 2 times daily. Usually after activity and before bed. Exercises 3 times a week.  [employer] Total Support Orthotics HOKA Recovery Sandals Once weekly vitamin D for 12 weeks.   Do not pick up anything underhand See me again in 5-6 weeks

## 2018-12-04 NOTE — Telephone Encounter (Signed)
Confirmed that the vitamin d she will be taking is once weekly.

## 2018-12-04 NOTE — Assessment & Plan Note (Signed)
Breakdown of the arches bilaterally.  Discussed over-the-counter orthotics, home exercise, icing regimen, which activities to do which wants to avoid.  Patient is to increase activity slowly.  Avoid being barefoot.  Worsening symptoms may be needing laboratory work-up and custom orthotics.  Follow-up again in 4 to 8 weeks

## 2018-12-06 ENCOUNTER — Encounter: Payer: Self-pay | Admitting: Family Medicine

## 2018-12-07 ENCOUNTER — Telehealth: Payer: Self-pay | Admitting: *Deleted

## 2018-12-07 NOTE — Telephone Encounter (Signed)
Pt called stating she had some more questions for Dr. Tamala Julian from the [facility] this week. She stated Dr. Tamala Julian told her to get a certain kind of shoes & does he want her to get a shoe with a "toe rocker"? She also asked if she should continue taking her calcium/magnesium supplement along with her once a week vitamin D? Pt also asked if Dr. Tamala Julian can explain to her what exactly is wrong with her feet and why he is suggesting these things to help her?

## 2018-12-08 NOTE — Telephone Encounter (Signed)
Left message for patient to call back to address questions.

## 2018-12-11 NOTE — Telephone Encounter (Signed)
Spoke with patient about proper shoes and icing regimen as well as per a verbal from Dr. Tamala Julian to continue taking calcium and magnesium supplements. Patient voices understanding.

## 2018-12-22 ENCOUNTER — Ambulatory Visit: Payer: BC Managed Care – PPO | Admitting: Family Medicine

## 2018-12-27 ENCOUNTER — Encounter: Payer: Self-pay | Admitting: Gastroenterology

## 2019-01-09 ENCOUNTER — Ambulatory Visit (INDEPENDENT_AMBULATORY_CARE_PROVIDER_SITE_OTHER): Payer: BC Managed Care – PPO | Admitting: Family Medicine

## 2019-01-09 ENCOUNTER — Other Ambulatory Visit: Payer: Self-pay

## 2019-01-09 ENCOUNTER — Encounter: Payer: Self-pay | Admitting: Family Medicine

## 2019-01-09 DIAGNOSIS — M216X9 Other acquired deformities of unspecified foot: Secondary | ICD-10-CM | POA: Diagnosis not present

## 2019-01-09 NOTE — Patient Instructions (Addendum)
  [address]st [address] [place], [place] [address] Phone [phone]  Keep doing everything you are doing Send me a MyChart message after 1st of year with update See me in 2 months otherwise

## 2019-01-09 NOTE — Assessment & Plan Note (Signed)
Patient has underlying anxiety disorder that likely contributes to some of the difficulty.  Patient is some breakdown of the transverse arch and I do think is contributing to some of the pain as well.  Discussed icing regimen and home exercises and encouraged her to continue to wear good shoes.  Patient will follow up with me again 6 to 8 weeks.  Spent  25 minutes with patient face-to-face and had greater than 50% of counseling including as described above in assessment and plan.

## 2019-01-09 NOTE — Progress Notes (Signed)
[facility] [address], [place] [address] Phone: [phone] Subjective:   Shelby Grant, am serving as a scribe for Dr. Hulan Saas. This visit occurred during the SARS-CoV-2 public health emergency.  Safety protocols were in place, including screening questions prior to the visit, additional usage of staff PPE, and extensive cleaning of exam room while observing appropriate contact time as indicated for disinfecting solutions.      CC: Foot pain follow-up  [id number]   12/04/2018 Breakdown of the arches bilaterally.  Discussed over-the-counter orthotics, home exercise, icing regimen, which activities to do which wants to avoid.  Patient is to increase activity slowly.  Avoid being barefoot.  Worsening symptoms may be needing laboratory work-up and custom orthotics.  Follow-up again in 4 to 8 weeks  Update 01/09/2019 Shelby Grant is a 63 y.o. female coming in with complaint of bilateral foot pain. Patient states that she is felt she has had slight improvement. Soreness in left foot over superior tarsal bones. On the right foot she continues to have pain over the 5th metatarsal.  Is using the Arahi shoe with the orthotics.  Patient has also tried sandals for indoors.  Would state that overall she is making progress.  Patient states that more of a soreness.    Past Medical History:  Diagnosis Date  . ALLERGIC RHINITIS 07/03/2008  . ANXIETY 06/02/2007  . Arthritis   . BELL'S PALSY, LEFT 12/02/2008  . Cancer ([place])   . CELLULITIS, LEFT LEG 09/14/2008  . CHEST PAIN 07/07/2009  . Complication of anesthesia 08-28-2014   "work up in recovery room with jaw locked open"  . CORNS AND CALLUSES 07/03/2008  . Depression   . FOOT PAIN 09/22/2009  . History of kidney cancer   . IRREGULAR MENSES 06/03/2008  . Neoplasm    left renal  . OCD (obsessive compulsive disorder)   . Renal disorder   . Stress fracture of ankle since Jun 29, 2014   left  .  Supraventricular tachycardia ([place]) 06/02/2007   Atrial tachycardia with Wenckebach periodicity   Past Surgical History:  Procedure Laterality Date  . APPENDECTOMY    . LAPAROSCOPIC APPENDECTOMY N/A 08/28/2014   Procedure: APPENDECTOMY LAPAROSCOPIC;  Surgeon: Judeth Horn, MD;  Location: [facility];  Service: General;  Laterality: N/A;  . ROBOTIC ASSITED PARTIAL NEPHRECTOMY Left 01/06/2015   Procedure: ROBOTIC ASSISTED PARTIAL NEPHRECTOMY;  Surgeon: Raynelle Bring, MD;  Location: [facility];  Service: Urology;  Laterality: Left;  . WISDOM TOOTH EXTRACTION     Social History   Socioeconomic History  . Marital status: Married    Spouse name: Not on file  . Number of children: Not on file  . Years of education: Not on file  . Highest education level: Not on file  Occupational History  . Occupation: Building services engineer: [employer]  . Financial resource strain: Not on file  . Food insecurity    Worry: Not on file    Inability: Not on file  . Transportation needs    Medical: Not on file    Non-medical: Not on file  Tobacco Use  . Smoking status: Never Smoker  . Smokeless tobacco: Never Used  Substance and Sexual Activity  . Alcohol use: Yes    Comment: social  . Drug use: Grant  . Sexual activity: Yes    Comment: married  Lifestyle  . Physical activity    Days per  week: Not on file    Minutes per session: Not on file  . Stress: Not on file  Relationships  . Social Herbalist on phone: Not on file    Gets together: Not on file    Attends religious service: Not on file    Active member of club or organization: Not on file    Attends meetings of clubs or organizations: Not on file    Relationship status: Not on file  Other Topics Concern  . Not on file  Social History Narrative   Grant caffeine    Allergies  Allergen Reactions  . Latex     REACTION: rash  . Amoxicillin Rash    Has patient had a PCN reaction causing immediate rash,  facial/tongue/throat swelling, SOB or lightheadedness with hypotension: unkown Has patient had a PCN reaction causing severe rash involving mucus membranes or skin necrosis: Grant Has patient had a PCN reaction that required hospitalization Grant Has patient had a PCN reaction occurring within the last 10 years: unknown If all of the above answers are "Grant", then may proceed with Cephalosporin use.   Family History  Problem Relation Age of Onset  . Asthma Sister   . Cancer Mother        Marena Chancy where cancer started      Current Outpatient Medications (Cardiovascular):  .  metoprolol succinate (TOPROL-XL) 25 MG 24 hr tablet, Take 0.5 tablets (12.5 mg total) by mouth 2 (two) times daily. .  rosuvastatin (CRESTOR) 20 MG tablet, Take 1 tablet (20 mg total) by mouth daily.   Current Outpatient Medications (Analgesics):  Marland Kitchen  Acetaminophen (TYLENOL PO), Take 1,000 mg by mouth daily. Marland Kitchen  aspirin 81 MG tablet, Take 81 mg by mouth daily.   Current Outpatient Medications (Other):  Marland Kitchen  ALFALFA PO, Take by mouth. Marland Kitchen  b complex vitamins tablet, Take 1 tablet by mouth daily. Marland Kitchen  buPROPion (WELLBUTRIN XL) 300 MG 24 hr tablet, Take 1 tablet (300 mg total) by mouth daily. .  Calcium-Magnesium-Vitamin D (CALCIUM MAGNESIUM PO), Take 4 tablets by mouth daily.  .  clonazePAM (KLONOPIN) 1 MG tablet, Take 1 tablet (1 mg total) by mouth daily. .  Multiple Vitamin (MULTIVITAMIN) capsule, Take 1 capsule by mouth daily. .  Probiotic Product (ALIGN) 4 MG CAPS, Take 1 capsule by mouth daily.  .  Pyridoxine HCl (VITAMIN B-6 PO), Take 1 tablet by mouth daily. .  TURMERIC PO, Take 3 tablets by mouth daily.  .  Vitamin D, Ergocalciferol, (DRISDOL) 1.25 MG (50000 UT) CAPS capsule, Take 1 capsule (50,000 Units total) by mouth every 7 (seven) days.    Past medical history, social, surgical and family history all reviewed in electronic medical record.  Grant pertanent information unless stated regarding to the chief complaint.    Review of Systems:  Grant headache, visual changes, nausea, vomiting, diarrhea, constipation, dizziness, abdominal pain, skin rash, fevers, chills, night sweats, weight loss, swollen lymph nodes, body aches, joint swelling,chest pain, shortness of breath, mood changes.  Positive muscle aches Objective  Blood pressure 120/82, pulse 84, height 5\' 6"  (1.676 m), weight 140 lb (63.5 kg), last menstrual period 08/01/2012, SpO2 96 %.    General: Grant apparent distress alert and oriented x3 mood and affect normal, dressed appropriately.  HEENT: Pupils equal, extraocular movements intact  Respiratory: Patient's speak in full sentences and does not appear short of breath  Cardiovascular: Grant lower extremity edema, non tender, Grant erythema  Skin: Warm dry intact  with Grant signs of infection or rash on extremities or on axial skeleton.  Abdomen: Soft nontender  Neuro: Cranial nerves II through XII are intact, neurovascularly intact in all extremities with 2+ DTRs and 2+ pulses.  Lymph: Grant lymphadenopathy of posterior or anterior cervical chain or axillae bilaterally.  Gait normal with good balance and coordination.  MSK:  tender with full range of motion and good stability and symmetric strength and tone of shoulders, elbows, wrist, hip, knee and ankles bilaterally. Foot exam does show the patient does have a rigid midfoot.  High arches noted.  Patient does have overpronation of the right posterior ankle.  Neurovascular intact distally.    Impression and Recommendations:     This case required medical decision making of moderate complexity. The above documentation has been reviewed and is accurate and complete Lyndal Pulley, DO       Note: This dictation was prepared with Dragon dictation along with smaller phrase technology. Any transcriptional errors that result from this process are unintentional.

## 2019-01-22 ENCOUNTER — Encounter: Payer: BC Managed Care – PPO | Admitting: Gastroenterology

## 2019-02-19 ENCOUNTER — Other Ambulatory Visit: Payer: Self-pay | Admitting: Internal Medicine

## 2019-02-19 NOTE — Telephone Encounter (Signed)
Done erx 

## 2019-03-02 ENCOUNTER — Encounter: Payer: Self-pay | Admitting: Family Medicine

## 2019-03-13 ENCOUNTER — Ambulatory Visit: Payer: BC Managed Care – PPO | Admitting: Family Medicine

## 2019-03-15 ENCOUNTER — Other Ambulatory Visit: Payer: Self-pay

## 2019-03-15 ENCOUNTER — Ambulatory Visit (HOSPITAL_COMMUNITY)
Admission: RE | Admit: 2019-03-15 | Discharge: 2019-03-15 | Disposition: A | Discharge status: Home / Self Care | Payer: BC Managed Care – PPO | Source: Ambulatory Visit | Attending: Urology | Admitting: Urology

## 2019-03-15 ENCOUNTER — Other Ambulatory Visit (HOSPITAL_COMMUNITY): Payer: Self-pay | Admitting: Urology

## 2019-03-15 DIAGNOSIS — Z85528 Personal history of other malignant neoplasm of kidney: Secondary | ICD-10-CM

## 2019-03-15 DIAGNOSIS — R918 Other nonspecific abnormal finding of lung field: Secondary | ICD-10-CM | POA: Diagnosis not present

## 2019-03-21 DIAGNOSIS — Z85528 Personal history of other malignant neoplasm of kidney: Secondary | ICD-10-CM | POA: Diagnosis not present

## 2019-04-27 ENCOUNTER — Ambulatory Visit: Payer: BC Managed Care – PPO | Attending: Internal Medicine

## 2019-04-27 DIAGNOSIS — Z23 Encounter for immunization: Secondary | ICD-10-CM

## 2019-04-27 NOTE — Progress Notes (Signed)
   Covid-19 Vaccination Clinic  Name:  TZIPA APPIAH    MRN: [id number] DOB: 13-Jul-1955  04/27/2019  Ms. Nylen was observed post Covid-19 immunization for 15 minutes without incident. She was provided with Vaccine Information Sheet and instruction to access the V-Safe system.   Ms. Spearman was instructed to call 911 with any severe reactions post vaccine: Marland Kitchen Difficulty breathing  . Swelling of face and throat  . A fast heartbeat  . A bad rash all over body  . Dizziness and weakness   Immunizations Administered    Name Date Dose VIS Date Route   Pfizer COVID-19 Vaccine 04/27/2019 12:58 PM 0.3 mL 01/12/2019 Intramuscular   Manufacturer: [place]   Lot: [id number]   [place]: [id number]

## 2019-05-10 ENCOUNTER — Encounter: Payer: Self-pay | Admitting: Gastroenterology

## 2019-05-22 ENCOUNTER — Ambulatory Visit: Payer: BC Managed Care – PPO | Attending: Internal Medicine

## 2019-05-22 DIAGNOSIS — Z23 Encounter for immunization: Secondary | ICD-10-CM

## 2019-05-22 NOTE — Progress Notes (Signed)
   Covid-19 Vaccination Clinic  Name:  Shelby Grant    MRN: [id number] DOB: Jun 28, 1955  05/22/2019  [name] was observed post Covid-19 immunization for 15 minutes without incident. She was provided with Vaccine Information Sheet and instruction to access the V-Safe system.   [name] was instructed to call 911 with any severe reactions post vaccine: Marland Kitchen Difficulty breathing  . Swelling of face and throat  . A fast heartbeat  . A bad rash all over body  . Dizziness and weakness   Immunizations Administered    Name Date Dose VIS Date Route   Pfizer COVID-19 Vaccine 05/22/2019 12:35 PM 0.3 mL 03/28/2018 Intramuscular   Manufacturer: [place]   Lot: [id number]   [place]: [id number]

## 2019-06-20 ENCOUNTER — Telehealth: Payer: Self-pay | Admitting: *Deleted

## 2019-06-20 NOTE — Telephone Encounter (Signed)
[name],  This pt is scheduled for a colon 6-4 with Dr Havery Moros. Reviewing her chart, in 12/ 2016 Anesthesia used a Glide scope to intubate her due to jaw locking up in a previous surgery- they documented use of Glide scope to limit jaw manipulation as she had issues previously.  Please review and make sure she qualifies for [facility].  Thanks,Marie PV

## 2019-06-20 NOTE — Telephone Encounter (Signed)
Shelby Grant,  I have reviewed this pt's chart and discussed her intubation hx with the CRNA who will be providing her care at [facility].  He has taken this situation under advisement and she is approved for anesthetic care at [facility].  Thanks much,  Osvaldo Angst

## 2019-06-22 ENCOUNTER — Other Ambulatory Visit: Payer: Self-pay

## 2019-06-22 ENCOUNTER — Encounter: Payer: Self-pay | Admitting: Gastroenterology

## 2019-06-22 ENCOUNTER — Ambulatory Visit (AMBULATORY_SURGERY_CENTER): Payer: Self-pay

## 2019-06-22 VITALS — Ht 66.0 in | Wt 132.0 lb

## 2019-06-22 DIAGNOSIS — Z1211 Encounter for screening for malignant neoplasm of colon: Secondary | ICD-10-CM

## 2019-06-22 MED ORDER — SUTAB 1479-225-188 MG PO TABS: 12.0000 | ORAL_TABLET | ORAL | 0 refills | Status: DC

## 2019-06-22 NOTE — Progress Notes (Signed)
No egg or soy allergy known to patient  No issues with past sedation with any surgeries  or procedures, no intubation problems  No diet pills per patient No home 02 use per patient  No blood thinners per patient  Pt denies issues with constipation  No A fib or A flutter  EMMI video sent to pt's e mail   Pt has completed covid vaccine series  Due to the COVID-19 pandemic we are asking patients to follow these guidelines. Please only bring one care partner. Please be aware that your care partner may wait in the car in the parking lot or if they feel like they will be too hot to wait in the car, they may wait in the lobby on the 4th floor. All care partners are required to wear a mask the entire time (we do not have any that we can provide them), they need to practice social distancing, and we will do a Covid check for all patient's and care partners when you arrive. Also we will check their temperature and your temperature. If the care partner waits in their car they need to stay in the parking lot the entire time and we will call them on their cell phone when the patient is ready for discharge so they can bring the car to the front of the building. Also all patient's will need to wear a mask into building.

## 2019-06-26 ENCOUNTER — Other Ambulatory Visit: Payer: Self-pay

## 2019-06-26 DIAGNOSIS — Z1211 Encounter for screening for malignant neoplasm of colon: Secondary | ICD-10-CM

## 2019-06-26 MED ORDER — SUTAB 1479-225-188 MG PO TABS: 1.0000 | ORAL_TABLET | ORAL | 0 refills | Status: DC

## 2019-06-26 NOTE — Progress Notes (Signed)
Resent script with sutab codes

## 2019-06-28 DIAGNOSIS — S0501XA Injury of conjunctiva and corneal abrasion without foreign body, right eye, initial encounter: Secondary | ICD-10-CM | POA: Diagnosis not present

## 2019-07-06 ENCOUNTER — Other Ambulatory Visit: Payer: Self-pay

## 2019-07-06 ENCOUNTER — Ambulatory Visit (AMBULATORY_SURGERY_CENTER): Payer: BC Managed Care – PPO | Admitting: Gastroenterology

## 2019-07-06 ENCOUNTER — Encounter: Payer: Self-pay | Admitting: Gastroenterology

## 2019-07-06 VITALS — BP 134/70 | HR 61 | Temp 97.3°F | Resp 18 | Ht 66.0 in | Wt 132.0 lb

## 2019-07-06 DIAGNOSIS — D123 Benign neoplasm of transverse colon: Secondary | ICD-10-CM

## 2019-07-06 DIAGNOSIS — D125 Benign neoplasm of sigmoid colon: Secondary | ICD-10-CM

## 2019-07-06 DIAGNOSIS — K635 Polyp of colon: Secondary | ICD-10-CM | POA: Diagnosis not present

## 2019-07-06 DIAGNOSIS — Z86010 Personal history of colonic polyps: Secondary | ICD-10-CM | POA: Diagnosis not present

## 2019-07-06 DIAGNOSIS — Z1211 Encounter for screening for malignant neoplasm of colon: Secondary | ICD-10-CM | POA: Diagnosis not present

## 2019-07-06 DIAGNOSIS — D124 Benign neoplasm of descending colon: Secondary | ICD-10-CM

## 2019-07-06 MED ORDER — SODIUM CHLORIDE 0.9 % IV SOLN: 500.0000 mL | INTRAVENOUS | Status: DC

## 2019-07-06 NOTE — Op Note (Signed)
[facility] Patient Name: Shelby Grant Procedure Date: 07/06/2019 8:22 AM MRN: [id number] Endoscopist: Remo Lipps P. Havery Moros , MD Age: 64 Referring MD:  Date of Birth: [birth year] Gender: Female Account #: [id number] Procedure:                Colonoscopy Indications:              Screening for colorectal malignant neoplasm Medicines:                Monitored Anesthesia Care Procedure:                Pre-Anesthesia Assessment:                           - Prior to the procedure, a History and Physical                            was performed, and patient medications and                            allergies were reviewed. The patient's tolerance of                            previous anesthesia was also reviewed. The risks                            and benefits of the procedure and the sedation                            options and risks were discussed with the patient.                            All questions were answered, and informed consent                            was obtained. Prior Anticoagulants: The patient has                            taken no previous anticoagulant or antiplatelet                            agents. ASA Grade Assessment: II - A patient with                            mild systemic disease. After reviewing the risks                            and benefits, the patient was deemed in                            satisfactory condition to undergo the procedure.                           After obtaining informed consent, the colonoscope  was passed under direct vision. Throughout the                            procedure, the patient's blood pressure, pulse, and                            oxygen saturations were monitored continuously. The                            Colonoscope was introduced through the anus and                            advanced to the the cecum, identified by                            appendiceal orifice and  ileocecal valve. The                            colonoscopy was performed without difficulty. The                            patient tolerated the procedure well. The quality                            of the bowel preparation was good. The ileocecal                            valve, appendiceal orifice, and rectum were                            photographed. Scope In: 8:41:36 AM Scope Out: 9:02:52 AM Scope Withdrawal Time: 0 hours 13 minutes 54 seconds  Total Procedure Duration: 0 hours 21 minutes 16 seconds  Findings:                 The perianal and digital rectal examinations were                            normal.                           A 4 mm polyp was found in the transverse colon. The                            polyp was sessile. The polyp was removed with a                            cold snare. Resection and retrieval were complete.                           A 3 to 4 mm polyp was found in the descending                            colon. The polyp was flat. The polyp was removed  with a cold snare. Resection and retrieval were                            complete.                           A 3 mm polyp was found in the sigmoid colon. The                            polyp was sessile. The polyp was removed with a                            cold snare. Resection and retrieval were complete.                           A few small-mouthed diverticula were found in the                            sigmoid colon.                           The exam was otherwise without abnormality. Complications:            No immediate complications. Estimated blood loss:                            Minimal. Estimated Blood Loss:     Estimated blood loss was minimal. Impression:               - One 4 mm polyp in the transverse colon, removed                            with a cold snare. Resected and retrieved.                           - One 3 to 4 mm polyp in the descending  colon,                            removed with a cold snare. Resected and retrieved.                           - One 3 mm polyp in the sigmoid colon, removed with                            a cold snare. Resected and retrieved.                           - Diverticulosis in the sigmoid colon.                           - The examination was otherwise normal. Recommendation:           - Patient has a contact number available for  emergencies. The signs and symptoms of potential                            delayed complications were discussed with the                            patient. Return to normal activities tomorrow.                            Written discharge instructions were provided to the                            patient.                           - Resume previous diet.                           - Continue present medications.                           - Await pathology results. Remo Lipps P. Havery Moros, MD 07/06/2019 9:06:28 AM This report has been signed electronically.

## 2019-07-06 NOTE — Progress Notes (Signed)
Called to room to assist during endoscopic procedure.  Patient ID and intended procedure confirmed with present staff. Received instructions for my participation in the procedure from the performing physician.  

## 2019-07-06 NOTE — Patient Instructions (Signed)
YOU HAD AN ENDOSCOPIC PROCEDURE TODAY AT THE [facility]:   Refer to the procedure report that was given to you for any specific questions about what was found during the examination.  If the procedure report does not answer your questions, please call your gastroenterologist to clarify.  If you requested that your care partner not be given the details of your procedure findings, then the procedure report has been included in a sealed envelope for you to review at your convenience later.  YOU SHOULD EXPECT: Some feelings of bloating in the abdomen. Passage of more gas than usual.  Walking can help get rid of the air that was put into your GI tract during the procedure and reduce the bloating. If you had a lower endoscopy (such as a colonoscopy or flexible sigmoidoscopy) you may notice spotting of blood in your stool or on the toilet paper. If you underwent a bowel prep for your procedure, you may not have a normal bowel movement for a few days.  Please Note:  You might notice some irritation and congestion in your nose or some drainage.  This is from the oxygen used during your procedure.  There is no need for concern and it should clear up in a day or so.  SYMPTOMS TO REPORT IMMEDIATELY:   Following lower endoscopy (colonoscopy or flexible sigmoidoscopy):  Excessive amounts of blood in the stool  Significant tenderness or worsening of abdominal pains  Swelling of the abdomen that is new, acute  Fever of 100F or higher   For urgent or emergent issues, a gastroenterologist can be reached at any hour by calling [phone]. Do not use MyChart messaging for urgent concerns.    DIET:  We do recommend a small meal at first, but then you may proceed to your regular diet.  Drink plenty of fluids but you should avoid alcoholic beverages for 24 hours.  MEDICATIONS: Continue present medications.  Please see handouts given to you by your recovery nurse.  ACTIVITY:  You should plan to  take it easy for the rest of today and you should NOT DRIVE or use heavy machinery until tomorrow (because of the sedation medicines used during the test).    FOLLOW UP: Our staff will call the number listed on your records 48-72 hours following your procedure to check on you and address any questions or concerns that you may have regarding the information given to you following your procedure. If we do not reach you, we will leave a message.  We will attempt to reach you two times.  During this call, we will ask if you have developed any symptoms of COVID 19. If you develop any symptoms (ie: fever, flu-like symptoms, shortness of breath, cough etc.) before then, please call [phone].  If you test positive for Covid 19 in the 2 weeks post procedure, please call and report this information to us.    If any biopsies were taken you will be contacted by phone or by letter within the next 1-3 weeks.  Please call us at [phone] if you have not heard about the biopsies in 3 weeks.   Thank you for allowing us to provide for your healthcare needs today.   SIGNATURES/CONFIDENTIALITY: You and/or your care partner have signed paperwork which will be entered into your electronic medical record.  These signatures attest to the fact that that the information above on your After Visit Summary has been reviewed and is understood.  Full responsibility of the   confidentiality of this discharge information lies with you and/or your care-partner. 

## 2019-07-06 NOTE — Progress Notes (Signed)
Report given to PACU, vss 

## 2019-07-06 NOTE — Progress Notes (Signed)
Vs CW I have reviewed the patient's medical history in detail and updated the computerized patient record.   

## 2019-07-10 ENCOUNTER — Telehealth: Payer: Self-pay

## 2019-07-10 NOTE — Telephone Encounter (Signed)
First post procedure follow up call, no answer 

## 2019-07-10 NOTE — Telephone Encounter (Signed)
  Follow up Call-  Call back number 07/06/2019  Post procedure Call Back phone  # [phone]  Permission to leave phone message Yes  Some recent data might be hidden     Patient questions:  Do you have a fever, pain , or abdominal swelling? No. Pain Score  0 *  Have you tolerated food without any problems? Yes.    Have you been able to return to your normal activities? Yes.    Do you have any questions about your discharge instructions: Diet   No. Medications  No. Follow up visit  No.  Do you have questions or concerns about your Care? No.  Actions: * If pain score is 4 or above: No action needed, pain <4.  1. Have you developed a fever since your procedure? no  2.   Have you had an respiratory symptoms (SOB or cough) since your procedure? no  3.   Have you tested positive for COVID 19 since your procedure no  4.   Have you had any family members/close contacts diagnosed with the COVID 19 since your procedure?  no   If yes to any of these questions please route to Joylene [name], RN and Erenest Rasher, RN

## 2019-07-23 ENCOUNTER — Other Ambulatory Visit: Payer: Self-pay | Admitting: Internal Medicine

## 2019-08-12 ENCOUNTER — Other Ambulatory Visit: Payer: Self-pay | Admitting: Internal Medicine

## 2019-08-12 DIAGNOSIS — F411 Generalized anxiety disorder: Secondary | ICD-10-CM

## 2019-08-12 NOTE — Telephone Encounter (Signed)
Please refill as per office routine med refill policy (all routine meds refilled for 3 mo or monthly per pt preference up to one year from last visit, then month to month grace period for 3 mo, then further med refills will have to be denied)  

## 2019-08-13 ENCOUNTER — Encounter: Payer: BC Managed Care – PPO | Admitting: Internal Medicine

## 2019-09-07 ENCOUNTER — Other Ambulatory Visit (INDEPENDENT_AMBULATORY_CARE_PROVIDER_SITE_OTHER): Payer: BC Managed Care – PPO

## 2019-09-07 DIAGNOSIS — Z0000 Encounter for general adult medical examination without abnormal findings: Secondary | ICD-10-CM

## 2019-09-07 DIAGNOSIS — R739 Hyperglycemia, unspecified: Secondary | ICD-10-CM | POA: Diagnosis not present

## 2019-09-07 LAB — CBC WITH DIFFERENTIAL/PLATELET
Basophils Absolute: 0 10*3/uL (ref 0.0–0.1)
Basophils Relative: 0.8 % (ref 0.0–3.0)
Eosinophils Absolute: 0.3 10*3/uL (ref 0.0–0.7)
Eosinophils Relative: 4.5 % (ref 0.0–5.0)
HCT: 41.2 % (ref 36.0–46.0)
Hemoglobin: 13.8 g/dL (ref 12.0–15.0)
Lymphocytes Relative: 34.8 % (ref 12.0–46.0)
Lymphs Abs: 2 10*3/uL (ref 0.7–4.0)
MCHC: 33.5 g/dL (ref 30.0–36.0)
MCV: 93 fl (ref 78.0–100.0)
Monocytes Absolute: 0.6 10*3/uL (ref 0.1–1.0)
Monocytes Relative: 10.9 % (ref 3.0–12.0)
Neutro Abs: 2.9 10*3/uL (ref 1.4–7.7)
Neutrophils Relative %: 49 % (ref 43.0–77.0)
Platelets: 239 10*3/uL (ref 150.0–400.0)
RBC: 4.43 Mil/uL (ref 3.87–5.11)
RDW: 13.7 % (ref 11.5–15.5)
WBC: 5.9 10*3/uL (ref 4.0–10.5)

## 2019-09-07 LAB — URINALYSIS, ROUTINE W REFLEX MICROSCOPIC
Bilirubin Urine: NEGATIVE
Ketones, ur: NEGATIVE
Leukocytes,Ua: NEGATIVE
Nitrite: POSITIVE — AB
Specific Gravity, Urine: 1.01 (ref 1.000–1.030)
Total Protein, Urine: NEGATIVE
Urine Glucose: NEGATIVE
Urobilinogen, UA: 0.2 (ref 0.0–1.0)
pH: 5.5 (ref 5.0–8.0)

## 2019-09-07 LAB — BASIC METABOLIC PANEL
BUN: 18 mg/dL (ref 6–23)
CO2: 31 mEq/L (ref 19–32)
Calcium: 9.6 mg/dL (ref 8.4–10.5)
Chloride: 103 mEq/L (ref 96–112)
Creatinine, Ser: 0.88 mg/dL (ref 0.40–1.20)
GFR: 64.86 mL/min (ref >60.00)
Glucose, Bld: 89 mg/dL (ref 70–99)
Potassium: 4.2 mEq/L (ref 3.5–5.1)
Sodium: 138 mEq/L (ref 135–145)

## 2019-09-07 LAB — LIPID PANEL
Cholesterol: 211 mg/dL — ABNORMAL HIGH (ref 0–200)
HDL: 104.8 mg/dL (ref >39.00)
LDL Cholesterol: 94 mg/dL (ref 0–99)
NonHDL: 106.12
Total CHOL/HDL Ratio: 2
Triglycerides: 62 mg/dL (ref 0.0–149.0)
VLDL: 12.4 mg/dL (ref 0.0–40.0)

## 2019-09-07 LAB — HEPATIC FUNCTION PANEL
ALT: 26 U/L (ref 0–35)
AST: 19 U/L (ref 0–37)
Albumin: 4.4 g/dL (ref 3.5–5.2)
Alkaline Phosphatase: 58 U/L (ref 39–117)
Bilirubin, Direct: 0.1 mg/dL (ref 0.0–0.3)
Total Bilirubin: 0.7 mg/dL (ref 0.2–1.2)
Total Protein: 7 g/dL (ref 6.0–8.3)

## 2019-09-07 LAB — HEMOGLOBIN A1C: Hgb A1c MFr Bld: 5.7 % (ref 4.6–6.5)

## 2019-09-07 LAB — TSH: TSH: 1.58 u[IU]/mL (ref 0.35–4.50)

## 2019-09-08 ENCOUNTER — Encounter: Payer: Self-pay | Admitting: Internal Medicine

## 2019-09-18 ENCOUNTER — Other Ambulatory Visit: Payer: Self-pay

## 2019-09-18 ENCOUNTER — Telehealth: Payer: Self-pay | Admitting: *Deleted

## 2019-09-18 ENCOUNTER — Ambulatory Visit (INDEPENDENT_AMBULATORY_CARE_PROVIDER_SITE_OTHER): Payer: BC Managed Care – PPO | Admitting: Internal Medicine

## 2019-09-18 ENCOUNTER — Encounter: Payer: Self-pay | Admitting: Internal Medicine

## 2019-09-18 VITALS — BP 110/70 | HR 80 | Temp 98.3°F | Wt 136.6 lb

## 2019-09-18 DIAGNOSIS — R739 Hyperglycemia, unspecified: Secondary | ICD-10-CM

## 2019-09-18 DIAGNOSIS — E785 Hyperlipidemia, unspecified: Secondary | ICD-10-CM

## 2019-09-18 DIAGNOSIS — Z0000 Encounter for general adult medical examination without abnormal findings: Secondary | ICD-10-CM | POA: Diagnosis not present

## 2019-09-18 DIAGNOSIS — F411 Generalized anxiety disorder: Secondary | ICD-10-CM | POA: Diagnosis not present

## 2019-09-18 DIAGNOSIS — M810 Age-related osteoporosis without current pathological fracture: Secondary | ICD-10-CM

## 2019-09-18 DIAGNOSIS — E559 Vitamin D deficiency, unspecified: Secondary | ICD-10-CM

## 2019-09-18 MED ORDER — ROSUVASTATIN CALCIUM 20 MG PO TABS: ORAL_TABLET | ORAL | 3 refills | Status: DC

## 2019-09-18 MED ORDER — METOPROLOL SUCCINATE ER 25 MG PO TB24: 12.5000 mg | ORAL_TABLET | Freq: Two times a day (BID) | ORAL | 3 refills | Status: DC

## 2019-09-18 MED ORDER — BUPROPION HCL ER (XL) 300 MG PO TB24: 300.0000 mg | ORAL_TABLET | Freq: Every day | ORAL | 3 refills | Status: DC

## 2019-09-18 NOTE — Progress Notes (Addendum)
Subjective:    Patient ID: Shelby Grant, female    DOB: 1955/02/05, 64 y.o.   MRN: [id number]  HPI  Here for wellness and f/u;  Overall doing ok;  Pt denies Chest pain, worsening SOB, DOE, wheezing, orthopnea, PND, worsening LE edema, palpitations, dizziness or syncope.  Pt denies neurological change such as new headache, facial or extremity weakness.  Pt denies polydipsia, polyuria, or low sugar symptoms. Pt states overall good compliance with treatment and medications, good tolerability, and has been trying to follow appropriate diet.  Pt denies worsening depressive symptoms, suicidal ideation or panic. No fever, night sweats, wt loss, loss of appetite, or other constitutional symptoms.  Pt states good ability with ADL's, has low fall risk, home safety reviewed and adequate, no other significant changes in hearing or vision, and only occasionally active with exercise.  Father with dementia living with her, has put off mamgram but plans to do soon.   Past Medical History:  Diagnosis Date  . ALLERGIC RHINITIS 07/03/2008  . Allergy    seasonal  . ANXIETY 06/02/2007  . Arthritis   . BELL'S PALSY, LEFT 12/02/2008  . Cancer ([facility]) 01/16/2015   kidney  . CELLULITIS, LEFT LEG 09/14/2008  . CHEST PAIN 07/07/2009  . Complication of anesthesia 08-28-2014   "work up in recovery room with jaw locked open"  . CORNS AND CALLUSES 07/03/2008  . Depression   . FOOT PAIN 09/22/2009  . History of kidney cancer   . IRREGULAR MENSES 06/03/2008  . Neoplasm    left renal  . OCD (obsessive compulsive disorder)   . Renal disorder   . Stress fracture of ankle since Jun 29, 2014   left  . Supraventricular tachycardia ([facility]) 06/02/2007   Atrial tachycardia with Wenckebach periodicity   Past Surgical History:  Procedure Laterality Date  . APPENDECTOMY  20016  . BLEPHAROPLASTY Bilateral 10/31/2017  . COLONOSCOPY  2009  . HIP ARTHROPLASTY Right 11/18/2017  . LAPAROSCOPIC APPENDECTOMY N/A 08/28/2014   Procedure:  APPENDECTOMY LAPAROSCOPIC;  Surgeon: Judeth Horn, MD;  Location: [facility];  Service: General;  Laterality: N/A;  . ROBOTIC ASSITED PARTIAL NEPHRECTOMY Left 01/06/2015   Procedure: ROBOTIC ASSISTED PARTIAL NEPHRECTOMY;  Surgeon: Raynelle Bring, MD;  [facility];  Service: Urology;  Laterality: Left;  . WISDOM TOOTH EXTRACTION      reports that she has never smoked. She has never used smokeless tobacco. She reports current alcohol use of about 2.0 standard drinks of alcohol per week. She reports that she does not use drugs. family history includes Asthma in her sister; Cancer in her mother; Colon polyps in her sister. Allergies  Allergen Reactions  . Latex     REACTION: rash  . Amoxicillin Rash    Has patient had a PCN reaction causing immediate rash, facial/tongue/throat swelling, SOB or lightheadedness with hypotension: unkown Has patient had a PCN reaction causing severe rash involving mucus membranes or skin necrosis: no Has patient had a PCN reaction that required hospitalization No Has patient had a PCN reaction occurring within the last 10 years: unknown If all of the above answers are "NO", then may proceed with Cephalosporin use.   Current Outpatient Medications on File Prior to Visit  Medication Sig Dispense Refill  . Acetaminophen (TYLENOL PO) Take 1,000 mg by mouth daily as needed.     Marland Kitchen ALFALFA PO Take by mouth.    Marland Kitchen b complex vitamins tablet Take 1 tablet by mouth daily.    . Calcium-Magnesium-Vitamin D (  CALCIUM MAGNESIUM PO) Take 4 tablets by mouth daily.     . clonazePAM (KLONOPIN) 1 MG tablet TAKE 1 TABLET(1 MG) BY MOUTH DAILY 30 tablet 5  . co-enzyme Q-10 30 MG capsule Take 30 mg by mouth 3 (three) times daily.    . cyanocobalamin 100 MCG tablet Take 100 mcg by mouth daily.    Marland Kitchen ibuprofen (ADVIL) 200 MG tablet Take 200 mg by mouth every 6 (six) hours as needed.    . Multiple Vitamin (MULTIVITAMIN) capsule Take 1 capsule by mouth daily.    . Probiotic Product (ALIGN) 4 MG  CAPS Take 1 capsule by mouth daily.     . TURMERIC PO Take 3 tablets by mouth daily.     Marland Kitchen zinc gluconate 50 MG tablet Take 50 mg by mouth daily.    . [DISCONTINUED] estrogen, conjugated,-medroxyprogesterone (PREMPRO) 0.625-2.5 MG per tablet Take 1 tablet by mouth daily. 28 tablet 11   No current facility-administered medications on file prior to visit.   Review of Systems All otherwise neg per pt =    Objective:   Physical Exam BP 110/70 (BP Location: Left Arm)   Pulse 80   Temp 98.3 F (36.8 C) (Oral)   Wt 136 lb 9.6 oz (62 kg)   LMP 08/01/2012   SpO2 96%   BMI 22.05 kg/m  VS noted,  Constitutional: Pt appears in NAD HENT: Head: NCAT.  Right Ear: External ear normal.  Left Ear: External ear normal.  Eyes: . Pupils are equal, round, and reactive to light. Conjunctivae and EOM are normal Nose: without d/c or deformity Neck: Neck supple. Gross normal ROM Cardiovascular: Normal rate and regular rhythm.   Pulmonary/Chest: Effort normal and breath sounds without rales or wheezing.  Abd:  Soft, NT, ND, + BS, no organomegaly Neurological: Pt is alert. At baseline orientation, motor grossly intact Skin: Skin is warm. No rashes, other new lesions, no LE edema Psychiatric: Pt behavior is normal without agitation  All otherwise neg per pt Lab Results  Component Value Date   WBC 5.9 09/07/2019   HGB 13.8 09/07/2019   HCT 41.2 09/07/2019   PLT 239.0 09/07/2019   GLUCOSE 89 09/07/2019   CHOL 211 (H) 09/07/2019   TRIG 62.0 09/07/2019   HDL 104.80 09/07/2019   LDLDIRECT 137.5 10/06/2012   LDLCALC 94 09/07/2019   ALT 26 09/07/2019   AST 19 09/07/2019   NA 138 09/07/2019   K 4.2 09/07/2019   CL 103 09/07/2019   CREATININE 0.88 09/07/2019   BUN 18 09/07/2019   CO2 31 09/07/2019   TSH 1.58 09/07/2019   HGBA1C 5.7 09/07/2019      Assessment & Plan:

## 2019-09-18 NOTE — Telephone Encounter (Signed)
Pt saw MD today for her annual physical. He is wanting her to get started on the Prolia injection. Inform will forward msg to [facility] to check w/insurance, and once med has been approved she will contact pt to set-up.Marland KitchenJohny Chess

## 2019-09-18 NOTE — Patient Instructions (Signed)
Please continue all other medications as before, and refills have been done if requested.  Please have the pharmacy call with any other refills you may need.  Please continue your efforts at being more active, low cholesterol diet, and weight control.  You are otherwise up to date with prevention measures today.  Please keep your appointments with your specialists as you may have planned  We have discussed the Cardiac CT Score test to measure the calcification level (if any) in your heart arteries.  This test has been ordered in our [place], so please call  CT directly, as they prefer this, at [phone] to be scheduled.  You will be contacted regarding the referral for: Prolia start here in the office  Please make an Appointment to return for your 1 year visit, or sooner if needed, with Lab testing by Appointment as well, to be done about 3-5 days before at the [facility] (so this is for TWO appointments - please see the scheduling desk as you leave)

## 2019-09-19 NOTE — Telephone Encounter (Signed)
Submitting for verification. Will contact patient to schedule.

## 2019-09-22 ENCOUNTER — Encounter: Payer: Self-pay | Admitting: Internal Medicine

## 2019-09-22 DIAGNOSIS — M810 Age-related osteoporosis without current pathological fracture: Secondary | ICD-10-CM | POA: Insufficient documentation

## 2019-09-22 NOTE — Assessment & Plan Note (Signed)
For prolia new start,  to f/u any worsening symptoms or concerns

## 2019-09-22 NOTE — Assessment & Plan Note (Signed)

## 2019-09-22 NOTE — Assessment & Plan Note (Signed)
stable overall by history and exam, recent data reviewed with pt, and pt to continue medical treatment as before,  to f/u any worsening symptoms or concerns  

## 2019-09-22 NOTE — Assessment & Plan Note (Signed)
For ct cardiac score, consider d/c statin

## 2019-09-25 NOTE — Telephone Encounter (Signed)
Insurance was submitted and verified for Prolia. Due to her high deductible, it would be best for her to use pharmacy benefits and the Prolia copay card, otherwise she would be responsible for 100% of the cost.  If she would like to continue, Prolia would need to be sent in to her pharmacy. They would order and fill it there and then she would pick it up and bring it to our office to be administered (or if filled by a speciality pharmacy, they would mail it to [place]).  The copay card will also help with the cost that she would owe from the pharmacy side (and would only leave a $25 out of pocket cost that she would be responsible for). If interested, she can visit [email or web address] to enroll. She would then give the card information to the pharmacy directly to cover the cost.

## 2019-09-25 NOTE — Telephone Encounter (Signed)
Ok to please contact pt to ask if she would like to proceed

## 2019-09-28 MED ORDER — DENOSUMAB 60 MG/ML ~~LOC~~ SOSY: 60.0000 mg | PREFILLED_SYRINGE | Freq: Once | SUBCUTANEOUS | 0 refills | Status: AC

## 2019-09-28 MED ORDER — DENOSUMAB 60 MG/ML ~~LOC~~ SOSY: 60.0000 mg | PREFILLED_SYRINGE | Freq: Once | SUBCUTANEOUS | 0 refills | Status: DC

## 2019-09-28 NOTE — Telephone Encounter (Signed)
Pt return call back. Gave her response from [place] for the [place]. Pt would like to proceed. Requesting rx to be send to [facility],,,/[facility]

## 2019-09-28 NOTE — Telephone Encounter (Signed)
Called pt there was no answer LMOM RTC...Shelby Grant

## 2019-09-28 NOTE — Addendum Note (Signed)
Addended by: Biagio Borg on: 09/28/2019 08:53 PM   Modules accepted: Orders

## 2019-09-28 NOTE — Addendum Note (Signed)
Addended by: Earnstine Regal on: 09/28/2019 02:33 PM   Modules accepted: Orders

## 2019-10-03 ENCOUNTER — Other Ambulatory Visit: Payer: Self-pay

## 2019-10-03 ENCOUNTER — Ambulatory Visit (INDEPENDENT_AMBULATORY_CARE_PROVIDER_SITE_OTHER)
Admission: RE | Admit: 2019-10-03 | Discharge: 2019-10-03 | Disposition: A | Discharge status: Home / Self Care | Payer: Self-pay | Source: Ambulatory Visit | Attending: Internal Medicine | Admitting: Internal Medicine

## 2019-10-03 ENCOUNTER — Encounter: Payer: Self-pay | Admitting: Internal Medicine

## 2019-10-03 DIAGNOSIS — E785 Hyperlipidemia, unspecified: Secondary | ICD-10-CM

## 2019-10-04 ENCOUNTER — Encounter: Payer: Self-pay | Admitting: Internal Medicine

## 2019-10-04 ENCOUNTER — Telehealth: Payer: Self-pay | Admitting: Internal Medicine

## 2019-10-04 DIAGNOSIS — D485 Neoplasm of uncertain behavior of skin: Secondary | ICD-10-CM | POA: Diagnosis not present

## 2019-10-04 DIAGNOSIS — L820 Inflamed seborrheic keratosis: Secondary | ICD-10-CM | POA: Diagnosis not present

## 2019-10-04 DIAGNOSIS — K130 Diseases of lips: Secondary | ICD-10-CM | POA: Diagnosis not present

## 2019-10-04 DIAGNOSIS — L989 Disorder of the skin and subcutaneous tissue, unspecified: Secondary | ICD-10-CM | POA: Diagnosis not present

## 2019-10-04 NOTE — Telephone Encounter (Signed)
    1.Medication Requested:clonazePAM (KLONOPIN) 1 MG tablet  2. Pharmacy (Name, Street, [place]):[facility] [phone] - [place], [place] [address]  3. On Med List: yes  4. Last Visit with PCP: 09/18/19  5. Next visit date with PCP: n/a   Agent: Please be advised that RX refills may take up to 3 business days. We ask that you follow-up with your pharmacy.

## 2019-10-05 MED ORDER — CLONAZEPAM 1 MG PO TABS
ORAL_TABLET | ORAL | 5 refills | Status: DC
Start: 2019-10-05 — End: 2020-04-19

## 2019-10-05 NOTE — Telephone Encounter (Signed)
Done erx 

## 2019-10-19 ENCOUNTER — Other Ambulatory Visit: Payer: Self-pay | Admitting: Internal Medicine

## 2019-10-19 NOTE — Telephone Encounter (Signed)
Please refill as per office routine med refill policy (all routine meds refilled for 3 mo or monthly per pt preference up to one year from last visit, then month to month grace period for 3 mo, then further med refills will have to be denied)  

## 2019-10-31 ENCOUNTER — Other Ambulatory Visit: Payer: Self-pay | Admitting: Internal Medicine

## 2019-10-31 MED ORDER — DENOSUMAB 60 MG/ML ~~LOC~~ SOSY: 60.0000 mg | PREFILLED_SYRINGE | Freq: Once | SUBCUTANEOUS | 1 refills | Status: DC

## 2019-11-01 MED ORDER — DENOSUMAB 60 MG/ML ~~LOC~~ SOSY: 60.0000 mg | PREFILLED_SYRINGE | Freq: Once | SUBCUTANEOUS | 1 refills | Status: DC

## 2019-11-01 NOTE — Addendum Note (Signed)
Addended by: Biagio Borg on: 11/01/2019 12:39 PM   Modules accepted: Orders

## 2019-11-20 DIAGNOSIS — L820 Inflamed seborrheic keratosis: Secondary | ICD-10-CM | POA: Diagnosis not present

## 2019-11-20 DIAGNOSIS — L218 Other seborrheic dermatitis: Secondary | ICD-10-CM | POA: Diagnosis not present

## 2019-11-20 DIAGNOSIS — L718 Other rosacea: Secondary | ICD-10-CM | POA: Diagnosis not present

## 2019-11-20 DIAGNOSIS — K130 Diseases of lips: Secondary | ICD-10-CM | POA: Diagnosis not present

## 2019-12-08 ENCOUNTER — Encounter: Payer: Self-pay | Admitting: Internal Medicine

## 2019-12-10 NOTE — Telephone Encounter (Signed)
Can Prolia be resent to [facility] at [address] per patient's message below please?

## 2019-12-11 ENCOUNTER — Other Ambulatory Visit: Payer: Self-pay

## 2019-12-11 DIAGNOSIS — M810 Age-related osteoporosis without current pathological fracture: Secondary | ICD-10-CM

## 2019-12-11 MED ORDER — DENOSUMAB 60 MG/ML ~~LOC~~ SOSY: 60.0000 mg | PREFILLED_SYRINGE | SUBCUTANEOUS | 1 refills | Status: DC

## 2019-12-11 MED ORDER — DENOSUMAB 60 MG/ML ~~LOC~~ SOSY: 60.0000 mg | PREFILLED_SYRINGE | Freq: Once | SUBCUTANEOUS | 1 refills | Status: DC

## 2019-12-13 ENCOUNTER — Other Ambulatory Visit: Payer: Self-pay

## 2019-12-13 DIAGNOSIS — M810 Age-related osteoporosis without current pathological fracture: Secondary | ICD-10-CM

## 2019-12-13 MED ORDER — DENOSUMAB 60 MG/ML ~~LOC~~ SOSY: 60.0000 mg | PREFILLED_SYRINGE | SUBCUTANEOUS | 1 refills | Status: DC

## 2019-12-13 NOTE — Telephone Encounter (Signed)
Patient called stating that [place] is requiring that this be sent in to [place].  Can you re-send this please?  Once it has been received, patient will contact the pharmacy to handle payment and Prolia will be mailed to [place]. When it has arrived, we can call the patient to have her come in for the injection.

## 2020-01-03 DIAGNOSIS — D2371 Other benign neoplasm of skin of right lower limb, including hip: Secondary | ICD-10-CM | POA: Diagnosis not present

## 2020-01-03 DIAGNOSIS — D225 Melanocytic nevi of trunk: Secondary | ICD-10-CM | POA: Diagnosis not present

## 2020-01-03 DIAGNOSIS — L821 Other seborrheic keratosis: Secondary | ICD-10-CM | POA: Diagnosis not present

## 2020-01-03 DIAGNOSIS — L814 Other melanin hyperpigmentation: Secondary | ICD-10-CM | POA: Diagnosis not present

## 2020-01-17 NOTE — Telephone Encounter (Signed)
° °  Spouse calling for status on delivery pf Prolia. Requesting return phone call

## 2020-01-17 NOTE — Telephone Encounter (Signed)
Can you confirm if this was sent or if you have received anything regarding her Prolia?

## 2020-01-23 NOTE — Telephone Encounter (Signed)
     Spouse calling for status of Prolia

## 2020-01-24 ENCOUNTER — Other Ambulatory Visit: Payer: Self-pay

## 2020-01-24 DIAGNOSIS — M810 Age-related osteoporosis without current pathological fracture: Secondary | ICD-10-CM

## 2020-01-24 MED ORDER — DENOSUMAB 60 MG/ML ~~LOC~~ SOSY: 60.0000 mg | PREFILLED_SYRINGE | SUBCUTANEOUS | 1 refills | Status: DC

## 2020-01-24 NOTE — Telephone Encounter (Signed)
I received a call from the patient's husband stating that they received a text from [facility] [place] that her Prolia was ready to be picked up. This was to be sent to the [facility] so that it would be mailed. Can you confirm if it was sent to the Specialty Pharmacy?

## 2020-02-05 NOTE — Telephone Encounter (Signed)
Ok for accept as new pt's, but must be ok with Dr [place] as well, thanks

## 2020-02-21 ENCOUNTER — Telehealth: Payer: Self-pay | Admitting: Internal Medicine

## 2020-02-21 NOTE — Telephone Encounter (Signed)
Patients husband called and had some questions about his wife's Prolia injections. He said that she did have BCBS and she now has [employer]. Please call the patient back at [phone].

## 2020-02-25 NOTE — Telephone Encounter (Signed)
   To obtain auth for Prolia please contact [employer] [phone] Fax [phone]

## 2020-02-26 NOTE — Telephone Encounter (Signed)
Contact [employer] unfortunately they were not able to find the patient information   Contacted patient and left a vc for pt to call back.

## 2020-02-28 ENCOUNTER — Telehealth: Payer: Self-pay

## 2020-02-28 NOTE — Telephone Encounter (Signed)
Patient doesntnot have active BCBS when  I called for prolia PA....will call patient to obtain updated insurance information

## 2020-03-07 NOTE — Telephone Encounter (Signed)
[employer] called checking to see if we received the fax on 02.01.22 for the PA request  Key# [facility]

## 2020-03-14 NOTE — Telephone Encounter (Signed)
   Please call patient on mobile number to discuss Prolia

## 2020-03-17 ENCOUNTER — Other Ambulatory Visit: Payer: Self-pay | Admitting: Internal Medicine

## 2020-03-17 ENCOUNTER — Telehealth: Payer: Self-pay | Admitting: Internal Medicine

## 2020-03-17 DIAGNOSIS — M810 Age-related osteoporosis without current pathological fracture: Secondary | ICD-10-CM

## 2020-03-17 MED ORDER — DENOSUMAB 60 MG/ML ~~LOC~~ SOSY: 60.0000 mg | PREFILLED_SYRINGE | SUBCUTANEOUS | 1 refills | Status: DC

## 2020-03-17 NOTE — Telephone Encounter (Signed)
Patient is requesting another bone density scan to see the progression of the osteoporosis.  Last scan was in 2019  **Also, we need a prescription  sent over to the [facility] for her [place]  It needs to go through the pharmacy for copay assistance.

## 2020-03-17 NOTE — Telephone Encounter (Signed)
[place], [place] Phone:  [phone]  Fax:  [phone]     Send Prolia script to [place]

## 2020-03-24 ENCOUNTER — Other Ambulatory Visit (HOSPITAL_COMMUNITY): Payer: Self-pay | Admitting: Urology

## 2020-03-24 DIAGNOSIS — Z8551 Personal history of malignant neoplasm of bladder: Secondary | ICD-10-CM

## 2020-03-25 ENCOUNTER — Ambulatory Visit (HOSPITAL_COMMUNITY)
Admission: RE | Admit: 2020-03-25 | Discharge: 2020-03-25 | Disposition: A | Discharge status: Home / Self Care | Payer: 59 | Source: Ambulatory Visit | Attending: Urology | Admitting: Urology

## 2020-03-25 ENCOUNTER — Other Ambulatory Visit: Payer: Self-pay

## 2020-03-25 DIAGNOSIS — Z8551 Personal history of malignant neoplasm of bladder: Secondary | ICD-10-CM | POA: Insufficient documentation

## 2020-04-18 ENCOUNTER — Other Ambulatory Visit: Payer: Self-pay | Admitting: Internal Medicine

## 2020-04-26 NOTE — Addendum Note (Signed)
Encounter addended by: Annie Paras on: 04/26/2020 1:01 PM  Actions taken: Letter saved

## 2020-07-17 ENCOUNTER — Telehealth (INDEPENDENT_AMBULATORY_CARE_PROVIDER_SITE_OTHER): Payer: 59 | Admitting: Family Medicine

## 2020-07-17 DIAGNOSIS — R197 Diarrhea, unspecified: Secondary | ICD-10-CM | POA: Diagnosis not present

## 2020-07-17 NOTE — Progress Notes (Signed)
Virtual Visit via Video Note  I connected with Shelby Grant  on 07/17/20 at 12:40 PM EDT by a video enabled telemedicine application and verified that I am speaking with the correct person using two identifiers.  Location patient: home, [place] Location provider:work or home office Persons participating in the virtual visit: patient, provider  I discussed the limitations of evaluation and management by telemedicine and the availability of in person appointments. The patient expressed understanding and agreed to proceed.   HPI:  Acute telemedicine visit for Diarrhea: -Onset: 07/15/20 -Symptoms include: watery diarrhea - 3 episodes today, nausea, some mild abd pain, nasal congestion -Denies:fever, SOB, CP, body aches, inability to eat/drink/get out of bed -denies any recent travel, abx or sick contacts -drinking some Pedialyte -Pertinent past medical history:see below (? of IBS but symptoms remote) -Pertinent medication allergies:  Allergies  Allergen Reactions   Latex     REACTION: rash   Amoxicillin Rash    Has patient had a PCN reaction causing immediate rash, facial/tongue/throat swelling, SOB or lightheadedness with hypotension: unkown Has patient had a PCN reaction causing severe rash involving mucus membranes or skin necrosis: no Has patient had a PCN reaction that required hospitalization No Has patient had a PCN reaction occurring within the last 10 years: unknown If all of the above answers are "NO", then may proceed with Cephalosporin use.  -COVID-19 vaccine status: had 2 doses   ROS: See pertinent positives and negatives per HPI.  Past Medical History:  Diagnosis Date   ALLERGIC RHINITIS 07/03/2008   Allergy    seasonal   ANXIETY 06/02/2007   Arthritis    BELL'S PALSY, LEFT 12/02/2008   Cancer ([place]) 01/16/2015   kidney   CELLULITIS, LEFT LEG 09/14/2008   CHEST PAIN 7/0/3500   Complication of anesthesia 08-28-2014   "work up in recovery room with jaw locked open"   CORNS AND  CALLUSES 07/03/2008   Depression    FOOT PAIN 09/22/2009   History of kidney cancer    IRREGULAR MENSES 06/03/2008   Neoplasm    left renal   OCD (obsessive compulsive disorder)    Renal disorder    Stress fracture of ankle since Jun 29, 2014   left   Supraventricular tachycardia ([place]) 06/02/2007   Atrial tachycardia with Wenckebach periodicity    Past Surgical History:  Procedure Laterality Date   APPENDECTOMY  20016   BLEPHAROPLASTY Bilateral 10/31/2017   COLONOSCOPY  2009   HIP ARTHROPLASTY Right 11/18/2017   LAPAROSCOPIC APPENDECTOMY N/A 08/28/2014   Procedure: APPENDECTOMY LAPAROSCOPIC;  Surgeon: Judeth Horn, MD;  Location: [facility];  Service: General;  Laterality: N/A;   ROBOTIC ASSITED PARTIAL NEPHRECTOMY Left 01/06/2015   Procedure: ROBOTIC ASSISTED PARTIAL NEPHRECTOMY;  Surgeon: Raynelle Bring, MD;  [facility];  Service: Urology;  Laterality: Left;   WISDOM TOOTH EXTRACTION       Current Outpatient Medications:    Acetaminophen (TYLENOL PO), Take 1,000 mg by mouth daily as needed. , Disp: , Rfl:    ALFALFA PO, Take by mouth., Disp: , Rfl:    b complex vitamins tablet, Take 1 tablet by mouth daily., Disp: , Rfl:    buPROPion (WELLBUTRIN XL) 300 MG 24 hr tablet, Take 1 tablet (300 mg total) by mouth daily., Disp: 90 tablet, Rfl: 3   Calcium-Magnesium-Vitamin D (CALCIUM MAGNESIUM PO), Take 4 tablets by mouth daily. , Disp: , Rfl:    clonazePAM (KLONOPIN) 1 MG tablet, TAKE 1 TABLET(1 MG) BY MOUTH DAILY, Disp: 30 tablet, Rfl:  2   co-enzyme Q-10 30 MG capsule, Take 30 mg by mouth 3 (three) times daily., Disp: , Rfl:    cyanocobalamin 100 MCG tablet, Take 100 mcg by mouth daily., Disp: , Rfl:    denosumab (PROLIA) 60 MG/ML SOSY injection, INJECT 60 MG INTO THE SKIN EVERY 6 MONTHS, Disp: 1 mL, Rfl: 1   ibuprofen (ADVIL) 200 MG tablet, Take 200 mg by mouth every 6 (six) hours as needed., Disp: , Rfl:    metoprolol succinate (TOPROL-XL) 25 MG 24 hr tablet, Take 0.5 tablets (12.5 mg  total) by mouth 2 (two) times daily., Disp: 90 tablet, Rfl: 3   Multiple Vitamin (MULTIVITAMIN) capsule, Take 1 capsule by mouth daily., Disp: , Rfl:    Probiotic Product (ALIGN) 4 MG CAPS, Take 1 capsule by mouth daily. , Disp: , Rfl:    rosuvastatin (CRESTOR) 20 MG tablet, TAKE 1 TABLET(20 MG) BY MOUTH DAILY, Disp: 90 tablet, Rfl: 3   TURMERIC PO, Take 3 tablets by mouth daily. , Disp: , Rfl:    zinc gluconate 50 MG tablet, Take 50 mg by mouth daily., Disp: , Rfl:   EXAM:  VITALS per patient if applicable:  GENERAL: alert, oriented, appears well and in no acute distress  HEENT: atraumatic, conjunttiva clear, no obvious abnormalities on inspection of external nose and ears  NECK: normal movements of the head and neck  LUNGS: on inspection no signs of respiratory distress, breathing rate appears normal, no obvious gross SOB, gasping or wheezing  CV: no obvious cyanosis  MS: moves all visible extremities without noticeable abnormality  PSYCH/NEURO: pleasant and cooperative, no obvious depression or anxiety, speech and thought processing grossly intact  ASSESSMENT AND PLAN:  Discussed the following assessment and plan:  Diarrhea, unspecified type  -we discussed possible serious and likely etiologies, options for evaluation and workup, limitations of telemedicine visit vs in person visit, treatment, treatment risks and precautions. Pt prefers to treat via telemedicine empirically rather than in person at this moment. Query viral gastroenteritis vs other. Discussed possibility of covid and covid testing options. Advised follow up video visit if covid test positive to discuss treatment. Opted for imodium, no dairy or red meat, oral hydration for now. Other symptomatic care measures in patient instructions.  Work/School slipped offered: declined Advised to seek prompt in person care if worsening, new symptoms arise, or if is not improving with treatment. Discussed options for inperson care  if PCP office not available.    I discussed the assessment and treatment plan with the patient. The patient was provided an opportunity to ask questions and all were answered. The patient agreed with the plan and demonstrated an understanding of the instructions.     Lucretia Kern, DO

## 2020-07-17 NOTE — Patient Instructions (Signed)
  HOME CARE TIPS:  -[place] testing information: https://www.[facility]a-powers.org/ OR [phone] Most pharmacies also offer testing and home test kits. If the Covid19 test is positive, please make a prompt follow up visit with your primary care office or with [facility] to discuss treatment options. Treatments for Covid19 are best given early in the course of the illness.   -imodium if needed   -I sent the medication(s) we discussed to your pharmacy: No orders of the defined types were placed in this encounter.    -I sent in the [place] treatment or referral you requested per our discussion. Please see the information provided below and discuss further with the pharmacist/treatment team.   -can use tylenol or aleve if needed for fevers, aches and pains per instructions  -can use nasal saline a few times per day if you have nasal congestion; sometimes  a short course of Afrin nasal spray for 3 days can help with symptoms as well  -stay hydrated, drink plenty of fluids and eat small healthy meals - avoid dairy  -can take 1000 IU (1mcg) Vit D3 and 100-500 mg of Vit C daily per instructions  -If the Covid test is positive, check out the [facility] website for more information on home care, transmission and treatment for COVID19  -follow up with your doctor in 2-3 days unless improving and feeling better  -stay home while sick, except to seek medical care. If you have COVID19, ideally it would be best to stay home for a full 10 days since the onset of symptoms PLUS one day of no fever and feeling better. Wear a good mask that fits snugly (such as N95 or KN95) if around others to reduce the risk of transmission.  It was nice to meet you today, and I really hope you are feeling better soon. I help [facility] out with telemedicine visits on Tuesdays and Thursdays and am available for visits on those days. If you have any concerns or questions following this visit  please schedule a follow up visit with your Primary Care doctor or seek care at a local urgent care clinic to avoid delays in care.    Seek in person care or schedule a follow up video visit promptly if your symptoms worsen, new concerns arise or you are not improving with treatment. Call 911 and/or seek emergency care if your symptoms are severe or life threatening.

## 2020-09-17 ENCOUNTER — Telehealth: Payer: Self-pay | Admitting: Internal Medicine

## 2020-09-17 DIAGNOSIS — M810 Age-related osteoporosis without current pathological fracture: Secondary | ICD-10-CM

## 2020-09-17 NOTE — Telephone Encounter (Signed)
Requesting a dexa scan prior to her appointment on 09/26/20. She would like to discuss the results during her visit.   Please advise.

## 2020-09-17 NOTE — Addendum Note (Signed)
Addended by: Biagio Borg on: 09/17/2020 05:35 PM   Modules accepted: Orders

## 2020-09-17 NOTE — Telephone Encounter (Signed)
Ok this is ordered  Please assist pt with setting up appt at [facility] for this

## 2020-09-18 ENCOUNTER — Encounter: Payer: BC Managed Care – PPO | Admitting: Internal Medicine

## 2020-09-23 NOTE — Telephone Encounter (Signed)
LDVM for pt of DEXA scan appointment. Pts apptmnt is at the [facility] office at 9:30am on 09/26/2020. Pt has a physical with Dr. Jenny Reichmann same day at 8am.

## 2020-09-25 ENCOUNTER — Other Ambulatory Visit (INDEPENDENT_AMBULATORY_CARE_PROVIDER_SITE_OTHER): Payer: 59

## 2020-09-25 DIAGNOSIS — Z0000 Encounter for general adult medical examination without abnormal findings: Secondary | ICD-10-CM | POA: Diagnosis not present

## 2020-09-25 DIAGNOSIS — R739 Hyperglycemia, unspecified: Secondary | ICD-10-CM

## 2020-09-25 DIAGNOSIS — E559 Vitamin D deficiency, unspecified: Secondary | ICD-10-CM | POA: Diagnosis not present

## 2020-09-25 LAB — CBC WITH DIFFERENTIAL/PLATELET
Basophils Absolute: 0 10*3/uL (ref 0.0–0.1)
Basophils Relative: 0.8 % (ref 0.0–3.0)
Eosinophils Absolute: 0.2 10*3/uL (ref 0.0–0.7)
Eosinophils Relative: 3.8 % (ref 0.0–5.0)
HCT: 39.9 % (ref 36.0–46.0)
Hemoglobin: 13.1 g/dL (ref 12.0–15.0)
Lymphocytes Relative: 35.8 % (ref 12.0–46.0)
Lymphs Abs: 1.9 10*3/uL (ref 0.7–4.0)
MCHC: 32.9 g/dL (ref 30.0–36.0)
MCV: 93.1 fl (ref 78.0–100.0)
Monocytes Absolute: 0.6 10*3/uL (ref 0.1–1.0)
Monocytes Relative: 10.8 % (ref 3.0–12.0)
Neutro Abs: 2.6 10*3/uL (ref 1.4–7.7)
Neutrophils Relative %: 48.8 % (ref 43.0–77.0)
Platelets: 225 10*3/uL (ref 150.0–400.0)
RBC: 4.29 Mil/uL (ref 3.87–5.11)
RDW: 13.3 % (ref 11.5–15.5)
WBC: 5.3 10*3/uL (ref 4.0–10.5)

## 2020-09-25 LAB — COMPREHENSIVE METABOLIC PANEL
ALT: 19 U/L (ref 0–35)
AST: 17 U/L (ref 0–37)
Albumin: 4.1 g/dL (ref 3.5–5.2)
Alkaline Phosphatase: 54 U/L (ref 39–117)
BUN: 15 mg/dL (ref 6–23)
CO2: 31 mEq/L (ref 19–32)
Calcium: 9.6 mg/dL (ref 8.4–10.5)
Chloride: 102 mEq/L (ref 96–112)
Creatinine, Ser: 0.84 mg/dL (ref 0.40–1.20)
GFR: 73.54 mL/min (ref >60.00)
Glucose, Bld: 77 mg/dL (ref 70–99)
Potassium: 4.1 mEq/L (ref 3.5–5.1)
Sodium: 138 mEq/L (ref 135–145)
Total Bilirubin: 0.6 mg/dL (ref 0.2–1.2)
Total Protein: 6.7 g/dL (ref 6.0–8.3)

## 2020-09-25 LAB — URINALYSIS, ROUTINE W REFLEX MICROSCOPIC
Bilirubin Urine: NEGATIVE
Hgb urine dipstick: NEGATIVE
Ketones, ur: NEGATIVE
Leukocytes,Ua: NEGATIVE
Nitrite: NEGATIVE
RBC / HPF: NONE SEEN (ref 0–?)
Specific Gravity, Urine: <=1.005 — AB (ref 1.000–1.030)
Total Protein, Urine: NEGATIVE
Urine Glucose: NEGATIVE
Urobilinogen, UA: 0.2 (ref 0.0–1.0)
WBC, UA: NONE SEEN (ref 0–?)
pH: 7 (ref 5.0–8.0)

## 2020-09-25 LAB — LIPID PANEL
Cholesterol: 237 mg/dL — ABNORMAL HIGH (ref 0–200)
HDL: 85.5 mg/dL (ref >39.00)
LDL Cholesterol: 138 mg/dL — ABNORMAL HIGH (ref 0–99)
NonHDL: 151.62
Total CHOL/HDL Ratio: 3
Triglycerides: 67 mg/dL (ref 0.0–149.0)
VLDL: 13.4 mg/dL (ref 0.0–40.0)

## 2020-09-25 LAB — HEMOGLOBIN A1C: Hgb A1c MFr Bld: 5.7 % (ref 4.6–6.5)

## 2020-09-25 LAB — TSH: TSH: 1.61 u[IU]/mL (ref 0.35–5.50)

## 2020-09-25 LAB — VITAMIN D 25 HYDROXY (VIT D DEFICIENCY, FRACTURES): VITD: 46.76 ng/mL (ref 30.00–100.00)

## 2020-09-25 NOTE — Addendum Note (Signed)
Addended by: Octavio Manns E on: 09/25/2020 11:13 AM   Modules accepted: Orders

## 2020-09-25 NOTE — Addendum Note (Signed)
Addended by: Octavio Manns E on: 09/25/2020 11:14 AM   Modules accepted: Orders

## 2020-09-25 NOTE — Addendum Note (Signed)
Addended by: Octavio Manns E on: 09/25/2020 11:15 AM   Modules accepted: Orders

## 2020-09-26 ENCOUNTER — Ambulatory Visit (INDEPENDENT_AMBULATORY_CARE_PROVIDER_SITE_OTHER): Payer: 59 | Admitting: Internal Medicine

## 2020-09-26 ENCOUNTER — Other Ambulatory Visit: Payer: Self-pay

## 2020-09-26 ENCOUNTER — Encounter: Payer: Self-pay | Admitting: Internal Medicine

## 2020-09-26 ENCOUNTER — Ambulatory Visit (INDEPENDENT_AMBULATORY_CARE_PROVIDER_SITE_OTHER)
Admission: RE | Admit: 2020-09-26 | Discharge: 2020-09-26 | Disposition: A | Discharge status: Home / Self Care | Payer: 59 | Source: Ambulatory Visit | Attending: Internal Medicine | Admitting: Internal Medicine

## 2020-09-26 ENCOUNTER — Other Ambulatory Visit (INDEPENDENT_AMBULATORY_CARE_PROVIDER_SITE_OTHER): Payer: 59

## 2020-09-26 VITALS — BP 114/70 | HR 61 | Temp 98.8°F | Ht 66.0 in | Wt 133.0 lb

## 2020-09-26 DIAGNOSIS — E538 Deficiency of other specified B group vitamins: Secondary | ICD-10-CM

## 2020-09-26 DIAGNOSIS — Z0000 Encounter for general adult medical examination without abnormal findings: Secondary | ICD-10-CM

## 2020-09-26 DIAGNOSIS — F411 Generalized anxiety disorder: Secondary | ICD-10-CM

## 2020-09-26 DIAGNOSIS — E559 Vitamin D deficiency, unspecified: Secondary | ICD-10-CM

## 2020-09-26 DIAGNOSIS — M810 Age-related osteoporosis without current pathological fracture: Secondary | ICD-10-CM

## 2020-09-26 DIAGNOSIS — B0189 Other varicella complications: Secondary | ICD-10-CM

## 2020-09-26 DIAGNOSIS — Z0001 Encounter for general adult medical examination with abnormal findings: Secondary | ICD-10-CM

## 2020-09-26 DIAGNOSIS — E7800 Pure hypercholesterolemia, unspecified: Secondary | ICD-10-CM

## 2020-09-26 LAB — VITAMIN B12: Vitamin B-12: 702 pg/mL (ref 211–911)

## 2020-09-26 NOTE — Patient Instructions (Signed)
Ok to see [place] at the front desk for the [place] to stay off the crestor  Please continue all other medications as before, and refills have been done if requested.  Please have the pharmacy call with any other refills you may need.  Please continue your efforts at being more active, low cholesterol diet, and weight control.  You are otherwise up to date with prevention measures today.  Please keep your appointments with your specialists as you may have planned  Please go to the LAB at the blood drawing area for the tests to be done  You will be contacted by phone if any changes need to be made immediately.  Otherwise, you will receive a letter about your results with an explanation, but please check with MyChart first.  Please remember to sign up for MyChart if you have not done so, as this will be important to you in the future with finding out test results, communicating by private email, and scheduling acute appointments online when needed.  Please make an Appointment to return for your 1 year visit, or sooner if needed, with Lab testing by Appointment as well, to be done about 3-5 days before at the [facility] (so this is for TWO appointments - please see the scheduling desk as you leave)   Due to the ongoing Covid 19 pandemic, our lab now requires an appointment for any labs done at our office.  If you need labs done and do not have an appointment, please call our office ahead of time to schedule before presenting to the lab for your testing.

## 2020-09-26 NOTE — Progress Notes (Signed)
Patient ID: Shelby Grant, female   DOB: May 09, 1955, 65 y.o.   MRN: [id number]         Chief Complaint:: wellness exam and osteoporosis, hld, anxiety       HPI:  Shelby Grant is a 65 y.o. female here for wellness exam  Also no hx of covid infection. Has mammogram sche and dxa sched later today.  Pt will call for GYN. Pt wondering if had chickenpox in the psat and if she needs shingrix.  Decliens covid booster, shingrix, flu shot, pneuomovax for now.  O.w up to date with preventive referrals and immunizations  [place] father has dementia in independent living but requires 24/7 family staying with him, more stress for her lately having to participate in the rounds of taking care of him.  Denies worsening depressive symptoms, suicidal ideation, or panic; has ongoing anxiety.  Has been not taking crestor recently and wndering if needs to restart  Pt denies chest pain, increased sob or doe, wheezing, orthopnea, PND, increased LE swelling, palpitations, dizziness or syncope.   Pt denies polydipsia, polyuria, or new focal neuro s/s.     Wt Readings from Last 3 Encounters:  09/26/20 133 lb (60.3 kg)  09/18/19 136 lb 9.6 oz (62 kg)  07/06/19 132 lb (59.9 kg)   BP Readings from Last 3 Encounters:  09/26/20 114/70  09/18/19 110/70  07/06/19 134/70   Immunization History  Administered Date(s) Administered   Influenza Whole 11/07/2007, 12/02/2008, 10/29/2009, 11/10/2011   Influenza, Seasonal, Injecte, Preservative Fre 11/25/2016   Influenza,inj,Quad PF,6+ Mos 10/10/2012, 10/15/2014, 11/04/2017, 12/15/2018   Influenza,inj,quad, With Preservative 12/02/2016   Influenza-Unspecified 11/02/2013, 11/02/2014, 09/13/2015, 10/03/2015   PFIZER(Purple Top)SARS-COV-2 Vaccination 04/27/2019, 05/22/2019   Td 11/03/1998, 06/03/2008   Tdap 08/11/2018   Zoster, Live 09/13/2015   There are no preventive care reminders to display for this patient.     Past Medical History:  Diagnosis Date   ALLERGIC RHINITIS  07/03/2008   Allergy    seasonal   ANXIETY 06/02/2007   Arthritis    BELL'S PALSY, LEFT 12/02/2008   Cancer ([place]) 01/16/2015   kidney   CELLULITIS, LEFT LEG 09/14/2008   CHEST PAIN 123456   Complication of anesthesia 08-28-2014   "work up in recovery room with jaw locked open"   CORNS AND CALLUSES 07/03/2008   Depression    FOOT PAIN 09/22/2009   History of kidney cancer    IRREGULAR MENSES 06/03/2008   Neoplasm    left renal   OCD (obsessive compulsive disorder)    Renal disorder    Stress fracture of ankle since Jun 29, 2014   left   Supraventricular tachycardia ([employer]) 06/02/2007   Atrial tachycardia with Wenckebach periodicity   Past Surgical History:  Procedure Laterality Date   APPENDECTOMY  20016   BLEPHAROPLASTY Bilateral 10/31/2017   COLONOSCOPY  2009   HIP ARTHROPLASTY Right 11/18/2017   LAPAROSCOPIC APPENDECTOMY N/A 08/28/2014   Procedure: APPENDECTOMY LAPAROSCOPIC;  Surgeon: Judeth Horn, MD;  Location: [facility];  Service: General;  Laterality: N/A;   ROBOTIC ASSITED PARTIAL NEPHRECTOMY Left 01/06/2015   Procedure: ROBOTIC ASSISTED PARTIAL NEPHRECTOMY;  Surgeon: Raynelle Bring, MD;  Location: [facility];  Service: Urology;  Laterality: Left;   WISDOM TOOTH EXTRACTION      reports that she has never smoked. She has never used smokeless tobacco. She reports current alcohol use of about 2.0 standard drinks per week. She reports that she does not use drugs. family history includes Asthma in her sister;  Cancer in her mother; Colon polyps in her sister. Allergies  Allergen Reactions   Latex     REACTION: rash   Macrobid [Nitrofurantoin Macrocrystal] Swelling and Other (See Comments)    Lips and inside of mouth swelling   Amoxicillin Rash    Has patient had a PCN reaction causing immediate rash, facial/tongue/throat swelling, SOB or lightheadedness with hypotension: unkown Has patient had a PCN reaction causing severe rash involving mucus membranes or skin necrosis: no Has patient had a  PCN reaction that required hospitalization No Has patient had a PCN reaction occurring within the last 10 years: unknown If all of the above answers are "NO", then may proceed with Cephalosporin use.   Current Outpatient Medications on File Prior to Visit  Medication Sig Dispense Refill   Acetaminophen (TYLENOL PO) Take 1,000 mg by mouth daily as needed.      ALFALFA PO Take by mouth.     b complex vitamins tablet Take 1 tablet by mouth daily.     Calcium-Magnesium-Vitamin D (CALCIUM MAGNESIUM PO) Take 4 tablets by mouth daily.      co-enzyme Q-10 30 MG capsule Take 30 mg by mouth 3 (three) times daily.     cyanocobalamin 100 MCG tablet Take 100 mcg by mouth daily.     ibuprofen (ADVIL) 200 MG tablet Take 200 mg by mouth every 6 (six) hours as needed.     Multiple Vitamin (MULTIVITAMIN) capsule Take 1 capsule by mouth daily.     Probiotic Product (ALIGN) 4 MG CAPS Take 1 capsule by mouth daily.      zinc gluconate 50 MG tablet Take 50 mg by mouth daily.     denosumab (PROLIA) 60 MG/ML SOSY injection INJECT 60 MG INTO THE SKIN EVERY 6 MONTHS (Patient not taking: Reported on 09/26/2020) 1 mL 1   TURMERIC PO Take 3 tablets by mouth daily.  (Patient not taking: Reported on 09/26/2020)     [DISCONTINUED] estrogen, conjugated,-medroxyprogesterone (PREMPRO) 0.625-2.5 MG per tablet Take 1 tablet by mouth daily. 28 tablet 11   No current facility-administered medications on file prior to visit.        ROS:  All others reviewed and negative.  Objective        PE:  BP 114/70 (BP Location: Right Arm, Patient Position: Sitting, Cuff Size: Normal)   Pulse 61   Temp 98.8 F (37.1 C) (Oral)   Ht '5\' 6"'$  (1.676 m)   Wt 133 lb (60.3 kg)   LMP 08/01/2012   SpO2 97%   BMI 21.47 kg/m                 Constitutional: Pt appears in NAD               HENT: Head: NCAT.                Right Ear: External ear normal.                 Left Ear: External ear normal.                Eyes: . Pupils are equal,  round, and reactive to light. Conjunctivae and EOM are normal               Nose: without d/c or deformity               Neck: Neck supple. Gross normal ROM  Cardiovascular: Normal rate and regular rhythm.                 Pulmonary/Chest: Effort normal and breath sounds without rales or wheezing.                Abd:  Soft, NT, ND, + BS, no organomegaly               Neurological: Pt is alert. At baseline orientation, motor grossly intact               Skin: Skin is warm. No rashes, no other new lesions, LE edema - none               Psychiatric: Pt behavior is normal without agitation , mod nervous  Micro: none  Cardiac tracings I have personally interpreted today:  none  Pertinent Radiological findings (summarize): none   Lab Results  Component Value Date   WBC 5.3 09/25/2020   HGB 13.1 09/25/2020   HCT 39.9 09/25/2020   PLT 225.0 09/25/2020   GLUCOSE 77 09/25/2020   CHOL 237 (H) 09/25/2020   TRIG 67.0 09/25/2020   HDL 85.50 09/25/2020   LDLDIRECT 137.5 10/06/2012   LDLCALC 138 (H) 09/25/2020   ALT 19 09/25/2020   AST 17 09/25/2020   NA 138 09/25/2020   K 4.1 09/25/2020   CL 102 09/25/2020   CREATININE 0.84 09/25/2020   BUN 15 09/25/2020   CO2 31 09/25/2020   TSH 1.61 09/25/2020   HGBA1C 5.7 09/25/2020   Assessment/Plan:  Shelby Grant is a 65 y.o. White or Caucasian [1] female with  has a past medical history of ALLERGIC RHINITIS (07/03/2008), Allergy, ANXIETY (06/02/2007), Arthritis, BELL'S PALSY, LEFT (12/02/2008), Cancer ([place]) (01/16/2015), CELLULITIS, LEFT LEG (09/14/2008), CHEST PAIN (123456), Complication of anesthesia (08-28-2014), CORNS AND CALLUSES (07/03/2008), Depression, FOOT PAIN (09/22/2009), History of kidney cancer, IRREGULAR MENSES (06/03/2008), Neoplasm, OCD (obsessive compulsive disorder), Renal disorder, Stress fracture of ankle (since Jun 29, 2014), and Supraventricular tachycardia ([place]) (06/02/2007).  Encounter for well adult exam with abnormal  findings Age and sex appropriate education and counseling updated with regular exercise and diet Referrals for preventative services  - for mammogram, varicella titer Immunizations addressed - declines covid booster, flu shot, pneumovax Smoking counseling  - none needed Evidence for depression or other mood disorder - none significant Most recent labs reviewed. I have personally reviewed and have noted: 1) the patient's medical and social history 2) The patient's current medications and supplements 3) The patient's height, weight, and BMI have been recorded in the chart   Osteoporosis Pt qualifies for prolia - will refer to office liason to get started, and dxa later today  HLD (hyperlipidemia) Recent Ct cardiac score is ZERO, ok to d/c crestor, for lower chol diet  Generalized anxiety disorder Chronic persistent stable, declines need for change in tx at this time,  to f/u any worsening symptoms or concerns  Followup: Return in about 1 year (around 09/26/2021).  Cathlean Cower, MD 09/30/2020 8:38 PM [facility]

## 2020-09-27 ENCOUNTER — Encounter: Payer: Self-pay | Admitting: Internal Medicine

## 2020-09-29 ENCOUNTER — Encounter: Payer: Self-pay | Admitting: Internal Medicine

## 2020-09-29 LAB — VARICELLA ZOSTER ANTIBODY, IGG: Varicella IgG: 703.1 index

## 2020-09-30 ENCOUNTER — Encounter: Payer: Self-pay | Admitting: Internal Medicine

## 2020-09-30 MED ORDER — BUPROPION HCL ER (XL) 300 MG PO TB24: 300.0000 mg | ORAL_TABLET | Freq: Every day | ORAL | 3 refills | Status: DC

## 2020-09-30 MED ORDER — METOPROLOL SUCCINATE ER 25 MG PO TB24: 12.5000 mg | ORAL_TABLET | Freq: Two times a day (BID) | ORAL | 3 refills | Status: DC

## 2020-09-30 MED ORDER — CLONAZEPAM 1 MG PO TABS: ORAL_TABLET | ORAL | 2 refills | Status: DC

## 2020-09-30 NOTE — Assessment & Plan Note (Signed)
Age and sex appropriate education and counseling updated with regular exercise and diet Referrals for preventative services  - for mammogram, varicella titer Immunizations addressed - declines covid booster, flu shot, pneumovax Smoking counseling  - none needed Evidence for depression or other mood disorder - none significant Most recent labs reviewed. I have personally reviewed and have noted: 1) the patient's medical and social history 2) The patient's current medications and supplements 3) The patient's height, weight, and BMI have been recorded in the chart

## 2020-09-30 NOTE — Assessment & Plan Note (Signed)
Recent Ct cardiac score is ZERO, ok to d/c crestor, for lower chol diet

## 2020-09-30 NOTE — Assessment & Plan Note (Signed)
Pt qualifies for prolia - will refer to office liason to get started, and dxa later today

## 2020-09-30 NOTE — Assessment & Plan Note (Signed)
Chronic persistent stable, declines need for change in tx at this time,  to f/u any worsening symptoms or concerns

## 2020-10-02 ENCOUNTER — Encounter: Payer: 59 | Admitting: Internal Medicine

## 2020-11-21 ENCOUNTER — Telehealth: Payer: Self-pay

## 2020-11-21 NOTE — Telephone Encounter (Signed)
Shelby Borg, MD to Shelby Grant     8:21 AM The test results show that your current treatment is OK, except the DXA does qualify as osteoporosis, as the hip is no change, but the lumbar spine is mildly worse compare to previous.  I will ask Larey Seat from our office to contact you regarding starting the Prolia      There is no other need for change of treatment or further evaluation based on these results, at this time.  thanks

## 2020-11-22 NOTE — Telephone Encounter (Signed)
Prolia VOB initiated via [email or web address]  Last OV:  Next OV:  Last Prolia inj:  Next Prolia inj DUE:   

## 2020-12-28 NOTE — Telephone Encounter (Signed)
Prior Auth required for Prolia  PA PROCESS DETAILS: PA is required and is currently not on file. Please provide clinical notes, treatment regimen, patient name & policy # on the request and fax to [facility] at [phone]

## 2021-01-08 NOTE — Telephone Encounter (Signed)
Prior Auth initiated via [email or web address] KEY: [id number]

## 2021-01-12 NOTE — Telephone Encounter (Signed)
Appeal submitted via [email or web address]     Sent to [facility] December 8 This request has not been approved. Based on the information submitted for review, you did not meet our guideline rules for the requested drug. In order for your request to be approved, your provider would need to show that you have met the guideline rules below. The details below are written in medical language. If you have questions, please contact your provider. In some cases, the requested medication or alternatives offered may have additional approval requirements. Our guideline named DENOSUMAB (Prolia) requires the following rule(s) be met for approval: A) You meet ONE of the following: 1) You had a documented trial and therapeutic failure (drug did not work) to a minimum (12) month trial with bisphosphonates (oral or IV medications used for osteoporosis) such as alendronate, risedronate, ibandronate, or zoledronic acid. 2) You have a documented contraindication (harmful for) or intolerance (side effect) to BOTH oral bisphosphonates AND intravenous (IV) bisphosphonates such as alendronate, risedronate, ibandronate, or zoledronic acid.Your doctor told us you have age-related osteoporosis (a type of joint condition). However, your doctor told us you have not had a documented trial and therapeutic failure or documented contraindication or intolerance to bisphosphonates as described above. This is why your request is denied. Please work with your doctor to use a different medication or get [place] more information if it will allow [place] to approve this request. A written notification letter will follow with additional details.

## 2021-01-20 MED ORDER — ALENDRONATE SODIUM 70 MG PO TABS: 70.0000 mg | ORAL_TABLET | ORAL | 3 refills | Status: DC

## 2021-01-20 NOTE — Telephone Encounter (Addendum)
Dr. Jenny Reichmann,  Insurance requires use of Bisphosphonate before considering coverage for Prolia.  Exception: contraindication or intolerance to Bisphosphonate.

## 2021-01-20 NOTE — Telephone Encounter (Signed)
Ok to let pt know about prolia not approved  Ok for fosamax 70 qwk  - done erx

## 2021-01-20 NOTE — Addendum Note (Signed)
Addended by: Biagio Borg on: 01/20/2021 07:21 PM   Modules accepted: Orders

## 2021-01-21 NOTE — Telephone Encounter (Signed)
Patient states that she will be covered by [place] starting in January and will give [place] a call with new insurance info to see if perhaps prolia will be approved through them.

## 2021-04-02 ENCOUNTER — Ambulatory Visit
Admission: EM | Admit: 2021-04-02 | Discharge: 2021-04-02 | Disposition: A | Discharge status: Home / Self Care | Payer: Managed Care, Other (non HMO) | Attending: Emergency Medicine | Admitting: Emergency Medicine

## 2021-04-02 ENCOUNTER — Encounter: Payer: Self-pay | Admitting: Emergency Medicine

## 2021-04-02 ENCOUNTER — Other Ambulatory Visit: Payer: Self-pay

## 2021-04-02 DIAGNOSIS — J358 Other chronic diseases of tonsils and adenoids: Secondary | ICD-10-CM

## 2021-04-02 DIAGNOSIS — H66001 Acute suppurative otitis media without spontaneous rupture of ear drum, right ear: Secondary | ICD-10-CM

## 2021-04-02 DIAGNOSIS — J029 Acute pharyngitis, unspecified: Secondary | ICD-10-CM | POA: Diagnosis not present

## 2021-04-02 DIAGNOSIS — J020 Streptococcal pharyngitis: Secondary | ICD-10-CM | POA: Diagnosis not present

## 2021-04-02 LAB — POCT RAPID STREP A (OFFICE): Rapid Strep A Screen: POSITIVE — AB

## 2021-04-02 MED ORDER — CEFDINIR 300 MG PO CAPS: 300.0000 mg | ORAL_CAPSULE | Freq: Two times a day (BID) | ORAL | 0 refills | Status: AC

## 2021-04-02 NOTE — ED Triage Notes (Signed)
Pt reports started feeling bad on Tuesday and symptoms got worse yesterday. Having cough, fevers, headache, stopped up nose, ear pains, and sinus pressure.  ?

## 2021-04-02 NOTE — Discharge Instructions (Addendum)
Your rapid strep test today is positive.  Please begin cefdinir now.  Fortunately, cefdinir also treats ear infections so no need for second antibiotic.  A prescription has been sent to your pharmacy.  Please take all doses as prescribed.  After taking antibiotics for 24 hours, you are no longer considered contagious and should begin to feel much better.   ? ?It is important that you finish all antibiotics exactly as prescribed.  Failure to do so can result in worsening infection that may require hospitalization and IV antibiotics.  Please follow-up with your primary care provider in the next week to ten days for repeat evaluation if you have not had complete resolution of your symptoms. ?  ?Please see the list below for recommended medications, dosages and frequencies to provide relief of your current symptoms:   ?  ?Cefdinir (Omnicef):  1 capsule twice daily for 10 days, you can take it with or without food.  This antibiotic can cause upset stomach, this will resolve once antibiotics are complete.  You are welcome to use a probiotic, eat yogurt, take Imodium while taking this medication.  Please avoid other systemic medications such as Maalox, Pepto-Bismol or milk of magnesia as they can interfere with your body's ability to absorb the antibiotics. ?  ?Ibuprofen  (Advil, Motrin): This is a good anti-inflammatory medication which addresses aches, pains, fever, sinus pressure, ear pain and inflammation of the upper airways that causes sinus and nasal congestion as well as inflammation in the lower airways which can make your cough feel tight and sometimes burn.  I recommend that you take between 400 to 600 mg every 6-8 hours as needed.    ?  ?Chloraseptic Throat Spray: Spray 5 sprays into affected area every 2 hours, hold for 15 seconds and either swallow or spit it out.  This is a excellent numbing medication because it is a spray, you can put it right where you needed and so sucking on a lozenge and numbing your  entire mouth.    ?  ?Conservative care is also recommended at this time.  This includes rest, pushing clear fluids and activity as tolerated.  Warm beverages such as teas and broths versus cold beverages/popsicles and frozen sherbet/sorbet are your choice, both warm and cold are beneficial.  You may also notice that your appetite is reduced; this is okay as long as you are drinking plenty of clear fluids.  ?  ?Thank you for visiting urgent care today.  We appreciate the opportunity to participate in your care. ? ?

## 2021-04-03 ENCOUNTER — Ambulatory Visit: Payer: Self-pay | Admitting: Nurse Practitioner

## 2021-04-03 NOTE — ED Provider Notes (Signed)
[facility]    CSN: [id number] Arrival date & time: 04/02/21  1519    HISTORY   Chief Complaint  Patient presents with   Sore   Cough   Nasal Congestion   Headache   HPI Shelby Grant is a 66 y.o. female. Pt reports started feeling bad on Tuesday and symptoms got worse yesterday. Having cough, fevers, headache, stopped up nose, ear pains, and sinus pressure.  Patient denies known sick contacts.  Patient states she not tried any over-the-counter medications for symptoms.  Patient states that overall, she has continued to feel worse in the past few days.  The history is provided by the patient.  Past Medical History:  Diagnosis Date   ALLERGIC RHINITIS 07/03/2008   Allergy    seasonal   ANXIETY 06/02/2007   Arthritis    BELL'S PALSY, LEFT 12/02/2008   Cancer ([place]) 01/16/2015   kidney   CELLULITIS, LEFT LEG 09/14/2008   CHEST PAIN 07/09/1273   Complication of anesthesia 08-28-2014   "work up in recovery room with jaw locked open"   CORNS AND CALLUSES 07/03/2008   Depression    FOOT PAIN 09/22/2009   History of kidney cancer    IRREGULAR MENSES 06/03/2008   Neoplasm    left renal   OCD (obsessive compulsive disorder)    Renal disorder    Stress fracture of ankle since Jun 29, 2014   left   Supraventricular tachycardia ([place]) 06/02/2007   Atrial tachycardia with Wenckebach periodicity   Patient Active Problem List   Diagnosis Date Noted   Osteoporosis 09/22/2019   Loss of transverse plantar arch 12/04/2018   Renal cell cancer ([place]) 08/11/2018   Refusal of blood transfusions as patient is Jehovah's Witness 08/11/2018   HLD (hyperlipidemia) 08/11/2018   Encounter for screening for cervical cancer 03/29/2017   Encounter for well adult exam with abnormal findings 03/29/2017   Colon cancer screening 12/04/2015   Menopause syndrome 02/11/2011   ALLERGIC RHINITIS 07/03/2008   Supraventricular tachycardia ([place]) 06/02/2007   Generalized anxiety disorder 06/02/2007   Past  Surgical History:  Procedure Laterality Date   APPENDECTOMY  20016   BLEPHAROPLASTY Bilateral 10/31/2017   COLONOSCOPY  2009   HIP ARTHROPLASTY Right 11/18/2017   LAPAROSCOPIC APPENDECTOMY N/A 08/28/2014   Procedure: APPENDECTOMY LAPAROSCOPIC;  Surgeon: Judeth Horn, MD;  Location: [place];  Service: General;  Laterality: N/A;   ROBOTIC ASSITED PARTIAL NEPHRECTOMY Left 01/06/2015   Procedure: ROBOTIC ASSISTED PARTIAL NEPHRECTOMY;  Surgeon: Raynelle Bring, MD;  Location: [facility];  Service: Urology;  Laterality: Left;   WISDOM TOOTH EXTRACTION     OB History   No obstetric history on file.    Home Medications    Prior to Admission medications   Medication Sig Start Date End Date Taking? Authorizing Provider  cefdinir (OMNICEF) 300 MG capsule Take 1 capsule (300 mg total) by mouth 2 (two) times daily for 10 days. 04/02/21 04/12/21 Yes Lynden Oxford Scales, PA-C  Acetaminophen (TYLENOL PO) Take 1,000 mg by mouth daily as needed.     [provider]  alendronate (FOSAMAX) 70 MG tablet Take 1 tablet (70 mg total) by mouth every 7 (seven) days. Take with a full glass of water on an empty stomach. 01/20/21   Biagio Borg, MD  ALFALFA PO Take by mouth.    [provider]  b complex vitamins tablet Take 1 tablet by mouth daily.    [provider]  buPROPion (WELLBUTRIN XL) 300 MG 24  hr tablet Take 1 tablet (300 mg total) by mouth daily. 09/30/20   Biagio Borg, MD  Calcium-Magnesium-Vitamin D (CALCIUM MAGNESIUM PO) Take 4 tablets by mouth daily.     [provider]  clonazePAM Bobbye [place]) 1 MG tablet 1 tab by mouth once daily as needed 09/30/20   Biagio Borg, MD  co-enzyme Q-10 30 MG capsule Take 30 mg by mouth 3 (three) times daily.    [provider]  cyanocobalamin 100 MCG tablet Take 100 mcg by mouth daily.    [provider]  ibuprofen (ADVIL) 200 MG tablet Take 200 mg by mouth every 6 (six) hours as needed.    [provider]   metoprolol succinate (TOPROL-XL) 25 MG 24 hr tablet Take 0.5 tablets (12.5 mg total) by mouth 2 (two) times daily. 09/30/20   Biagio Borg, MD  Multiple Vitamin (MULTIVITAMIN) capsule Take 1 capsule by mouth daily.    [provider]  Probiotic Product (ALIGN) 4 MG CAPS Take 1 capsule by mouth daily.     [provider]  TURMERIC PO Take 3 tablets by mouth daily.  Patient not taking: Reported on 09/26/2020    [provider]  zinc gluconate 50 MG tablet Take 50 mg by mouth daily.    [provider]  estrogen, conjugated,-medroxyprogesterone (PREMPRO) 0.625-2.5 MG per tablet Take 1 tablet by mouth daily. 02/11/11 03/09/17  Dorena Cookey, MD   Family History Family History  Problem Relation Age of Onset   Asthma Sister    Colon polyps Sister    Cancer Mother        [facility] where cancer started    Colon cancer Neg Hx    Esophageal cancer Neg Hx    Rectal cancer Neg Hx    Stomach cancer Neg Hx    Social History Social History   Tobacco Use   Smoking status: Never   Smokeless tobacco: Never  Vaping Use   Vaping Use: Never used  Substance Use Topics   Alcohol use: Yes    Alcohol/week: 2.0 standard drinks    Types: 1 Glasses of wine, 1 Shots of liquor per week    Comment: social    Drug use: No   Allergies   Latex, Macrobid [nitrofurantoin macrocrystal], and Amoxicillin  Review of Systems Review of Systems Pertinent findings noted in history of present illness.   Physical Exam Triage Vital Signs ED Triage Vitals  Enc Vitals Group     BP 11/28/20 0827 (!) 147/82     Pulse Rate 11/28/20 0827 72     Resp 11/28/20 0827 18     Temp 11/28/20 0827 98.3 F (36.8 C)     Temp Source 11/28/20 0827 Oral     SpO2 11/28/20 0827 98 %     Weight --      Height --      Head Circumference --      Peak Flow --      Pain Score 11/28/20 0826 5     Pain Loc --      Pain Edu? --      Excl. in [place]? --   No data found.  Updated Vital Signs BP 125/81  (BP Location: Right Arm)    Pulse 77    Temp 98.8 F (37.1 C) (Oral)    Resp 18    LMP 08/01/2012    SpO2 97%   Physical Exam Constitutional:      General: She is not  in acute distress.    Appearance: She is well-developed. She is ill-appearing. She is not toxic-appearing.  HENT:     Head: Normocephalic and atraumatic.     Salivary Glands: Right salivary gland is diffusely enlarged and tender. Left salivary gland is diffusely enlarged and tender.     Right Ear: Hearing and external ear normal.     Left Ear: Hearing, tympanic membrane, ear canal and external ear normal.     Ears:     Comments: Right EAC with diffuse erythema, right TM slightly bulging with suppurative fluid.  Left EAC and TM are normal.    Nose: No mucosal edema, congestion or rhinorrhea.     Right Turbinates: Not enlarged, swollen or pale.     Left Turbinates: Not enlarged or swollen.     Right Sinus: No maxillary sinus tenderness or frontal sinus tenderness.     Left Sinus: No maxillary sinus tenderness or frontal sinus tenderness.     Mouth/Throat:     Lips: Pink. No lesions.     Mouth: Mucous membranes are moist. No oral lesions or angioedema.     Dentition: No gingival swelling.     Tongue: No lesions.     Palate: No mass.     Pharynx: Uvula midline. Pharyngeal swelling, oropharyngeal exudate and posterior oropharyngeal erythema present. No uvula swelling.     Tonsils: Tonsillar exudate present. 2+ on the right. 2+ on the left.  Eyes:     Extraocular Movements: Extraocular movements intact.     Conjunctiva/sclera: Conjunctivae normal.     Pupils: Pupils are equal, round, and reactive to light.  Neck:     Thyroid: No thyroid mass, thyromegaly or thyroid tenderness.     Trachea: Tracheal tenderness present. No abnormal tracheal secretions or tracheal deviation.     Comments: Voice is muffled Cardiovascular:     Rate and Rhythm: Normal rate and regular rhythm.     Pulses: Normal pulses.     Heart sounds: Normal  heart sounds, S1 normal and S2 normal. No murmur heard.   No friction rub. No gallop.  Pulmonary:     Effort: Pulmonary effort is normal. No accessory muscle usage, prolonged expiration, respiratory distress or retractions.     Breath sounds: No stridor, decreased air movement or transmitted upper airway sounds. No decreased breath sounds, wheezing, rhonchi or rales.  Abdominal:     General: Bowel sounds are normal.     Palpations: Abdomen is soft.     Tenderness: There is generalized abdominal tenderness. There is no right CVA tenderness, left CVA tenderness or rebound. Negative signs include Murphy's sign.     Hernia: No hernia is present.  Musculoskeletal:        General: No tenderness. Normal range of motion.     Cervical back: Full passive range of motion without pain, normal range of motion and neck supple.     Right lower leg: No edema.     Left lower leg: No edema.  Lymphadenopathy:     Cervical: Cervical adenopathy present.     Right cervical: Superficial cervical adenopathy present.     Left cervical: Superficial cervical adenopathy present.  Skin:    General: Skin is warm and dry.     Findings: No erythema, lesion or rash.  Neurological:     General: No focal deficit present.     Mental Status: She is alert and oriented to person, place, and time. Mental status is at baseline.  Psychiatric:  Mood and Affect: Mood normal.        Behavior: Behavior normal.        Thought Content: Thought content normal.        Judgment: Judgment normal.    Visual Acuity Right Eye Distance:   Left Eye Distance:   Bilateral Distance:    Right Eye Near:   Left Eye Near:    Bilateral Near:     [facility] Couse / Diagnostics / Procedures:    EKG  Radiology No results found.  Procedures Procedures (including critical care time)  UC Diagnoses / Final Clinical Impressions(s)   I have reviewed the triage vital signs and the nursing notes.  Pertinent labs & imaging results that were  available during my care of the patient were reviewed by me and considered in my medical decision making (see chart for details).   Final diagnoses:  Streptococcal pharyngitis  Acute pharyngitis, unspecified etiology  Tonsillar exudate  Acute suppurative otitis media of right ear   Rapid strep test is positive, patient also has infection in her right inner ear.  We will treat with cefdinir.  Return precautions advised..  ED Prescriptions     Medication Sig Dispense Auth. Provider   cefdinir (OMNICEF) 300 MG capsule Take 1 capsule (300 mg total) by mouth 2 (two) times daily for 10 days. 20 capsule Lynden Oxford Scales, PA-C      PDMP not reviewed this encounter.  Pending results:  Labs Reviewed  POCT RAPID STREP A (OFFICE) - Abnormal; Notable for the following components:      Result Value   Rapid Strep A Screen Positive (*)    All other components within normal limits  CULTURE, GROUP A STREP [facility])    Medications Ordered in UC: Medications - No data to display  Disposition Upon Discharge:  Condition: stable for discharge home Home: take medications as prescribed; routine discharge instructions as discussed; follow up as advised.  Patient presented with an acute illness with associated systemic symptoms and significant discomfort requiring urgent management. In my opinion, this is a condition that a prudent lay person (someone who possesses an average knowledge of health and medicine) may potentially expect to result in complications if not addressed urgently such as respiratory distress, impairment of bodily function or dysfunction of bodily organs.   Routine symptom specific, illness specific and/or disease specific instructions were discussed with the patient and/or caregiver at length.   As such, the patient has been evaluated and assessed, work-up was performed and treatment was provided in alignment with urgent care protocols and evidence based medicine.   Patient/parent/caregiver has been advised that the patient may require follow up for further testing and treatment if the symptoms continue in spite of treatment, as clinically indicated and appropriate.  If the patient was tested for COVID-19, Influenza and/or RSV, then the patient/parent/guardian was advised to isolate at home pending the results of his/her diagnostic coronavirus test and potentially longer if theyre positive. I have also advised pt that if his/her COVID-19 test returns positive, it's recommended to self-isolate for at least 10 days after symptoms first appeared AND until fever-free for 24 hours without fever reducer AND other symptoms have improved or resolved. Discussed self-isolation recommendations as well as instructions for household member/close contacts as per the [facility] and [place] [facility], and also gave patient the [place] packet with this information.  Patient/parent/caregiver has been advised to return to the [facility] or PCP in 3-5 days if no better; to PCP or the Emergency  Department if new signs and symptoms develop, or if the current signs or symptoms continue to change or worsen for further workup, evaluation and treatment as clinically indicated and appropriate  The patient will follow up with their current PCP if and as advised. If the patient does not currently have a PCP we will assist them in obtaining one.   The patient may need specialty follow up if the symptoms continue, in spite of conservative treatment and management, for further workup, evaluation, consultation and treatment as clinically indicated and appropriate.  Patient/parent/caregiver verbalized understanding and agreement of plan as discussed.  All questions were addressed during visit.  Please see discharge instructions below for further details of plan.  Discharge Instructions:   Discharge Instructions      Your rapid strep test today is positive.  Please begin cefdinir now.  Fortunately, cefdinir also treats  ear infections so no need for second antibiotic.  A prescription has been sent to your pharmacy.  Please take all doses as prescribed.  After taking antibiotics for 24 hours, you are no longer considered contagious and should begin to feel much better.    It is important that you finish all antibiotics exactly as prescribed.  Failure to do so can result in worsening infection that may require hospitalization and IV antibiotics.  Please follow-up with your primary care provider in the next week to ten days for repeat evaluation if you have not had complete resolution of your symptoms.   Please see the list below for recommended medications, dosages and frequencies to provide relief of your current symptoms:     Cefdinir (Omnicef):  1 capsule twice daily for 10 days, you can take it with or without food.  This antibiotic can cause upset stomach, this will resolve once antibiotics are complete.  You are welcome to use a probiotic, eat yogurt, take Imodium while taking this medication.  Please avoid other systemic medications such as Maalox, Pepto-Bismol or milk of magnesia as they can interfere with your body's ability to absorb the antibiotics.   Ibuprofen  (Advil, Motrin): This is a good anti-inflammatory medication which addresses aches, pains, fever, sinus pressure, ear pain and inflammation of the upper airways that causes sinus and nasal congestion as well as inflammation in the lower airways which can make your cough feel tight and sometimes burn.  I recommend that you take between 400 to 600 mg every 6-8 hours as needed.      Chloraseptic Throat Spray: Spray 5 sprays into affected area every 2 hours, hold for 15 seconds and either swallow or spit it out.  This is a excellent numbing medication because it is a spray, you can put it right where you needed and so sucking on a lozenge and numbing your entire mouth.      Conservative care is also recommended at this time.  This includes rest, pushing  clear fluids and activity as tolerated.  Warm beverages such as teas and broths versus cold beverages/popsicles and frozen sherbet/sorbet are your choice, both warm and cold are beneficial.  You may also notice that your appetite is reduced; this is okay as long as you are drinking plenty of clear fluids.    Thank you for visiting urgent care today.  We appreciate the opportunity to participate in your care.     This office note has been dictated using Museum/gallery curator.  Unfortunately, and despite my best efforts, this method of dictation can sometimes lead to occasional typographical  or grammatical errors.  I apologize in advance if this occurs.     Lynden Oxford Scales, [place] 04/03/21 [phone]

## 2021-05-15 ENCOUNTER — Telehealth: Payer: Self-pay | Admitting: Internal Medicine

## 2021-05-15 NOTE — Telephone Encounter (Signed)
Patient notified labs are already ordered. ?

## 2021-05-15 NOTE — Telephone Encounter (Signed)
Pt requesting an order for labs prior to cpe appt on 09-29-2021 ?

## 2021-07-07 ENCOUNTER — Telehealth: Payer: Self-pay | Admitting: Internal Medicine

## 2021-07-07 MED ORDER — CLONAZEPAM 1 MG PO TABS: ORAL_TABLET | ORAL | 2 refills | Status: DC

## 2021-07-07 NOTE — Telephone Encounter (Signed)
Done erx 

## 2021-07-07 NOTE — Telephone Encounter (Signed)
Patient is requesting a refill on their clonazapam - please send to [facility] on [address]

## 2021-07-09 ENCOUNTER — Encounter: Payer: Self-pay | Admitting: Internal Medicine

## 2021-07-09 ENCOUNTER — Other Ambulatory Visit: Payer: Self-pay | Admitting: Internal Medicine

## 2021-07-09 ENCOUNTER — Ambulatory Visit (INDEPENDENT_AMBULATORY_CARE_PROVIDER_SITE_OTHER): Payer: PPO

## 2021-07-09 ENCOUNTER — Ambulatory Visit (INDEPENDENT_AMBULATORY_CARE_PROVIDER_SITE_OTHER): Payer: PPO | Admitting: Internal Medicine

## 2021-07-09 VITALS — BP 118/68 | HR 94 | Temp 98.9°F | Ht 66.0 in | Wt 135.8 lb

## 2021-07-09 DIAGNOSIS — M810 Age-related osteoporosis without current pathological fracture: Secondary | ICD-10-CM | POA: Diagnosis not present

## 2021-07-09 DIAGNOSIS — F411 Generalized anxiety disorder: Secondary | ICD-10-CM

## 2021-07-09 DIAGNOSIS — M25552 Pain in left hip: Secondary | ICD-10-CM | POA: Diagnosis not present

## 2021-07-09 NOTE — Progress Notes (Unsigned)
Patient ID: Shelby Grant, female   DOB: 06-27-1955, 66 y.o.   MRN: [id number]        Chief Complaint: follow up left groin pain on walking       HPI:  Shelby Grant is a 66 y.o. female here with 2-3 wks worsening now mod to occasinally severe left groin pain on standing up and walking, better to sit, no giveaways or falls, and no recent unusual activity it seems.  Has hx of right hip DJD with THR, this pain not exactly the same but somewhat similar anyway.  No radiation to the back or more distal leg.  No clear radicular symptoms, knee symptoms or claudiation.  Pt denies chest pain, increased sob or doe, wheezing, orthopnea, PND, increased LE swelling, palpitations, dizziness or syncope.   Pt denies polydipsia, polyuria, or new focal neuro s/s.    Pt denies fever, wt loss, night sweats, loss of appetite, or other constitutional symptoms  Denies worsening depressive symptoms, suicidal ideation, or panic;       Wt Readings from Last 3 Encounters:  07/09/21 135 lb 12.8 oz (61.6 kg)  09/26/20 133 lb (60.3 kg)  09/18/19 136 lb 9.6 oz (62 kg)   BP Readings from Last 3 Encounters:  07/09/21 118/68  04/02/21 125/81  09/26/20 114/70         Past Medical History:  Diagnosis Date   ALLERGIC RHINITIS 07/03/2008   Allergy    seasonal   ANXIETY 06/02/2007   Arthritis    BELL'S PALSY, LEFT 12/02/2008   Cancer ([place]) 01/16/2015   kidney   CELLULITIS, LEFT LEG 09/14/2008   CHEST PAIN 03/10/3783   Complication of anesthesia 08-28-2014   "work up in recovery room with jaw locked open"   CORNS AND CALLUSES 07/03/2008   Depression    FOOT PAIN 09/22/2009   History of kidney cancer    IRREGULAR MENSES 06/03/2008   Neoplasm    left renal   OCD (obsessive compulsive disorder)    Renal disorder    Stress fracture of ankle since Jun 29, 2014   left   Supraventricular tachycardia ([place]) 06/02/2007   Atrial tachycardia with Wenckebach periodicity   Past Surgical History:  Procedure Laterality Date    APPENDECTOMY  20016   BLEPHAROPLASTY Bilateral 10/31/2017   COLONOSCOPY  2009   HIP ARTHROPLASTY Right 11/18/2017   LAPAROSCOPIC APPENDECTOMY N/A 08/28/2014   Procedure: APPENDECTOMY LAPAROSCOPIC;  Surgeon: Judeth Horn, MD;  Location: [facility];  Service: General;  Laterality: N/A;   ROBOTIC ASSITED PARTIAL NEPHRECTOMY Left 01/06/2015   Procedure: ROBOTIC ASSISTED PARTIAL NEPHRECTOMY;  Surgeon: Raynelle Bring, MD;  Location: [facility];  Service: Urology;  Laterality: Left;   WISDOM TOOTH EXTRACTION      reports that she has never smoked. She has never used smokeless tobacco. She reports current alcohol use of about 2.0 standard drinks of alcohol per week. She reports that she does not use drugs. family history includes Asthma in her sister; Cancer in her mother; Colon polyps in her sister. Allergies  Allergen Reactions   Latex     REACTION: rash   Macrobid [Nitrofurantoin Macrocrystal] Swelling and Other (See Comments)    Lips and inside of mouth swelling   Amoxicillin Rash    Has patient had a PCN reaction causing immediate rash, facial/tongue/throat swelling, SOB or lightheadedness with hypotension: unkown Has patient had a PCN reaction causing severe rash involving mucus membranes or skin necrosis: no Has patient had a PCN reaction that  required hospitalization No Has patient had a PCN reaction occurring within the last 10 years: unknown If all of the above answers are "NO", then may proceed with Cephalosporin use.   Current Outpatient Medications on File Prior to Visit  Medication Sig Dispense Refill   Acetaminophen (TYLENOL PO) Take 1,000 mg by mouth daily as needed.      alendronate (FOSAMAX) 70 MG tablet Take 1 tablet (70 mg total) by mouth every 7 (seven) days. Take with a full glass of water on an empty stomach. 12 tablet 3   ALFALFA PO Take by mouth.     b complex vitamins tablet Take 1 tablet by mouth daily.     buPROPion (WELLBUTRIN XL) 300 MG 24 hr tablet Take 1 tablet (300 mg  total) by mouth daily. 90 tablet 3   Calcium-Magnesium-Vitamin D (CALCIUM MAGNESIUM PO) Take 4 tablets by mouth daily.      clonazePAM (KLONOPIN) 1 MG tablet 1 tab by mouth once daily as needed 30 tablet 2   co-enzyme Q-10 30 MG capsule Take 30 mg by mouth 3 (three) times daily.     cyanocobalamin 100 MCG tablet Take 100 mcg by mouth daily.     ibuprofen (ADVIL) 200 MG tablet Take 200 mg by mouth every 6 (six) hours as needed.     metoprolol succinate (TOPROL-XL) 25 MG 24 hr tablet Take 0.5 tablets (12.5 mg total) by mouth 2 (two) times daily. 90 tablet 3   Multiple Vitamin (MULTIVITAMIN) capsule Take 1 capsule by mouth daily.     PREVIDENT 5000 SENSITIVE 1.1-5 % GEL See admin instructions.     Probiotic Product (ALIGN) 4 MG CAPS Take 1 capsule by mouth daily.      tretinoin (RETIN-A) 0.025 % cream Apply topically.     zinc gluconate 50 MG tablet Take 50 mg by mouth daily.     TURMERIC PO Take 3 tablets by mouth daily.  (Patient not taking: Reported on 09/26/2020)     [DISCONTINUED] estrogen, conjugated,-medroxyprogesterone (PREMPRO) 0.625-2.5 MG per tablet Take 1 tablet by mouth daily. 28 tablet 11   No current facility-administered medications on file prior to visit.        ROS:  All others reviewed and negative.  Objective        PE:  BP 118/68 (BP Location: Left Arm, Patient Position: Sitting, Cuff Size: Normal)   Pulse 94   Temp 98.9 F (37.2 C) (Oral)   Ht '5\' 6"'$  (1.676 m)   Wt 135 lb 12.8 oz (61.6 kg)   LMP 07/10/2012   SpO2 97%   BMI 21.92 kg/m                 Constitutional: Pt appears in NAD               HENT: Head: NCAT.                Right Ear: External ear normal.                 Left Ear: External ear normal.                Eyes: . Pupils are equal, round, and reactive to light. Conjunctivae and EOM are normal               Nose: without d/c or deformity               Neck: Neck supple. Gross normal ROM  Cardiovascular: Normal rate and regular rhythm.                  Pulmonary/Chest: Effort normal and breath sounds without rales or wheezing.                Abd:  Soft, NT, ND, + BS, no organomegaly               Neurological: Pt is alert. At baseline orientation, motor grossly intact               Skin: LE edema - none, left hip has reduced ROM about 10 degrees compared to right in flexion , and mod pain to internal movement, less to external rotation               Psychiatric: Pt behavior is normal without agitation , mild nervous  Micro: none  Cardiac tracings I have personally interpreted today:  none  Pertinent Radiological findings (summarize): none   Lab Results  Component Value Date   WBC 5.3 09/25/2020   HGB 13.1 09/25/2020   HCT 39.9 09/25/2020   PLT 225.0 09/25/2020   GLUCOSE 77 09/25/2020   CHOL 237 (H) 09/25/2020   TRIG 67.0 09/25/2020   HDL 85.50 09/25/2020   LDLDIRECT 137.5 10/06/2012   LDLCALC 138 (H) 09/25/2020   ALT 19 09/25/2020   AST 17 09/25/2020   NA 138 09/25/2020   K 4.1 09/25/2020   CL 102 09/25/2020   CREATININE 0.84 09/25/2020   BUN 15 09/25/2020   CO2 31 09/25/2020   TSH 1.61 09/25/2020   HGBA1C 5.7 09/25/2020   Assessment/Plan:  CHYANNA FLOCK is a 66 y.o. White or Caucasian [1] female with  has a past medical history of ALLERGIC RHINITIS (07/03/2008), Allergy, ANXIETY (06/02/2007), Arthritis, BELL'S PALSY, LEFT (12/02/2008), Cancer ([place]) (01/16/2015), CELLULITIS, LEFT LEG (09/14/2008), CHEST PAIN (3/0/0923), Complication of anesthesia (08-28-2014), CORNS AND CALLUSES (07/03/2008), Depression, FOOT PAIN (09/22/2009), History of kidney cancer, IRREGULAR MENSES (06/03/2008), Neoplasm, OCD (obsessive compulsive disorder), Renal disorder, Stress fracture of ankle (since Jun 29, 2014), and Supraventricular tachycardia ([place]) (06/02/2007).  Left hip pain Worsening 2-3 wks now mod to occasionally severe, high suspicion left hip arthritis more symptomatic, cant r/o avn or other - for plain film today, MRI left hip to  confirm, and pt will consider referral to Dr Alvan Dame for evaluation, but pt prefers to return to [facility] orthopedic who performed her right hip THR if this is needed  Osteoporosis Proven by dxa aug 2022, prolia not approved by insurance at that time, pt to continue fosamax 70 q wk  Generalized anxiety disorder Pt reassured, cont klonopin 1 mg bid prn  Followup: Return if symptoms worsen or fail to improve.  Cathlean Cower, MD 07/11/2021 5:09 PM [facility]

## 2021-07-09 NOTE — Patient Instructions (Addendum)
Please continue all other medications as before, and refills have been done if requested.  Please have the pharmacy call with any other refills you may need.  Please keep your appointments with your specialists as you may have planned - the [facility] orthopedic who did your previous right hip surgury  You will be contacted regarding the referral for: MRI for the left hip joint  Please go to the XRAY Department in the first floor for the x-ray testing  You will be contacted by phone if any changes need to be made immediately.  Otherwise, you will receive a letter about your results with an explanation, but please check with MyChart first.  Please remember to sign up for MyChart if you have not done so, as this will be important to you in the future with finding out test results, communicating by private email, and scheduling acute appointments online when needed.

## 2021-07-11 ENCOUNTER — Encounter: Payer: Self-pay | Admitting: Internal Medicine

## 2021-07-11 ENCOUNTER — Other Ambulatory Visit: Payer: Self-pay | Admitting: Internal Medicine

## 2021-07-11 DIAGNOSIS — M25552 Pain in left hip: Secondary | ICD-10-CM

## 2021-07-11 DIAGNOSIS — M1612 Unilateral primary osteoarthritis, left hip: Secondary | ICD-10-CM

## 2021-07-11 NOTE — Assessment & Plan Note (Addendum)
Proven by dxa aug 2022, prolia not approved by insurance at that time, pt to continue fosamax 70 q wk

## 2021-07-11 NOTE — Assessment & Plan Note (Signed)
Pt reassured, cont klonopin 1 mg bid prn

## 2021-07-11 NOTE — Assessment & Plan Note (Signed)
Worsening 2-3 wks now mod to occasionally severe, high suspicion left hip arthritis more symptomatic, cant r/o avn or other - for plain film today, MRI left hip to confirm, and pt will consider referral to Dr Alvan Dame for evaluation, but pt prefers to return to [facility] orthopedic who performed her right hip THR if this is needed

## 2021-07-12 ENCOUNTER — Encounter: Payer: Self-pay | Admitting: Internal Medicine

## 2021-07-13 ENCOUNTER — Telehealth: Payer: Self-pay | Admitting: Internal Medicine

## 2021-07-13 ENCOUNTER — Ambulatory Visit
Admission: RE | Admit: 2021-07-13 | Discharge: 2021-07-13 | Disposition: A | Discharge status: Home / Self Care | Payer: PPO | Source: Ambulatory Visit | Attending: Internal Medicine | Admitting: Internal Medicine

## 2021-07-13 DIAGNOSIS — M25552 Pain in left hip: Secondary | ICD-10-CM

## 2021-07-13 NOTE — Telephone Encounter (Signed)
Please call patient concerning MRI - she will need a copy of the disc and she also needs to talk about where this referral needs to go to.

## 2021-07-20 NOTE — Telephone Encounter (Signed)
Patient has received copy of MRI on disc

## 2021-08-19 ENCOUNTER — Ambulatory Visit
Admission: RE | Admit: 2021-08-19 | Discharge: 2021-08-19 | Disposition: A | Discharge status: Home / Self Care | Payer: PPO | Source: Ambulatory Visit | Attending: Urology | Admitting: Urology

## 2021-08-19 ENCOUNTER — Other Ambulatory Visit: Payer: Self-pay | Admitting: Urology

## 2021-08-19 DIAGNOSIS — C642 Malignant neoplasm of left kidney, except renal pelvis: Secondary | ICD-10-CM

## 2021-09-28 ENCOUNTER — Other Ambulatory Visit (INDEPENDENT_AMBULATORY_CARE_PROVIDER_SITE_OTHER): Payer: PPO

## 2021-09-28 DIAGNOSIS — E559 Vitamin D deficiency, unspecified: Secondary | ICD-10-CM | POA: Diagnosis not present

## 2021-09-28 DIAGNOSIS — E7800 Pure hypercholesterolemia, unspecified: Secondary | ICD-10-CM | POA: Diagnosis not present

## 2021-09-28 DIAGNOSIS — E538 Deficiency of other specified B group vitamins: Secondary | ICD-10-CM

## 2021-09-28 LAB — VITAMIN B12: Vitamin B-12: 509 pg/mL (ref 211–911)

## 2021-09-28 LAB — BASIC METABOLIC PANEL
BUN: 14 mg/dL (ref 6–23)
CO2: 28 mEq/L (ref 19–32)
Calcium: 9.2 mg/dL (ref 8.4–10.5)
Chloride: 102 mEq/L (ref 96–112)
Creatinine, Ser: 0.83 mg/dL (ref 0.40–1.20)
GFR: 74.07 mL/min (ref >60.00)
Glucose, Bld: 93 mg/dL (ref 70–99)
Potassium: 3.6 mEq/L (ref 3.5–5.1)
Sodium: 140 mEq/L (ref 135–145)

## 2021-09-28 LAB — URINALYSIS, ROUTINE W REFLEX MICROSCOPIC
Bilirubin Urine: NEGATIVE
Ketones, ur: NEGATIVE
Leukocytes,Ua: NEGATIVE
Nitrite: POSITIVE — AB
Specific Gravity, Urine: 1.01 (ref 1.000–1.030)
Total Protein, Urine: NEGATIVE
Urine Glucose: NEGATIVE
Urobilinogen, UA: 0.2 (ref 0.0–1.0)
pH: 6.5 (ref 5.0–8.0)

## 2021-09-28 LAB — LIPID PANEL
Cholesterol: 266 mg/dL — ABNORMAL HIGH (ref 0–200)
HDL: 92.4 mg/dL (ref >39.00)
LDL Cholesterol: 153 mg/dL — ABNORMAL HIGH (ref 0–99)
NonHDL: 173.99
Total CHOL/HDL Ratio: 3
Triglycerides: 106 mg/dL (ref 0.0–149.0)
VLDL: 21.2 mg/dL (ref 0.0–40.0)

## 2021-09-28 LAB — CBC WITH DIFFERENTIAL/PLATELET
Basophils Absolute: 0.1 10*3/uL (ref 0.0–0.1)
Basophils Relative: 0.7 % (ref 0.0–3.0)
Eosinophils Absolute: 0.2 10*3/uL (ref 0.0–0.7)
Eosinophils Relative: 3.3 % (ref 0.0–5.0)
HCT: 39.3 % (ref 36.0–46.0)
Hemoglobin: 13.3 g/dL (ref 12.0–15.0)
Lymphocytes Relative: 26.1 % (ref 12.0–46.0)
Lymphs Abs: 1.9 10*3/uL (ref 0.7–4.0)
MCHC: 33.8 g/dL (ref 30.0–36.0)
MCV: 92.8 fl (ref 78.0–100.0)
Monocytes Absolute: 0.6 10*3/uL (ref 0.1–1.0)
Monocytes Relative: 8.2 % (ref 3.0–12.0)
Neutro Abs: 4.5 10*3/uL (ref 1.4–7.7)
Neutrophils Relative %: 61.7 % (ref 43.0–77.0)
Platelets: 223 10*3/uL (ref 150.0–400.0)
RBC: 4.24 Mil/uL (ref 3.87–5.11)
RDW: 13.7 % (ref 11.5–15.5)
WBC: 7.4 10*3/uL (ref 4.0–10.5)

## 2021-09-28 LAB — HEPATIC FUNCTION PANEL
ALT: 21 U/L (ref 0–35)
AST: 18 U/L (ref 0–37)
Albumin: 4.2 g/dL (ref 3.5–5.2)
Alkaline Phosphatase: 47 U/L (ref 39–117)
Bilirubin, Direct: 0.1 mg/dL (ref 0.0–0.3)
Total Bilirubin: 0.6 mg/dL (ref 0.2–1.2)
Total Protein: 6.8 g/dL (ref 6.0–8.3)

## 2021-09-28 LAB — VITAMIN D 25 HYDROXY (VIT D DEFICIENCY, FRACTURES): VITD: 45.96 ng/mL (ref 30.00–100.00)

## 2021-09-28 LAB — TSH: TSH: 1.9 u[IU]/mL (ref 0.35–5.50)

## 2021-09-29 ENCOUNTER — Ambulatory Visit (INDEPENDENT_AMBULATORY_CARE_PROVIDER_SITE_OTHER): Payer: PPO | Admitting: Internal Medicine

## 2021-09-29 ENCOUNTER — Encounter: Payer: Self-pay | Admitting: Internal Medicine

## 2021-09-29 VITALS — BP 126/78 | HR 74 | Temp 98.0°F | Ht 66.0 in | Wt 136.0 lb

## 2021-09-29 DIAGNOSIS — J309 Allergic rhinitis, unspecified: Secondary | ICD-10-CM

## 2021-09-29 DIAGNOSIS — E559 Vitamin D deficiency, unspecified: Secondary | ICD-10-CM | POA: Diagnosis not present

## 2021-09-29 DIAGNOSIS — F411 Generalized anxiety disorder: Secondary | ICD-10-CM

## 2021-09-29 DIAGNOSIS — E7800 Pure hypercholesterolemia, unspecified: Secondary | ICD-10-CM

## 2021-09-29 DIAGNOSIS — Z23 Encounter for immunization: Secondary | ICD-10-CM

## 2021-09-29 DIAGNOSIS — Z01419 Encounter for gynecological examination (general) (routine) without abnormal findings: Secondary | ICD-10-CM

## 2021-09-29 DIAGNOSIS — E538 Deficiency of other specified B group vitamins: Secondary | ICD-10-CM | POA: Diagnosis not present

## 2021-09-29 DIAGNOSIS — R739 Hyperglycemia, unspecified: Secondary | ICD-10-CM

## 2021-09-29 DIAGNOSIS — H6982 Other specified disorders of Eustachian tube, left ear: Secondary | ICD-10-CM

## 2021-09-29 DIAGNOSIS — Z0001 Encounter for general adult medical examination with abnormal findings: Secondary | ICD-10-CM

## 2021-09-29 MED ORDER — BUPROPION HCL ER (XL) 150 MG PO TB24: 150.0000 mg | ORAL_TABLET | Freq: Every day | ORAL | 0 refills | Status: DC

## 2021-09-29 NOTE — Patient Instructions (Addendum)
You will be contacted regarding the referral for: GYN  Please see [facility] for the Shingles shot  You had the Prevnar 20 pneumonia shot today  Please take OTC Zyrtec 10 mg per day, Nasacort as directed, and Mucinex 1200 mg twice per day for the left ear  Ok to decrease the wellbutrin XL to 150 mg per day for 1 month, then stop  Please continue all other medications as before, and refills have been done if requested.  Please have the pharmacy call with any other refills you may need.  Please continue your efforts at being more active, low cholesterol diet, and weight control.  You are otherwise up to date with prevention measures today.  Please keep your appointments with your specialists as you may have planned - Dr Benjamine Mola Sept 12  Please make an Appointment to return for your 1 year visit, or sooner if needed, with Lab testing by Appointment as well, to be done about 3-5 days before at the [facility] (so this is for TWO appointments - please see the scheduling desk as you leave)

## 2021-09-29 NOTE — Progress Notes (Unsigned)
Patient ID: Shelby Grant, female   DOB: [birth year], 66 y.o.   MRN: [id number]         Chief Complaint:: wellness exam and depression, left ear pain       HPI:  Shelby Grant is a 66 y.o. female here for wellness exam; plans to see GYN for pap but needs referral, due for prevnar 20, to have shingrix at local pharmacy, o/w up to date                        Also Pt denies chest pain, increased sob or doe, wheezing, orthopnea, PND, increased LE swelling, palpitations, dizziness or syncope.   Pt denies polydipsia, polyuria, or new focal neuro s/s.    Pt denies fever, wt loss, night sweats, loss of appetite, or other constitutional symptoms  Depression has been much improved recently, asking for weaning off wellbutrin if possible.  Does have several wks ongoing nasal allergy symptoms with clearish congestion, itch and sneezing, without fever, pain, ST, cough, swelling or wheezing, but does have marked left ear fullness and muffled hearing.  Has appt with ent on sept 12 Dr Red Christians Readings from Last 3 Encounters:  09/29/21 136 lb (61.7 kg)  07/09/21 135 lb 12.8 oz (61.6 kg)  09/26/20 133 lb (60.3 kg)   BP Readings from Last 3 Encounters:  09/29/21 126/78  07/09/21 118/68  04/02/21 125/81   Immunization History  Administered Date(s) Administered   Influenza Whole 11/07/2007, 12/02/2008, 10/29/2009, 11/10/2011   Influenza, High Dose Seasonal PF 09/11/2021   Influenza, Seasonal, Injecte, Preservative Fre 11/25/2016   Influenza,inj,Quad PF,6+ Mos 10/10/2012, 10/15/2014, 11/04/2017, 12/15/2018   Influenza,inj,quad, With Preservative 12/02/2016   Influenza-Unspecified 11/02/2013, 11/02/2014, 09/13/2015, 10/03/2015   PFIZER(Purple Top)SARS-COV-2 Vaccination 04/27/2019, 05/22/2019   PNEUMOCOCCAL CONJUGATE-20 09/29/2021   Td 11/03/1998, 06/03/2008   Tdap 08/11/2018   Zoster, Live 09/13/2015   Health Maintenance Due  Topic Date Due   PAP SMEAR-Modifier  03/29/2020      Past Medical  History:  Diagnosis Date   ALLERGIC RHINITIS 07/03/2008   Allergy    seasonal   ANXIETY 06/02/2007   Arthritis    BELL'S PALSY, LEFT 12/02/2008   Cancer ([place]) 01/16/2015   kidney   CELLULITIS, LEFT LEG 09/14/2008   CHEST PAIN 03/08/2351   Complication of anesthesia 08-28-2014   "work up in recovery room with jaw locked open"   CORNS AND CALLUSES 07/03/2008   Depression    FOOT PAIN 09/22/2009   History of kidney cancer    IRREGULAR MENSES 06/03/2008   Neoplasm    left renal   OCD (obsessive compulsive disorder)    Renal disorder    Stress fracture of ankle since Jun 29, 2014   left   Supraventricular tachycardia ([place]) 06/02/2007   Atrial tachycardia with Wenckebach periodicity   Past Surgical History:  Procedure Laterality Date   APPENDECTOMY  20016   BLEPHAROPLASTY Bilateral 10/31/2017   COLONOSCOPY  2009   HIP ARTHROPLASTY Right 11/18/2017   LAPAROSCOPIC APPENDECTOMY N/A 08/28/2014   Procedure: APPENDECTOMY LAPAROSCOPIC;  Surgeon: Judeth Horn, MD;  Location: [facility];  Service: General;  Laterality: N/A;   ROBOTIC ASSITED PARTIAL NEPHRECTOMY Left 01/06/2015   Procedure: ROBOTIC ASSISTED PARTIAL NEPHRECTOMY;  Surgeon: Raynelle Bring, MD;  Location[facility];  Service: Urology;  Laterality: Left;   WISDOM TOOTH EXTRACTION      reports that she has never smoked. She has never used smokeless tobacco. She reports  current alcohol use of about 2.0 standard drinks of alcohol per week. She reports that she does not use drugs. family history includes Asthma in her sister; Cancer in her mother; Colon polyps in her sister. Allergies  Allergen Reactions   Latex     REACTION: rash   Macrobid [Nitrofurantoin Macrocrystal] Swelling and Other (See Comments)    Lips and inside of mouth swelling   Amoxicillin Rash    Has patient had a PCN reaction causing immediate rash, facial/tongue/throat swelling, SOB or lightheadedness with hypotension: unkown Has patient had a PCN reaction causing severe rash  involving mucus membranes or skin necrosis: no Has patient had a PCN reaction that required hospitalization No Has patient had a PCN reaction occurring within the last 10 years: unknown If all of the above answers are "NO", then may proceed with Cephalosporin use.   Current Outpatient Medications on File Prior to Visit  Medication Sig Dispense Refill   Acetaminophen (TYLENOL PO) Take 1,000 mg by mouth daily as needed.      alendronate (FOSAMAX) 70 MG tablet Take 1 tablet (70 mg total) by mouth every 7 (seven) days. Take with a full glass of water on an empty stomach. 12 tablet 3   ALFALFA PO Take by mouth.     b complex vitamins tablet Take 1 tablet by mouth daily.     Calcium-Magnesium-Vitamin D (CALCIUM MAGNESIUM PO) Take 4 tablets by mouth daily.      clonazePAM (KLONOPIN) 1 MG tablet 1 tab by mouth once daily as needed 30 tablet 2   cyanocobalamin 100 MCG tablet Take 100 mcg by mouth daily.     ibuprofen (ADVIL) 200 MG tablet Take 200 mg by mouth every 6 (six) hours as needed.     metoprolol succinate (TOPROL-XL) 25 MG 24 hr tablet Take 0.5 tablets (12.5 mg total) by mouth 2 (two) times daily. 90 tablet 3   Multiple Vitamin (MULTIVITAMIN) capsule Take 1 capsule by mouth daily.     PREVIDENT 5000 SENSITIVE 1.1-5 % GEL See admin instructions.     Probiotic Product (ALIGN) 4 MG CAPS Take 1 capsule by mouth daily.      tretinoin (RETIN-A) 0.025 % cream Apply topically.     zinc gluconate 50 MG tablet Take 50 mg by mouth daily.     [DISCONTINUED] estrogen, conjugated,-medroxyprogesterone (PREMPRO) 0.625-2.5 MG per tablet Take 1 tablet by mouth daily. 28 tablet 11   No current facility-administered medications on file prior to visit.        ROS:  All others reviewed and negative.  Objective        PE:  BP 126/78 (BP Location: Right Arm, Patient Position: Sitting, Cuff Size: Large)   Pulse 74   Temp 98 F (36.7 C) (Oral)   Ht '5\' 6"'$  (1.676 m)   Wt 136 lb (61.7 kg)   LMP 07/10/2012    SpO2 97%   BMI 21.95 kg/m                 Constitutional: Pt appears in NAD               HENT: Head: NCAT.                Right Ear: External ear normal.                 Left Ear: External ear normal.   Left TM mild erythema and post effusion, sinus nontender  Eyes: . Pupils are equal, round, and reactive to light. Conjunctivae and EOM are normal               Nose: without d/c or deformity               Neck: Neck supple. Gross normal ROM               Cardiovascular: Normal rate and regular rhythm.                 Pulmonary/Chest: Effort normal and breath sounds without rales or wheezing.                Abd:  Soft, NT, ND, + BS, no organomegaly               Neurological: Pt is alert. At baseline orientation, motor grossly intact               Skin: Skin is warm. No rashes, no other new lesions, LE edema - none               Psychiatric: Pt behavior is normal without agitation   Micro: none  Cardiac tracings I have personally interpreted today:  none  Pertinent Radiological findings (summarize): none   Lab Results  Component Value Date   WBC 7.4 09/28/2021   HGB 13.3 09/28/2021   HCT 39.3 09/28/2021   PLT 223.0 09/28/2021   GLUCOSE 93 09/28/2021   CHOL 266 (H) 09/28/2021   TRIG 106.0 09/28/2021   HDL 92.40 09/28/2021   LDLDIRECT 137.5 10/06/2012   LDLCALC 153 (H) 09/28/2021   ALT 21 09/28/2021   AST 18 09/28/2021   NA 140 09/28/2021   K 3.6 09/28/2021   CL 102 09/28/2021   CREATININE 0.83 09/28/2021   BUN 14 09/28/2021   CO2 28 09/28/2021   TSH 1.90 09/28/2021   HGBA1C 5.7 09/25/2020   Assessment/Plan:  ARILLA HICE is a 66 y.o. White or Caucasian [1] female with  has a past medical history of ALLERGIC RHINITIS (07/03/2008), Allergy, ANXIETY (06/02/2007), Arthritis, BELL'S PALSY, LEFT (12/02/2008), Cancer ([place]) (01/16/2015), CELLULITIS, LEFT LEG (09/14/2008), CHEST PAIN (05/05/3152), Complication of anesthesia (08-28-2014), CORNS AND CALLUSES (07/03/2008),  Depression, FOOT PAIN (09/22/2009), History of kidney cancer, IRREGULAR MENSES (06/03/2008), Neoplasm, OCD (obsessive compulsive disorder), Renal disorder, Stress fracture of ankle (since Jun 29, 2014), and Supraventricular tachycardia ([place]) (06/02/2007).  Encounter for well adult exam with abnormal findings Age and sex appropriate education and counseling updated with regular exercise and diet Referrals for preventative services - for GYN referral for pap Immunizations addressed - none needed Smoking counseling  - none needed Evidence for depression or other mood disorder - depression improved, OCD stable Most recent labs reviewed. I have personally reviewed and have noted: 1) the patient's medical and social history 2) The patient's current medications and supplements 3) The patient's height, weight, and BMI have been recorded in the chart   Allergic rhinitis Please take OTC Zyrtec 10 mg per day, Nasacort as directed, and Mucinex 1200 mg twice per day for the left ear   Acute dysfunction of left eustachian tube Please take OTC Zyrtec 10 mg per day, Nasacort as directed, and Mucinex 1200 mg twice per day for the left ear, f/u Dr Benjamine Mola ENT sept 12 as planned  Generalized anxiety disorder Overall stable, asks for trial weaning down  - for wellbutrin xl 150 qd for 1 mo, then stop  HLD (hyperlipidemia) Lab Results  Component Value  Date   LDLCALC 153 (H) 09/28/2021   Unocntrolled, goal ldl < 100, pt to continue current low chol diet - declines statin for now  Followup: No follow-ups on file.  Shelby Cower, MD 09/30/2021 4:30 PM [facility]

## 2021-09-30 ENCOUNTER — Encounter: Payer: Self-pay | Admitting: Internal Medicine

## 2021-09-30 DIAGNOSIS — H6982 Other specified disorders of Eustachian tube, left ear: Secondary | ICD-10-CM | POA: Insufficient documentation

## 2021-09-30 NOTE — Assessment & Plan Note (Addendum)
Please take OTC Zyrtec 10 mg per day, Nasacort as directed, and Mucinex 1200 mg twice per day for the left ear, f/u Dr Benjamine Mola ENT sept 12 as planned

## 2021-09-30 NOTE — Assessment & Plan Note (Signed)
Age and sex appropriate education and counseling updated with regular exercise and diet Referrals for preventative services - for GYN referral for pap Immunizations addressed - none needed Smoking counseling  - none needed Evidence for depression or other mood disorder - depression improved, OCD stable Most recent labs reviewed. I have personally reviewed and have noted: 1) the patient's medical and social history 2) The patient's current medications and supplements 3) The patient's height, weight, and BMI have been recorded in the chart

## 2021-09-30 NOTE — Assessment & Plan Note (Signed)
Lab Results  Component Value Date   LDLCALC 153 (H) 09/28/2021   Unocntrolled, goal ldl < 100, pt to continue current low chol diet - declines statin for now

## 2021-09-30 NOTE — Assessment & Plan Note (Signed)
Overall stable, asks for trial weaning down  - for wellbutrin xl 150 qd for 1 mo, then stop

## 2021-09-30 NOTE — Assessment & Plan Note (Signed)
Please take OTC Zyrtec 10 mg per day, Nasacort as directed, and Mucinex 1200 mg twice per day for the left ear

## 2021-10-14 ENCOUNTER — Other Ambulatory Visit: Payer: Self-pay

## 2021-10-14 MED ORDER — METOPROLOL SUCCINATE ER 25 MG PO TB24: 12.5000 mg | ORAL_TABLET | Freq: Two times a day (BID) | ORAL | 3 refills | Status: DC

## 2021-10-14 NOTE — Telephone Encounter (Signed)
No results yet

## 2021-10-14 NOTE — Telephone Encounter (Signed)
Sorry, last message was error.  Lab testing is essentially normal, including the urine testing with 0 WBC - rest of the UA is not important

## 2021-10-15 ENCOUNTER — Telehealth: Payer: Self-pay

## 2021-10-15 ENCOUNTER — Emergency Department (HOSPITAL_BASED_OUTPATIENT_CLINIC_OR_DEPARTMENT_OTHER)
Admission: EM | Admit: 2021-10-15 | Discharge: 2021-10-15 | Disposition: A | Discharge status: Home / Self Care | Payer: PPO | Attending: Emergency Medicine | Admitting: Emergency Medicine

## 2021-10-15 ENCOUNTER — Emergency Department (HOSPITAL_BASED_OUTPATIENT_CLINIC_OR_DEPARTMENT_OTHER): Payer: PPO | Admitting: Radiology

## 2021-10-15 ENCOUNTER — Encounter (HOSPITAL_BASED_OUTPATIENT_CLINIC_OR_DEPARTMENT_OTHER): Payer: Self-pay

## 2021-10-15 ENCOUNTER — Other Ambulatory Visit: Payer: Self-pay

## 2021-10-15 DIAGNOSIS — F458 Other somatoform disorders: Secondary | ICD-10-CM | POA: Diagnosis not present

## 2021-10-15 DIAGNOSIS — Z91040 Latex allergy status: Secondary | ICD-10-CM | POA: Insufficient documentation

## 2021-10-15 DIAGNOSIS — R0989 Other specified symptoms and signs involving the circulatory and respiratory systems: Secondary | ICD-10-CM

## 2021-10-15 NOTE — ED Notes (Signed)
Patient transported to X-ray 

## 2021-10-15 NOTE — ED Provider Notes (Signed)
[facility] EMERGENCY DEPT Provider Note   CSN: [id number] Arrival date & time: 10/15/21  1925     History  Chief Complaint  Patient presents with   Globus    Shelby Grant is a 66 y.o. female.  HPI Patient presents after potentially swallowing a piece of a plastic bottle cap.  States she was drinking some leftover coffee out of a bottle and pieces bottle came off she swallowed it.  States some came out of her mouth but some feels if it is in her throat.  Feels its on the left side.  States she has been nervous to eat.  No difficulty drinking.  This happened earlier today.  No choking.    Home Medications Prior to Admission medications   Medication Sig Start Date End Date Taking? Authorizing Provider  Acetaminophen (TYLENOL PO) Take 1,000 mg by mouth daily as needed.     [provider]  alendronate (FOSAMAX) 70 MG tablet Take 1 tablet (70 mg total) by mouth every 7 (seven) days. Take with a full glass of water on an empty stomach. 01/20/21   Biagio Borg, MD  ALFALFA PO Take by mouth.    [provider]  b complex vitamins tablet Take 1 tablet by mouth daily.    [provider]  buPROPion (WELLBUTRIN XL) 150 MG 24 hr tablet Take 1 tablet (150 mg total) by mouth daily. 09/29/21   Biagio Borg, MD  Calcium-Magnesium-Vitamin D (CALCIUM MAGNESIUM PO) Take 4 tablets by mouth daily.     [provider]  clonazePAM Bobbye [place]) 1 MG tablet 1 tab by mouth once daily as needed 07/07/21   Biagio Borg, MD  cyanocobalamin 100 MCG tablet Take 100 mcg by mouth daily.    [provider]  ibuprofen (ADVIL) 200 MG tablet Take 200 mg by mouth every 6 (six) hours as needed.    [provider]  metoprolol succinate (TOPROL-XL) 25 MG 24 hr tablet Take 0.5 tablets (12.5 mg total) by mouth 2 (two) times daily. 10/14/21   Biagio Borg, MD  Multiple Vitamin (MULTIVITAMIN) capsule Take 1 capsule by mouth daily.    [provider]   PREVIDENT 5000 SENSITIVE 1.1-5 % GEL See admin instructions. 03/10/21   [provider]  Probiotic Product (ALIGN) 4 MG CAPS Take 1 capsule by mouth daily.     [provider]  tretinoin (RETIN-A) 0.025 % cream Apply topically. 03/04/21   [provider]  zinc gluconate 50 MG tablet Take 50 mg by mouth daily.    [provider]  estrogen, conjugated,-medroxyprogesterone (PREMPRO) 0.625-2.5 MG per tablet Take 1 tablet by mouth daily. 02/11/11 03/09/17  Dorena Cookey, MD      Allergies    Latex, Macrobid [nitrofurantoin macrocrystal], and Amoxicillin    Review of Systems   Review of Systems  Physical Exam Updated Vital Signs BP (!) 127/90 (BP Location: Right Arm)   Pulse 66   Temp 97.6 F (36.4 C)   Resp 18   Ht 5' 6.5" (1.689 m)   Wt 60.3 kg   LMP 07/10/2012   SpO2 99%   BMI 21.15 kg/m  Physical Exam Vitals and nursing note reviewed.  HENT:     Head: Atraumatic.     Mouth/Throat:     Mouth: Mucous membranes are moist.     Pharynx: No oropharyngeal exudate or posterior oropharyngeal erythema.  Cardiovascular:     Rate and Rhythm: Regular rhythm.  Pulmonary:  Breath sounds: No rhonchi.  Abdominal:     Tenderness: There is no abdominal tenderness.  Musculoskeletal:     Cervical back: No rigidity or tenderness.  Lymphadenopathy:     Cervical: No cervical adenopathy.  Neurological:     Mental Status: She is alert.     ED Results / Procedures / Treatments   Labs (all labs ordered are listed, but only abnormal results are displayed) Labs Reviewed - No data to display  EKG None  Radiology DG Neck Soft Tissue  Result Date: 10/15/2021 CLINICAL DATA:  Foreign body. EXAM: NECK SOFT TISSUES - 1+ VIEW; CHEST - 2 VIEW COMPARISON:  Chest radiograph dated 08/19/2021. FINDINGS: There is no evidence of retropharyngeal soft tissue swelling or epiglottic enlargement. The cervical airway is unremarkable and no radio-opaque foreign body  identified. There is diffuse chronic interstitial coarsening. No focal consolidation, pleural effusion, pneumothorax. The cardiac silhouette is within normal limits. No acute osseous pathology. Degenerative changes of the spine. IMPRESSION: 1. No acute cardiopulmonary process. 2. No radiopaque foreign object. Electronically Signed   By: Anner Crete M.D.   On: 10/15/2021 21:04   DG Chest 2 View  Result Date: 10/15/2021 CLINICAL DATA:  Foreign body. EXAM: NECK SOFT TISSUES - 1+ VIEW; CHEST - 2 VIEW COMPARISON:  Chest radiograph dated 08/19/2021. FINDINGS: There is no evidence of retropharyngeal soft tissue swelling or epiglottic enlargement. The cervical airway is unremarkable and no radio-opaque foreign body identified. There is diffuse chronic interstitial coarsening. No focal consolidation, pleural effusion, pneumothorax. The cardiac silhouette is within normal limits. No acute osseous pathology. Degenerative changes of the spine. IMPRESSION: 1. No acute cardiopulmonary process. 2. No radiopaque foreign object. Electronically Signed   By: Anner Crete M.D.   On: 10/15/2021 21:04    Procedures Procedures    Medications Ordered in ED Medications - No data to display  ED Course/ Medical Decision Making/ A&P                           Medical Decision Making Amount and/or Complexity of Data Reviewed Radiology: ordered.   Patient with globus sensation after swallowing a piece of plastic.  X-rays done and reassuring although plastic potentially would not show up.  No signs of other pathology.  Doubt perforation.  Discussed with patient that most likely piece of plastic would pass on its own.  We will follow-up with GI as needed.  Appears stable for discharge home.  Doubt severe laceration at this time.        Final Clinical Impression(s) / ED Diagnoses Final diagnoses:  Globus sensation    Rx / DC Orders ED Discharge Orders     None         Davonna Belling,  MD 10/15/21 2148

## 2021-10-15 NOTE — ED Triage Notes (Signed)
Sts she accidentally swallowed a plastic piece that goes around bottle cap while drinking some [employer].   Globus sensation in throat. Also sts inability to swallow and liquid.

## 2021-10-15 NOTE — Telephone Encounter (Signed)
Called pt, made aware of Dr. Jenny Reichmann notes in regards to her lab testing and urine testing being normal

## 2021-10-15 NOTE — ED Notes (Signed)
Patient given PO challenge of water, no difficulty with swallowing at this time.

## 2021-11-04 ENCOUNTER — Other Ambulatory Visit: Payer: Self-pay | Admitting: Internal Medicine

## 2021-11-26 ENCOUNTER — Ambulatory Visit (HOSPITAL_COMMUNITY): Admit: 2021-11-26 | Payer: PPO

## 2021-11-26 ENCOUNTER — Encounter (HOSPITAL_COMMUNITY): Payer: Self-pay

## 2021-11-26 ENCOUNTER — Ambulatory Visit (HOSPITAL_COMMUNITY)
Admission: EM | Admit: 2021-11-26 | Discharge: 2021-11-26 | Disposition: A | Discharge status: Home / Self Care | Payer: PPO | Attending: Physician Assistant | Admitting: Physician Assistant

## 2021-11-26 VITALS — BP 125/69 | HR 66 | Temp 98.3°F | Resp 18

## 2021-11-26 DIAGNOSIS — J329 Chronic sinusitis, unspecified: Secondary | ICD-10-CM | POA: Diagnosis not present

## 2021-11-26 DIAGNOSIS — J40 Bronchitis, not specified as acute or chronic: Secondary | ICD-10-CM

## 2021-11-26 MED ORDER — DOXYCYCLINE HYCLATE 100 MG PO CAPS: 100.0000 mg | ORAL_CAPSULE | Freq: Two times a day (BID) | ORAL | 0 refills | Status: DC

## 2021-11-26 NOTE — Discharge Instructions (Signed)
Take doxycycline 100 mg twice daily for 10 days.  Stay out of the sun while on this medication as it makes you more sensitive to the sun.  Continue over-the-counter medications including Mucinex, Flonase, Tylenol.  Make sure you rest and drink plenty of fluid.  If your symptoms do not improving by next week return for reevaluation.  If anything worsens please be seen immediately.

## 2021-11-26 NOTE — ED Provider Notes (Signed)
[facility]    CSN: [id number] Arrival date & time: 11/26/21  1753      History   Chief Complaint No chief complaint on file.   HPI Shelby Grant is a 66 y.o. female.   Patient presents today with a 2-week history of URI symptoms including sore throat, postnasal drip, sinus pressure, cough, hoarseness.  Denies any chest pain, shortness of breath, nausea, vomiting, diarrhea.  Has not tried any over-the-counter medication for symptom management.  She does have a history of allergies but is not required regular antihistamines in more than 40 years.  Denies history of asthma, COPD, smoking.  She has had COVID-19 last year.  Has had COVID vaccines but not had booster.  Denies any recent antibiotics or steroids.    Past Medical History:  Diagnosis Date   ALLERGIC RHINITIS 07/03/2008   Allergy    seasonal   ANXIETY 06/02/2007   Arthritis    BELL'S PALSY, LEFT 12/02/2008   Cancer ([place]) 01/16/2015   kidney   CELLULITIS, LEFT LEG 09/14/2008   CHEST PAIN 06/09/5927   Complication of anesthesia 08-28-2014   "work up in recovery room with jaw locked open"   CORNS AND CALLUSES 07/03/2008   Depression    FOOT PAIN 09/22/2009   History of kidney cancer    IRREGULAR MENSES 06/03/2008   Neoplasm    left renal   OCD (obsessive compulsive disorder)    Renal disorder    Stress fracture of ankle since Jun 29, 2014   left   Supraventricular tachycardia 06/02/2007   Atrial tachycardia with Wenckebach periodicity    Patient Active Problem List   Diagnosis Date Noted   Acute dysfunction of left eustachian tube 09/30/2021   Left hip pain 07/09/2021   Osteoporosis 09/22/2019   Loss of transverse plantar arch 12/04/2018   Renal cell cancer ([place]) 08/11/2018   Refusal of blood transfusions as patient is Jehovah's Witness 08/11/2018   HLD (hyperlipidemia) 08/11/2018   Encounter for screening for cervical cancer 03/29/2017   Encounter for well adult exam with abnormal findings 03/29/2017    Colon cancer screening 12/04/2015   Menopause syndrome 02/11/2011   Allergic rhinitis 07/03/2008   Supraventricular tachycardia ([place]) 06/02/2007   Generalized anxiety disorder 06/02/2007    Past Surgical History:  Procedure Laterality Date   APPENDECTOMY  20016   BLEPHAROPLASTY Bilateral 10/31/2017   COLONOSCOPY  2009   HIP ARTHROPLASTY Right 11/18/2017   LAPAROSCOPIC APPENDECTOMY N/A 08/28/2014   Procedure: APPENDECTOMY LAPAROSCOPIC;  Surgeon: Judeth Horn, MD;  Location: [facility];  Service: General;  Laterality: N/A;   ROBOTIC ASSITED PARTIAL NEPHRECTOMY Left 01/06/2015   Procedure: ROBOTIC ASSISTED PARTIAL NEPHRECTOMY;  Surgeon: Raynelle Bring, MD;  Location: [facility];  Service: Urology;  Laterality: Left;   WISDOM TOOTH EXTRACTION      OB History   No obstetric history on file.      Home Medications    Prior to Admission medications   Medication Sig Start Date End Date Taking? Authorizing Provider  doxycycline (VIBRAMYCIN) 100 MG capsule Take 1 capsule (100 mg total) by mouth 2 (two) times daily. 11/26/21  Yes Jesalyn Finazzo, Derry Skill, PA-C  Acetaminophen (TYLENOL PO) Take 1,000 mg by mouth daily as needed.     [provider]  alendronate (FOSAMAX) 70 MG tablet Take 1 tablet (70 mg total) by mouth every 7 (seven) days. Take with a full glass of water on an empty stomach. 01/20/21   Biagio Borg, MD  ALFALFA  PO Take by mouth.    [provider]  b complex vitamins tablet Take 1 tablet by mouth daily.    [provider]  buPROPion (WELLBUTRIN XL) 150 MG 24 hr tablet TAKE 1 TABLET(150 MG) BY MOUTH DAILY 11/04/21   Biagio Borg, MD  Calcium-Magnesium-Vitamin D (CALCIUM MAGNESIUM PO) Take 4 tablets by mouth daily.     [provider]  clonazePAM Bobbye [place]) 1 MG tablet 1 tab by mouth once daily as needed 07/07/21   Biagio Borg, MD  cyanocobalamin 100 MCG tablet Take 100 mcg by mouth daily.    [provider]  ibuprofen (ADVIL) 200 MG tablet Take 200 mg  by mouth every 6 (six) hours as needed.    [provider]  metoprolol succinate (TOPROL-XL) 25 MG 24 hr tablet Take 0.5 tablets (12.5 mg total) by mouth 2 (two) times daily. 10/14/21   Biagio Borg, MD  Multiple Vitamin (MULTIVITAMIN) capsule Take 1 capsule by mouth daily.    [provider]  PREVIDENT 5000 SENSITIVE 1.1-5 % GEL See admin instructions. 03/10/21   [provider]  Probiotic Product (ALIGN) 4 MG CAPS Take 1 capsule by mouth daily.     [provider]  tretinoin (RETIN-A) 0.025 % cream Apply topically. 03/04/21   [provider]  zinc gluconate 50 MG tablet Take 50 mg by mouth daily.    [provider]  estrogen, conjugated,-medroxyprogesterone (PREMPRO) 0.625-2.5 MG per tablet Take 1 tablet by mouth daily. 02/11/11 03/09/17  Dorena Cookey, MD    Family History Family History  Problem Relation Age of Onset   Asthma Sister    Colon polyps Sister    Cancer Mother        [facility] where cancer started    Colon cancer Neg Hx    Esophageal cancer Neg Hx    Rectal cancer Neg Hx    Stomach cancer Neg Hx     Social History Social History   Tobacco Use   Smoking status: Never   Smokeless tobacco: Never  Vaping Use   Vaping Use: Never used  Substance Use Topics   Alcohol use: Yes    Alcohol/week: 2.0 standard drinks of alcohol    Types: 1 Glasses of wine, 1 Shots of liquor per week    Comment: social    Drug use: No     Allergies   Latex, Macrobid [nitrofurantoin macrocrystal], and Amoxicillin   Review of Systems Review of Systems  Constitutional:  Positive for activity change. Negative for appetite change, fatigue and fever.  HENT:  Positive for congestion, sinus pressure, sore throat and voice change. Negative for sneezing and trouble swallowing.   Respiratory:  Positive for cough. Negative for shortness of breath.   Cardiovascular:  Negative for chest pain.  Gastrointestinal:  Negative for abdominal pain,  diarrhea, nausea and vomiting.  Neurological:  Negative for dizziness, light-headedness and headaches.     Physical Exam Triage Vital Signs ED Triage Vitals [11/26/21 1811]  Enc Vitals Group     BP 125/69     Pulse Rate 66     Resp 18     Temp 98.3 F (36.8 C)     Temp Source Oral     SpO2 99 %     Weight      Height      Head Circumference      Peak Flow      Pain Score      Pain Loc  Pain Edu?      Excl. in [place]?    No data found.  Updated Vital Signs BP 125/69 (BP Location: Left Arm)   Pulse 66   Temp 98.3 F (36.8 C) (Oral)   Resp 18   LMP 07/10/2012   SpO2 99%   Visual Acuity Right Eye Distance:   Left Eye Distance:   Bilateral Distance:    Right Eye Near:   Left Eye Near:    Bilateral Near:     Physical Exam Vitals reviewed.  Constitutional:      General: She is awake. She is not in acute distress.    Appearance: Normal appearance. She is well-developed. She is not ill-appearing.     Comments: Very pleasant female appears stated age in no acute distress sitting comfortably in exam room  HENT:     Head: Normocephalic and atraumatic.     Right Ear: Tympanic membrane, ear canal and external ear normal. Tympanic membrane is not erythematous or bulging.     Left Ear: Tympanic membrane, ear canal and external ear normal. Tympanic membrane is not erythematous or bulging.     Nose:     Right Sinus: Maxillary sinus tenderness present. No frontal sinus tenderness.     Left Sinus: Maxillary sinus tenderness present. No frontal sinus tenderness.     Mouth/Throat:     Pharynx: Uvula midline. Posterior oropharyngeal erythema present. No oropharyngeal exudate.     Comments: Erythema and drainage in posterior oropharynx Cardiovascular:     Rate and Rhythm: Normal rate and regular rhythm.     Heart sounds: No murmur heard. Pulmonary:     Effort: Pulmonary effort is normal.     Breath sounds: Normal breath sounds. No wheezing, rhonchi or rales.     Comments:  Clear to auscultation bilaterally Musculoskeletal:     Right lower leg: No edema.     Left lower leg: No edema.  Psychiatric:        Behavior: Behavior is cooperative.      UC Treatments / Results  Labs (all labs ordered are listed, but only abnormal results are displayed) Labs Reviewed - No data to display  EKG   Radiology No results found.  Procedures Procedures (including critical care time)  Medications Ordered in UC Medications - No data to display  Initial Impression / Assessment and Plan / UC Course  I have reviewed the triage vital signs and the nursing notes.  Pertinent labs & imaging results that were available during my care of the patient were reviewed by me and considered in my medical decision making (see chart for details).     No indication for viral testing as patient has been symptomatic for several weeks.  She is well-appearing, afebrile, nontoxic, nontachycardic with no indication for emergent evaluation or imaging.  Given prolonged and worsening symptoms with increased sputum production will cover for secondary bacterial infection with doxycycline given history of allergies to penicillin.  She was instructed to avoid prolonged sun exposure with this medication due to associated photosensitivity.  She is to use over-the-counter medications including Mucinex and Flonase.  Recommended that she rest and drink plenty of fluid.  If her symptoms or not improving within a week she should return for reevaluation.  If she has any worsening symptoms she should be seen immediately.  Strict return precautions given.  Patient declined work excuse note.  Final Clinical Impressions(s) / UC Diagnoses   Final diagnoses:  Sinobronchitis     Discharge Instructions  Take doxycycline 100 mg twice daily for 10 days.  Stay out of the sun while on this medication as it makes you more sensitive to the sun.  Continue over-the-counter medications including Mucinex, Flonase,  Tylenol.  Make sure you rest and drink plenty of fluid.  If your symptoms do not improving by next week return for reevaluation.  If anything worsens please be seen immediately.     ED Prescriptions     Medication Sig Dispense Auth. Provider   doxycycline (VIBRAMYCIN) 100 MG capsule Take 1 capsule (100 mg total) by mouth 2 (two) times daily. 20 capsule Deray Dawes, Derry Skill, PA-C      PDMP not reviewed this encounter.   Terrilee Croak, PA-C 11/26/21 1828

## 2021-11-26 NOTE — ED Triage Notes (Signed)
Pt reports cough for several weeks.  Pt reports sore throat. Pt reports cough up mucous.

## 2021-12-04 ENCOUNTER — Ambulatory Visit: Payer: PPO | Admitting: Internal Medicine

## 2022-01-05 ENCOUNTER — Ambulatory Visit
Admission: EM | Admit: 2022-01-05 | Discharge: 2022-01-05 | Disposition: A | Discharge status: Home / Self Care | Payer: PPO | Attending: Urgent Care | Admitting: Urgent Care

## 2022-01-05 DIAGNOSIS — N760 Acute vaginitis: Secondary | ICD-10-CM | POA: Insufficient documentation

## 2022-01-05 MED ORDER — FLUCONAZOLE 150 MG PO TABS: 150.0000 mg | ORAL_TABLET | ORAL | 0 refills | Status: DC

## 2022-01-05 NOTE — ED Provider Notes (Signed)
[facility]  Note:  This document was prepared using Systems analyst and may include unintentional dictation errors.  MRN: [id number] DOB: [birth year]  Subjective:   Shelby Grant is a 66 y.o. female presenting for 1 week history of persistent vaginal itching, burning and irritation. No history of BV. No concern for STIs, declined STI testing. Denies fever, n/v, abdominal pain, pelvic pain, rashes, dysuria, urinary frequency, hematuria, vaginal discharge.    No current facility-administered medications for this encounter.  Current Outpatient Medications:    Acetaminophen (TYLENOL PO), Take 1,000 mg by mouth daily as needed. , Disp: , Rfl:    alendronate (FOSAMAX) 70 MG tablet, Take 1 tablet (70 mg total) by mouth every 7 (seven) days. Take with a full glass of water on an empty stomach., Disp: 12 tablet, Rfl: 3   ALFALFA PO, Take by mouth., Disp: , Rfl:    b complex vitamins tablet, Take 1 tablet by mouth daily., Disp: , Rfl:    buPROPion (WELLBUTRIN XL) 150 MG 24 hr tablet, TAKE 1 TABLET(150 MG) BY MOUTH DAILY, Disp: 90 tablet, Rfl: 3   Calcium-Magnesium-Vitamin D (CALCIUM MAGNESIUM PO), Take 4 tablets by mouth daily. , Disp: , Rfl:    clonazePAM (KLONOPIN) 1 MG tablet, 1 tab by mouth once daily as needed, Disp: 30 tablet, Rfl: 2   cyanocobalamin 100 MCG tablet, Take 100 mcg by mouth daily., Disp: , Rfl:    doxycycline (VIBRAMYCIN) 100 MG capsule, Take 1 capsule (100 mg total) by mouth 2 (two) times daily., Disp: 20 capsule, Rfl: 0   ibuprofen (ADVIL) 200 MG tablet, Take 200 mg by mouth every 6 (six) hours as needed., Disp: , Rfl:    metoprolol succinate (TOPROL-XL) 25 MG 24 hr tablet, Take 0.5 tablets (12.5 mg total) by mouth 2 (two) times daily., Disp: 90 tablet, Rfl: 3   Multiple Vitamin (MULTIVITAMIN) capsule, Take 1 capsule by mouth daily., Disp: , Rfl:    PREVIDENT 5000 SENSITIVE 1.1-5 % GEL, See admin instructions., Disp: , Rfl:     Probiotic Product (ALIGN) 4 MG CAPS, Take 1 capsule by mouth daily. , Disp: , Rfl:    tretinoin (RETIN-A) 0.025 % cream, Apply topically., Disp: , Rfl:    zinc gluconate 50 MG tablet, Take 50 mg by mouth daily., Disp: , Rfl:    Allergies  Allergen Reactions   Latex     REACTION: rash   Macrobid [Nitrofurantoin Macrocrystal] Swelling and Other (See Comments)    Lips and inside of mouth swelling   Amoxicillin Rash    Has patient had a PCN reaction causing immediate rash, facial/tongue/throat swelling, SOB or lightheadedness with hypotension: unkown Has patient had a PCN reaction causing severe rash involving mucus membranes or skin necrosis: no Has patient had a PCN reaction that required hospitalization No Has patient had a PCN reaction occurring within the last 10 years: unknown If all of the above answers are "NO", then may proceed with Cephalosporin use.    Past Medical History:  Diagnosis Date   ALLERGIC RHINITIS 07/03/2008   Allergy    seasonal   ANXIETY 06/02/2007   Arthritis    BELL'S PALSY, LEFT 12/02/2008   Cancer ([place]) 01/16/2015   kidney   CELLULITIS, LEFT LEG 09/14/2008   CHEST PAIN 08/07/8240   Complication of anesthesia 08-28-2014   "work up in recovery room with jaw locked open"   CORNS AND CALLUSES 07/03/2008   Depression    FOOT PAIN 09/22/2009  History of kidney cancer    IRREGULAR MENSES 06/03/2008   Neoplasm    left renal   OCD (obsessive compulsive disorder)    Renal disorder    Stress fracture of ankle since Jun 29, 2014   left   Supraventricular tachycardia 06/02/2007   Atrial tachycardia with Wenckebach periodicity     Past Surgical History:  Procedure Laterality Date   APPENDECTOMY  20016   BLEPHAROPLASTY Bilateral 10/31/2017   COLONOSCOPY  2009   HIP ARTHROPLASTY Right 11/18/2017   LAPAROSCOPIC APPENDECTOMY N/A 08/28/2014   Procedure: APPENDECTOMY LAPAROSCOPIC;  Surgeon: Judeth Horn, MD;  Location: [facility];  Service: General;  Laterality: N/A;   ROBOTIC  ASSITED PARTIAL NEPHRECTOMY Left 01/06/2015   Procedure: ROBOTIC ASSISTED PARTIAL NEPHRECTOMY;  Surgeon: Raynelle Bring, MD;  Location: [facility];  Service: Urology;  Laterality: Left;   WISDOM TOOTH EXTRACTION      Family History  Problem Relation Age of Onset   Asthma Sister    Colon polyps Sister    Cancer Mother        Unsure where cancer started    Colon cancer Neg Hx    Esophageal cancer Neg Hx    Rectal cancer Neg Hx    Stomach cancer Neg Hx     Social History   Tobacco Use   Smoking status: Never   Smokeless tobacco: Never  Vaping Use   Vaping Use: Never used  Substance Use Topics   Alcohol use: Yes    Alcohol/week: 2.0 standard drinks of alcohol    Types: 1 Glasses of wine, 1 Shots of liquor per week    Comment: social    Drug use: No    ROS   Objective:   Vitals: BP 122/68 (BP Location: Right Arm)   Pulse 96   Temp (!) 97.4 F (36.3 C) (Oral)   Resp 16   LMP 07/10/2012   SpO2 96%   Physical Exam Constitutional:      General: She is not in acute distress.    Appearance: Normal appearance. She is well-developed. She is not ill-appearing, toxic-appearing or diaphoretic.  HENT:     Head: Normocephalic and atraumatic.     Nose: Nose normal.     Mouth/Throat:     Mouth: Mucous membranes are moist.  Eyes:     General: No scleral icterus.       Right eye: No discharge.        Left eye: No discharge.     Extraocular Movements: Extraocular movements intact.  Cardiovascular:     Rate and Rhythm: Normal rate.  Pulmonary:     Effort: Pulmonary effort is normal.  Skin:    General: Skin is warm and dry.  Neurological:     General: No focal deficit present.     Mental Status: She is alert and oriented to person, place, and time.  Psychiatric:        Mood and Affect: Mood normal.        Behavior: Behavior normal.        Thought Content: Thought content normal.        Judgment: Judgment normal.     Assessment and Plan :   PDMP not reviewed this  encounter.  1. Acute vaginitis     Start empiric treatment for yeast vaginitis with oral fluconazole. Labs pending, patient declined STI testing. Counseled patient on potential for adverse effects with medications prescribed/recommended today, ER and return-to-clinic precautions discussed, patient verbalized understanding.  Jaynee Eagles, [place] 01/05/22 [id number]

## 2022-01-05 NOTE — ED Triage Notes (Signed)
Pt c/o vaginal d/c, itching, burning x 1 week-NAD-steady gait

## 2022-01-07 ENCOUNTER — Other Ambulatory Visit: Payer: Self-pay

## 2022-01-07 LAB — CERVICOVAGINAL ANCILLARY ONLY
Bacterial Vaginitis (gardnerella): NEGATIVE
Candida Glabrata: NEGATIVE
Candida Vaginitis: POSITIVE — AB
Comment: NEGATIVE
Comment: NEGATIVE
Comment: NEGATIVE

## 2022-01-07 MED ORDER — ALENDRONATE SODIUM 70 MG PO TABS: 70.0000 mg | ORAL_TABLET | ORAL | 3 refills | Status: DC

## 2022-01-12 ENCOUNTER — Telehealth: Payer: Self-pay

## 2022-01-12 NOTE — Telephone Encounter (Signed)
Patient verification complete (name and date of birth).  Patients call returned about questions on medication prescribed (diflucan). All questions answered.

## 2022-01-21 DIAGNOSIS — S93402A Sprain of unspecified ligament of left ankle, initial encounter: Secondary | ICD-10-CM | POA: Insufficient documentation

## 2022-02-09 DIAGNOSIS — S93402D Sprain of unspecified ligament of left ankle, subsequent encounter: Secondary | ICD-10-CM | POA: Diagnosis not present

## 2022-02-09 DIAGNOSIS — M25572 Pain in left ankle and joints of left foot: Secondary | ICD-10-CM | POA: Diagnosis not present

## 2022-02-16 ENCOUNTER — Ambulatory Visit
Admission: RE | Admit: 2022-02-16 | Discharge: 2022-02-16 | Disposition: A | Discharge status: Home / Self Care | Payer: PPO | Source: Ambulatory Visit | Attending: Urgent Care | Admitting: Urgent Care

## 2022-02-16 VITALS — BP 125/76 | HR 67 | Temp 98.2°F | Resp 18

## 2022-02-16 DIAGNOSIS — R3982 Chronic bladder pain: Secondary | ICD-10-CM | POA: Diagnosis not present

## 2022-02-16 DIAGNOSIS — R350 Frequency of micturition: Secondary | ICD-10-CM | POA: Diagnosis not present

## 2022-02-16 DIAGNOSIS — M6281 Muscle weakness (generalized): Secondary | ICD-10-CM | POA: Diagnosis not present

## 2022-02-16 DIAGNOSIS — B3731 Acute candidiasis of vulva and vagina: Secondary | ICD-10-CM | POA: Diagnosis not present

## 2022-02-16 DIAGNOSIS — M62838 Other muscle spasm: Secondary | ICD-10-CM | POA: Diagnosis not present

## 2022-02-16 DIAGNOSIS — M6289 Other specified disorders of muscle: Secondary | ICD-10-CM | POA: Diagnosis not present

## 2022-02-16 LAB — POCT URINALYSIS DIP (MANUAL ENTRY)
Bilirubin, UA: NEGATIVE
Blood, UA: NEGATIVE
Glucose, UA: NEGATIVE mg/dL
Ketones, POC UA: NEGATIVE mg/dL
Leukocytes, UA: NEGATIVE
Nitrite, UA: NEGATIVE
Protein Ur, POC: NEGATIVE mg/dL
Spec Grav, UA: 1.01 (ref 1.010–1.025)
Urobilinogen, UA: 0.2 E.U./dL
pH, UA: 6.5 (ref 5.0–8.0)

## 2022-02-16 MED ORDER — FLUCONAZOLE 150 MG PO TABS: 150.0000 mg | ORAL_TABLET | ORAL | 0 refills | Status: DC

## 2022-02-16 NOTE — ED Provider Notes (Signed)
[facility]  Note:  This document was prepared using Systems analyst and may include unintentional dictation errors.  MRN: [id number] DOB: 31-May-1955  Subjective:   Shelby Grant is a 67 y.o. female presenting for persistent discharge, vaginal itching, vaginal burning. Had improvement with fluconazole from yeast infection from 01/05/2022. Has urinary frequency. No fever, dysuria, vaginal discharge.   No current facility-administered medications for this encounter.  Current Outpatient Medications:    Acetaminophen (TYLENOL PO), Take 1,000 mg by mouth daily as needed. , Disp: , Rfl:    alendronate (FOSAMAX) 70 MG tablet, Take 1 tablet (70 mg total) by mouth every 7 (seven) days. Take with a full glass of water on an empty stomach., Disp: 12 tablet, Rfl: 3   ALFALFA PO, Take by mouth., Disp: , Rfl:    b complex vitamins tablet, Take 1 tablet by mouth daily., Disp: , Rfl:    buPROPion (WELLBUTRIN XL) 150 MG 24 hr tablet, TAKE 1 TABLET(150 MG) BY MOUTH DAILY, Disp: 90 tablet, Rfl: 3   Calcium-Magnesium-Vitamin D (CALCIUM MAGNESIUM PO), Take 4 tablets by mouth daily. , Disp: , Rfl:    clonazePAM (KLONOPIN) 1 MG tablet, 1 tab by mouth once daily as needed, Disp: 30 tablet, Rfl: 2   cyanocobalamin 100 MCG tablet, Take 100 mcg by mouth daily., Disp: , Rfl:    doxycycline (VIBRAMYCIN) 100 MG capsule, Take 1 capsule (100 mg total) by mouth 2 (two) times daily., Disp: 20 capsule, Rfl: 0   fluconazole (DIFLUCAN) 150 MG tablet, Take 1 tablet (150 mg total) by mouth once a week., Disp: 2 tablet, Rfl: 0   ibuprofen (ADVIL) 200 MG tablet, Take 200 mg by mouth every 6 (six) hours as needed., Disp: , Rfl:    metoprolol succinate (TOPROL-XL) 25 MG 24 hr tablet, Take 0.5 tablets (12.5 mg total) by mouth 2 (two) times daily., Disp: 90 tablet, Rfl: 3   Multiple Vitamin (MULTIVITAMIN) capsule, Take 1 capsule by mouth daily., Disp: , Rfl:    PREVIDENT 5000 SENSITIVE  1.1-5 % GEL, See admin instructions., Disp: , Rfl:    Probiotic Product (ALIGN) 4 MG CAPS, Take 1 capsule by mouth daily. , Disp: , Rfl:    tretinoin (RETIN-A) 0.025 % cream, Apply topically., Disp: , Rfl:    zinc gluconate 50 MG tablet, Take 50 mg by mouth daily., Disp: , Rfl:    Allergies  Allergen Reactions   Latex     REACTION: rash   Macrobid [Nitrofurantoin Macrocrystal] Swelling and Other (See Comments)    Lips and inside of mouth swelling   Amoxicillin Rash    Has patient had a PCN reaction causing immediate rash, facial/tongue/throat swelling, SOB or lightheadedness with hypotension: unkown Has patient had a PCN reaction causing severe rash involving mucus membranes or skin necrosis: no Has patient had a PCN reaction that required hospitalization No Has patient had a PCN reaction occurring within the last 10 years: unknown If all of the above answers are "NO", then may proceed with Cephalosporin use.    Past Medical History:  Diagnosis Date   ALLERGIC RHINITIS 07/03/2008   Allergy    seasonal   ANXIETY 06/02/2007   Arthritis    BELL'S PALSY, LEFT 12/02/2008   Cancer ([place]) 01/16/2015   kidney   CELLULITIS, LEFT LEG 09/14/2008   CHEST PAIN 08/06/6431   Complication of anesthesia 08-28-2014   "work up in recovery room with jaw locked open"   CORNS AND CALLUSES  07/03/2008   Depression    FOOT PAIN 09/22/2009   History of kidney cancer    IRREGULAR MENSES 06/03/2008   Neoplasm    left renal   OCD (obsessive compulsive disorder)    Renal disorder    Stress fracture of ankle since Jun 29, 2014   left   Supraventricular tachycardia 06/02/2007   Atrial tachycardia with Wenckebach periodicity     Past Surgical History:  Procedure Laterality Date   APPENDECTOMY  20016   BLEPHAROPLASTY Bilateral 10/31/2017   COLONOSCOPY  2009   HIP ARTHROPLASTY Right 11/18/2017   LAPAROSCOPIC APPENDECTOMY N/A 08/28/2014   Procedure: APPENDECTOMY LAPAROSCOPIC;  Surgeon: Judeth Horn, MD;  Location:  [facility];  Service: General;  Laterality: N/A;   ROBOTIC ASSITED PARTIAL NEPHRECTOMY Left 01/06/2015   Procedure: ROBOTIC ASSISTED PARTIAL NEPHRECTOMY;  Surgeon: Raynelle Bring, MD;  Location: [facility];  Service: Urology;  Laterality: Left;   WISDOM TOOTH EXTRACTION      Family History  Problem Relation Age of Onset   Asthma Sister    Colon polyps Sister    Cancer Mother        Unsure where cancer started    Colon cancer Neg Hx    Esophageal cancer Neg Hx    Rectal cancer Neg Hx    Stomach cancer Neg Hx     Social History   Tobacco Use   Smoking status: Never   Smokeless tobacco: Never  Vaping Use   Vaping Use: Never used  Substance Use Topics   Alcohol use: Yes    Alcohol/week: 2.0 standard drinks of alcohol    Types: 1 Glasses of wine, 1 Shots of liquor per week    Comment: social    Drug use: No    ROS   Objective:   Vitals: BP 125/76 (BP Location: Right Arm)   Pulse 67   Temp 98.2 F (36.8 C) (Oral)   Resp 18   LMP 07/10/2012   SpO2 98%   Physical Exam Constitutional:      General: She is not in acute distress.    Appearance: Normal appearance. She is well-developed. She is not ill-appearing, toxic-appearing or diaphoretic.  HENT:     Head: Normocephalic and atraumatic.     Nose: Nose normal.     Mouth/Throat:     Mouth: Mucous membranes are moist.  Eyes:     General: No scleral icterus.       Right eye: No discharge.        Left eye: No discharge.     Extraocular Movements: Extraocular movements intact.     Conjunctiva/sclera: Conjunctivae normal.  Cardiovascular:     Rate and Rhythm: Normal rate.  Pulmonary:     Effort: Pulmonary effort is normal.  Abdominal:     General: Bowel sounds are normal. There is no distension.     Palpations: Abdomen is soft. There is no mass.     Tenderness: There is no abdominal tenderness. There is no right CVA tenderness, left CVA tenderness, guarding or rebound.  Skin:    General: Skin is warm and dry.   Neurological:     General: No focal deficit present.     Mental Status: She is alert and oriented to person, place, and time.  Psychiatric:        Mood and Affect: Mood normal.        Behavior: Behavior normal.        Thought Content: Thought content normal.  Judgment: Judgment normal.     Results for orders placed or performed during the hospital encounter of 02/16/22 (from the past 24 hour(s))  POCT urinalysis dipstick     Status: None   Collection Time: 02/16/22  7:22 PM  Result Value Ref Range   Color, UA yellow yellow   Clarity, UA clear clear   Glucose, UA negative negative mg/dL   Bilirubin, UA negative negative   Ketones, POC UA negative negative mg/dL   Spec Grav, UA 1.010 1.010 - 1.025   Blood, UA negative negative   pH, UA 6.5 5.0 - 8.0   Protein Ur, POC negative negative mg/dL   Urobilinogen, UA 0.2 0.2 or 1.0 E.U./dL   Nitrite, UA Negative Negative   Leukocytes, UA Negative Negative    Assessment and Plan :   PDMP not reviewed this encounter.  1. Yeast vaginitis     Will restart oral fluconazole, will use a longer course. Follow up with a gynecologist. Counseled patient on potential for adverse effects with medications prescribed/recommended today, ER and return-to-clinic precautions discussed, patient verbalized understanding.    Jaynee Eagles, [place] 02/16/22 1927

## 2022-02-16 NOTE — ED Triage Notes (Signed)
Pt c/o vaginal burning, irritation and discharge.

## 2022-02-17 LAB — CERVICOVAGINAL ANCILLARY ONLY
Bacterial Vaginitis (gardnerella): NEGATIVE
Candida Glabrata: NEGATIVE
Candida Vaginitis: NEGATIVE
Chlamydia: NEGATIVE
Comment: NEGATIVE
Comment: NEGATIVE
Comment: NEGATIVE
Comment: NEGATIVE
Comment: NEGATIVE
Comment: NORMAL
Neisseria Gonorrhea: NEGATIVE
Trichomonas: NEGATIVE

## 2022-02-22 DIAGNOSIS — S93402A Sprain of unspecified ligament of left ankle, initial encounter: Secondary | ICD-10-CM | POA: Diagnosis not present

## 2022-02-23 DIAGNOSIS — M6281 Muscle weakness (generalized): Secondary | ICD-10-CM | POA: Diagnosis not present

## 2022-02-23 DIAGNOSIS — R102 Pelvic and perineal pain: Secondary | ICD-10-CM | POA: Diagnosis not present

## 2022-02-23 DIAGNOSIS — M25552 Pain in left hip: Secondary | ICD-10-CM | POA: Diagnosis not present

## 2022-02-23 DIAGNOSIS — M6289 Other specified disorders of muscle: Secondary | ICD-10-CM | POA: Diagnosis not present

## 2022-02-23 DIAGNOSIS — M62838 Other muscle spasm: Secondary | ICD-10-CM | POA: Diagnosis not present

## 2022-03-09 DIAGNOSIS — Z4802 Encounter for removal of sutures: Secondary | ICD-10-CM | POA: Diagnosis not present

## 2022-03-09 DIAGNOSIS — D225 Melanocytic nevi of trunk: Secondary | ICD-10-CM | POA: Diagnosis not present

## 2022-03-09 DIAGNOSIS — L821 Other seborrheic keratosis: Secondary | ICD-10-CM | POA: Diagnosis not present

## 2022-03-09 DIAGNOSIS — Z85828 Personal history of other malignant neoplasm of skin: Secondary | ICD-10-CM | POA: Diagnosis not present

## 2022-03-09 DIAGNOSIS — L905 Scar conditions and fibrosis of skin: Secondary | ICD-10-CM | POA: Diagnosis not present

## 2022-03-09 DIAGNOSIS — L814 Other melanin hyperpigmentation: Secondary | ICD-10-CM | POA: Diagnosis not present

## 2022-03-11 DIAGNOSIS — S63501A Unspecified sprain of right wrist, initial encounter: Secondary | ICD-10-CM | POA: Diagnosis not present

## 2022-03-11 DIAGNOSIS — M25532 Pain in left wrist: Secondary | ICD-10-CM | POA: Diagnosis not present

## 2022-03-25 DIAGNOSIS — S93402A Sprain of unspecified ligament of left ankle, initial encounter: Secondary | ICD-10-CM | POA: Diagnosis not present

## 2022-03-30 ENCOUNTER — Other Ambulatory Visit (HOSPITAL_COMMUNITY)
Admission: RE | Admit: 2022-03-30 | Discharge: 2022-03-30 | Disposition: A | Discharge status: Home / Self Care | Payer: PPO | Source: Ambulatory Visit | Attending: Obstetrics and Gynecology | Admitting: Obstetrics and Gynecology

## 2022-03-30 ENCOUNTER — Encounter: Payer: Self-pay | Admitting: Obstetrics and Gynecology

## 2022-03-30 ENCOUNTER — Ambulatory Visit (INDEPENDENT_AMBULATORY_CARE_PROVIDER_SITE_OTHER): Payer: PPO | Admitting: Obstetrics and Gynecology

## 2022-03-30 VITALS — BP 117/77 | HR 73

## 2022-03-30 DIAGNOSIS — M62838 Other muscle spasm: Secondary | ICD-10-CM | POA: Diagnosis not present

## 2022-03-30 DIAGNOSIS — Z01419 Encounter for gynecological examination (general) (routine) without abnormal findings: Secondary | ICD-10-CM | POA: Insufficient documentation

## 2022-03-30 DIAGNOSIS — Z124 Encounter for screening for malignant neoplasm of cervix: Secondary | ICD-10-CM

## 2022-03-30 DIAGNOSIS — Z1151 Encounter for screening for human papillomavirus (HPV): Secondary | ICD-10-CM | POA: Diagnosis not present

## 2022-03-30 DIAGNOSIS — M6289 Other specified disorders of muscle: Secondary | ICD-10-CM | POA: Diagnosis not present

## 2022-03-30 DIAGNOSIS — R102 Pelvic and perineal pain: Secondary | ICD-10-CM | POA: Diagnosis not present

## 2022-03-30 DIAGNOSIS — M6281 Muscle weakness (generalized): Secondary | ICD-10-CM | POA: Diagnosis not present

## 2022-03-30 DIAGNOSIS — R350 Frequency of micturition: Secondary | ICD-10-CM | POA: Diagnosis not present

## 2022-03-30 NOTE — Progress Notes (Signed)
ANNUAL EXAM Patient name: ARIENNA Grant MRN [id number]  Date of birth: 1956-01-09 Chief Complaint:   Gynecologic Exam  History of Present Illness:   Shelby Grant is a 67 y.o. No obstetric history on file. being seen today for a routine annual exam.  Current complaints: desire to resume/improve sexual activity  Menstrual concerns? No   Changes urinary habits? No  Changes in bowel habits? No  Contraception use? No  Sexually active? Yes - "trying to be", currently working with a PFPT, restarted estradiol cream. Husband has upcoming visit to discuss penile implants.   Also has question regarding DEXA scan results/trend.   Patient's last menstrual period was 07/10/2012.   The pregnancy intention screening data noted above was reviewed. Potential methods of contraception were discussed. The patient elected to proceed with No data recorded.   Last pap     Component Value Date/Time   DIAGPAP  03/29/2017 0000    NEGATIVE FOR INTRAEPITHELIAL LESIONS OR MALIGNANCY.   ADEQPAP  03/29/2017 0000    Satisfactory for evaluation. The presence or absence of an endocervical / transformation zone component cannot be determined because of atrophy.    H/O abnormal pap: no Last mammogram: 09/2020. BIRADS 1 Last colonoscopy: 07/2019 polyps, repeat 5 years DEXA: 09/2020 Osteoporosis      03/30/2022    3:31 PM 09/29/2021    8:13 AM 09/26/2020    8:37 AM 09/26/2020    8:12 AM 09/18/2019    1:38 PM  Depression screen PHQ 2/9  Decreased Interest 0 0 0 0 0  Down, Depressed, Hopeless 0 0 0 0 0  PHQ - 2 Score 0 0 0 0 0  Altered sleeping 0      Tired, decreased energy 0      Change in appetite 0      Feeling bad or failure about yourself  0      Trouble concentrating 0      Moving slowly or fidgety/restless 0      Suicidal thoughts 0      PHQ-9 Score 0            03/30/2022    3:31 PM  GAD 7 : Generalized Anxiety Score  Nervous, Anxious, on Edge 0  Control/stop worrying 0  Worry too much -  different things 0  Trouble relaxing 0  Restless 0  Easily annoyed or irritable 0  Afraid - awful might happen 0  Total GAD 7 Score 0     Review of Systems:   Pertinent items are noted in HPI Denies any headaches, blurred vision, fatigue, shortness of breath, chest pain, abdominal pain, abnormal vaginal discharge/itching/odor/irritation, problems with periods, bowel movements, urination, or intercourse unless otherwise stated above. Pertinent History Reviewed:  Reviewed past medical,surgical, social and family history.  Reviewed problem list, medications and allergies. Physical Assessment:   Vitals:   03/30/22 1527  BP: 117/77  Pulse: 73  There is no height or weight on file to calculate BMI.        Physical Examination:   General appearance - well appearing, and in no distress  Mental status - alert, oriented to person, place, and time  Psych:  She has a normal mood and affect  Skin - warm and dry, normal color, no suspicious lesions noted  Chest - effort normal, all lung fields clear to auscultation bilaterally  Pelvic -  VULVA: normal appearing vulva with no masses, tenderness or lesions   VAGINA: normal appearing vagina with normal  color and discharge, no lesions   CERVIX: normal appearing cervix without discharge or lesions, no CMT  Thin prep pap is done with HR HPV cotesting  UTERUS: uterus is felt to be normal size, shape, consistency and nontender   ADNEXA: No adnexal masses or tenderness noted.  Extremities:  No swelling or varicosities noted  Chaperone present for exam  No results found for this or any previous visit (from the past 24 hour(s)).    Assessment & Plan:  1. Screening for malignant neoplasm of cervix Routine pap, if normal, no additional paps going forward - Cytology - PAP( [place])  2. Well woman exam with routine gynecological exam Reviewed sexual health practices, continue use of vaginal estrogen and PFPT. Encouraged use of vaginal  lubrication with intercourse. Engage in extended foreplay when able prior to penetration. Reviewed pathophysiologic vaginal changes associated with arousal. Reviewed DEXA scan reports and note of osteoporosis diagnosis and appears to have worsening score - recommend follow up with PCP as treatment may need to be adjusted. All questions answered   No orders of the defined types were placed in this encounter.   Meds: No orders of the defined types were placed in this encounter.   Follow-up: No follow-ups on file.  Darliss Cheney, MD 03/30/2022 5:56 PM

## 2022-04-05 LAB — CYTOLOGY - PAP
Comment: NEGATIVE
Diagnosis: NEGATIVE
Diagnosis: REACTIVE
High risk HPV: NEGATIVE

## 2022-05-26 ENCOUNTER — Emergency Department (HOSPITAL_COMMUNITY)
Admission: EM | Admit: 2022-05-26 | Discharge: 2022-05-26 | Disposition: A | Discharge status: Home / Self Care | Payer: PPO | Attending: Emergency Medicine | Admitting: Emergency Medicine

## 2022-05-26 ENCOUNTER — Other Ambulatory Visit: Payer: Self-pay

## 2022-05-26 ENCOUNTER — Encounter (HOSPITAL_COMMUNITY): Payer: Self-pay

## 2022-05-26 ENCOUNTER — Emergency Department (HOSPITAL_COMMUNITY): Payer: PPO

## 2022-05-26 DIAGNOSIS — R002 Palpitations: Secondary | ICD-10-CM | POA: Insufficient documentation

## 2022-05-26 DIAGNOSIS — R079 Chest pain, unspecified: Secondary | ICD-10-CM | POA: Diagnosis not present

## 2022-05-26 DIAGNOSIS — Z85528 Personal history of other malignant neoplasm of kidney: Secondary | ICD-10-CM | POA: Insufficient documentation

## 2022-05-26 DIAGNOSIS — Z91040 Latex allergy status: Secondary | ICD-10-CM | POA: Insufficient documentation

## 2022-05-26 DIAGNOSIS — R778 Other specified abnormalities of plasma proteins: Secondary | ICD-10-CM | POA: Diagnosis not present

## 2022-05-26 DIAGNOSIS — R7989 Other specified abnormal findings of blood chemistry: Secondary | ICD-10-CM | POA: Insufficient documentation

## 2022-05-26 DIAGNOSIS — R000 Tachycardia, unspecified: Secondary | ICD-10-CM | POA: Diagnosis not present

## 2022-05-26 LAB — CBC WITH DIFFERENTIAL/PLATELET
Abs Immature Granulocytes: 0.01 K/uL (ref 0.00–0.07)
Basophils Absolute: 0.1 K/uL (ref 0.0–0.1)
Basophils Relative: 1 %
Eosinophils Absolute: 0.2 K/uL (ref 0.0–0.5)
Eosinophils Relative: 3 %
HCT: 41.5 % (ref 36.0–46.0)
Hemoglobin: 14.1 g/dL (ref 12.0–15.0)
Immature Granulocytes: 0 %
Lymphocytes Relative: 35 %
Lymphs Abs: 2.4 K/uL (ref 0.7–4.0)
MCH: 31.1 pg (ref 26.0–34.0)
MCHC: 34 g/dL (ref 30.0–36.0)
MCV: 91.4 fL (ref 80.0–100.0)
Monocytes Absolute: 0.5 K/uL (ref 0.1–1.0)
Monocytes Relative: 8 %
Neutro Abs: 3.7 K/uL (ref 1.7–7.7)
Neutrophils Relative %: 53 %
Platelets: 251 K/uL (ref 150–400)
RBC: 4.54 MIL/uL (ref 3.87–5.11)
RDW: 13.2 % (ref 11.5–15.5)
WBC: 6.9 K/uL (ref 4.0–10.5)
nRBC: 0 % (ref 0.0–0.2)

## 2022-05-26 LAB — BASIC METABOLIC PANEL WITH GFR
Anion gap: 7 (ref 5–15)
BUN: 14 mg/dL (ref 8–23)
CO2: 28 mmol/L (ref 22–32)
Calcium: 9.2 mg/dL (ref 8.9–10.3)
Chloride: 107 mmol/L (ref 98–111)
Creatinine, Ser: 0.73 mg/dL (ref 0.44–1.00)
GFR, Estimated: >60 mL/min
Glucose, Bld: 93 mg/dL (ref 70–99)
Potassium: 3.9 mmol/L (ref 3.5–5.1)
Sodium: 142 mmol/L (ref 135–145)

## 2022-05-26 LAB — TROPONIN I (HIGH SENSITIVITY)
Troponin I (High Sensitivity): 58 ng/L — ABNORMAL HIGH (ref <18)
Troponin I (High Sensitivity): 51 ng/L — ABNORMAL HIGH (ref <18)

## 2022-05-26 LAB — MAGNESIUM: Magnesium: 2.2 mg/dL (ref 1.7–2.4)

## 2022-05-26 LAB — D-DIMER, QUANTITATIVE: D-Dimer, Quant: 0.36 ug/mL-FEU (ref 0.00–0.50)

## 2022-05-26 LAB — TSH: TSH: 1.65 u[IU]/mL (ref 0.350–4.500)

## 2022-05-26 NOTE — ED Provider Triage Note (Signed)
Emergency Medicine Provider Triage Evaluation Note  Shelby Grant , a 67 y.o. female  was evaluated in triage.  Pt complains of palpitations, known history of SVT took  metop without improvement.  No chest pain during or after. Pulse 140s. Now back to baseline.  Review of Systems  Positive: Palpitations.  Negative:   Physical Exam  Ht 5' 6.5" (1.689 m)   Wt 60.3 kg   LMP 07/10/2012   BMI 21.15 kg/m  Gen:   Awake, no distress   Resp:  Normal effort  MSK:   Moves extremities without difficulty  Other:    Medical Decision Making  Medically screening exam initiated at 1:24 PM.  Appropriate orders placed.  LONETTE STEVISON was informed that the remainder of the evaluation will be completed by another provider, this initial triage assessment does not replace that evaluation, and the importance of remaining in the ED until their evaluation is complete.     Glyn Ade, MD 05/26/22 1327

## 2022-05-26 NOTE — ED Provider Notes (Signed)
[place] EMERGENCY DEPARTMENT AT [facility] Provider Note   CSN: [id number] Arrival date & time: 05/26/22  1318     History  Chief Complaint  Patient presents with   Tachycardia    HIEDI TOUCHTON is a 67 y.o. female SVT, GAD, status post partial nephrectomy due to renal cell cancer presented with high heart rate with palpitations that began this morning.  Patient states that she normally is able to convert her self out of SVT however it was persistent since 8 AM this morning and resolved when she arrived at the ED at 1400.  Patient tried metoprolol at home but was unable to convert.  Patient states that she was given fluids by EMS her heart rate came down and she converted.  Patient denies any palpitations now or new medications.  Patient does state that she has recent travel to [place] in the past 2 weeks and has a history of renal cell carcinoma.  Patient states she has not seen cardiology in some years as she is normally able to convert.  Patient denies any chest pain, shortness of breath, leg edema, hemoptysis, estrogen use, LOC, headache, vision changes  Home Medications Prior to Admission medications   Medication Sig Start Date End Date Taking? Authorizing Provider  Acetaminophen (TYLENOL PO) Take 1,000 mg by mouth daily as needed.     [provider]  alendronate (FOSAMAX) 70 MG tablet Take 1 tablet (70 mg total) by mouth every 7 (seven) days. Take with a full glass of water on an empty stomach. 01/07/22   Corwin Levins, MD  ALFALFA PO Take by mouth.    [provider]  b complex vitamins tablet Take 1 tablet by mouth daily.    [provider]  buPROPion (WELLBUTRIN XL) 150 MG 24 hr tablet TAKE 1 TABLET(150 MG) BY MOUTH DAILY Patient not taking: Reported on 03/30/2022 11/04/21   Corwin Levins, MD  Calcium-Magnesium-Vitamin D (CALCIUM MAGNESIUM PO) Take 4 tablets by mouth daily.     [provider]  clonazePAM Scarlette Calico) 1 MG tablet 1  tab by mouth once daily as needed 07/07/21   Corwin Levins, MD  cyanocobalamin 100 MCG tablet Take 100 mcg by mouth daily.    [provider]  doxycycline (VIBRAMYCIN) 100 MG capsule Take 1 capsule (100 mg total) by mouth 2 (two) times daily. Patient not taking: Reported on 03/30/2022 11/26/21   Raspet, Noberto Retort, PA-C  fluconazole (DIFLUCAN) 150 MG tablet Take 1 tablet (150 mg total) by mouth once a week. 02/16/22   Wallis Bamberg, PA-C  ibuprofen (ADVIL) 200 MG tablet Take 200 mg by mouth every 6 (six) hours as needed.    [provider]  metoprolol succinate (TOPROL-XL) 25 MG 24 hr tablet Take 0.5 tablets (12.5 mg total) by mouth 2 (two) times daily. 10/14/21   Corwin Levins, MD  Multiple Vitamin (MULTIVITAMIN) capsule Take 1 capsule by mouth daily.    [provider]  PREVIDENT 5000 SENSITIVE 1.1-5 % GEL See admin instructions. 03/10/21   [provider]  Probiotic Product (ALIGN) 4 MG CAPS Take 1 capsule by mouth daily.  Patient not taking: Reported on 03/30/2022    [provider]  tretinoin (RETIN-A) 0.025 % cream Apply topically. Patient not taking: Reported on 03/30/2022 03/04/21   [provider]  zinc gluconate 50 MG tablet Take 50 mg by mouth daily.    [provider]  estrogen, conjugated,-medroxyprogesterone (PREMPRO) 0.625-2.5 MG per tablet  Take 1 tablet by mouth daily. 02/11/11 03/09/17  Roderick Pee, MD      Allergies    Latex, Macrobid [nitrofurantoin macrocrystal], and Amoxicillin    Review of Systems   Review of Systems See HPI Physical Exam Updated Vital Signs BP 116/69 (BP Location: Right Arm)   Pulse 63   Temp 98.2 F (36.8 C) (Oral)   Resp 17   Ht 5' 6.5" (1.689 m)   Wt 60.3 kg   LMP 07/10/2012   SpO2 98%   BMI 21.15 kg/m  Physical Exam Vitals reviewed.  Constitutional:      General: She is not in acute distress. HENT:     Head: Normocephalic and atraumatic.  Eyes:     Extraocular Movements: Extraocular  movements intact.     Conjunctiva/sclera: Conjunctivae normal.     Pupils: Pupils are equal, round, and reactive to light.  Cardiovascular:     Rate and Rhythm: Normal rate and regular rhythm.     Pulses: Normal pulses.     Heart sounds: Normal heart sounds.     Comments: 2+ bilateral radial/dorsalis pedis pulses with regular rate Pulmonary:     Effort: Pulmonary effort is normal. No respiratory distress.     Breath sounds: Normal breath sounds.  Abdominal:     Palpations: Abdomen is soft.     Tenderness: There is no abdominal tenderness. There is no guarding or rebound.  Musculoskeletal:        General: Normal range of motion.     Cervical back: Normal range of motion and neck supple.     Comments: 5 out of 5 bilateral grip/leg extension strength  Skin:    General: Skin is warm and dry.     Capillary Refill: Capillary refill takes less than 2 seconds.  Neurological:     General: No focal deficit present.     Mental Status: She is alert and oriented to person, place, and time.     Comments: Sensation intact in all 4 limbs  Psychiatric:        Mood and Affect: Mood normal.     ED Results / Procedures / Treatments   Labs (all labs ordered are listed, but only abnormal results are displayed) Labs Reviewed  TROPONIN I (HIGH SENSITIVITY) - Abnormal; Notable for the following components:      Result Value   Troponin I (High Sensitivity) 58 (*)    All other components within normal limits  TROPONIN I (HIGH SENSITIVITY) - Abnormal; Notable for the following components:   Troponin I (High Sensitivity) 51 (*)    All other components within normal limits  CBC WITH DIFFERENTIAL/PLATELET  BASIC METABOLIC PANEL  TSH  MAGNESIUM  D-DIMER, QUANTITATIVE    EKG EKG Interpretation  Date/Time:  Wednesday May 26 2022 18:07:55 EDT Ventricular Rate:  61 PR Interval:  138 QRS Duration: 76 QT Interval:  418 QTC Calculation: 420 R Axis:   72 Text Interpretation: Normal sinus rhythm  Normal ECG When compared with ECG of 26-May-2022 13:14, PREVIOUS ECG IS PRESENT Confirmed by Linwood Dibbles [phone]) on 05/26/2022 6:39:36 PM  Radiology DG Chest 1 View  Result Date: 05/26/2022 CLINICAL DATA:  Pain. EXAM: CHEST  1 VIEW COMPARISON:  10/15/2021. FINDINGS: Clear lungs. Normal heart size and mediastinal contours. No pleural effusion or pneumothorax. Visualized bones and upper abdomen are unremarkable. IMPRESSION: No evidence of acute cardiopulmonary disease. Electronically Signed   By: Orvan Falconer M.D.   On: 05/26/2022 14:35    Procedures  Procedures    Medications Ordered in ED Medications - No data to display  ED Course/ Medical Decision Making/ A&P                             Medical Decision Making Amount and/or Complexity of Data Reviewed Labs: ordered.   Dejuana Weist Stupka 67 y.o. presented today for palpitations and tachycardia. Working DDx that I considered at this time includes, but not limited to, SVT, PE, viral illness, ACS, thyroid abnormality, dehydration.  R/o DDx: SVT, PE, viral illness, ACS, thyroid abnormality, dehydration: These are considered less likely due to history of present illness and physical exam findings  Review of prior external notes: 02/16/2022 ED  Unique Tests and My Interpretation:  Magnesium: Unremarkable BMP: Unremarkable CBC: Unremarkable TSH: Unremarkable Troponin: 58, 51 D-dimer: Negative EKG: Sinus 61 bpm, no ST abnormalities or blocks noted Chest x-ray: No acute cardiopulmonary changes  Discussion with Independent Historian:  Husband  Discussion of Management of Tests: None  Risk: Low: based on diagnostic testing/clinical impression and treatment plan  Risk Stratification Score: None  Staffed with Lynelle Doctor, MD  Plan: Patient presented for tachycardia and palpitations. On exam patient was no acute distress had stable vitals.  Patient states this morning her heart rate was in the 140s/150s and that she was unable to  cardiovert herself with her usual vagal maneuvers and metoprolol.  Patient states when she arrived to the ED her heart rate normalized and she cardioverted after receiving IV fluids from EMS.  Patient does endorse history of renal cell cancer along with recent travel to [place] and so a D-dimer was ordered to evaluate for possible blood clot causing patient's tachycardia.  Labs and imaging will be ordered.  Patient stable at this time.  Patient troponin came back elevated at 58 however there are no previous troponins to compare this to and so we will wait the second troponin.  Patient D-dimer was negative and so a CTA of her chest will not be ordered at this time.  Patient was updated of this finding and all of her questions were answered to her satisfaction.  Pending second troponin patient may be discharged with cardiology follow-up as she has a long history of SVT and does not see cardiology.  Patient stable at this time.  Patient's troponin came back at 51 which is less than the initial 1.  I had a suspect patient's elevated troponin is due to demand ischemia with a high heart rate at home as opposed to ACS or other cardiogenic causes at this time.  Patient has been asymptomatic throughout ED course with stable vitals and will be discharged with cardiology follow-up.  I talked the patient about these findings and to continue taking her medications as prescribed.  Educated the patient on monitoring symptoms and if symptoms worsen to return to the ER.  Patient was given return precautions. Patient stable for discharge at this time.  Patient verbalized understanding of plan.         Final Clinical Impression(s) / ED Diagnoses Final diagnoses:  Elevated troponin  Palpitations    Rx / DC Orders ED Discharge Orders          Ordered    Ambulatory referral to Cardiology       Comments: If you have not heard from the Cardiology office within the next 72 hours please call [phone].   05/26/22  2249  Remi Deter 05/26/22 2254    Linwood Dibbles, MD 05/27/22 [phone]

## 2022-05-26 NOTE — Discharge Instructions (Addendum)
You should receive a phone call the neck 72 hours from the cardiologist office regarding recent symptoms and ER visit.  Today your labs show that you have an elevated troponin that ultimately came down most likely due to your heart rate being high at home.  Your labs and imaging have been reassuring.  We are discharging you with outpatient follow-up.  Please continue take your medications as prescribed and follow-up with a cardiologist.  Please monitor your symptoms and if symptoms worsen please return to ER.

## 2022-05-26 NOTE — ED Triage Notes (Signed)
EMS called for high HR 140 regular.  Complains of dizziness and fluttering in chest.  NS 500cc given to patient and now NSR 73.  BP 126/89 CBG 95 100%RA  18g LAC

## 2022-06-01 ENCOUNTER — Ambulatory Visit (INDEPENDENT_AMBULATORY_CARE_PROVIDER_SITE_OTHER): Payer: PPO | Admitting: Internal Medicine

## 2022-06-01 ENCOUNTER — Encounter: Payer: Self-pay | Admitting: Internal Medicine

## 2022-06-01 ENCOUNTER — Telehealth: Payer: Self-pay

## 2022-06-01 VITALS — BP 122/78 | HR 65 | Temp 98.8°F | Ht 66.5 in | Wt 136.0 lb

## 2022-06-01 DIAGNOSIS — F411 Generalized anxiety disorder: Secondary | ICD-10-CM

## 2022-06-01 DIAGNOSIS — I471 Supraventricular tachycardia, unspecified: Secondary | ICD-10-CM | POA: Diagnosis not present

## 2022-06-01 DIAGNOSIS — E7800 Pure hypercholesterolemia, unspecified: Secondary | ICD-10-CM

## 2022-06-01 DIAGNOSIS — Z0001 Encounter for general adult medical examination with abnormal findings: Secondary | ICD-10-CM | POA: Diagnosis not present

## 2022-06-01 DIAGNOSIS — R079 Chest pain, unspecified: Secondary | ICD-10-CM | POA: Diagnosis not present

## 2022-06-01 MED ORDER — CLONAZEPAM 1 MG PO TABS: ORAL_TABLET | ORAL | 2 refills | Status: DC

## 2022-06-01 MED ORDER — BUPROPION HCL ER (XL) 300 MG PO TB24: 300.0000 mg | ORAL_TABLET | Freq: Every day | ORAL | 3 refills | Status: DC

## 2022-06-01 MED ORDER — METOPROLOL TARTRATE 50 MG PO TABS: 50.0000 mg | ORAL_TABLET | Freq: Two times a day (BID) | ORAL | 3 refills | Status: DC | PRN

## 2022-06-01 NOTE — Progress Notes (Unsigned)
Patient ID: Shelby Grant, female   DOB: Jul 05, 1955, 67 y.o.   MRN: [id number]         Chief Complaint:: wellness exam and svt, chest pain, anxiety       HPI:  Shelby Grant is a 67 y.o. female here for wellness exam; declines covid booster, for shingrix at pharmacy, o/w up to date                        Also Pt denies increased sob or doe, wheezing, orthopnea, PND, increased LE swelling, dizziness or syncope, but has had intermittent mild heaviness chest pressure, seen at ED recently with mild elevated troponin but not felt to be acute syndrome. Also with intermittent palpitations likley recurring SVT more increased frequency recently, usually able to control with vasovagal maneuvers at home and prn low dose BB on top of daily BB.    Pt states she recently developed tinnitus and though maybe due to wellbutrin, so has stopped x 2 mo but tinnitus persists.  Denies worsening depressive symptoms, suicidal ideation, or panic; has ongoing anxiety, now increased recently.  Willing to restart wellbutrin as this did help.  Did have 2023 recent CT cardiac score of Zero.  Has new bilateral hearing aids and help quite a bit.   Wt Readings from Last 3 Encounters:  06/01/22 136 lb (61.7 kg)  05/26/22 133 lb (60.3 kg)  10/15/21 133 lb (60.3 kg)   BP Readings from Last 3 Encounters:  06/01/22 122/78  05/26/22 116/69  03/30/22 117/77   Immunization History  Administered Date(s) Administered   Influenza Whole 11/07/2007, 12/02/2008, 10/29/2009, 11/10/2011   Influenza, High Dose Seasonal PF 09/11/2021   Influenza, Seasonal, Injecte, Preservative Fre 11/25/2016   Influenza,inj,Quad PF,6+ Mos 10/10/2012, 10/15/2014, 11/04/2017, 12/15/2018   Influenza,inj,quad, With Preservative 12/02/2016   Influenza-Unspecified 11/02/2013, 11/02/2014, 09/13/2015, 10/03/2015   PFIZER(Purple Top)SARS-COV-2 Vaccination 04/27/2019, 05/22/2019   PNEUMOCOCCAL CONJUGATE-20 09/29/2021   Td 11/03/1998, 06/03/2008   Tdap  08/11/2018   Zoster, Live 09/13/2015   Health Maintenance Due  Topic Date Due   Medicare Annual Wellness (AWV)  Never done      Past Medical History:  Diagnosis Date   ALLERGIC RHINITIS 07/03/2008   Allergy    seasonal   ANXIETY 06/02/2007   Arthritis    BELL'S PALSY, LEFT 12/02/2008   Cancer (HCC) 01/16/2015   kidney   CELLULITIS, LEFT LEG 09/14/2008   CHEST PAIN 07/07/2009   Complication of anesthesia 08-28-2014   "work up in recovery room with jaw locked open"   CORNS AND CALLUSES 07/03/2008   Depression    FOOT PAIN 09/22/2009   History of kidney cancer    IRREGULAR MENSES 06/03/2008   Neoplasm    left renal   OCD (obsessive compulsive disorder)    Renal disorder    Stress fracture of ankle since Jun 29, 2014   left   Supraventricular tachycardia 06/02/2007   Atrial tachycardia with Wenckebach periodicity   Past Surgical History:  Procedure Laterality Date   APPENDECTOMY  20016   BLEPHAROPLASTY Bilateral 10/31/2017   COLONOSCOPY  2009   HIP ARTHROPLASTY Right 11/18/2017   LAPAROSCOPIC APPENDECTOMY N/A 08/28/2014   Procedure: APPENDECTOMY LAPAROSCOPIC;  Surgeon: Jimmye Norman, MD;  Location: [facility] OR;  Service: General;  Laterality: N/A;   ROBOTIC ASSITED PARTIAL NEPHRECTOMY Left 01/06/2015   Procedure: ROBOTIC ASSISTED PARTIAL NEPHRECTOMY;  Surgeon: Heloise Purpura, MD;  Location: [facility];  Service: Urology;  Laterality: Left;  WISDOM TOOTH EXTRACTION      reports that she has never smoked. She has never used smokeless tobacco. She reports current alcohol use of about 2.0 standard drinks of alcohol per week. She reports that she does not use drugs. family history includes Asthma in her sister; Cancer in her mother; Colon polyps in her sister. Allergies  Allergen Reactions   Latex     REACTION: rash   Macrobid [Nitrofurantoin Macrocrystal] Swelling and Other (See Comments)    Lips and inside of mouth swelling   Amoxicillin Rash    Has patient had a PCN reaction causing immediate  rash, facial/tongue/throat swelling, SOB or lightheadedness with hypotension: unkown Has patient had a PCN reaction causing severe rash involving mucus membranes or skin necrosis: no Has patient had a PCN reaction that required hospitalization No Has patient had a PCN reaction occurring within the last 10 years: unknown If all of the above answers are "NO", then may proceed with Cephalosporin use.   Current Outpatient Medications on File Prior to Visit  Medication Sig Dispense Refill   Acetaminophen (TYLENOL PO) Take 1,000 mg by mouth daily as needed.      alendronate (FOSAMAX) 70 MG tablet Take 1 tablet (70 mg total) by mouth every 7 (seven) days. Take with a full glass of water on an empty stomach. 12 tablet 3   ALFALFA PO Take by mouth.     b complex vitamins tablet Take 1 tablet by mouth daily.     Calcium-Magnesium-Vitamin D (CALCIUM MAGNESIUM PO) Take 4 tablets by mouth daily.      cyanocobalamin 100 MCG tablet Take 100 mcg by mouth daily.     doxycycline (VIBRAMYCIN) 100 MG capsule Take 1 capsule (100 mg total) by mouth 2 (two) times daily. 20 capsule 0   fluconazole (DIFLUCAN) 150 MG tablet Take 1 tablet (150 mg total) by mouth once a week. 6 tablet 0   ibuprofen (ADVIL) 200 MG tablet Take 200 mg by mouth every 6 (six) hours as needed.     metoprolol succinate (TOPROL-XL) 25 MG 24 hr tablet Take 0.5 tablets (12.5 mg total) by mouth 2 (two) times daily. 90 tablet 3   Multiple Vitamin (MULTIVITAMIN) capsule Take 1 capsule by mouth daily.     PREVIDENT 5000 SENSITIVE 1.1-5 % GEL See admin instructions.     Probiotic Product (ALIGN) 4 MG CAPS Take 1 capsule by mouth daily.     tretinoin (RETIN-A) 0.025 % cream Apply topically.     zinc gluconate 50 MG tablet Take 50 mg by mouth daily.     [DISCONTINUED] estrogen, conjugated,-medroxyprogesterone (PREMPRO) 0.625-2.5 MG per tablet Take 1 tablet by mouth daily. 28 tablet 11   No current facility-administered medications on file prior to  visit.        ROS:  All others reviewed and negative.  Objective        PE:  BP 122/78 (BP Location: Right Arm, Patient Position: Sitting, Cuff Size: Normal)   Pulse 65   Temp 98.8 F (37.1 C) (Oral)   Ht 5' 6.5" (1.689 m)   Wt 136 lb (61.7 kg)   LMP 07/10/2012   SpO2 97%   BMI 21.62 kg/m                 Constitutional: Pt appears in NAD               HENT: Head: NCAT.  Right Ear: External ear normal.                 Left Ear: External ear normal.                Eyes: . Pupils are equal, round, and reactive to light. Conjunctivae and EOM are normal               Nose: without d/c or deformity               Neck: Neck supple. Gross normal ROM               Cardiovascular: Normal rate and regular rhythm.                 Pulmonary/Chest: Effort normal and breath sounds without rales or wheezing.                Abd:  Soft, NT, ND, + BS, no organomegaly               Neurological: Pt is alert. At baseline orientation, motor grossly intact               Skin: Skin is warm. No rashes, no other new lesions, LE edema - none               Psychiatric: Pt behavior is normal without agitation , mod nervous  Micro: none  Cardiac tracings I have personally interpreted today:  none  Pertinent Radiological findings (summarize): none   Lab Results  Component Value Date   WBC 6.9 05/26/2022   HGB 14.1 05/26/2022   HCT 41.5 05/26/2022   PLT 251 05/26/2022   GLUCOSE 93 05/26/2022   CHOL 266 (H) 09/28/2021   TRIG 106.0 09/28/2021   HDL 92.40 09/28/2021   LDLDIRECT 137.5 10/06/2012   LDLCALC 153 (H) 09/28/2021   ALT 21 09/28/2021   AST 18 09/28/2021   NA 142 05/26/2022   K 3.9 05/26/2022   CL 107 05/26/2022   CREATININE 0.73 05/26/2022   BUN 14 05/26/2022   CO2 28 05/26/2022   TSH 1.650 05/26/2022   HGBA1C 5.7 09/25/2020   Assessment/Plan:  FELISHIA WARTMAN is a 67 y.o. White or Caucasian [1] female with  has a past medical history of ALLERGIC RHINITIS (07/03/2008),  Allergy, ANXIETY (06/02/2007), Arthritis, BELL'S PALSY, LEFT (12/02/2008), Cancer (HCC) (01/16/2015), CELLULITIS, LEFT LEG (09/14/2008), CHEST PAIN (07/07/2009), Complication of anesthesia (08-28-2014), CORNS AND CALLUSES (07/03/2008), Depression, FOOT PAIN (09/22/2009), History of kidney cancer, IRREGULAR MENSES (06/03/2008), Neoplasm, OCD (obsessive compulsive disorder), Renal disorder, Stress fracture of ankle (since Jun 29, 2014), and Supraventricular tachycardia (06/02/2007).  Encounter for well adult exam with abnormal findings Age and sex appropriate education and counseling updated with regular exercise and diet Referrals for preventative services - none needed Immunizations addressed - declines covid booster, for shingrix at pharmacy Smoking counseling  - none needed Evidence for depression or other mood disorder - none significant Most recent labs reviewed. I have personally reviewed and have noted: 1) the patient's medical and social history 2) The patient's current medications and supplements 3) The patient's height, weight, and BMI have been recorded in the chart   Supraventricular tachycardia (HCC) Increased frequency recently, may be amenable to ablation, pt to cont current meds daily BB plus prn BB, vasovagals, and f/u cardiology as planned  Generalized anxiety disorder Ok for restart wellbutrin xl 300 qd  HLD (hyperlipidemia) Lab Results  Component Value Date   LDLCALC 153 (  H) 09/28/2021   Uncontrolled,  pt to continue current low chol diet, declines statin with recent card Ct score zero  Chest pain C/w non cardiac likely,  to f/u any worsening symptoms or concerns  Followup: Return if symptoms worsen or fail to improve.  Oliver Barre, MD 06/03/2022 6:23 PM [facility] Internal Medicine

## 2022-06-01 NOTE — Telephone Encounter (Signed)
Transition Care Management Follow-up Telephone Call Date of discharge and from where: 05/26/2022 How have you been since you were released from the hospital? Patient is feeling better, but still experiencing pressure in her chest. Any questions or concerns? No  Items Reviewed: Did the pt receive and understand the discharge instructions provided? Yes  Medications obtained and verified? Yes  Other? No  Any new allergies since your discharge? No  Dietary orders reviewed? Yes Do you have support at home?  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 Patient was offered caregiver resources but she is not interested at this time.   Follow up appointments reviewed:  PCP Hospital f/u appt confirmed? Yes  Scheduled to see Oliver Barre, MD on 06/01/2022 @ 2pm. Specialist [facility] f/u appt confirmed? Yes  Scheduled to see Olga Millers, MD on 07/01/2022 @ [facility]. Are transportation arrangements needed? No  If their condition worsens, is the pt aware to call PCP or go to the Emergency Dept.? Yes Was the patient provided with contact information for the PCP's office or ED? Yes Was to pt encouraged to call back with questions or concerns? Yes  Valery Amedee [facility]  [facility] Population Health Community Resource Care Guide   [email or web address]  ?? [phone]   Website: [email or web address]  [email or web address]

## 2022-06-01 NOTE — Patient Instructions (Signed)
Ok to restart the buproprion xl 300 mg per day  Please continue all other medications as before, including the klonopin as needed  Please have the pharmacy call with any other refills you may need.  Please keep your appointments with your specialists as you may have planned - cardiology soon

## 2022-06-03 DIAGNOSIS — R079 Chest pain, unspecified: Secondary | ICD-10-CM | POA: Insufficient documentation

## 2022-06-03 DIAGNOSIS — I471 Supraventricular tachycardia, unspecified: Secondary | ICD-10-CM | POA: Insufficient documentation

## 2022-06-03 NOTE — Assessment & Plan Note (Signed)
C/w non cardiac likely,  to f/u any worsening symptoms or concerns

## 2022-06-03 NOTE — Assessment & Plan Note (Signed)
Lab Results  Component Value Date   LDLCALC 153 (H) 09/28/2021   Uncontrolled,  pt to continue current low chol diet, declines statin with recent card Ct score zero

## 2022-06-03 NOTE — Assessment & Plan Note (Signed)
Ok for restart wellbutrin xl 300 qd

## 2022-06-03 NOTE — Assessment & Plan Note (Signed)
Age and sex appropriate education and counseling updated with regular exercise and diet Referrals for preventative services - none needed Immunizations addressed - declines covid booster, for shingrix at pharmacy Smoking counseling  - none needed Evidence for depression or other mood disorder - none significant Most recent labs reviewed. I have personally reviewed and have noted: 1) the patient's medical and social history 2) The patient's current medications and supplements 3) The patient's height, weight, and BMI have been recorded in the chart  

## 2022-06-03 NOTE — Assessment & Plan Note (Addendum)
Increased frequency recently, may be amenable to ablation, pt to cont current meds daily BB plus prn BB, vasovagals, and f/u cardiology as planned

## 2022-06-22 NOTE — Progress Notes (Signed)
Referring-James Schuman, PA-C Reason for referral-palpitations, SVT and elevated troponin  HPI: 67 year old female for evaluation of palpitations, SVT and elevated troponin at request of Evlyn Kanner, PA-C.  Patient has been seen in the past by Dr. Graciela Husbands for atrial tachycardia associated with Mobitz 1 periodicity.  Last seen 2014.  Echocardiogram 2012 showed normal LV function.  Calcium score 2021 0.  Seen in the emergency room May 26, 2022 with complaints of palpitations.  Troponin was 51 and 58.  D-dimer 0.36.  TSH 1.65.  Potassium 3.9.  Hemoglobin 14.1.  She has longstanding bouts of SVT by report.  She will feel her heart race and typically can break this with cough.  This occurs every 2 to 3 months with no associated symptoms.  However when she was seen in the emergency room the SVT would not resolve.  It lasted 2-1/2 hours and was associated with fatigue and mild dizziness.  No chest pain or syncope.  It broke in the ambulance prior to arrival to the emergency room.  No strips are available.  She has felt well since then and otherwise denies dyspnea on exertion, orthopnea, PND, pedal edema, exertional chest pain or syncope.  Cardiology now asked to evaluate.  Current Outpatient Medications  Medication Sig Dispense Refill   alendronate (FOSAMAX) 70 MG tablet Take 1 tablet (70 mg total) by mouth every 7 (seven) days. Take with a full glass of water on an empty stomach. 12 tablet 3   ALFALFA PO Take by mouth.     b complex vitamins tablet Take 1 tablet by mouth daily.     buPROPion (WELLBUTRIN XL) 300 MG 24 hr tablet Take 1 tablet (300 mg total) by mouth daily. 90 tablet 3   Calcium-Magnesium-Vitamin D (CALCIUM MAGNESIUM PO) Take 4 tablets by mouth daily.      clonazePAM (KLONOPIN) 1 MG tablet 1 tab by mouth once daily as needed 30 tablet 2   cyanocobalamin 100 MCG tablet Take 100 mcg by mouth daily.     metoprolol succinate (TOPROL-XL) 25 MG 24 hr tablet Take 0.5 tablets (12.5 mg total)  by mouth 2 (two) times daily. 90 tablet 3   Multiple Vitamin (MULTIVITAMIN) capsule Take 1 capsule by mouth daily.     PREVIDENT 5000 SENSITIVE 1.1-5 % GEL See admin instructions.     Probiotic Product (ALIGN) 4 MG CAPS Take 1 capsule by mouth daily.     zinc gluconate 50 MG tablet Take 50 mg by mouth daily.     Acetaminophen (TYLENOL PO) Take 1,000 mg by mouth daily as needed.  (Patient not taking: Reported on 07/01/2022)     doxycycline (VIBRAMYCIN) 100 MG capsule Take 1 capsule (100 mg total) by mouth 2 (two) times daily. (Patient not taking: Reported on 07/01/2022) 20 capsule 0   fluconazole (DIFLUCAN) 150 MG tablet Take 1 tablet (150 mg total) by mouth once a week. (Patient not taking: Reported on 07/01/2022) 6 tablet 0   ibuprofen (ADVIL) 200 MG tablet Take 200 mg by mouth every 6 (six) hours as needed. (Patient not taking: Reported on 07/01/2022)     metoprolol tartrate (LOPRESSOR) 50 MG tablet Take 1 tablet (50 mg total) by mouth 2 (two) times daily as needed. 180 tablet 3   tretinoin (RETIN-A) 0.025 % cream Apply topically. (Patient not taking: Reported on 07/01/2022)     No current facility-administered medications for this visit.    Allergies  Allergen Reactions   Latex     REACTION:  rash   Macrobid [Nitrofurantoin Macrocrystal] Swelling and Other (See Comments)    Lips and inside of mouth swelling   Amoxicillin Rash    Has patient had a PCN reaction causing immediate rash, facial/tongue/throat swelling, SOB or lightheadedness with hypotension: unkown Has patient had a PCN reaction causing severe rash involving mucus membranes or skin necrosis: no Has patient had a PCN reaction that required hospitalization No Has patient had a PCN reaction occurring within the last 10 years: unknown If all of the above answers are "NO", then may proceed with Cephalosporin use.     Past Medical History:  Diagnosis Date   ALLERGIC RHINITIS 07/03/2008   Allergy    seasonal   ANXIETY 06/02/2007    Arthritis    BELL'S PALSY, LEFT 12/02/2008   Cancer (HCC) 01/16/2015   kidney   CELLULITIS, LEFT LEG 09/14/2008   CHEST PAIN 07/07/2009   Complication of anesthesia 08/28/2014   "work up in recovery room with jaw locked open"   CORNS AND CALLUSES 07/03/2008   Depression    FOOT PAIN 09/22/2009   History of kidney cancer    Hyperlipidemia    IRREGULAR MENSES 06/03/2008   Neoplasm    left renal   OCD (obsessive compulsive disorder)    Renal disorder    Stress fracture of ankle since Jun 29, 2014   left   Supraventricular tachycardia 06/02/2007   Atrial tachycardia with Wenckebach periodicity    Past Surgical History:  Procedure Laterality Date   APPENDECTOMY  20016   BLEPHAROPLASTY Bilateral 10/31/2017   COLONOSCOPY  2009   HIP ARTHROPLASTY Right 11/18/2017   LAPAROSCOPIC APPENDECTOMY N/A 08/28/2014   Procedure: APPENDECTOMY LAPAROSCOPIC;  Surgeon: Jimmye Norman, MD;  Location: [facility] OR;  Service: General;  Laterality: N/A;   ROBOTIC ASSITED PARTIAL NEPHRECTOMY Left 01/06/2015   Procedure: ROBOTIC ASSISTED PARTIAL NEPHRECTOMY;  Surgeon: Heloise Purpura, MD;  Location: [facility];  Service: Urology;  Laterality: Left;   WISDOM TOOTH EXTRACTION      Social History   Socioeconomic History   Marital status: Married    Spouse name: Not on file   Number of children: 1   Years of education: Not on file   Highest education level: Not on file  Occupational History   Occupation: Hospital doctor: [employer]  Tobacco Use   Smoking status: Never   Smokeless tobacco: Never  Vaping Use   Vaping Use: Never used  Substance and Sexual Activity   Alcohol use: Yes    Alcohol/week: 2.0 standard drinks of alcohol    Types: 1 Glasses of wine, 1 Shots of liquor per week    Comment: social    Drug use: No   Sexual activity: Yes    Comment: married  Other Topics Concern   Not on file  Social History Narrative   No caffeine    Social Determinants of Manufacturing engineer Strain: Not on file  Food Insecurity: Not on file  Transportation Needs: Not on file  Physical Activity: Not on file  Stress: Not on file  Social Connections: Not on file  Intimate Partner Violence: Not on file    Family History  Problem Relation Age of Onset   Asthma Sister    Colon polyps Sister    Cancer Mother        [place] where cancer started    Colon cancer Neg Hx    Esophageal cancer Neg Hx    Rectal  cancer Neg Hx    Stomach cancer Neg Hx     ROS: no fevers or chills, productive cough, hemoptysis, dysphasia, odynophagia, melena, hematochezia, dysuria, hematuria, rash, seizure activity, orthopnea, PND, pedal edema, claudication. Remaining systems are negative.  Physical Exam:   Blood pressure 112/60, pulse 65, height 5' 6.5" (1.689 m), weight 137 lb 9.6 oz (62.4 kg), last menstrual period 07/10/2012, SpO2 98 %.  General:  Well developed/well nourished in NAD Skin warm/dry Patient not depressed No peripheral clubbing Back-normal HEENT-normal/normal eyelids Neck supple/normal carotid upstroke bilaterally; no bruits; no JVD; no thyromegaly chest - CTA/ normal expansion CV - RRR/normal S1 and S2; no murmurs, rubs or gallops;  PMI nondisplaced Abdomen -NT/ND, no HSM, no mass, + bowel sounds, no bruit 2+ femoral pulses, no bruits Ext-no edema, chords, 2+ DP Neuro-grossly nonfocal  ECG -May 26, 2022-sinus rhythm with no ST changes.  Personally reviewed  A/P  1 palpitations/SVT-patient with long history of SVT by report.  Previously seen by Dr. Graciela Husbands for atrial tachycardia.  I do not have rhythm strips from most recent episode but will try and obtain from EMS.  Will continue beta-blocker.  Check echocardiogram for LV function.  We discussed Valsalva maneuvers.  At present she does not want to proceed with consideration of ablation.  However she will think about this with her husband.  If her episodes become more frequent she would likely be  agreeable in the future.  2 mildly elevated troponin-minimal elevation during recent ER evaluation but no chest pain and no ST changes.  Likely in the setting of SVT.  No further ischemia evaluation.  Olga Millers, MD

## 2022-07-01 ENCOUNTER — Ambulatory Visit: Payer: PPO | Attending: Cardiology | Admitting: Cardiology

## 2022-07-01 ENCOUNTER — Encounter: Payer: Self-pay | Admitting: Cardiology

## 2022-07-01 VITALS — BP 112/60 | HR 65 | Ht 66.5 in | Wt 137.6 lb

## 2022-07-01 DIAGNOSIS — R7989 Other specified abnormal findings of blood chemistry: Secondary | ICD-10-CM

## 2022-07-01 DIAGNOSIS — R002 Palpitations: Secondary | ICD-10-CM | POA: Diagnosis not present

## 2022-07-01 MED ORDER — METOPROLOL TARTRATE 50 MG PO TABS: 50.0000 mg | ORAL_TABLET | Freq: Two times a day (BID) | ORAL | 3 refills | Status: AC | PRN

## 2022-07-01 NOTE — Patient Instructions (Signed)
  Testing/Procedures: Your physician has requested that you have an echocardiogram. Echocardiography is a painless test that uses sound waves to create images of your heart. It provides your doctor with information about the size and shape of your heart and how well your heart's chambers and valves are working. This procedure takes approximately one hour. There are no restrictions for this procedure. Please do NOT wear cologne, perfume, aftershave, or lotions (deodorant is allowed). Please arrive 15 minutes prior to your appointment time. [address]   Follow-Up: At [facility], you and your health needs are our priority.  As part of our continuing mission to provide you with exceptional heart care, we have created designated Provider Care Teams.  These Care Teams include your primary Cardiologist (physician) and Advanced Practice Providers (APPs -  Physician Assistants and Nurse Practitioners) who all work together to provide you with the care you need, when you need it.  We recommend signing up for the patient portal called "MyChart".  Sign up information is provided on this After Visit Summary.  MyChart is used to connect with patients for Virtual Visits (Telemedicine).  Patients are able to view lab/test results, encounter notes, upcoming appointments, etc.  Non-urgent messages can be sent to your provider as well.   To learn more about what you can do with MyChart, go to [email or web address].    Your next appointment:   6 month(s)  Provider:   BRIAN CRENSHAW MD    

## 2022-07-12 DIAGNOSIS — M25532 Pain in left wrist: Secondary | ICD-10-CM | POA: Diagnosis not present

## 2022-07-12 DIAGNOSIS — M25531 Pain in right wrist: Secondary | ICD-10-CM | POA: Diagnosis not present

## 2022-07-17 ENCOUNTER — Ambulatory Visit
Admission: RE | Admit: 2022-07-17 | Discharge: 2022-07-17 | Disposition: A | Discharge status: Home / Self Care | Payer: PPO | Source: Ambulatory Visit | Attending: Nurse Practitioner | Admitting: Nurse Practitioner

## 2022-07-17 VITALS — BP 115/74 | HR 68 | Temp 98.4°F | Resp 17

## 2022-07-17 DIAGNOSIS — J069 Acute upper respiratory infection, unspecified: Secondary | ICD-10-CM | POA: Diagnosis not present

## 2022-07-17 DIAGNOSIS — J029 Acute pharyngitis, unspecified: Secondary | ICD-10-CM | POA: Diagnosis not present

## 2022-07-17 LAB — POCT RAPID STREP A (OFFICE): Rapid Strep A Screen: NEGATIVE

## 2022-07-17 NOTE — ED Provider Notes (Signed)
[facility]    CSN: [id number] Arrival date & time: 07/17/22  1234      History   Chief Complaint Chief Complaint  Patient presents with   Generalized Body Aches    Sore throat. Please text if you have a cancellation before 12:45.Thanks - Entered by patient    HPI Shelby Grant is a 67 y.o. female  presents for evaluation of URI symptoms for 3 days. Patient reports associated symptoms of sore throat, fatigue, body aches, cough with blood tinged sputum, 1 day of low-grade fever of 99. Denies N/V/D, ear pain, shortness of breath. Patient does not have a hx of asthma or smoking. No known sick contacts.  Pt has taken ibuprofen OTC for symptoms. Pt has no other concerns at this time.   HPI  Past Medical History:  Diagnosis Date   ALLERGIC RHINITIS 07/03/2008   Allergy    seasonal   ANXIETY 06/02/2007   Arthritis    BELL'S PALSY, LEFT 12/02/2008   Cancer (HCC) 01/16/2015   kidney   CELLULITIS, LEFT LEG 09/14/2008   CHEST PAIN 07/07/2009   Complication of anesthesia 08/28/2014   "work up in recovery room with jaw locked open"   CORNS AND CALLUSES 07/03/2008   Depression    FOOT PAIN 09/22/2009   History of kidney cancer    Hyperlipidemia    IRREGULAR MENSES 06/03/2008   Neoplasm    left renal   OCD (obsessive compulsive disorder)    Renal disorder    Stress fracture of ankle since Jun 29, 2014   left   Supraventricular tachycardia 06/02/2007   Atrial tachycardia with Wenckebach periodicity    Patient Active Problem List   Diagnosis Date Noted   Chest pain 06/03/2022   Sprain of right wrist 03/11/2022   Sprain of left ankle 01/21/2022   Acute dysfunction of left eustachian tube 09/30/2021   Left hip pain 07/09/2021   Osteoporosis 09/22/2019   Loss of transverse plantar arch 12/04/2018   Renal cell cancer (HCC) 08/11/2018   Refusal of blood transfusions as patient is Jehovah's Witness 08/11/2018   HLD (hyperlipidemia) 08/11/2018   Encounter for  screening for cervical cancer 03/29/2017   Encounter for well adult exam with abnormal findings 03/29/2017   Colon cancer screening 12/04/2015   Open bite of right thumb without damage to nail 08/04/2015   Menopause syndrome 02/11/2011   Allergic rhinitis 07/03/2008   Supraventricular tachycardia (HCC) 06/02/2007   Generalized anxiety disorder 06/02/2007    Past Surgical History:  Procedure Laterality Date   APPENDECTOMY  20016   BLEPHAROPLASTY Bilateral 10/31/2017   COLONOSCOPY  2009   HIP ARTHROPLASTY Right 11/18/2017   LAPAROSCOPIC APPENDECTOMY N/A 08/28/2014   Procedure: APPENDECTOMY LAPAROSCOPIC;  Surgeon: Jimmye Norman, MD;  Location: [facility];  Service: General;  Laterality: N/A;   ROBOTIC ASSITED PARTIAL NEPHRECTOMY Left 01/06/2015   Procedure: ROBOTIC ASSISTED PARTIAL NEPHRECTOMY;  Surgeon: Heloise Purpura, MD;  Location: [facility];  Service: Urology;  Laterality: Left;   WISDOM TOOTH EXTRACTION      OB History   No obstetric history on file.      Home Medications    Prior to Admission medications   Medication Sig Start Date End Date Taking? Authorizing Provider  Acetaminophen (TYLENOL PO) Take 1,000 mg by mouth daily as needed.  Patient not taking: Reported on 07/01/2022    [provider]  alendronate (FOSAMAX) 70 MG tablet Take 1 tablet (70 mg total) by mouth every 7 (seven) days.  Take with a full glass of water on an empty stomach. 01/07/22   Corwin Levins, MD  ALFALFA PO Take by mouth.    [provider]  b complex vitamins tablet Take 1 tablet by mouth daily.    [provider]  buPROPion (WELLBUTRIN XL) 300 MG 24 hr tablet Take 1 tablet (300 mg total) by mouth daily. 06/01/22   Corwin Levins, MD  Calcium-Magnesium-Vitamin D (CALCIUM MAGNESIUM PO) Take 4 tablets by mouth daily.     [provider]  clonazePAM Scarlette Calico) 1 MG tablet 1 tab by mouth once daily as needed 06/01/22   Corwin Levins, MD  cyanocobalamin 100 MCG tablet Take 100 mcg by  mouth daily.    [provider]  doxycycline (VIBRAMYCIN) 100 MG capsule Take 1 capsule (100 mg total) by mouth 2 (two) times daily. Patient not taking: Reported on 07/01/2022 11/26/21   Raspet, Noberto Retort, PA-C  fluconazole (DIFLUCAN) 150 MG tablet Take 1 tablet (150 mg total) by mouth once a week. Patient not taking: Reported on 07/01/2022 02/16/22   Wallis Bamberg, PA-C  ibuprofen (ADVIL) 200 MG tablet Take 200 mg by mouth every 6 (six) hours as needed. Patient not taking: Reported on 07/01/2022    [provider]  metoprolol succinate (TOPROL-XL) 25 MG 24 hr tablet Take 0.5 tablets (12.5 mg total) by mouth 2 (two) times daily. 10/14/21   Corwin Levins, MD  metoprolol tartrate (LOPRESSOR) 50 MG tablet Take 1 tablet (50 mg total) by mouth 2 (two) times daily as needed. 07/01/22   Lewayne Bunting, MD  Multiple Vitamin (MULTIVITAMIN) capsule Take 1 capsule by mouth daily.    [provider]  PREVIDENT 5000 SENSITIVE 1.1-5 % GEL See admin instructions. 03/10/21   [provider]  Probiotic Product (ALIGN) 4 MG CAPS Take 1 capsule by mouth daily.    [provider]  tretinoin (RETIN-A) 0.025 % cream Apply topically. Patient not taking: Reported on 07/01/2022 03/04/21   [provider]  zinc gluconate 50 MG tablet Take 50 mg by mouth daily.    [provider]  estrogen, conjugated,-medroxyprogesterone (PREMPRO) 0.625-2.5 MG per tablet Take 1 tablet by mouth daily. 02/11/11 03/09/17  Roderick Pee, MD    Family History Family History  Problem Relation Age of Onset   Asthma Sister    Colon polyps Sister    Cancer Mother        [facility] where cancer started    Colon cancer Neg Hx    Esophageal cancer Neg Hx    Rectal cancer Neg Hx    Stomach cancer Neg Hx     Social History Social History   Tobacco Use   Smoking status: Never   Smokeless tobacco: Never  Vaping Use   Vaping Use: Never used  Substance Use Topics   Alcohol use: Yes     Alcohol/week: 2.0 standard drinks of alcohol    Types: 1 Glasses of wine, 1 Shots of liquor per week    Comment: social    Drug use: No     Allergies   Latex, Macrobid [nitrofurantoin macrocrystal], and Amoxicillin   Review of Systems Review of Systems  Constitutional:  Positive for fever.  HENT:  Positive for sore throat.   Respiratory:  Positive for cough.   Musculoskeletal:  Positive for myalgias.     Physical Exam Triage Vital Signs ED Triage Vitals [07/17/22 1247]  Enc Vitals Group     BP 115/74  Pulse Rate 68     Resp 17     Temp 98.4 F (36.9 C)     Temp Source Oral     SpO2 95 %     Weight      Height      Head Circumference      Peak Flow      Pain Score 3     Pain Loc      Pain Edu?      Excl. in GC?    No data found.  Updated Vital Signs BP 115/74 (BP Location: Right Arm)   Pulse 68   Temp 98.4 F (36.9 C) (Oral)   Resp 17   LMP 07/10/2012   SpO2 95%   Visual Acuity Right Eye Distance:   Left Eye Distance:   Bilateral Distance:    Right Eye Near:   Left Eye Near:    Bilateral Near:     Physical Exam Vitals and nursing note reviewed.  Constitutional:      General: She is not in acute distress.    Appearance: She is well-developed. She is not ill-appearing.  HENT:     Head: Normocephalic and atraumatic.     Right Ear: Tympanic membrane and ear canal normal.     Left Ear: Tympanic membrane and ear canal normal.     Nose: Congestion present.     Mouth/Throat:     Mouth: Mucous membranes are moist.     Pharynx: Oropharynx is clear. Uvula midline. Posterior oropharyngeal erythema present.     Tonsils: No tonsillar exudate or tonsillar abscesses.  Eyes:     Conjunctiva/sclera: Conjunctivae normal.     Pupils: Pupils are equal, round, and reactive to light.  Cardiovascular:     Rate and Rhythm: Normal rate and regular rhythm.     Heart sounds: Normal heart sounds.  Pulmonary:     Effort: Pulmonary effort is normal.     Breath  sounds: Normal breath sounds.  Musculoskeletal:     Cervical back: Normal range of motion and neck supple.  Lymphadenopathy:     Cervical: No cervical adenopathy.  Skin:    General: Skin is warm and dry.  Neurological:     General: No focal deficit present.     Mental Status: She is alert and oriented to person, place, and time.  Psychiatric:        Mood and Affect: Mood normal.        Behavior: Behavior normal.      UC Treatments / Results  Labs (all labs ordered are listed, but only abnormal results are displayed) Labs Reviewed  CULTURE, GROUP A STREP [facility])  POCT RAPID STREP A (OFFICE)    EKG   Radiology No results found.  Procedures Procedures (including critical care time)  Medications Ordered in UC Medications - No data to display  Initial Impression / Assessment and Plan / UC Course  I have reviewed the triage vital signs and the nursing notes.  Pertinent labs & imaging results that were available during my care of the patient were reviewed by me and considered in my medical decision making (see chart for details).     Reviewed exam and symptoms with patient.  No red flags.  Negative rapid strep, will culture.  Patient declines COVID testing.  Discussed viral illness and symptomatic treatment.  OTC cough medicine as needed.  Salt water gargles and warm liquids.  PCP follow-up if symptoms do not improve.  ER precautions reviewed.  Final Clinical Impressions(s) / UC Diagnoses   Final diagnoses:  Sore throat  Viral upper respiratory illness     Discharge Instructions      Your rapid strep test was negative in the clinic.  The clinic will send this for a throat culture and contact you with that result is positive, typically takes 2 days.  Continue warm liquids such as tea with honey.  Salt water gargles.  You can take over-the-counter Tylenol or ibuprofen as needed.  Please follow-up with your PCP if your symptoms do not improve Please go to the emergency  room if you develop any worsening symptoms I hope you feel better soon!   ED Prescriptions   None    PDMP not reviewed this encounter.   Radford Pax, NP 07/17/22 1304

## 2022-07-17 NOTE — Discharge Instructions (Signed)
Your rapid strep test was negative in the clinic.  The clinic will send this for a throat culture and contact you with that result is positive, typically takes 2 days.  Continue warm liquids such as tea with honey.  Salt water gargles.  You can take over-the-counter Tylenol or ibuprofen as needed.  Please follow-up with your PCP if your symptoms do not improve Please go to the emergency room if you develop any worsening symptoms I hope you feel better soon!

## 2022-07-17 NOTE — ED Triage Notes (Signed)
Pt presents with c/o sore throat and fatigue x 3 days. Is coughing up bloody phlegm. Has taken advil and throat lozenges.

## 2022-07-20 LAB — CULTURE, GROUP A STREP (THRC)

## 2022-07-29 ENCOUNTER — Ambulatory Visit (HOSPITAL_COMMUNITY): Payer: PPO | Attending: Internal Medicine

## 2022-07-29 DIAGNOSIS — R7989 Other specified abnormal findings of blood chemistry: Secondary | ICD-10-CM | POA: Insufficient documentation

## 2022-07-29 DIAGNOSIS — E785 Hyperlipidemia, unspecified: Secondary | ICD-10-CM | POA: Diagnosis not present

## 2022-07-29 DIAGNOSIS — R079 Chest pain, unspecified: Secondary | ICD-10-CM | POA: Diagnosis not present

## 2022-07-29 DIAGNOSIS — R002 Palpitations: Secondary | ICD-10-CM

## 2022-07-29 DIAGNOSIS — I4719 Other supraventricular tachycardia: Secondary | ICD-10-CM | POA: Insufficient documentation

## 2022-07-29 LAB — ECHOCARDIOGRAM COMPLETE
Area-P 1/2: 3.4 cm2
S' Lateral: 2.1 cm

## 2022-08-02 ENCOUNTER — Encounter: Payer: Self-pay | Admitting: *Deleted

## 2022-08-31 DIAGNOSIS — C44329 Squamous cell carcinoma of skin of other parts of face: Secondary | ICD-10-CM | POA: Diagnosis not present

## 2022-08-31 DIAGNOSIS — D485 Neoplasm of uncertain behavior of skin: Secondary | ICD-10-CM | POA: Diagnosis not present

## 2022-09-03 DIAGNOSIS — R102 Pelvic and perineal pain: Secondary | ICD-10-CM | POA: Diagnosis not present

## 2022-09-03 DIAGNOSIS — R8271 Bacteriuria: Secondary | ICD-10-CM | POA: Diagnosis not present

## 2022-09-09 DIAGNOSIS — L821 Other seborrheic keratosis: Secondary | ICD-10-CM | POA: Diagnosis not present

## 2022-09-09 DIAGNOSIS — C44329 Squamous cell carcinoma of skin of other parts of face: Secondary | ICD-10-CM | POA: Diagnosis not present

## 2022-09-09 DIAGNOSIS — L815 Leukoderma, not elsewhere classified: Secondary | ICD-10-CM | POA: Diagnosis not present

## 2022-09-09 DIAGNOSIS — D2371 Other benign neoplasm of skin of right lower limb, including hip: Secondary | ICD-10-CM | POA: Diagnosis not present

## 2022-09-09 DIAGNOSIS — Z85828 Personal history of other malignant neoplasm of skin: Secondary | ICD-10-CM | POA: Diagnosis not present

## 2022-09-09 DIAGNOSIS — M6281 Muscle weakness (generalized): Secondary | ICD-10-CM | POA: Diagnosis not present

## 2022-09-09 DIAGNOSIS — L814 Other melanin hyperpigmentation: Secondary | ICD-10-CM | POA: Diagnosis not present

## 2022-09-09 DIAGNOSIS — N3946 Mixed incontinence: Secondary | ICD-10-CM | POA: Diagnosis not present

## 2022-09-09 DIAGNOSIS — N941 Unspecified dyspareunia: Secondary | ICD-10-CM | POA: Diagnosis not present

## 2022-09-09 DIAGNOSIS — Z08 Encounter for follow-up examination after completed treatment for malignant neoplasm: Secondary | ICD-10-CM | POA: Diagnosis not present

## 2022-09-16 ENCOUNTER — Encounter (INDEPENDENT_AMBULATORY_CARE_PROVIDER_SITE_OTHER): Payer: Self-pay

## 2022-09-24 DIAGNOSIS — C44329 Squamous cell carcinoma of skin of other parts of face: Secondary | ICD-10-CM | POA: Diagnosis not present

## 2022-09-24 DIAGNOSIS — L723 Sebaceous cyst: Secondary | ICD-10-CM | POA: Diagnosis not present

## 2022-09-24 DIAGNOSIS — D485 Neoplasm of uncertain behavior of skin: Secondary | ICD-10-CM | POA: Diagnosis not present

## 2022-09-30 ENCOUNTER — Other Ambulatory Visit (INDEPENDENT_AMBULATORY_CARE_PROVIDER_SITE_OTHER): Payer: PPO

## 2022-09-30 DIAGNOSIS — R739 Hyperglycemia, unspecified: Secondary | ICD-10-CM | POA: Diagnosis not present

## 2022-09-30 DIAGNOSIS — E7800 Pure hypercholesterolemia, unspecified: Secondary | ICD-10-CM

## 2022-09-30 DIAGNOSIS — E559 Vitamin D deficiency, unspecified: Secondary | ICD-10-CM

## 2022-09-30 DIAGNOSIS — E538 Deficiency of other specified B group vitamins: Secondary | ICD-10-CM | POA: Diagnosis not present

## 2022-09-30 LAB — CBC WITH DIFFERENTIAL/PLATELET
Basophils Absolute: 0 10*3/uL (ref 0.0–0.1)
Basophils Relative: 0.5 % (ref 0.0–3.0)
Eosinophils Absolute: 0.3 10*3/uL (ref 0.0–0.7)
Eosinophils Relative: 4 % (ref 0.0–5.0)
HCT: 42.8 % (ref 36.0–46.0)
Hemoglobin: 13.9 g/dL (ref 12.0–15.0)
Lymphocytes Relative: 31.9 % (ref 12.0–46.0)
Lymphs Abs: 2 10*3/uL (ref 0.7–4.0)
MCHC: 32.6 g/dL (ref 30.0–36.0)
MCV: 93.4 fl (ref 78.0–100.0)
Monocytes Absolute: 0.5 10*3/uL (ref 0.1–1.0)
Monocytes Relative: 8 % (ref 3.0–12.0)
Neutro Abs: 3.5 10*3/uL (ref 1.4–7.7)
Neutrophils Relative %: 55.6 % (ref 43.0–77.0)
Platelets: 245 10*3/uL (ref 150.0–400.0)
RBC: 4.59 Mil/uL (ref 3.87–5.11)
RDW: 13.6 % (ref 11.5–15.5)
WBC: 6.4 10*3/uL (ref 4.0–10.5)

## 2022-09-30 LAB — URINALYSIS, ROUTINE W REFLEX MICROSCOPIC
Bilirubin Urine: NEGATIVE
Ketones, ur: NEGATIVE
Leukocytes,Ua: NEGATIVE
Nitrite: POSITIVE — AB
Specific Gravity, Urine: 1.015 (ref 1.000–1.030)
Total Protein, Urine: NEGATIVE
Urine Glucose: NEGATIVE
Urobilinogen, UA: 0.2 (ref 0.0–1.0)
pH: 6.5 (ref 5.0–8.0)

## 2022-09-30 LAB — TSH: TSH: 1.41 u[IU]/mL (ref 0.35–5.50)

## 2022-09-30 LAB — HEPATIC FUNCTION PANEL
ALT: 19 U/L (ref 0–35)
AST: 18 U/L (ref 0–37)
Albumin: 4.3 g/dL (ref 3.5–5.2)
Alkaline Phosphatase: 40 U/L (ref 39–117)
Bilirubin, Direct: 0.1 mg/dL (ref 0.0–0.3)
Total Bilirubin: 0.5 mg/dL (ref 0.2–1.2)
Total Protein: 7.2 g/dL (ref 6.0–8.3)

## 2022-09-30 LAB — BASIC METABOLIC PANEL
BUN: 23 mg/dL (ref 6–23)
CO2: 29 mEq/L (ref 19–32)
Calcium: 9.6 mg/dL (ref 8.4–10.5)
Chloride: 102 mEq/L (ref 96–112)
Creatinine, Ser: 0.92 mg/dL (ref 0.40–1.20)
GFR: 65.01 mL/min (ref >60.00)
Glucose, Bld: 75 mg/dL (ref 70–99)
Potassium: 4.2 mEq/L (ref 3.5–5.1)
Sodium: 139 mEq/L (ref 135–145)

## 2022-09-30 LAB — VITAMIN D 25 HYDROXY (VIT D DEFICIENCY, FRACTURES): VITD: 55.47 ng/mL (ref 30.00–100.00)

## 2022-09-30 LAB — LIPID PANEL
Cholesterol: 268 mg/dL — ABNORMAL HIGH (ref 0–200)
HDL: 79.9 mg/dL (ref >39.00)
LDL Cholesterol: 173 mg/dL — ABNORMAL HIGH (ref 0–99)
NonHDL: 187.8
Total CHOL/HDL Ratio: 3
Triglycerides: 74 mg/dL (ref 0.0–149.0)
VLDL: 14.8 mg/dL (ref 0.0–40.0)

## 2022-09-30 LAB — HEMOGLOBIN A1C: Hgb A1c MFr Bld: 5.7 % (ref 4.6–6.5)

## 2022-09-30 LAB — VITAMIN B12: Vitamin B-12: 837 pg/mL (ref 211–911)

## 2022-10-05 ENCOUNTER — Ambulatory Visit (INDEPENDENT_AMBULATORY_CARE_PROVIDER_SITE_OTHER): Payer: PPO

## 2022-10-05 ENCOUNTER — Encounter: Payer: Self-pay | Admitting: Internal Medicine

## 2022-10-05 ENCOUNTER — Ambulatory Visit
Admission: RE | Admit: 2022-10-05 | Discharge: 2022-10-05 | Disposition: A | Discharge status: Home / Self Care | Payer: PPO | Source: Ambulatory Visit | Attending: Internal Medicine | Admitting: Internal Medicine

## 2022-10-05 ENCOUNTER — Ambulatory Visit (INDEPENDENT_AMBULATORY_CARE_PROVIDER_SITE_OTHER): Payer: PPO | Admitting: Internal Medicine

## 2022-10-05 VITALS — Ht 66.5 in | Wt 135.0 lb

## 2022-10-05 VITALS — BP 122/70 | HR 65 | Temp 98.4°F | Ht 66.5 in | Wt 137.0 lb

## 2022-10-05 DIAGNOSIS — F411 Generalized anxiety disorder: Secondary | ICD-10-CM

## 2022-10-05 DIAGNOSIS — Z0000 Encounter for general adult medical examination without abnormal findings: Secondary | ICD-10-CM | POA: Diagnosis not present

## 2022-10-05 DIAGNOSIS — E7800 Pure hypercholesterolemia, unspecified: Secondary | ICD-10-CM | POA: Diagnosis not present

## 2022-10-05 DIAGNOSIS — M810 Age-related osteoporosis without current pathological fracture: Secondary | ICD-10-CM

## 2022-10-05 DIAGNOSIS — R9431 Abnormal electrocardiogram [ECG] [EKG]: Secondary | ICD-10-CM

## 2022-10-05 DIAGNOSIS — J309 Allergic rhinitis, unspecified: Secondary | ICD-10-CM | POA: Diagnosis not present

## 2022-10-05 DIAGNOSIS — E559 Vitamin D deficiency, unspecified: Secondary | ICD-10-CM

## 2022-10-05 DIAGNOSIS — R739 Hyperglycemia, unspecified: Secondary | ICD-10-CM

## 2022-10-05 DIAGNOSIS — E538 Deficiency of other specified B group vitamins: Secondary | ICD-10-CM

## 2022-10-05 NOTE — Patient Instructions (Signed)
Please continue all other medications as before, and refills have been done if requested.  Please have the pharmacy call with any other refills you may need.  Please continue your efforts at being more active, low cholesterol diet, and weight control.  You are otherwise up to date with prevention measures today.  Please keep your appointments with your specialists as you may have planned Please schedule the bone density test before leaving today at the scheduling desk (where you check out)  You will be contacted regarding the referral for: Card CT score, and Nutrition  Please make an Appointment to return for your 1 year visit, or sooner if needed, with Lab testing by Appointment as well, to be done about 3-5 days before at the FIRST FLOOR Lab (so this is for TWO appointments - please see the scheduling desk as you leave)

## 2022-10-05 NOTE — Progress Notes (Signed)
Patient ID: Shelby Grant, female   DOB: [birth year], 68 y.o.   MRN: [id number]        Chief Complaint: follow up hld, osteoporosis, anxiety, allergies       HPI:  Shelby Grant is a 67 y.o. female here with c/o overall doing ok.  Pt denies chest pain, increased sob or doe, wheezing, orthopnea, PND, increased LE swelling, palpitations, dizziness or syncope.   Pt denies polydipsia, polyuria, or new focal neuro s/s.   Pt denies fever, wt loss, night sweats, loss of appetite, or other constitutional symptoms  Due for f/u DXA, tolerating fosamax.  Does not want to start statin if possible, ok for nutrition referral and fu card ct score.   Denies worsening depressive symptoms, suicidal ideation, or panic; has ongoing anxiety Does have several wks ongoing nasal allergy symptoms with clearish congestion, itch and sneezing, without fever, pain, ST, cough, swelling or wheezing.  Wt Readings from Last 3 Encounters:  10/05/22 137 lb (62.1 kg)  10/05/22 135 lb (61.2 kg)  07/01/22 137 lb 9.6 oz (62.4 kg)   BP Readings from Last 3 Encounters:  10/05/22 122/70  07/17/22 115/74  07/01/22 112/60         Past Medical History:  Diagnosis Date   ALLERGIC RHINITIS 07/03/2008   Allergy    seasonal   ANXIETY 06/02/2007   Arthritis    BELL'S PALSY, LEFT 12/02/2008   Cancer (HCC) 01/16/2015   kidney   CELLULITIS, LEFT LEG 09/14/2008   CHEST PAIN 07/07/2009   Complication of anesthesia 08/28/2014   "work up in recovery room with jaw locked open"   CORNS AND CALLUSES 07/03/2008   Depression    FOOT PAIN 09/22/2009   History of kidney cancer    Hyperlipidemia    IRREGULAR MENSES 06/03/2008   Neoplasm    left renal   OCD (obsessive compulsive disorder)    Renal disorder    Stress fracture of ankle since Jun 29, 2014   left   Supraventricular tachycardia 06/02/2007   Atrial tachycardia with Wenckebach periodicity   Past Surgical History:  Procedure Laterality Date   APPENDECTOMY  20016    BLEPHAROPLASTY Bilateral 10/31/2017   COLONOSCOPY  2009   HIP ARTHROPLASTY Right 11/18/2017   LAPAROSCOPIC APPENDECTOMY N/A 08/28/2014   Procedure: APPENDECTOMY LAPAROSCOPIC;  Surgeon: Jimmye Norman, MD;  Location: [facility] OR;  Service: General;  Laterality: N/A;   ROBOTIC ASSITED PARTIAL NEPHRECTOMY Left 01/06/2015   Procedure: ROBOTIC ASSISTED PARTIAL NEPHRECTOMY;  Surgeon: Heloise Purpura, MD;  Location: [facility];  Service: Urology;  Laterality: Left;   WISDOM TOOTH EXTRACTION      reports that she has never smoked. She has never used smokeless tobacco. She reports current alcohol use of about 2.0 standard drinks of alcohol per week. She reports that she does not use drugs. family history includes Asthma in her sister; Cancer in her mother; Colon polyps in her sister. Allergies  Allergen Reactions   Latex     REACTION: rash   Macrobid [Nitrofurantoin Macrocrystal] Swelling and Other (See Comments)    Lips and inside of mouth swelling   Amoxicillin Rash    Has patient had a PCN reaction causing immediate rash, facial/tongue/throat swelling, SOB or lightheadedness with hypotension: unkown Has patient had a PCN reaction causing severe rash involving mucus membranes or skin necrosis: no Has patient had a PCN reaction that required hospitalization No Has patient had a PCN reaction occurring within the last 10 years: unknown If all of  the above answers are "NO", then may proceed with Cephalosporin use.   Current Outpatient Medications on File Prior to Visit  Medication Sig Dispense Refill   Acetaminophen (TYLENOL PO) Take 1,000 mg by mouth daily as needed.     alendronate (FOSAMAX) 70 MG tablet Take 1 tablet (70 mg total) by mouth every 7 (seven) days. Take with a full glass of water on an empty stomach. 12 tablet 3   ALFALFA PO Take by mouth.     b complex vitamins tablet Take 1 tablet by mouth daily.     buPROPion (WELLBUTRIN XL) 300 MG 24 hr tablet Take 1 tablet (300 mg total) by mouth daily. 90  tablet 3   Calcium-Magnesium-Vitamin D (CALCIUM MAGNESIUM PO) Take 4 tablets by mouth daily.      clonazePAM (KLONOPIN) 1 MG tablet 1 tab by mouth once daily as needed 30 tablet 2   cyanocobalamin 100 MCG tablet Take 100 mcg by mouth daily.     doxycycline (VIBRAMYCIN) 100 MG capsule Take 1 capsule (100 mg total) by mouth 2 (two) times daily. 20 capsule 0   fluconazole (DIFLUCAN) 150 MG tablet Take 1 tablet (150 mg total) by mouth once a week. 6 tablet 0   ibuprofen (ADVIL) 200 MG tablet Take 200 mg by mouth every 6 (six) hours as needed.     metoprolol succinate (TOPROL-XL) 25 MG 24 hr tablet Take 0.5 tablets (12.5 mg total) by mouth 2 (two) times daily. 90 tablet 3   metoprolol tartrate (LOPRESSOR) 50 MG tablet Take 1 tablet (50 mg total) by mouth 2 (two) times daily as needed. 180 tablet 3   Multiple Vitamin (MULTIVITAMIN) capsule Take 1 capsule by mouth daily.     PREVIDENT 5000 SENSITIVE 1.1-5 % GEL See admin instructions.     Probiotic Product (ALIGN) 4 MG CAPS Take 1 capsule by mouth daily.     tretinoin (RETIN-A) 0.025 % cream Apply topically.     zinc gluconate 50 MG tablet Take 50 mg by mouth daily.     [DISCONTINUED] estrogen, conjugated,-medroxyprogesterone (PREMPRO) 0.625-2.5 MG per tablet Take 1 tablet by mouth daily. 28 tablet 11   No current facility-administered medications on file prior to visit.        ROS:  All others reviewed and negative.  Objective        PE:  BP 122/70 (BP Location: Right Arm, Patient Position: Sitting, Cuff Size: Normal)   Pulse 65   Temp 98.4 F (36.9 C) (Oral)   Ht 5' 6.5" (1.689 m)   Wt 137 lb (62.1 kg)   LMP 07/10/2012   SpO2 99%   BMI 21.78 kg/m                 Constitutional: Pt appears in NAD               HENT: Head: NCAT.                Right Ear: External ear normal.                 Left Ear: External ear normal.                Eyes: . Pupils are equal, round, and reactive to light. Conjunctivae and EOM are normal                Nose: without d/c or deformity               Neck:  Neck supple. Gross normal ROM               Cardiovascular: Normal rate and regular rhythm.                 Pulmonary/Chest: Effort normal and breath sounds without rales or wheezing.                Abd:  Soft, NT, ND, + BS, no organomegaly               Neurological: Pt is alert. At baseline orientation, motor grossly intact               Skin: Skin is warm. No rashes, no other new lesions, LE edema - none               Psychiatric: Pt behavior is normal without agitation , mild nerovus  Micro: none  Cardiac tracings I have personally interpreted today:  none  Pertinent Radiological findings (summarize): none   Lab Results  Component Value Date   WBC 6.4 09/30/2022   HGB 13.9 09/30/2022   HCT 42.8 09/30/2022   PLT 245.0 09/30/2022   GLUCOSE 75 09/30/2022   CHOL 268 (H) 09/30/2022   TRIG 74.0 09/30/2022   HDL 79.90 09/30/2022   LDLDIRECT 137.5 10/06/2012   LDLCALC 173 (H) 09/30/2022   ALT 19 09/30/2022   AST 18 09/30/2022   NA 139 09/30/2022   K 4.2 09/30/2022   CL 102 09/30/2022   CREATININE 0.92 09/30/2022   BUN 23 09/30/2022   CO2 29 09/30/2022   TSH 1.41 09/30/2022   HGBA1C 5.7 09/30/2022   Assessment/Plan:  Shelby Grant is a 67 y.o. White or Caucasian [1] female with  has a past medical history of ALLERGIC RHINITIS (07/03/2008), Allergy, ANXIETY (06/02/2007), Arthritis, BELL'S PALSY, LEFT (12/02/2008), Cancer (HCC) (01/16/2015), CELLULITIS, LEFT LEG (09/14/2008), CHEST PAIN (07/07/2009), Complication of anesthesia (08/28/2014), CORNS AND CALLUSES (07/03/2008), Depression, FOOT PAIN (09/22/2009), History of kidney cancer, Hyperlipidemia, IRREGULAR MENSES (06/03/2008), Neoplasm, OCD (obsessive compulsive disorder), Renal disorder, Stress fracture of ankle (since Jun 29, 2014), and Supraventricular tachycardia (06/02/2007).  Allergic rhinitis Mild to mod, for otc allegra and or nasaocrt asd,  to f/u any worsening  symptoms or concerns  Generalized anxiety disorder Stable, cont current klonopin prn  HLD (hyperlipidemia) Lab Results  Component Value Date   LDLCALC 173 (H) 09/30/2022   Severe uncontrolled,declines statin, for lower chol diet, refer nutrition, and f/u cardiac CT score   Osteoporosis Due for f/u dxa, consider prolia if worsening  Followup: Return in about 6 months (around 04/04/2023).  Oliver Barre, MD 10/08/2022 6:28 AM [facility] Internal Medicine

## 2022-10-05 NOTE — Patient Instructions (Signed)
Ms. Folmer , Thank you for taking time to come for your Medicare Wellness Visit. I appreciate your ongoing commitment to your health goals. Please review the following plan we discussed and let me know if I can assist you in the future.   Referrals/Orders/Follow-Ups/Clinician Recommendations: Please remember to call and get scheduled for a annual eye exam and get your Bone Density screening soon.  Aim for 30 minutes of exercise or brisk walking, 6-8 glasses of water, and 5 servings of fruits and vegetables each day.   This is a list of the screening recommended for you and due dates:  Health Maintenance  Topic Date Due   Zoster (Shingles) Vaccine (1 of 2) 07/04/1975   COVID-19 Vaccine (3 - Pfizer risk series) 06/19/2019   Flu Shot  09/02/2022   Mammogram  09/27/2022   Medicare Annual Wellness Visit  10/05/2023   Colon Cancer Screening  07/05/2024   DTaP/Tdap/Td vaccine (4 - Td or Tdap) 08/10/2028   Pneumonia Vaccine  Completed   DEXA scan (bone density measurement)  Completed   Hepatitis C Screening  Completed   HPV Vaccine  Aged Out    Advanced directives: (In Chart) A copy of your advanced directives are scanned into your chart should your provider ever need it.  Next Medicare Annual Wellness Visit scheduled for next year: Yes

## 2022-10-05 NOTE — Progress Notes (Signed)
Subjective:   Shelby Grant is a 67 y.o. female who presents for an Initial Medicare Annual Wellness Visit.  Visit Complete: Virtual  I connected with  Shelby Grant on 10/05/22 by a audio enabled telemedicine application and verified that I am speaking with the correct person using two identifiers.  Patient Location: Home  Provider Location: Home Office  I discussed the limitations of evaluation and management by telemedicine. The patient expressed understanding and agreed to proceed.  Vital Signs: Because this visit was a virtual/telehealth visit, some criteria may be missing or patient reported. Any vitals not documented were not able to be obtained and vitals that have been documented are patient reported.    Review of Systems    Cardiac Risk Factors include: advanced age (>67men, >25 women);Other (see comment), Risk factor comments: Osteoporosis     Objective:    Today's Vitals   10/05/22 0822  Weight: 135 lb (61.2 kg)  Height: 5' 6.5" (1.689 m)   Body mass index is 21.46 kg/m.     10/05/2022    8:27 AM 05/26/2022    1:21 PM 10/15/2021    7:43 PM 01/25/2015   12:14 AM 01/06/2015    7:00 PM 01/06/2015    5:48 AM 01/02/2015    2:14 PM  Advanced Directives  Does Patient Have a Medical Advance Directive? Yes No No Yes Yes  Yes  Type of Estate agent of [place];Living will      Healthcare Power of [place];Living will  Does patient want to make changes to medical advance directive? No - Patient declined        Copy of Healthcare Power of Attorney in Chart? Yes - validated most recent copy scanned in chart (See row information)   Yes  Yes No - copy requested  Would patient like information on creating a medical advance directive?  No - Patient declined No - Patient declined        Current Medications (verified) Outpatient Encounter Medications as of 10/05/2022  Medication Sig   Acetaminophen (TYLENOL PO) Take 1,000 mg by mouth daily as needed.    alendronate (FOSAMAX) 70 MG tablet Take 1 tablet (70 mg total) by mouth every 7 (seven) days. Take with a full glass of water on an empty stomach.   ALFALFA PO Take by mouth.   b complex vitamins tablet Take 1 tablet by mouth daily.   buPROPion (WELLBUTRIN XL) 300 MG 24 hr tablet Take 1 tablet (300 mg total) by mouth daily.   Calcium-Magnesium-Vitamin D (CALCIUM MAGNESIUM PO) Take 4 tablets by mouth daily.    clonazePAM (KLONOPIN) 1 MG tablet 1 tab by mouth once daily as needed   cyanocobalamin 100 MCG tablet Take 100 mcg by mouth daily.   doxycycline (VIBRAMYCIN) 100 MG capsule Take 1 capsule (100 mg total) by mouth 2 (two) times daily.   fluconazole (DIFLUCAN) 150 MG tablet Take 1 tablet (150 mg total) by mouth once a week.   ibuprofen (ADVIL) 200 MG tablet Take 200 mg by mouth every 6 (six) hours as needed.   metoprolol succinate (TOPROL-XL) 25 MG 24 hr tablet Take 0.5 tablets (12.5 mg total) by mouth 2 (two) times daily.   metoprolol tartrate (LOPRESSOR) 50 MG tablet Take 1 tablet (50 mg total) by mouth 2 (two) times daily as needed.   Multiple Vitamin (MULTIVITAMIN) capsule Take 1 capsule by mouth daily.   PREVIDENT 5000 SENSITIVE 1.1-5 % GEL See admin instructions.   Probiotic Product (ALIGN)  4 MG CAPS Take 1 capsule by mouth daily.   tretinoin (RETIN-A) 0.025 % cream Apply topically.   zinc gluconate 50 MG tablet Take 50 mg by mouth daily.   [DISCONTINUED] estrogen, conjugated,-medroxyprogesterone (PREMPRO) 0.625-2.5 MG per tablet Take 1 tablet by mouth daily.   No facility-administered encounter medications on file as of 10/05/2022.    Allergies (verified) Latex, Macrobid [nitrofurantoin macrocrystal], and Amoxicillin   History: Past Medical History:  Diagnosis Date   ALLERGIC RHINITIS 07/03/2008   Allergy    seasonal   ANXIETY 06/02/2007   Arthritis    BELL'S PALSY, LEFT 12/02/2008   Cancer (HCC) 01/16/2015   kidney   CELLULITIS, LEFT LEG 09/14/2008   CHEST PAIN 07/07/2009    Complication of anesthesia 08/28/2014   "work up in recovery room with jaw locked open"   CORNS AND CALLUSES 07/03/2008   Depression    FOOT PAIN 09/22/2009   History of kidney cancer    Hyperlipidemia    IRREGULAR MENSES 06/03/2008   Neoplasm    left renal   OCD (obsessive compulsive disorder)    Renal disorder    Stress fracture of ankle since Jun 29, 2014   left   Supraventricular tachycardia 06/02/2007   Atrial tachycardia with Wenckebach periodicity   Past Surgical History:  Procedure Laterality Date   APPENDECTOMY  20016   BLEPHAROPLASTY Bilateral 10/31/2017   COLONOSCOPY  2009   HIP ARTHROPLASTY Right 11/18/2017   LAPAROSCOPIC APPENDECTOMY N/A 08/28/2014   Procedure: APPENDECTOMY LAPAROSCOPIC;  Surgeon: Jimmye Norman, MD;  Location: [facility];  Service: General;  Laterality: N/A;   ROBOTIC ASSITED PARTIAL NEPHRECTOMY Left 01/06/2015   Procedure: ROBOTIC ASSISTED PARTIAL NEPHRECTOMY;  Surgeon: Heloise Purpura, MD;  Location: [facility];  Service: Urology;  Laterality: Left;   WISDOM TOOTH EXTRACTION     Family History  Problem Relation Age of Onset   Asthma Sister    Colon polyps Sister    Cancer Mother        Unsure where cancer started    Colon cancer Neg Hx    Esophageal cancer Neg Hx    Rectal cancer Neg Hx    Stomach cancer Neg Hx    Social History   Socioeconomic History   Marital status: Married    Spouse name: Fayrene Fearing   Number of children: 1   Years of education: Not on file   Highest education level: Not on file  Occupational History   Occupation: Hospital doctor: [employer]  Tobacco Use   Smoking status: Never   Smokeless tobacco: Never  Vaping Use   Vaping status: Never Used  Substance and Sexual Activity   Alcohol use: Yes    Alcohol/week: 2.0 standard drinks of alcohol    Types: 1 Glasses of wine, 1 Shots of liquor per week    Comment: social    Drug use: No   Sexual activity: Yes    Comment: married  Other Topics Concern    Not on file  Social History Narrative   No caffeine    Step- son.   Social Determinants of Health   Financial Resource Strain: Low Risk  (10/05/2022)   Overall Financial Resource Strain (CARDIA)    Difficulty of Paying Living Expenses: Not hard at all  Food Insecurity: No Food Insecurity (10/05/2022)   Hunger Vital Sign    Worried About Running Out of Food in the Last Year: Never true    Ran Out of Food in  the Last Year: Never true  Transportation Needs: No Transportation Needs (10/05/2022)   PRAPARE - Administrator, Civil Service (Medical): No    Lack of Transportation (Non-Medical): No  Physical Activity: Insufficiently Active (10/05/2022)   Exercise Vital Sign    Days of Exercise per Week: 3 days    Minutes of Exercise per Session: 20 min  Stress: No Stress Concern Present (10/05/2022)   [employer] of Occupational Health - Occupational Stress Questionnaire    Feeling of Stress : Not at all  Social Connections: Moderately Integrated (10/05/2022)   Social Connection and Isolation Panel [NHANES]    Frequency of Communication with Friends and Family: More than three times a week    Frequency of Social Gatherings with Friends and Family: More than three times a week    Attends Religious Services: More than 4 times per year    Active Member of [employer] or Organizations: No    Attends Engineer, structural: Never    Marital Status: Married    Tobacco Counseling Counseling given: Not Answered   Clinical Intake:  Pre-visit preparation completed: Yes  Pain : No/denies pain     BMI - recorded: 21.46 Nutritional Status: BMI of 19-24  Normal Nutritional Risks: None Diabetes: No  How often do you need to have someone help you when you read instructions, pamphlets, or other written materials from your doctor or pharmacy?: 1 - Never  Interpreter Needed?: No      Activities of Daily Living    10/05/2022    8:24 AM  In your present state of health, do you  have any difficulty performing the following activities:  Hearing? 1  Comment Hearing aides  Vision? 0  Difficulty concentrating or making decisions? 0  Walking or climbing stairs? 0  Dressing or bathing? 0  Doing errands, shopping? 0  Preparing Food and eating ? N  Using the Toilet? N  In the past six months, have you accidently leaked urine? N  Do you have problems with loss of bowel control? N  Managing your Medications? N  Managing your Finances? N  Housekeeping or managing your Housekeeping? N    Patient Care Team: Corwin Levins, MD as PCP - General (Internal Medicine) Hyacinth Meeker Vision Specialist Sharyn Creamer, MD) (Ophthalmology)  Indicate any recent Medical Services you may have received from other than [facility] providers in the past year (date may be approximate).     Assessment:   This is a routine wellness examination for [place].  Hearing/Vision screen Hearing Screening - Comments:: Wears hearing aides Vision Screening - Comments:: Wears Contacts  Dietary issues and exercise activities discussed:     Goals Addressed   None   Depression Screen    10/05/2022    8:31 AM 06/01/2022    2:10 PM 03/30/2022    3:31 PM 09/29/2021    8:13 AM 09/26/2020    8:37 AM 09/26/2020    8:12 AM 09/18/2019    1:38 PM  PHQ 2/9 Scores  PHQ - 2 Score 0 0 0 0 0 0 0  PHQ- 9 Score 0  0        Fall Risk    10/05/2022    8:27 AM 06/01/2022    2:10 PM 09/29/2021    8:09 AM 09/26/2020    8:37 AM 09/26/2020    8:12 AM  Fall Risk   Falls in the past year? 1 0 0 0 0  Number falls in past yr: 0  0  0 0  Injury with Fall? 1 0  0 0  Comment sprained ankle      Risk for fall due to :  No Fall Risks     Follow up Falls evaluation completed;Falls prevention discussed Falls evaluation completed       MEDICARE RISK AT HOME: Medicare Risk at Home Any stairs in or around the home?: Yes If so, are there any without handrails?: Yes Home free of loose throw rugs in walkways, pet beds, electrical  cords, etc?: Yes Adequate lighting in your home to reduce risk of falls?: Yes Life alert?: No Use of a cane, walker or w/c?: No Grab bars in the bathroom?: Yes Shower chair or bench in shower?: Yes Elevated toilet seat or a handicapped toilet?: No  TIMED UP AND GO:  Was the test performed? No    Cognitive Function:        10/05/2022    8:48 AM  6CIT Screen  What Year? 0 points  What month? 0 points  What time? 0 points  Count back from 20 0 points  Months in reverse 0 points  Repeat phrase 0 points  Total Score 0 points    Immunizations Immunization History  Administered Date(s) Administered   Influenza Whole 11/07/2007, 12/02/2008, 10/29/2009, 11/10/2011   Influenza, High Dose Seasonal PF 09/11/2021   Influenza, Seasonal, Injecte, Preservative Fre 11/25/2016   Influenza,inj,Quad PF,6+ Mos 10/10/2012, 10/15/2014, 11/04/2017, 12/15/2018   Influenza,inj,quad, With Preservative 12/02/2016   Influenza-Unspecified 11/02/2013, 11/02/2014, 09/13/2015, 10/03/2015   PFIZER(Purple Top)SARS-COV-2 Vaccination 04/27/2019, 05/22/2019   PNEUMOCOCCAL CONJUGATE-20 09/29/2021   Td 11/03/1998, 06/03/2008   Tdap 08/11/2018   Zoster, Live 09/13/2015    TDAP status: Up to date  Flu Vaccine status: Due, Education has been provided regarding the importance of this vaccine. Advised may receive this vaccine at local pharmacy or Health Dept. Aware to provide a copy of the vaccination record if obtained from local pharmacy or Health Dept. Verbalized acceptance and understanding.  Pneumococcal vaccine status: Up to date  Covid-19 vaccine status: Completed vaccines  Qualifies for Shingles Vaccine? Yes   Zostavax completed Yes   Shingrix Completed?: No.    Education has been provided regarding the importance of this vaccine. Patient has been advised to call insurance company to determine out of pocket expense if they have not yet received this vaccine. Advised may also receive vaccine at local  pharmacy or Health Dept. Verbalized acceptance and understanding.  Screening Tests Health Maintenance  Topic Date Due   Zoster Vaccines- Shingrix (1 of 2) 07/04/1975   COVID-19 Vaccine (3 - Pfizer risk series) 06/19/2019   INFLUENZA VACCINE  09/02/2022   MAMMOGRAM  09/27/2022   Medicare Annual Wellness (AWV)  10/05/2023   Colonoscopy  07/05/2024   DTaP/Tdap/Td (4 - Td or Tdap) 08/10/2028   Pneumonia Vaccine 75+ Years old  Completed   DEXA SCAN  Completed   Hepatitis C Screening  Completed   HPV VACCINES  Aged Out    Health Maintenance  Health Maintenance Due  Topic Date Due   Zoster Vaccines- Shingrix (1 of 2) 07/04/1975   COVID-19 Vaccine (3 - Pfizer risk series) 06/19/2019   INFLUENZA VACCINE  09/02/2022   MAMMOGRAM  09/27/2022    Colorectal cancer screening: Type of screening: Colonoscopy. Completed 07/06/2019. Repeat every 5 years  Mammogram status: Completed 10/16/2020. Repeat every year  Bone Density status: Completed 09/26/2020. Results reflect: Bone density results: OSTEOPOROSIS. Repeat every 2 years.  Lung Cancer Screening: (Low Dose  CT Chest recommended if Age 33-80 years, 20 pack-year currently smoking OR have quit w/in 15years.) does not qualify.   Lung Cancer Screening Referral: N/A  Additional Screening:  Hepatitis C Screening: does qualify; Completed 08/11/2018  Vision Screening: Recommended annual ophthalmology exams for early detection of glaucoma and other disorders of the eye. Is the patient up to date with their annual eye exam?  No  Who is the provider or what is the name of the office in which the patient attends annual eye exams? Dr. Hyacinth Meeker If pt is not established with a provider, would they like to be referred to a provider to establish care? No .   Dental Screening: Recommended annual dental exams for proper oral hygiene   Community Resource Referral / Chronic Care Management: CRR required this visit?  No   CCM required this visit?  No      Plan:     I have personally reviewed and noted the following in the patient's chart:   Medical and social history Use of alcohol, tobacco or illicit drugs  Current medications and supplements including opioid prescriptions. Patient is not currently taking opioid prescriptions. Functional ability and status Nutritional status Physical activity Advanced directives List of other physicians Hospitalizations, surgeries, and ER visits in previous 12 months Vitals Screenings to include cognitive, depression, and falls Referrals and appointments  In addition, I have reviewed and discussed with patient certain preventive protocols, quality metrics, and best practice recommendations. A written personalized care plan for preventive services as well as general preventive health recommendations were provided to patient.     Joelie Schou L Shakari Qazi, CMA   10/05/2022   After Visit Summary: (MyChart) Due to this being a telephonic visit, the after visit summary with patients personalized plan was offered to patient via MyChart   Nurse Notes: Patient is due for a mammogram, which she said she has an appointment coming up next week.  She is also due for her yearly eye examination and a DEXA, which she is aware that she needs to call and schedule for both before the year is out.  Patient declines the Shingrix vaccines.  She had no other concerns to address today.

## 2022-10-08 ENCOUNTER — Encounter: Payer: Self-pay | Admitting: Internal Medicine

## 2022-10-08 NOTE — Assessment & Plan Note (Signed)
Lab Results  Component Value Date   LDLCALC 173 (H) 09/30/2022   Severe uncontrolled,declines statin, for lower chol diet, refer nutrition, and f/u cardiac CT score

## 2022-10-08 NOTE — Assessment & Plan Note (Signed)
Mild to mod, for otc allegra and/or nasaocrt asd, to f/u any worsening symptoms or concerns

## 2022-10-08 NOTE — Assessment & Plan Note (Signed)
Stable, cont current klonopin prn

## 2022-10-08 NOTE — Assessment & Plan Note (Signed)
Due for f/u dxa, consider prolia if worsening

## 2022-10-12 DIAGNOSIS — Z1231 Encounter for screening mammogram for malignant neoplasm of breast: Secondary | ICD-10-CM | POA: Diagnosis not present

## 2022-10-12 LAB — HM MAMMOGRAPHY

## 2022-10-15 ENCOUNTER — Other Ambulatory Visit: Payer: Self-pay

## 2022-10-15 ENCOUNTER — Other Ambulatory Visit: Payer: Self-pay | Admitting: Internal Medicine

## 2022-10-28 DIAGNOSIS — L905 Scar conditions and fibrosis of skin: Secondary | ICD-10-CM | POA: Diagnosis not present

## 2022-10-28 DIAGNOSIS — L821 Other seborrheic keratosis: Secondary | ICD-10-CM | POA: Diagnosis not present

## 2022-11-02 DIAGNOSIS — M7532 Calcific tendinitis of left shoulder: Secondary | ICD-10-CM | POA: Diagnosis not present

## 2022-11-09 ENCOUNTER — Ambulatory Visit (HOSPITAL_BASED_OUTPATIENT_CLINIC_OR_DEPARTMENT_OTHER)
Admission: RE | Admit: 2022-11-09 | Discharge: 2022-11-09 | Disposition: A | Discharge status: Home / Self Care | Payer: PPO | Source: Ambulatory Visit | Attending: Internal Medicine | Admitting: Internal Medicine

## 2022-11-09 DIAGNOSIS — E7800 Pure hypercholesterolemia, unspecified: Secondary | ICD-10-CM | POA: Insufficient documentation

## 2022-11-09 DIAGNOSIS — R9431 Abnormal electrocardiogram [ECG] [EKG]: Secondary | ICD-10-CM | POA: Insufficient documentation

## 2022-11-15 NOTE — Progress Notes (Signed)
HPI: Follow-up SVT.  Patient has been seen in the past by Dr. Graciela Husbands for atrial tachycardia associated with Mobitz 1 periodicity. Echocardiogram June 2024 showed normal LV function, grade 1 diastolic dysfunction.  Calcium score October 2024 0  Patient has longstanding episodes of SVT.  Since last seen she denies dyspnea, chest pain or syncope.  She has not had any recurrent bouts of SVT.  Current Outpatient Medications  Medication Sig Dispense Refill   Acetaminophen (TYLENOL PO) Take 1,000 mg by mouth daily as needed.     alendronate (FOSAMAX) 70 MG tablet Take 1 tablet (70 mg total) by mouth every 7 (seven) days. Take with a full glass of water on an empty stomach. 12 tablet 3   ALFALFA PO Take by mouth.     b complex vitamins tablet Take 1 tablet by mouth daily.     buPROPion (WELLBUTRIN XL) 300 MG 24 hr tablet Take 1 tablet (300 mg total) by mouth daily. 90 tablet 3   Calcium-Magnesium-Vitamin D (CALCIUM MAGNESIUM PO) Take 4 tablets by mouth daily.      clonazePAM (KLONOPIN) 1 MG tablet 1 tab by mouth once daily as needed 30 tablet 2   cyanocobalamin 100 MCG tablet Take 100 mcg by mouth daily.     ibuprofen (ADVIL) 200 MG tablet Take 200 mg by mouth every 6 (six) hours as needed.     metoprolol succinate (TOPROL-XL) 25 MG 24 hr tablet TAKE 1/2 TABLET(12.5 MG) BY MOUTH TWICE DAILY 90 tablet 3   metoprolol tartrate (LOPRESSOR) 50 MG tablet Take 1 tablet (50 mg total) by mouth 2 (two) times daily as needed. 180 tablet 3   Multiple Vitamin (MULTIVITAMIN) capsule Take 1 capsule by mouth daily.     PREVIDENT 5000 SENSITIVE 1.1-5 % GEL See admin instructions.     Probiotic Product (ALIGN) 4 MG CAPS Take 1 capsule by mouth daily.     zinc gluconate 50 MG tablet Take 50 mg by mouth daily.     No current facility-administered medications for this visit.     Past Medical History:  Diagnosis Date   ALLERGIC RHINITIS 07/03/2008   Allergy    seasonal   ANXIETY 06/02/2007   Arthritis     BELL'S PALSY, LEFT 12/02/2008   Cancer (HCC) 01/16/2015   kidney   CELLULITIS, LEFT LEG 09/14/2008   CHEST PAIN 07/07/2009   Complication of anesthesia 08/28/2014   "work up in recovery room with jaw locked open"   CORNS AND CALLUSES 07/03/2008   Depression    FOOT PAIN 09/22/2009   History of kidney cancer    Hyperlipidemia    IRREGULAR MENSES 06/03/2008   Neoplasm    left renal   OCD (obsessive compulsive disorder)    Renal disorder    Stress fracture of ankle since Jun 29, 2014   left   Supraventricular tachycardia (HCC) 06/02/2007   Atrial tachycardia with Wenckebach periodicity    Past Surgical History:  Procedure Laterality Date   APPENDECTOMY  20016   BLEPHAROPLASTY Bilateral 10/31/2017   COLONOSCOPY  2009   HIP ARTHROPLASTY Right 11/18/2017   LAPAROSCOPIC APPENDECTOMY N/A 08/28/2014   Procedure: APPENDECTOMY LAPAROSCOPIC;  Surgeon: Jimmye Norman, MD;  Location: [facility] OR;  Service: General;  Laterality: N/A;   ROBOTIC ASSITED PARTIAL NEPHRECTOMY Left 01/06/2015   Procedure: ROBOTIC ASSISTED PARTIAL NEPHRECTOMY;  Surgeon: Heloise Purpura, MD;  Location: [facility];  Service: Urology;  Laterality: Left;   WISDOM TOOTH EXTRACTION  Social History   Socioeconomic History   Marital status: Married    Spouse name: Fayrene Fearing   Number of children: 1   Years of education: Not on file   Highest education level: Not on file  Occupational History   Occupation: Hospital doctor: [employer]  Tobacco Use   Smoking status: Never   Smokeless tobacco: Never  Vaping Use   Vaping status: Never Used  Substance and Sexual Activity   Alcohol use: Yes    Alcohol/week: 2.0 standard drinks of alcohol    Types: 1 Glasses of wine, 1 Shots of liquor per week    Comment: social    Drug use: No   Sexual activity: Yes    Comment: married  Other Topics Concern   Not on file  Social History Narrative   No caffeine    Step- son.   Social Determinants of Health    Financial Resource Strain: Low Risk  (10/05/2022)   Overall Financial Resource Strain (CARDIA)    Difficulty of Paying Living Expenses: Not hard at all  Food Insecurity: No Food Insecurity (10/05/2022)   Hunger Vital Sign    Worried About Running Out of Food in the Last Year: Never true    Ran Out of Food in the Last Year: Never true  Transportation Needs: No Transportation Needs (10/05/2022)   PRAPARE - Administrator, Civil Service (Medical): No    Lack of Transportation (Non-Medical): No  Physical Activity: Insufficiently Active (10/05/2022)   Exercise Vital Sign    Days of Exercise per Week: 3 days    Minutes of Exercise per Session: 20 min  Stress: No Stress Concern Present (10/05/2022)   [employer] of Occupational Health - Occupational Stress Questionnaire    Feeling of Stress : Not at all  Social Connections: Moderately Integrated (10/05/2022)   Social Connection and Isolation Panel [NHANES]    Frequency of Communication with Friends and Family: More than three times a week    Frequency of Social Gatherings with Friends and Family: More than three times a week    Attends Religious Services: More than 4 times per year    Active Member of [employer] or Organizations: No    Attends Banker Meetings: Never    Marital Status: Married  Catering manager Violence: Not At Risk (10/05/2022)   Humiliation, Afraid, Rape, and Kick questionnaire    Fear of Current or Ex-Partner: No    Emotionally Abused: No    Physically Abused: No    Sexually Abused: No    Family History  Problem Relation Age of Onset   Asthma Sister    Colon polyps Sister    Cancer Mother        Unsure where cancer started    Colon cancer Neg Hx    Esophageal cancer Neg Hx    Rectal cancer Neg Hx    Stomach cancer Neg Hx     ROS: no fevers or chills, productive cough, hemoptysis, dysphasia, odynophagia, melena, hematochezia, dysuria, hematuria, rash, seizure activity, orthopnea, PND, pedal  edema, claudication. Remaining systems are negative.  Physical Exam: Well-developed well-nourished in no acute distress.  Skin is warm and dry.  HEENT is normal.  Neck is supple.  Chest is clear to auscultation with normal expansion.  Cardiovascular exam is regular rate and rhythm.  Abdominal exam nontender or distended. No masses palpated. Extremities show no edema. neuro grossly intact  A/P  1 palpitations/supraventricular tachycardia-continue  beta-blocker.  Note LV function is normal on echocardiogram.  Patient continues to want to be conservative.  We will consider referral for ablation in the future if she is agreeable.  2 previous minimal elevation in troponin-likely secondary to SVT.  She does not have exertional chest pain and previous calcium score 0.  Will not pursue further ischemia evaluation.  Olga Millers, MD

## 2022-11-17 DIAGNOSIS — D485 Neoplasm of uncertain behavior of skin: Secondary | ICD-10-CM | POA: Diagnosis not present

## 2022-11-17 DIAGNOSIS — L6612 Frontal fibrosing alopecia: Secondary | ICD-10-CM | POA: Diagnosis not present

## 2022-11-17 DIAGNOSIS — L650 Telogen effluvium: Secondary | ICD-10-CM | POA: Diagnosis not present

## 2022-11-17 DIAGNOSIS — L309 Dermatitis, unspecified: Secondary | ICD-10-CM | POA: Diagnosis not present

## 2022-11-18 DIAGNOSIS — M25512 Pain in left shoulder: Secondary | ICD-10-CM | POA: Diagnosis not present

## 2022-11-24 DIAGNOSIS — M25512 Pain in left shoulder: Secondary | ICD-10-CM | POA: Diagnosis not present

## 2022-11-29 ENCOUNTER — Encounter: Payer: Self-pay | Admitting: Cardiology

## 2022-11-29 ENCOUNTER — Ambulatory Visit: Payer: PPO | Attending: Cardiology | Admitting: Cardiology

## 2022-11-29 VITALS — BP 112/62 | HR 66 | Ht 66.5 in | Wt 138.0 lb

## 2022-11-29 DIAGNOSIS — R002 Palpitations: Secondary | ICD-10-CM | POA: Diagnosis not present

## 2022-11-29 DIAGNOSIS — R7989 Other specified abnormal findings of blood chemistry: Secondary | ICD-10-CM | POA: Diagnosis not present

## 2022-11-29 NOTE — Patient Instructions (Signed)
  Follow-Up: At [facility], you and your health needs are our priority.  As part of our continuing mission to provide you with exceptional heart care, we have created designated Provider Care Teams.  These Care Teams include your primary Cardiologist (physician) and Advanced Practice Providers (APPs -  Physician Assistants and Nurse Practitioners) who all work together to provide you with the care you need, when you need it.  We recommend signing up for the patient portal called "MyChart".  Sign up information is provided on this After Visit Summary.  MyChart is used to connect with patients for Virtual Visits (Telemedicine).  Patients are able to view lab/test results, encounter notes, upcoming appointments, etc.  Non-urgent messages can be sent to your provider as well.   To learn more about what you can do with MyChart, go to [email or web address].    Your next appointment:   12 month(s)  Provider:   Brian Crenshaw MD    

## 2022-11-30 DIAGNOSIS — R102 Pelvic and perineal pain: Secondary | ICD-10-CM | POA: Diagnosis not present

## 2022-11-30 DIAGNOSIS — M6281 Muscle weakness (generalized): Secondary | ICD-10-CM | POA: Diagnosis not present

## 2022-11-30 DIAGNOSIS — N941 Unspecified dyspareunia: Secondary | ICD-10-CM | POA: Diagnosis not present

## 2022-11-30 DIAGNOSIS — N3946 Mixed incontinence: Secondary | ICD-10-CM | POA: Diagnosis not present

## 2022-12-02 ENCOUNTER — Other Ambulatory Visit: Payer: Self-pay | Admitting: Internal Medicine

## 2022-12-03 ENCOUNTER — Other Ambulatory Visit: Payer: Self-pay

## 2022-12-23 ENCOUNTER — Telehealth (INDEPENDENT_AMBULATORY_CARE_PROVIDER_SITE_OTHER): Payer: Self-pay | Admitting: Otolaryngology

## 2022-12-23 NOTE — Telephone Encounter (Signed)
Spoke with patient, confirmed location and time

## 2022-12-24 ENCOUNTER — Ambulatory Visit (INDEPENDENT_AMBULATORY_CARE_PROVIDER_SITE_OTHER): Payer: PPO | Admitting: Otolaryngology

## 2022-12-24 ENCOUNTER — Encounter (INDEPENDENT_AMBULATORY_CARE_PROVIDER_SITE_OTHER): Payer: Self-pay

## 2022-12-24 VITALS — Ht 66.0 in | Wt 132.0 lb

## 2022-12-24 DIAGNOSIS — H903 Sensorineural hearing loss, bilateral: Secondary | ICD-10-CM

## 2022-12-24 DIAGNOSIS — H6123 Impacted cerumen, bilateral: Secondary | ICD-10-CM

## 2022-12-24 DIAGNOSIS — H9313 Tinnitus, bilateral: Secondary | ICD-10-CM

## 2022-12-26 DIAGNOSIS — H9313 Tinnitus, bilateral: Secondary | ICD-10-CM | POA: Insufficient documentation

## 2022-12-26 DIAGNOSIS — H6123 Impacted cerumen, bilateral: Secondary | ICD-10-CM | POA: Insufficient documentation

## 2022-12-26 DIAGNOSIS — H903 Sensorineural hearing loss, bilateral: Secondary | ICD-10-CM | POA: Insufficient documentation

## 2022-12-26 NOTE — Progress Notes (Signed)
Patient ID: Shelby Grant, female   DOB: 1955-09-17, 67 y.o.   MRN: [id number]  Follow-up: Bilateral tinnitus, hearing loss, eustachian tube dysfunction.  HPI: The patient is a 67 year old female who returns today for her follow-up evaluation.  The patient was previously seen for bilateral tinnitus, hearing difficulty, and eustachian tube dysfunction.Marland Kitchen  She was noted to have bilateral symmetric high-frequency sensorineural hearing loss.  Her tinnitus was likely a result of her hearing loss.  She was fitted with bilateral hearing aids.  She was also placed on Flonase nasal spray and Valsalva exercise.  The patient returns today reporting resolution of her eustachian tube dysfunction.  She denies any otalgia, otorrhea, or vertigo.  She continues to have bilateral tinnitus.  She denies any recent change in her hearing.  She currently wears bilateral hearing aids.  Exam: General: Communicates without difficulty, well nourished, no acute distress. Head: Normocephalic, no evidence injury, no tenderness, facial buttresses intact without stepoff. Face/sinus: No tenderness to palpation and percussion. Facial movement is normal and symmetric. Eyes: PERRL, EOMI. No scleral icterus, conjunctivae clear. Neuro: CN II exam reveals vision grossly intact.  No nystagmus at any point of gaze. Ears: Auricles well formed without lesions.  Bilateral cerumen impaction nose: External evaluation reveals normal support and skin without lesions.  Dorsum is intact.  Anterior rhinoscopy reveals congested mucosa over anterior aspect of inferior turbinates and intact septum.  No purulence noted. Oral:  Oral cavity and oropharynx are intact, symmetric, without erythema or edema.  Mucosa is moist without lesions. Neck: Full range of motion without pain.  There is no significant lymphadenopathy.  No masses palpable.  Thyroid bed within normal limits to palpation.  Parotid glands and submandibular glands equal bilaterally without mass.   Trachea is midline. Neuro:  CN 2-12 grossly intact.   Procedure: Bilateral cerumen disimpaction Anesthesia: None Description: Under the operating microscope, the cerumen is carefully removed with a combination of cerumen currette, alligator forceps, and suction catheters.  After the cerumen is removed, the TMs are noted to be normal.  No mass, erythema, or lesions. The patient tolerated the procedure well.    Assessment: 1.  Bilateral cerumen impaction.  After the disimpaction procedure, both tympanic membranes and middle ear spaces are noted to be normal. 2.  The patient's clinical eustachian tube dysfunction has improved. 3.  Subjectively stable bilateral high-frequency sensorineural hearing loss. 4.  Bilateral subjective tinnitus, secondary to the hearing loss.  Plan: 1.  Otomicroscopy with bilateral cerumen disimpaction. 2.  The physical exam findings are reviewed with the patient. 3.  Continue the use of her hearing aids. 4.  The strategies to cope with tinnitus, including the use of masker, hearing aids, tinnitus retraining therapy, and avoidance of caffeine and alcohol are discussed.  5.  The patient will return for reevaluation in 1 year.

## 2023-01-05 DIAGNOSIS — M7532 Calcific tendinitis of left shoulder: Secondary | ICD-10-CM | POA: Diagnosis not present

## 2023-01-07 ENCOUNTER — Ambulatory Visit: Payer: PPO | Admitting: Cardiology

## 2023-01-10 ENCOUNTER — Other Ambulatory Visit: Payer: Self-pay | Admitting: Internal Medicine

## 2023-02-18 DIAGNOSIS — L648 Other androgenic alopecia: Secondary | ICD-10-CM | POA: Diagnosis not present

## 2023-02-18 DIAGNOSIS — L821 Other seborrheic keratosis: Secondary | ICD-10-CM | POA: Diagnosis not present

## 2023-02-18 DIAGNOSIS — L6612 Frontal fibrosing alopecia: Secondary | ICD-10-CM | POA: Diagnosis not present

## 2023-02-18 DIAGNOSIS — I788 Other diseases of capillaries: Secondary | ICD-10-CM | POA: Diagnosis not present

## 2023-02-18 DIAGNOSIS — L815 Leukoderma, not elsewhere classified: Secondary | ICD-10-CM | POA: Diagnosis not present

## 2023-02-18 DIAGNOSIS — L810 Postinflammatory hyperpigmentation: Secondary | ICD-10-CM | POA: Diagnosis not present

## 2023-04-02 ENCOUNTER — Encounter: Payer: Self-pay | Admitting: *Deleted

## 2023-04-02 ENCOUNTER — Other Ambulatory Visit: Payer: Self-pay

## 2023-04-02 ENCOUNTER — Ambulatory Visit
Admission: EM | Admit: 2023-04-02 | Discharge: 2023-04-02 | Disposition: A | Discharge status: Home / Self Care | Attending: Family Medicine | Admitting: Family Medicine

## 2023-04-02 ENCOUNTER — Ambulatory Visit (HOSPITAL_BASED_OUTPATIENT_CLINIC_OR_DEPARTMENT_OTHER)
Admission: RE | Admit: 2023-04-02 | Discharge: 2023-04-02 | Disposition: A | Discharge status: Home / Self Care | Source: Ambulatory Visit | Attending: Urgent Care | Admitting: Urgent Care

## 2023-04-02 DIAGNOSIS — S99921A Unspecified injury of right foot, initial encounter: Secondary | ICD-10-CM | POA: Diagnosis not present

## 2023-04-02 DIAGNOSIS — M79674 Pain in right toe(s): Secondary | ICD-10-CM | POA: Diagnosis not present

## 2023-04-02 DIAGNOSIS — M19071 Primary osteoarthritis, right ankle and foot: Secondary | ICD-10-CM | POA: Diagnosis not present

## 2023-04-02 DIAGNOSIS — S90111A Contusion of right great toe without damage to nail, initial encounter: Secondary | ICD-10-CM | POA: Diagnosis not present

## 2023-04-02 MED ORDER — DICLOFENAC SODIUM 1 % EX GEL: 2.0000 g | Freq: Three times a day (TID) | CUTANEOUS | 0 refills | Status: AC

## 2023-04-02 NOTE — ED Triage Notes (Signed)
 States was walking rapidly inside her house when she jammed right great toe against marble door threshold. Denies swelling, but c/o continued pain to distal aspect of right great toe. Ambulates with very slight limp.

## 2023-04-02 NOTE — ED Provider Notes (Signed)
 [facility]  Note:  This document was prepared using Conservation officer, historic buildings and may include unintentional dictation errors.  MRN: [id number] DOB: 1955-07-28  Subjective:   Shelby Grant is a 68 y.o. female presenting for 1 day history of persistent right great toe pain.  Patient reports that she jammed her right great toe against a door threshold.  Has had point tenderness at the very tip of the left great toe.  No damage to the nail.  Is able to bear weight with mild tenderness.  No current facility-administered medications for this encounter.  Current Outpatient Medications:    alendronate (FOSAMAX) 70 MG tablet, TAKE 1 TABLET(70 MG) BY MOUTH EVERY 7 DAYS WITH A FULL GLASS OF WATER AND ON AN EMPTY STOMACH, Disp: 12 tablet, Rfl: 3   clonazePAM (KLONOPIN) 1 MG tablet, TAKE 1 TABLET BY MOUTH DAILY AS NEEDED, Disp: 30 tablet, Rfl: 2   metoprolol succinate (TOPROL-XL) 25 MG 24 hr tablet, TAKE 1/2 TABLET(12.5 MG) BY MOUTH TWICE DAILY, Disp: 90 tablet, Rfl: 3   PREVIDENT 5000 SENSITIVE 1.1-5 % GEL, See admin instructions., Disp: , Rfl:    Probiotic Product (ALIGN) 4 MG CAPS, Take 1 capsule by mouth daily., Disp: , Rfl:    UNABLE TO FIND, Med Name: Multimineral supplement, Disp: , Rfl:    UNABLE TO FIND, Med Name: topical solution to treat brown spots on BLE, Disp: , Rfl:    Acetaminophen (TYLENOL PO), Take 1,000 mg by mouth daily as needed., Disp: , Rfl:    ALFALFA PO, Take by mouth., Disp: , Rfl:    b complex vitamins tablet, Take 1 tablet by mouth daily., Disp: , Rfl:    buPROPion (WELLBUTRIN XL) 300 MG 24 hr tablet, Take 1 tablet (300 mg total) by mouth daily., Disp: 90 tablet, Rfl: 3   Calcium-Magnesium-Vitamin D (CALCIUM MAGNESIUM PO), Take 4 tablets by mouth daily. , Disp: , Rfl:    cyanocobalamin 100 MCG tablet, Take 100 mcg by mouth daily., Disp: , Rfl:    fluocinonide (LIDEX) 0.05 % external solution, APPLY TO THE SCALP TWICE DAILY FOR 2 WEEKS THEN  DECREASE TO EVERY OTHER DAY, Disp: , Rfl:    ibuprofen (ADVIL) 200 MG tablet, Take 200 mg by mouth every 6 (six) hours as needed., Disp: , Rfl:    metoprolol tartrate (LOPRESSOR) 50 MG tablet, Take 1 tablet (50 mg total) by mouth 2 (two) times daily as needed., Disp: 180 tablet, Rfl: 3   Multiple Vitamin (MULTIVITAMIN) capsule, Take 1 capsule by mouth daily., Disp: , Rfl:    zinc gluconate 50 MG tablet, Take 50 mg by mouth daily., Disp: , Rfl:    Allergies  Allergen Reactions   Latex     REACTION: rash   Macrobid [Nitrofurantoin Macrocrystal] Swelling and Other (See Comments)    Lips and inside of mouth swelling   Amoxicillin Rash    Has patient had a PCN reaction causing immediate rash, facial/tongue/throat swelling, SOB or lightheadedness with hypotension: unkown Has patient had a PCN reaction causing severe rash involving mucus membranes or skin necrosis: no Has patient had a PCN reaction that required hospitalization No Has patient had a PCN reaction occurring within the last 10 years: unknown If all of the above answers are "NO", then may proceed with Cephalosporin use.   Meloxicam Rash    Past Medical History:  Diagnosis Date   ALLERGIC RHINITIS 07/03/2008   Allergy    seasonal   ANXIETY 06/02/2007  Arthritis    BELL'S PALSY, LEFT 12/02/2008   Cancer (HCC) 01/16/2015   kidney   CELLULITIS, LEFT LEG 09/14/2008   CHEST PAIN 07/07/2009   Complication of anesthesia 08/28/2014   "work up in recovery room with jaw locked open"   CORNS AND CALLUSES 07/03/2008   Depression    FOOT PAIN 09/22/2009   History of kidney cancer    Hyperlipidemia    IRREGULAR MENSES 06/03/2008   Neoplasm    left renal   OCD (obsessive compulsive disorder)    Renal disorder    Stress fracture of ankle since Jun 29, 2014   left   Supraventricular tachycardia (HCC) 06/02/2007   Atrial tachycardia with Wenckebach periodicity     Past Surgical History:  Procedure Laterality Date    APPENDECTOMY  20016   BLEPHAROPLASTY Bilateral 10/31/2017   COLONOSCOPY  2009   HIP ARTHROPLASTY Right 11/18/2017   LAPAROSCOPIC APPENDECTOMY N/A 08/28/2014   Procedure: APPENDECTOMY LAPAROSCOPIC;  Surgeon: Jimmye Norman, MD;  Location: [facility] OR;  Service: General;  Laterality: N/A;   ROBOTIC ASSITED PARTIAL NEPHRECTOMY Left 01/06/2015   Procedure: ROBOTIC ASSISTED PARTIAL NEPHRECTOMY;  Surgeon: Heloise Purpura, MD;  Location: [facility];  Service: Urology;  Laterality: Left;   WISDOM TOOTH EXTRACTION      Family History  Problem Relation Age of Onset   Cancer Mother        Unsure where cancer started    Asthma Sister    Colon polyps Sister    Colon cancer Neg Hx    Esophageal cancer Neg Hx    Rectal cancer Neg Hx    Stomach cancer Neg Hx     Social History   Tobacco Use   Smoking status: Never   Smokeless tobacco: Never  Vaping Use   Vaping status: Never Used  Substance Use Topics   Alcohol use: Yes    Alcohol/week: 2.0 standard drinks of alcohol    Types: 1 Glasses of wine, 1 Shots of liquor per week    Comment: social    Drug use: No    ROS   Objective:   Vitals: BP 121/71   Pulse 69   Temp 97.9 F (36.6 C) (Oral)   Resp 16   LMP 07/10/2012   SpO2 96%   Physical Exam Constitutional:      General: She is not in acute distress.    Appearance: Normal appearance. She is well-developed. She is not ill-appearing, toxic-appearing or diaphoretic.  HENT:     Head: Normocephalic and atraumatic.     Nose: Nose normal.     Mouth/Throat:     Mouth: Mucous membranes are moist.  Eyes:     General: No scleral icterus.       Right eye: No discharge.        Left eye: No discharge.     Extraocular Movements: Extraocular movements intact.  Cardiovascular:     Rate and Rhythm: Normal rate.  Pulmonary:     Effort: Pulmonary effort is normal.  Musculoskeletal:       Feet:  Skin:    General: Skin is warm and dry.  Neurological:     General: No focal deficit present.      Mental Status: She is alert and oriented to person, place, and time.  Psychiatric:        Mood and Affect: Mood normal.        Behavior: Behavior normal.    DG Toe Great Right Result Date: 04/02/2023 CLINICAL DATA:  Pain injury EXAM: RIGHT GREAT TOE COMPARISON:  None Available. FINDINGS: No definitive fracture or malalignment. Mild degenerative change at the first MTP joint. No radiopaque foreign body in the soft tissues IMPRESSION: No definite acute osseous abnormality Electronically Signed   By: Jasmine Pang M.D.   On: 04/02/2023 18:06    Assessment and Plan :   PDMP not reviewed this encounter.  1. Contusion of right great toe without damage to nail, initial encounter   2. Great toe pain, right    Will manage for contusion of the right great toe with NSAID, postop shoe.  This was applied in clinic.  Counseled patient on potential for adverse effects with medications prescribed/recommended today, ER and return-to-clinic precautions discussed, patient verbalized understanding.    Wallis Bamberg, [place] 04/03/23 [phone]

## 2023-04-02 NOTE — Discharge Instructions (Signed)
 I have placed orders to have an x-ray done at the med center in [facility].  Please check in through the emergency room but do not register to be seen as a patient.  They will contact the radiology department and a staff member will take you to go get the x-ray done. Use the post-op shoe during the day, diclofenac for pain and inflammation. Will update you tomorrow on your x-ray results and any changes necessary.

## 2023-05-12 ENCOUNTER — Ambulatory Visit (INDEPENDENT_AMBULATORY_CARE_PROVIDER_SITE_OTHER): Admitting: Internal Medicine

## 2023-05-12 ENCOUNTER — Encounter: Payer: Self-pay | Admitting: Internal Medicine

## 2023-05-12 VITALS — BP 100/62 | HR 76 | Temp 97.8°F | Resp 18 | Ht 66.5 in | Wt 135.8 lb

## 2023-05-12 DIAGNOSIS — R21 Rash and other nonspecific skin eruption: Secondary | ICD-10-CM | POA: Diagnosis not present

## 2023-05-12 DIAGNOSIS — E7800 Pure hypercholesterolemia, unspecified: Secondary | ICD-10-CM | POA: Diagnosis not present

## 2023-05-12 DIAGNOSIS — J309 Allergic rhinitis, unspecified: Secondary | ICD-10-CM

## 2023-05-12 DIAGNOSIS — F411 Generalized anxiety disorder: Secondary | ICD-10-CM

## 2023-05-12 MED ORDER — METHYLPREDNISOLONE ACETATE 80 MG/ML IJ SUSP
80.0000 mg | Freq: Once | INTRAMUSCULAR | Status: AC
  Administered 2023-05-12: 80 mg via INTRAMUSCULAR

## 2023-05-12 MED ORDER — PREDNISONE 10 MG PO TABS: ORAL_TABLET | ORAL | 0 refills | Status: AC

## 2023-05-12 NOTE — Assessment & Plan Note (Signed)
Overall stable, cont current med tx 

## 2023-05-12 NOTE — Assessment & Plan Note (Signed)
 Mild to mod, c/w contact dermatitis, to stop the lightening cream (azeleic acid), prednisone taper, and depomedrol 80 mg IM,  to f/u any worsening symptoms or concerns

## 2023-05-12 NOTE — Addendum Note (Signed)
 Addended by: Mertha Finders on: 05/12/2023 04:05 PM   Modules accepted: Orders

## 2023-05-12 NOTE — Progress Notes (Signed)
 Patient ID: Shelby Grant, female   DOB: Jul 22, 1955, 68 y.o.   MRN: [id number]        Chief Complaint: follow up rash to legs below the knees bilateral, allergies, hld       HPI:  Shelby Grant is a 68 y.o. female here overall doing ok, except the numerous spots different sizes itchy red lesion to legs only below the knees.  Has been using a lightening cream per dermatology recently to this area only.   Did have visits to other homes and standing in grass but no clear other contact allergen it seems.  Pt denies chest pain, increased sob or doe, wheezing, orthopnea, PND, increased LE swelling, palpitations, dizziness or syncope.   Pt denies polydipsia, polyuria, or new focal neuro s/s.   Does have several wks ongoing nasal allergy symptoms with clearish congestion, itch and sneezing, without fever, pain, ST, cough, swelling or wheezing.       Wt Readings from Last 3 Encounters:  05/12/23 135 lb 12.8 oz (61.6 kg)  12/24/22 132 lb (59.9 kg)  11/29/22 138 lb (62.6 kg)   BP Readings from Last 3 Encounters:  05/12/23 100/62  04/02/23 121/71  11/29/22 112/62         Past Medical History:  Diagnosis Date   ALLERGIC RHINITIS 07/03/2008   Allergy    seasonal   ANXIETY 06/02/2007   Arthritis    BELL'S PALSY, LEFT 12/02/2008   Cancer (HCC) 01/16/2015   kidney   CELLULITIS, LEFT LEG 09/14/2008   CHEST PAIN 07/07/2009   Complication of anesthesia 08/28/2014   "work up in recovery room with jaw locked open"   CORNS AND CALLUSES 07/03/2008   Depression    FOOT PAIN 09/22/2009   History of kidney cancer    Hyperlipidemia    IRREGULAR MENSES 06/03/2008   Neoplasm    left renal   OCD (obsessive compulsive disorder)    Renal disorder    Stress fracture of ankle since Jun 29, 2014   left   Supraventricular tachycardia (HCC) 06/02/2007   Atrial tachycardia with Wenckebach periodicity   Past Surgical History:  Procedure Laterality Date   APPENDECTOMY  20016   BLEPHAROPLASTY Bilateral  10/31/2017   COLONOSCOPY  2009   HIP ARTHROPLASTY Right 11/18/2017   LAPAROSCOPIC APPENDECTOMY N/A 08/28/2014   Procedure: APPENDECTOMY LAPAROSCOPIC;  Surgeon: Jimmye Norman, MD;  Location: [facility] OR;  Service: General;  Laterality: N/A;   ROBOTIC ASSITED PARTIAL NEPHRECTOMY Left 01/06/2015   Procedure: ROBOTIC ASSISTED PARTIAL NEPHRECTOMY;  Surgeon: Heloise Purpura, MD;  Location: [facility];  Service: Urology;  Laterality: Left;   WISDOM TOOTH EXTRACTION      reports that she has never smoked. She has never used smokeless tobacco. She reports current alcohol use of about 2.0 standard drinks of alcohol per week. She reports that she does not use drugs. family history includes Asthma in her sister; Cancer in her mother; Colon polyps in her sister. Allergies  Allergen Reactions   Latex     REACTION: rash   Macrobid [Nitrofurantoin Macrocrystal] Swelling and Other (See Comments)    Lips and inside of mouth swelling   Amoxicillin Rash    Has patient had a PCN reaction causing immediate rash, facial/tongue/throat swelling, SOB or lightheadedness with hypotension: unkown Has patient had a PCN reaction causing severe rash involving mucus membranes or skin necrosis: no Has patient had a PCN reaction that required hospitalization No Has patient had a PCN reaction occurring within the last  10 years: unknown If all of the above answers are "NO", then may proceed with Cephalosporin use.   Meloxicam Rash   Current Outpatient Medications on File Prior to Visit  Medication Sig Dispense Refill   Acetaminophen (TYLENOL PO) Take 1,000 mg by mouth daily as needed.     alendronate (FOSAMAX) 70 MG tablet TAKE 1 TABLET(70 MG) BY MOUTH EVERY 7 DAYS WITH A FULL GLASS OF WATER AND ON AN EMPTY STOMACH 12 tablet 3   ALFALFA PO Take by mouth.     b complex vitamins tablet Take 1 tablet by mouth daily.     buPROPion (WELLBUTRIN XL) 300 MG 24 hr tablet Take 1 tablet (300 mg total) by mouth daily. 90 tablet 3    Calcium-Magnesium-Vitamin D (CALCIUM MAGNESIUM PO) Take 4 tablets by mouth daily.      clonazePAM (KLONOPIN) 1 MG tablet TAKE 1 TABLET BY MOUTH DAILY AS NEEDED 30 tablet 2   cyanocobalamin 100 MCG tablet Take 100 mcg by mouth daily.     diclofenac Sodium (VOLTAREN) 1 % GEL Apply 2 g topically 3 (three) times daily. 100 g 0   fluocinonide (LIDEX) 0.05 % external solution APPLY TO THE SCALP TWICE DAILY FOR 2 WEEKS THEN DECREASE TO EVERY OTHER DAY     ibuprofen (ADVIL) 200 MG tablet Take 200 mg by mouth every 6 (six) hours as needed.     metoprolol succinate (TOPROL-XL) 25 MG 24 hr tablet TAKE 1/2 TABLET(12.5 MG) BY MOUTH TWICE DAILY 90 tablet 3   metoprolol tartrate (LOPRESSOR) 50 MG tablet Take 1 tablet (50 mg total) by mouth 2 (two) times daily as needed. 180 tablet 3   Multiple Vitamin (MULTIVITAMIN) capsule Take 1 capsule by mouth daily.     PREVIDENT 5000 SENSITIVE 1.1-5 % GEL See admin instructions.     Probiotic Product (ALIGN) 4 MG CAPS Take 1 capsule by mouth daily.     UNABLE TO FIND Med Name: Multimineral supplement     UNABLE TO FIND Med Name: topical solution to treat brown spots on BLE     zinc gluconate 50 MG tablet Take 50 mg by mouth daily.     [DISCONTINUED] estrogen, conjugated,-medroxyprogesterone (PREMPRO) 0.625-2.5 MG per tablet Take 1 tablet by mouth daily. 28 tablet 11   No current facility-administered medications on file prior to visit.        ROS:  All others reviewed and negative.  Objective        PE:  BP 100/62 (BP Location: Left Arm, Patient Position: Sitting)   Pulse 76   Temp 97.8 F (36.6 C) (Temporal)   Resp 18   Ht 5' 6.5" (1.689 m)   Wt 135 lb 12.8 oz (61.6 kg)   LMP 07/10/2012   SpO2 99%   BMI 21.59 kg/m                 Constitutional: Pt appears in NAD               HENT: Head: NCAT.                Right Ear: External ear normal.                 Left Ear: External ear normal.                Eyes: . Pupils are equal, round, and reactive to  light. Conjunctivae and EOM are normal  Nose: without d/c or deformity               Neck: Neck supple. Gross normal ROM               Cardiovascular: Normal rate and regular rhythm.                 Pulmonary/Chest: Effort normal and breath sounds without rales or wheezing.                               Neurological: Pt is alert. At baseline orientation, motor grossly intact               Skin: Skin is warm. LE edema - none, numerous spots different sizes itchy red lesion to legs only below the knees.               Psychiatric: Pt behavior is normal without agitation , mild nervous  Micro: none  Cardiac tracings I have personally interpreted today:  none  Pertinent Radiological findings (summarize): none   Lab Results  Component Value Date   WBC 6.4 09/30/2022   HGB 13.9 09/30/2022   HCT 42.8 09/30/2022   PLT 245.0 09/30/2022   GLUCOSE 75 09/30/2022   CHOL 268 (H) 09/30/2022   TRIG 74.0 09/30/2022   HDL 79.90 09/30/2022   LDLDIRECT 137.5 10/06/2012   LDLCALC 173 (H) 09/30/2022   ALT 19 09/30/2022   AST 18 09/30/2022   NA 139 09/30/2022   K 4.2 09/30/2022   CL 102 09/30/2022   CREATININE 0.92 09/30/2022   BUN 23 09/30/2022   CO2 29 09/30/2022   TSH 1.41 09/30/2022   HGBA1C 5.7 09/30/2022   Assessment/Plan:  Shelby Grant is a 68 y.o. White or Caucasian [1] female with  has a past medical history of ALLERGIC RHINITIS (07/03/2008), Allergy, ANXIETY (06/02/2007), Arthritis, BELL'S PALSY, LEFT (12/02/2008), Cancer (HCC) (01/16/2015), CELLULITIS, LEFT LEG (09/14/2008), CHEST PAIN (07/07/2009), Complication of anesthesia (08/28/2014), CORNS AND CALLUSES (07/03/2008), Depression, FOOT PAIN (09/22/2009), History of kidney cancer, Hyperlipidemia, IRREGULAR MENSES (06/03/2008), Neoplasm, OCD (obsessive compulsive disorder), Renal disorder, Stress fracture of ankle (since Jun 29, 2014), and Supraventricular tachycardia (HCC) (06/02/2007).  Allergic rhinitis Also to  improve with prednisone taper  HLD (hyperlipidemia) Lab Results  Component Value Date   LDLCALC 173 (H) 09/30/2022   severe, declines statin, for lower chol diet   Generalized anxiety disorder Overall stable, cont current med tx  Rash Mild to mod, c/w contact dermatitis, to stop the lightening cream (azeleic acid), prednisone taper, and depomedrol 80 mg IM,  to f/u any worsening symptoms or concerns  Followup: Return if symptoms worsen or fail to improve.  Oliver Barre, MD 05/12/2023 4:00 PM [facility]

## 2023-05-12 NOTE — Assessment & Plan Note (Signed)
 Lab Results  Component Value Date   LDLCALC 173 (H) 09/30/2022   severe, declines statin, for lower chol diet

## 2023-05-12 NOTE — Patient Instructions (Signed)
 Please to stop the lightening cream  You had the steroid shot today  Please take all new medication as prescribed  - the prednisone  Please continue all other medications as before, and refills have been done if requested.  Please have the pharmacy call with any other refills you may need.  Please keep your appointments with your specialists as you may have planned

## 2023-05-12 NOTE — Assessment & Plan Note (Signed)
Also to improve with prednisone taper 

## 2023-05-17 DIAGNOSIS — M79672 Pain in left foot: Secondary | ICD-10-CM | POA: Diagnosis not present

## 2023-05-17 DIAGNOSIS — M79671 Pain in right foot: Secondary | ICD-10-CM | POA: Diagnosis not present

## 2023-05-31 DIAGNOSIS — H2513 Age-related nuclear cataract, bilateral: Secondary | ICD-10-CM | POA: Diagnosis not present

## 2023-05-31 DIAGNOSIS — H53143 Visual discomfort, bilateral: Secondary | ICD-10-CM | POA: Diagnosis not present

## 2023-06-02 DIAGNOSIS — M722 Plantar fascial fibromatosis: Secondary | ICD-10-CM | POA: Diagnosis not present

## 2023-06-17 ENCOUNTER — Encounter (HOSPITAL_COMMUNITY): Payer: Self-pay

## 2023-06-17 ENCOUNTER — Ambulatory Visit (HOSPITAL_COMMUNITY)
Admission: RE | Admit: 2023-06-17 | Discharge: 2023-06-17 | Disposition: A | Discharge status: Home / Self Care | Source: Ambulatory Visit | Attending: Physician Assistant | Admitting: Physician Assistant

## 2023-06-17 VITALS — BP 111/69 | HR 65 | Temp 97.9°F | Resp 16

## 2023-06-17 DIAGNOSIS — L237 Allergic contact dermatitis due to plants, except food: Secondary | ICD-10-CM | POA: Diagnosis not present

## 2023-06-17 MED ORDER — PREDNISONE 20 MG PO TABS: ORAL_TABLET | ORAL | 0 refills | Status: AC

## 2023-06-17 NOTE — ED Provider Notes (Signed)
 [facility]    CSN: [id number] Arrival date & time: 06/17/23  1325      History   Chief Complaint Chief Complaint  Patient presents with   Rash    HPI Shelby Grant is a 68 y.o. female.   Patient here concerned with facial rash x 2 days.  Located L side face and neck.  Started after doing lawn work, pulling vines.  She reports itching.  Denies pain, tenderness.  No ocular involvement - no eye pain, vision changes, blurry vision, double vision.    Past Medical History:  Diagnosis Date   ALLERGIC RHINITIS 07/03/2008   Allergy    seasonal   ANXIETY 06/02/2007   Arthritis    BELL'S PALSY, LEFT 12/02/2008   Cancer (HCC) 01/16/2015   kidney   CELLULITIS, LEFT LEG 09/14/2008   CHEST PAIN 07/07/2009   Complication of anesthesia 08/28/2014   "work up in recovery room with jaw locked open"   CORNS AND CALLUSES 07/03/2008   Depression    FOOT PAIN 09/22/2009   History of kidney cancer    Hyperlipidemia    IRREGULAR MENSES 06/03/2008   Neoplasm    left renal   OCD (obsessive compulsive disorder)    Renal disorder    Stress fracture of ankle since Jun 29, 2014   left   Supraventricular tachycardia (HCC) 06/02/2007   Atrial tachycardia with Wenckebach periodicity    Patient Active Problem List   Diagnosis Date Noted   Rash 05/12/2023   Tinnitus of both ears 12/26/2022   Sensorineural hearing loss, bilateral 12/26/2022   Impacted cerumen of both ears 12/26/2022   Chest pain 06/03/2022   Sprain of right wrist 03/11/2022   Sprain of left ankle 01/21/2022   Acute dysfunction of left eustachian tube 09/30/2021   Left hip pain 07/09/2021   Osteoporosis 09/22/2019   Loss of transverse plantar arch 12/04/2018   Renal cell cancer (HCC) 08/11/2018   Refusal of blood transfusions as patient is Jehovah's Witness 08/11/2018   HLD (hyperlipidemia) 08/11/2018   Encounter for screening for cervical cancer 03/29/2017   Encounter for well adult exam with abnormal  findings 03/29/2017   Colon cancer screening 12/04/2015   Open bite of right thumb without damage to nail 08/04/2015   Menopause syndrome 02/11/2011   Allergic rhinitis 07/03/2008   Supraventricular tachycardia (HCC) 06/02/2007   Generalized anxiety disorder 06/02/2007    Past Surgical History:  Procedure Laterality Date   APPENDECTOMY  20016   BLEPHAROPLASTY Bilateral 10/31/2017   COLONOSCOPY  2009   HIP ARTHROPLASTY Right 11/18/2017   LAPAROSCOPIC APPENDECTOMY N/A 08/28/2014   Procedure: APPENDECTOMY LAPAROSCOPIC;  Surgeon: Jerryl Morin, MD;  Location: [facility];  Service: General;  Laterality: N/A;   ROBOTIC ASSITED PARTIAL NEPHRECTOMY Left 01/06/2015   Procedure: ROBOTIC ASSISTED PARTIAL NEPHRECTOMY;  Surgeon: Florencio Hunting, MD;  Location: [facility];  Service: Urology;  Laterality: Left;   WISDOM TOOTH EXTRACTION      OB History   No obstetric history on file.      Home Medications    Prior to Admission medications   Medication Sig Start Date End Date Taking? Authorizing Provider  predniSONE  (DELTASONE ) 20 MG tablet Take 3 tablets (60 mg total) by mouth daily for 3 days, THEN 2 tablets (40 mg total) daily for 3 days, THEN 1 tablet (20 mg total) daily for 3 days. 06/17/23 06/26/23 Yes Lavonia Powers, PA-C  Acetaminophen  (TYLENOL  PO) Take 1,000 mg by mouth daily as needed.  [provider]  alendronate  (FOSAMAX ) 70 MG tablet TAKE 1 TABLET(70 MG) BY MOUTH EVERY 7 DAYS WITH A FULL GLASS OF WATER  AND ON AN EMPTY STOMACH 12/03/22   Roslyn Coombe, MD  ALFALFA PO Take by mouth.    [provider]  b complex vitamins tablet Take 1 tablet by mouth daily.    [provider]  buPROPion  (WELLBUTRIN  XL) 300 MG 24 hr tablet Take 1 tablet (300 mg total) by mouth daily. 06/01/22   Roslyn Coombe, MD  Calcium -Magnesium -Vitamin D  (CALCIUM  MAGNESIUM  PO) Take 4 tablets by mouth daily.     [provider]  clonazePAM  (KLONOPIN ) 1 MG tablet TAKE 1 TABLET BY MOUTH DAILY AS  NEEDED 01/10/23   Roslyn Coombe, MD  cyanocobalamin  100 MCG tablet Take 100 mcg by mouth daily.    [provider]  diclofenac  Sodium (VOLTAREN ) 1 % GEL Apply 2 g topically 3 (three) times daily. 04/02/23   Adolph Hoop, PA-C  fluocinonide (LIDEX) 0.05 % external solution APPLY TO THE SCALP TWICE DAILY FOR 2 WEEKS THEN DECREASE TO EVERY OTHER DAY    [provider]  ibuprofen (ADVIL) 200 MG tablet Take 200 mg by mouth every 6 (six) hours as needed.    [provider]  metoprolol  succinate (TOPROL -XL) 25 MG 24 hr tablet TAKE 1/2 TABLET(12.5 MG) BY MOUTH TWICE DAILY 10/15/22   Roslyn Coombe, MD  metoprolol  tartrate (LOPRESSOR ) 50 MG tablet Take 1 tablet (50 mg total) by mouth 2 (two) times daily as needed. 07/01/22   Lenise Quince, MD  Multiple Vitamin (MULTIVITAMIN) capsule Take 1 capsule by mouth daily.    [provider]  predniSONE  (DELTASONE ) 10 MG tablet 3 tabs by mouth per day for 3 days,2tabs per day for 3 days,1tab per day for 3 days 05/12/23   Roslyn Coombe, MD  PREVIDENT  5000 SENSITIVE 1.1-5 % GEL See admin instructions. 03/10/21   [provider]  Probiotic Product (ALIGN) 4 MG CAPS Take 1 capsule by mouth daily.    [provider]  UNABLE TO FIND Med Name: Multimineral supplement    [provider]  UNABLE TO FIND Med Name: topical solution to treat brown spots on BLE    [provider]  zinc gluconate 50 MG tablet Take 50 mg by mouth daily.    [provider]  estrogen, conjugated,-medroxyprogesterone (PREMPRO) 0.625-2.5 MG per tablet Take 1 tablet by mouth daily. 02/11/11 03/09/17  Marybelle Smiling, MD    Family History Family History  Problem Relation Age of Onset   Cancer Mother        Unsure where cancer started    Asthma Sister    Colon polyps Sister    Colon cancer Neg Hx    Esophageal cancer Neg Hx    Rectal cancer Neg Hx    Stomach cancer Neg Hx     Social History Social History   Tobacco Use    Smoking status: Never   Smokeless tobacco: Never  Vaping Use   Vaping status: Never Used  Substance Use Topics   Alcohol use: Yes    Alcohol/week: 2.0 standard drinks of alcohol    Types: 1 Glasses of wine, 1 Shots of liquor per week    Comment: social    Drug use: No     Allergies   Latex, Macrobid  [nitrofurantoin  macrocrystal], Amoxicillin, and Meloxicam    Review of Systems Review of Systems  Constitutional:  Negative for chills, fatigue  and fever.  HENT:  Negative for congestion, ear pain, nosebleeds, postnasal drip, rhinorrhea, sinus pressure, sinus pain and sore throat.   Eyes:  Negative for photophobia, pain, discharge, redness, itching and visual disturbance.  Respiratory:  Negative for cough, shortness of breath and wheezing.   Gastrointestinal:  Negative for abdominal pain, diarrhea, nausea and vomiting.  Musculoskeletal:  Negative for arthralgias and myalgias.  Skin:  Positive for rash.  Neurological:  Negative for light-headedness and headaches.  Hematological:  Negative for adenopathy. Does not bruise/bleed easily.  Psychiatric/Behavioral:  Negative for confusion and sleep disturbance.      Physical Exam Triage Vital Signs ED Triage Vitals  Encounter Vitals Group     BP 06/17/23 1336 111/69     Systolic BP Percentile --      Diastolic BP Percentile --      Pulse Rate 06/17/23 1336 65     Resp 06/17/23 1336 16     Temp 06/17/23 1336 97.9 F (36.6 C)     Temp Source 06/17/23 1336 Oral     SpO2 06/17/23 1336 98 %     Weight --      Height --      Head Circumference --      Peak Flow --      Pain Score 06/17/23 1337 0     Pain Loc --      Pain Education --      Exclude from Growth Chart --    No data found.  Updated Vital Signs BP 111/69 (BP Location: Left Arm)   Pulse 65   Temp 97.9 F (36.6 C) (Oral)   Resp 16   LMP 07/10/2012   SpO2 98%   Visual Acuity Right Eye Distance:   Left Eye Distance:   Bilateral Distance:    Right Eye Near:    Left Eye Near:    Bilateral Near:     Physical Exam Vitals and nursing note reviewed.  Constitutional:      General: She is not in acute distress.    Appearance: Normal appearance. She is not ill-appearing.  HENT:     Head: Normocephalic and atraumatic.  Eyes:     General: No scleral icterus.    Extraocular Movements: Extraocular movements intact.     Conjunctiva/sclera: Conjunctivae normal.  Pulmonary:     Effort: Pulmonary effort is normal. No respiratory distress.  Musculoskeletal:        General: Normal range of motion.     Cervical back: Normal range of motion. No rigidity.  Skin:    General: Skin is warm.     Coloration: Skin is not jaundiced.     Findings: No rash.     Comments: Vesicular lesions L side check and L side neck and under chin, chin lesion appears to cross midline  Neurological:     General: No focal deficit present.     Mental Status: She is alert and oriented to person, place, and time.     Motor: No weakness.     Gait: Gait normal.  Psychiatric:        Mood and Affect: Mood normal.        Behavior: Behavior normal.      UC Treatments / Results  Labs (all labs ordered are listed, but only abnormal results are displayed) Labs Reviewed - No data to display  EKG   Radiology No results found.  Procedures Procedures (including critical care time)  Medications Ordered in UC Medications - No  data to display  Initial Impression / Assessment and Plan / UC Course  I have reviewed the triage vital signs and the nursing notes.  Pertinent labs & imaging results that were available during my care of the patient were reviewed by me and considered in my medical decision making (see chart for details).     Shingles vs contact derm, considering recent time gardening, pulling vines and itchiness, will treat for contact derm at this time Final Clinical Impressions(s) / UC Diagnoses   Final diagnoses:  Allergic contact dermatitis due to plants,  except food   Discharge Instructions      Take medication as prescribed  ED Prescriptions     Medication Sig Dispense Auth. Provider   predniSONE  (DELTASONE ) 20 MG tablet Take 3 tablets (60 mg total) by mouth daily for 3 days, THEN 2 tablets (40 mg total) daily for 3 days, THEN 1 tablet (20 mg total) daily for 3 days. 18 tablet Lavonia Powers, PA-C      PDMP not reviewed this encounter.   Lavonia Powers, PA-C 06/17/23 1507

## 2023-06-17 NOTE — Discharge Instructions (Addendum)
 Take medication as prescribed.

## 2023-06-17 NOTE — ED Triage Notes (Signed)
 Pt state rash to her face and neck on the left side for the past 2 days.

## 2023-06-28 DIAGNOSIS — L237 Allergic contact dermatitis due to plants, except food: Secondary | ICD-10-CM | POA: Diagnosis not present

## 2023-06-28 DIAGNOSIS — L2989 Other pruritus: Secondary | ICD-10-CM | POA: Diagnosis not present

## 2023-07-05 DIAGNOSIS — L237 Allergic contact dermatitis due to plants, except food: Secondary | ICD-10-CM | POA: Diagnosis not present

## 2023-07-08 DIAGNOSIS — M722 Plantar fascial fibromatosis: Secondary | ICD-10-CM | POA: Diagnosis not present

## 2023-09-02 DIAGNOSIS — R102 Pelvic and perineal pain: Secondary | ICD-10-CM | POA: Diagnosis not present

## 2023-09-02 DIAGNOSIS — N941 Unspecified dyspareunia: Secondary | ICD-10-CM | POA: Diagnosis not present

## 2023-09-09 DIAGNOSIS — L538 Other specified erythematous conditions: Secondary | ICD-10-CM | POA: Diagnosis not present

## 2023-09-09 DIAGNOSIS — Z85828 Personal history of other malignant neoplasm of skin: Secondary | ICD-10-CM | POA: Diagnosis not present

## 2023-09-09 DIAGNOSIS — D225 Melanocytic nevi of trunk: Secondary | ICD-10-CM | POA: Diagnosis not present

## 2023-09-09 DIAGNOSIS — L821 Other seborrheic keratosis: Secondary | ICD-10-CM | POA: Diagnosis not present

## 2023-09-09 DIAGNOSIS — I788 Other diseases of capillaries: Secondary | ICD-10-CM | POA: Diagnosis not present

## 2023-09-09 DIAGNOSIS — L814 Other melanin hyperpigmentation: Secondary | ICD-10-CM | POA: Diagnosis not present

## 2023-09-09 DIAGNOSIS — L820 Inflamed seborrheic keratosis: Secondary | ICD-10-CM | POA: Diagnosis not present

## 2023-09-09 DIAGNOSIS — Z08 Encounter for follow-up examination after completed treatment for malignant neoplasm: Secondary | ICD-10-CM | POA: Diagnosis not present

## 2023-09-11 DIAGNOSIS — M79675 Pain in left toe(s): Secondary | ICD-10-CM | POA: Diagnosis not present

## 2023-09-16 DIAGNOSIS — M25571 Pain in right ankle and joints of right foot: Secondary | ICD-10-CM | POA: Diagnosis not present

## 2023-09-16 DIAGNOSIS — M79672 Pain in left foot: Secondary | ICD-10-CM | POA: Diagnosis not present

## 2023-09-16 DIAGNOSIS — M722 Plantar fascial fibromatosis: Secondary | ICD-10-CM | POA: Diagnosis not present

## 2023-09-23 ENCOUNTER — Encounter: Payer: Self-pay | Admitting: Internal Medicine

## 2023-09-23 ENCOUNTER — Other Ambulatory Visit: Payer: Self-pay | Admitting: Internal Medicine

## 2023-09-23 MED ORDER — BUPROPION HCL ER (XL) 300 MG PO TB24: 300.0000 mg | ORAL_TABLET | Freq: Every day | ORAL | 1 refills | Status: AC

## 2023-09-26 NOTE — Therapy (Signed)
 OUTPATIENT PHYSICAL THERAPY LOWER EXTREMITY EVALUATION   Patient Name: Shelby Grant MRN: [id number] DOB:Jun 12, 1955, 68 y.o., female Today's Date: 09/27/2023  END OF SESSION:  PT End of Session - 09/27/23 2100     Visit Number 1    Number of Visits 13    Date for PT Re-Evaluation 11/18/23    Authorization Type [employer]    PT Start Time 1638    PT Stop Time 1723    PT Time Calculation (min) 45 min    Activity Tolerance Patient tolerated treatment well    Behavior During Therapy [facility] for tasks assessed/performed          Past Medical History:  Diagnosis Date   ALLERGIC RHINITIS 07/03/2008   Allergy    seasonal   ANXIETY 06/02/2007   Arthritis    BELL'S PALSY, LEFT 12/02/2008   Cancer (HCC) 01/16/2015   kidney   CELLULITIS, LEFT LEG 09/14/2008   CHEST PAIN 07/07/2009   Complication of anesthesia 08/28/2014   work up in recovery room with jaw locked open   CORNS AND CALLUSES 07/03/2008   Depression    FOOT PAIN 09/22/2009   History of kidney cancer    Hyperlipidemia    IRREGULAR MENSES 06/03/2008   Neoplasm    left renal   OCD (obsessive compulsive disorder)    Renal disorder    Stress fracture of ankle since Jun 29, 2014   left   Supraventricular tachycardia (HCC) 06/02/2007   Atrial tachycardia with Wenckebach periodicity   Past Surgical History:  Procedure Laterality Date   APPENDECTOMY  20016   BLEPHAROPLASTY Bilateral 10/31/2017   COLONOSCOPY  2009   HIP ARTHROPLASTY Right 11/18/2017   LAPAROSCOPIC APPENDECTOMY N/A 08/28/2014   Procedure: APPENDECTOMY LAPAROSCOPIC;  Surgeon: Lynwood Pina, MD;  Location: [facility];  Service: General;  Laterality: N/A;   ROBOTIC ASSITED PARTIAL NEPHRECTOMY Left 01/06/2015   Procedure: ROBOTIC ASSISTED PARTIAL NEPHRECTOMY;  Surgeon: Gretel Ferrara, MD;  Location: [facility];  Service: Urology;  Laterality: Left;   WISDOM TOOTH EXTRACTION     Patient Active Problem List   Diagnosis Date Noted   Rash 05/12/2023    Tinnitus of both ears 12/26/2022   Sensorineural hearing loss, bilateral 12/26/2022   Impacted cerumen of both ears 12/26/2022   Chest pain 06/03/2022   Sprain of right wrist 03/11/2022   Sprain of left ankle 01/21/2022   Acute dysfunction of left eustachian tube 09/30/2021   Left hip pain 07/09/2021   Osteoporosis 09/22/2019   Loss of transverse plantar arch 12/04/2018   Renal cell cancer (HCC) 08/11/2018   Refusal of blood transfusions as patient is Jehovah's Witness 08/11/2018   HLD (hyperlipidemia) 08/11/2018   Encounter for screening for cervical cancer 03/29/2017   Encounter for well adult exam with abnormal findings 03/29/2017   Colon cancer screening 12/04/2015   Open bite of right thumb without damage to nail 08/04/2015   Menopause syndrome 02/11/2011   Allergic rhinitis 07/03/2008   Supraventricular tachycardia (HCC) 06/02/2007   Generalized anxiety disorder 06/02/2007    PCP: Norleen Lynwood ORN, MD  REFERRING PROVIDER: Aniceto Eva Grebe, PA-C   REFERRING DIAG: M72.2 (ICD-10-CM) - Plantar fascial fibromatosis   THERAPY DIAG:  Pain in right foot  Difficulty in walking, not elsewhere classified  Rationale for Evaluation and Treatment: Rehabilitation  ONSET DATE: April  SUBJECTIVE:   SUBJECTIVE STATEMENT: Pt is not aware of an overt precipitating incident, but it may have been related to a couple of days of  standing on a ladder. Prednisone , icing, voltaren  have not seemed to help. Is currently using a SPC to decrease wt bearing on the R foot. Pt has found shoes with wide high heels have been the most comfortable shoes for her to wear. Even more so than her Hokas. Stretching has also helped some. She does not feel like it is getting better. Pt reports recently spraining the R great toe which is getting better.  PERTINENT HISTORY: See above  PAIN:  Are you having pain? Yes: NPRS scale: 2-10/10 Pain location: Medial heel, ankle, plantar foor Pain description: sharp,  throb Aggravating factors: Increased time on feet- standing and walking, driving Relieving factors: SPC  PRECAUTIONS: None  RED FLAGS: None   WEIGHT BEARING RESTRICTIONS: No  FALLS:  Has patient fallen in last 6 months? 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 Blain after the the MRI 10/10/23  OBJECTIVE:  Note: Objective measures were completed at Evaluation unless otherwise noted.  DIAGNOSTIC FINDINGS: None for the R heel  PATIENT SURVEYS:  LEFS: 7/80= 9%  COGNITION: Overall cognitive status: Within functional limits for tasks assessed     SENSATION: WFL  EDEMA:  R medial heel swelling   POSTURE: neutral arch  PALPATION: TTPto the R medial heel and plantar fascia  LOWER EXTREMITY ROM:  Active ROM Right eval Left eval  Hip flexion    Hip extension    Hip abduction    Hip adduction    Hip internal rotation    Hip external rotation    Knee flexion    Knee extension    Ankle dorsiflexion 12 12  Ankle plantarflexion    Ankle inversion    Ankle eversion     (Blank rows = not tested)  LOWER EXTREMITY MMT:  WNLs for both LEs MMT Right eval Left eval  Hip flexion    Hip extension    Hip abduction    Hip adduction    Hip internal rotation    Hip external rotation    Knee flexion    Knee extension    Ankle dorsiflexion    Ankle plantarflexion    Ankle inversion    Ankle eversion     (Blank rows = not tested)   GAIT: Distance walked: 200' Assistive device utilized: Single point cane Level of assistance: Modified independence Comments: Antalgic gait pattern                                                                                                                 TREATMENT DATE:  [facility] Adult PT Treatment:  DATE: 09/27/23 Therapeutic Exercise:  Developed, instructed in, and pt completed therex as noted in HEP  Self Care: Load management for tissue remodeling and to complete the heel raise ex every other day   PATIENT EDUCATION:  Education details: Eval findings, POC, HEP, self care  Person educated: Patient Education method: Explanation, Demonstration, Tactile cues, Verbal cues, and Handouts Education comprehension: verbalized understanding, returned demonstration, verbal cues required, and tactile cues required  HOME EXERCISE PROGRAM: Access Code: [email or web address] URL: [email or web address] Date: 09/27/2023 Prepared by: Dasie Daft  Exercises - Gastroc Stretch on Wall (Mirrored)  - 1 x daily - 7 x weekly - 1 sets - 2-3 reps - 60 hold - Long Sitting Calf Stretch with Strap  - 1 x daily - 7 x weekly - 1 sets - 2-3 reps - 60 hold - Standing Heel Raises  - 1 x daily - 7 x weekly - 2 sets - 5 reps - 10 hold  ASSESSMENT:  CLINICAL IMPRESSION: Patient is a 68 y.o. female who was seen today for physical therapy evaluation and treatment for M72.2 (ICD-10-CM) - Plantar fascial fibromatosis. Pt presents with significant R plantar heel pain which is having a marked impact on her functional mobility and QOL. Pt is currently, using a SPC to reduce wt bearing through her R heel. Pt was initiated on a HEP and after completion today reported good pain relief, and she demonstrated an improved gait. Pt will benefit from skilled PT 2w6 to address impairments to optimize function with less pain.   OBJECTIVE IMPAIRMENTS: decreased activity tolerance, difficulty walking, increased edema, and pain.   ACTIVITY LIMITATIONS: carrying, sitting, standing, stairs, and locomotion level  PARTICIPATION LIMITATIONS: meal prep, cleaning, laundry,  driving, shopping, community activity, and occupation  PERSONAL FACTORS: Past/current experiences and Time since onset of injury/illness/exacerbation are also affecting patient's functional outcome.   REHAB POTENTIAL: Good  CLINICAL DECISION MAKING: Evolving/moderate complexity  EVALUATION COMPLEXITY: Moderate   GOALS:  SHORT TERM GOALS: Target date: 10/23/23 Pt will be Ind in an initial HEP  Baseline: started Goal status: INITIAL  2.  Pt will report 25% improvement in R plantar heel pain for improved function and quality of life Baseline: 2-10/10 Goal status: INITIAL  LONG TERM GOALS: Target date: 11/18/23  Pt will be Ind in a final HEP to maintain achieved LOF  Baseline:  Goal status: INITIAL  2.  2.  Pt will report 75% improvement in R plantar heel pain for improved function and quality of life Baseline: 2-10/10 Goal status: INITIAL  3.  Pt will walk with a normalized gait pattern s an AD with increased pace and step length Baseline: c a SPC, antalgic, slow pace Goal status: INITIAL  4.  Pt will be able to initiate a walking program being able to walk 20 mins with no more than a 2 point increase in her R plantar heel pain Baseline:  Goal status: INITIAL  5.  Pt's LEFS score will improve to 50% or greater as indication of improved function  Baseline: 9% Goal status: INITIAL  PLAN:  PT FREQUENCY: 2x/week  PT DURATION: 6 weeks  PLANNED INTERVENTIONS: 97164- PT Re-evaluation, 97110-Therapeutic exercises, 97530- Therapeutic activity, 97112- Neuromuscular re-education, 97535- Self Care, 02859- Manual therapy, [id number]- Gait training, [phone]- Aquatic Therapy, [phone]- Electrical stimulation (unattended), [phone]- Ionotophoresis 4mg /ml Dexamethasone , 79439 (1-2 muscles), 20561 (3+ muscles)- Dry Needling, Patient/Family education, Balance training, Stair training, Taping, Joint mobilization, Cryotherapy, and Moist heat  PLAN FOR NEXT SESSION: Assess response to HEP; progress  therex  as indicated; use of modalities, manual therapy; and TPDN as indicated.   Brittinie Wherley MS, PT 09/27/23 9:28 PM

## 2023-09-27 ENCOUNTER — Other Ambulatory Visit: Payer: Self-pay

## 2023-09-27 ENCOUNTER — Ambulatory Visit: Attending: Student

## 2023-09-27 DIAGNOSIS — M79671 Pain in right foot: Secondary | ICD-10-CM | POA: Diagnosis not present

## 2023-09-27 DIAGNOSIS — R262 Difficulty in walking, not elsewhere classified: Secondary | ICD-10-CM | POA: Insufficient documentation

## 2023-10-05 ENCOUNTER — Other Ambulatory Visit (INDEPENDENT_AMBULATORY_CARE_PROVIDER_SITE_OTHER)

## 2023-10-05 DIAGNOSIS — E559 Vitamin D deficiency, unspecified: Secondary | ICD-10-CM | POA: Diagnosis not present

## 2023-10-05 DIAGNOSIS — E7800 Pure hypercholesterolemia, unspecified: Secondary | ICD-10-CM | POA: Diagnosis not present

## 2023-10-05 DIAGNOSIS — R739 Hyperglycemia, unspecified: Secondary | ICD-10-CM

## 2023-10-05 DIAGNOSIS — E538 Deficiency of other specified B group vitamins: Secondary | ICD-10-CM

## 2023-10-05 LAB — BASIC METABOLIC PANEL WITH GFR
BUN: 19 mg/dL (ref 6–23)
CO2: 31 meq/L (ref 19–32)
Calcium: 9 mg/dL (ref 8.4–10.5)
Chloride: 103 meq/L (ref 96–112)
Creatinine, Ser: 0.79 mg/dL (ref 0.40–1.20)
GFR: 77.49 mL/min (ref >60.00)
Glucose, Bld: 85 mg/dL (ref 70–99)
Potassium: 3.8 meq/L (ref 3.5–5.1)
Sodium: 142 meq/L (ref 135–145)

## 2023-10-05 LAB — URINALYSIS, ROUTINE W REFLEX MICROSCOPIC
Bilirubin Urine: NEGATIVE
Ketones, ur: NEGATIVE
Leukocytes,Ua: NEGATIVE
Nitrite: POSITIVE — AB
Specific Gravity, Urine: 1.02 (ref 1.000–1.030)
Total Protein, Urine: NEGATIVE
Urine Glucose: NEGATIVE
Urobilinogen, UA: 0.2 (ref 0.0–1.0)
pH: 6 (ref 5.0–8.0)

## 2023-10-05 LAB — CBC WITH DIFFERENTIAL/PLATELET
Basophils Absolute: 0 K/uL (ref 0.0–0.1)
Basophils Relative: 0.4 % (ref 0.0–3.0)
Eosinophils Absolute: 0.2 K/uL (ref 0.0–0.7)
Eosinophils Relative: 3.8 % (ref 0.0–5.0)
HCT: 40.5 % (ref 36.0–46.0)
Hemoglobin: 13.5 g/dL (ref 12.0–15.0)
Lymphocytes Relative: 30.5 % (ref 12.0–46.0)
Lymphs Abs: 1.7 K/uL (ref 0.7–4.0)
MCHC: 33.2 g/dL (ref 30.0–36.0)
MCV: 92.5 fl (ref 78.0–100.0)
Monocytes Absolute: 0.6 K/uL (ref 0.1–1.0)
Monocytes Relative: 11.8 % (ref 3.0–12.0)
Neutro Abs: 2.9 K/uL (ref 1.4–7.7)
Neutrophils Relative %: 53.5 % (ref 43.0–77.0)
Platelets: 252 K/uL (ref 150.0–400.0)
RBC: 4.38 Mil/uL (ref 3.87–5.11)
RDW: 13.8 % (ref 11.5–15.5)
WBC: 5.4 K/uL (ref 4.0–10.5)

## 2023-10-05 LAB — LIPID PANEL
Cholesterol: 243 mg/dL — ABNORMAL HIGH (ref 0–200)
HDL: 81.4 mg/dL (ref >39.00)
LDL Cholesterol: 150 mg/dL — ABNORMAL HIGH (ref 0–99)
NonHDL: 162.06
Total CHOL/HDL Ratio: 3
Triglycerides: 60 mg/dL (ref 0.0–149.0)
VLDL: 12 mg/dL (ref 0.0–40.0)

## 2023-10-05 LAB — VITAMIN D 25 HYDROXY (VIT D DEFICIENCY, FRACTURES): VITD: 40.03 ng/mL (ref 30.00–100.00)

## 2023-10-05 LAB — VITAMIN B12: Vitamin B-12: 840 pg/mL (ref 211–911)

## 2023-10-05 LAB — TSH: TSH: 1.98 u[IU]/mL (ref 0.35–5.50)

## 2023-10-05 LAB — HEPATIC FUNCTION PANEL
ALT: 17 U/L (ref 0–35)
AST: 17 U/L (ref 0–37)
Albumin: 4.1 g/dL (ref 3.5–5.2)
Alkaline Phosphatase: 37 U/L — ABNORMAL LOW (ref 39–117)
Bilirubin, Direct: 0 mg/dL (ref 0.0–0.3)
Total Bilirubin: 0.5 mg/dL (ref 0.2–1.2)
Total Protein: 6.7 g/dL (ref 6.0–8.3)

## 2023-10-05 LAB — HEMOGLOBIN A1C: Hgb A1c MFr Bld: 6.1 % (ref 4.6–6.5)

## 2023-10-10 DIAGNOSIS — M25571 Pain in right ankle and joints of right foot: Secondary | ICD-10-CM | POA: Diagnosis not present

## 2023-10-11 ENCOUNTER — Ambulatory Visit: Payer: PPO | Admitting: Internal Medicine

## 2023-10-11 ENCOUNTER — Encounter: Payer: Self-pay | Admitting: Internal Medicine

## 2023-10-11 ENCOUNTER — Ambulatory Visit (INDEPENDENT_AMBULATORY_CARE_PROVIDER_SITE_OTHER): Payer: PPO

## 2023-10-11 VITALS — Ht 66.5 in | Wt 135.0 lb

## 2023-10-11 VITALS — BP 118/72 | HR 69 | Temp 98.1°F | Ht 66.5 in | Wt 135.6 lb

## 2023-10-11 DIAGNOSIS — E7800 Pure hypercholesterolemia, unspecified: Secondary | ICD-10-CM | POA: Diagnosis not present

## 2023-10-11 DIAGNOSIS — Z0000 Encounter for general adult medical examination without abnormal findings: Secondary | ICD-10-CM

## 2023-10-11 DIAGNOSIS — E559 Vitamin D deficiency, unspecified: Secondary | ICD-10-CM

## 2023-10-11 DIAGNOSIS — R739 Hyperglycemia, unspecified: Secondary | ICD-10-CM

## 2023-10-11 DIAGNOSIS — E538 Deficiency of other specified B group vitamins: Secondary | ICD-10-CM

## 2023-10-11 MED ORDER — CLONAZEPAM 1 MG PO TABS: ORAL_TABLET | ORAL | 2 refills | Status: AC

## 2023-10-11 NOTE — Patient Instructions (Addendum)
 Please have your Shingrix (shingles) shots done at your local pharmacy.  Please also consider having the flu shot at the pharmacy as well  Please continue all other medications as before, and refills have been done if requested.  Please have the pharmacy call with any other refills you may need.  Please continue your efforts at being more active, low cholesterol diet, and weight control.  You are otherwise up to date with prevention measures today.  Please keep your appointments with your specialists as you may have planned  Your lab work was Good!  Please make an Appointment to return for your 1 year visit, or sooner if needed, with Lab testing by Appointment as well, to be done about 3-5 days before at the FIRST FLOOR Lab (so this is for TWO appointments - please see the scheduling desk as you leave)

## 2023-10-11 NOTE — Assessment & Plan Note (Signed)
 Lab Results  Component Value Date   LDLCALC 150 (H) 10/05/2023   Uncontrolled but with card CT score zero recently, pt declines statin

## 2023-10-11 NOTE — Progress Notes (Signed)
 Subjective:   Shelby Grant is a 68 y.o. who presents for a Medicare Wellness preventive visit.  As a reminder, Annual Wellness Visits don't include a physical exam, and some assessments may be limited, especially if this visit is performed virtually. We may recommend an in-person follow-up visit with your provider if needed.  Visit Complete: Virtual I connected with  Shelby Grant on 10/11/23 by a audio enabled telemedicine application and verified that I am speaking with the correct person using two identifiers.  Patient Location: Home  Provider Location: Home Office  I discussed the limitations of evaluation and management by telemedicine. The patient expressed understanding and agreed to proceed.  Vital Signs: Because this visit was a virtual/telehealth visit, some criteria may be missing or patient reported. Any vitals not documented were not able to be obtained and vitals that have been documented are patient reported.  VideoDeclined- This patient declined Librarian, academic. Therefore the visit was completed with audio only.  Persons Participating in Visit: Patient.  AWV Questionnaire: No: Patient Medicare AWV questionnaire was not completed prior to this visit.  Cardiac Risk Factors include: advanced age (>57men, >5 women);dyslipidemia;Other (see comment), Risk factor comments: Supraventricular tachycardia     Objective:    Today's Vitals   10/11/23 0902 10/11/23 0903  Weight: 135 lb (61.2 kg)   Height: 5' 6.5 (1.689 m)   PainSc:  8    Body mass index is 21.46 kg/m.     10/11/2023    9:11 AM 09/27/2023    5:01 PM 10/05/2022    8:27 AM 05/26/2022    1:21 PM 10/15/2021    7:43 PM 01/25/2015   12:14 AM 01/06/2015    7:00 PM  Advanced Directives  Does Patient Have a Medical Advance Directive? Yes Yes Yes No No Yes  Yes   Type of Estate agent of [place];Living will Healthcare Power of [place];Living will Healthcare  Power of [place];Living will      Does patient want to make changes to medical advance directive? No - Patient declined No - Patient declined No - Patient declined      Copy of Healthcare Power of Attorney in Chart? Yes - validated most recent copy scanned in chart (See row information) No - copy requested Yes - validated most recent copy scanned in chart (See row information)   Yes    Would patient like information on creating a medical advance directive?    No - Patient declined No - Patient declined       Data saved with a previous flowsheet row definition    Current Medications (verified) Outpatient Encounter Medications as of 10/11/2023  Medication Sig   Acetaminophen  (TYLENOL  PO) Take 1,000 mg by mouth daily as needed.   alendronate  (FOSAMAX ) 70 MG tablet TAKE 1 TABLET(70 MG) BY MOUTH EVERY 7 DAYS WITH A FULL GLASS OF WATER  AND ON AN EMPTY STOMACH   ALFALFA PO Take by mouth.   b complex vitamins tablet Take 1 tablet by mouth daily.   buPROPion  (WELLBUTRIN  XL) 300 MG 24 hr tablet Take 1 tablet (300 mg total) by mouth daily.   Calcium -Magnesium -Vitamin D  (CALCIUM  MAGNESIUM  PO) Take 4 tablets by mouth daily.    clonazePAM  (KLONOPIN ) 1 MG tablet TAKE 1 TABLET BY MOUTH DAILY AS NEEDED   cyanocobalamin  100 MCG tablet Take 100 mcg by mouth daily.   diclofenac  Sodium (VOLTAREN ) 1 % GEL Apply 2 g topically 3 (three) times daily.   fluocinonide (  LIDEX) 0.05 % external solution APPLY TO THE SCALP TWICE DAILY FOR 2 WEEKS THEN DECREASE TO EVERY OTHER DAY   ibuprofen (ADVIL) 200 MG tablet Take 200 mg by mouth every 6 (six) hours as needed.   metoprolol  succinate (TOPROL -XL) 25 MG 24 hr tablet TAKE 1/2 TABLET(12.5 MG) BY MOUTH TWICE DAILY   metoprolol  tartrate (LOPRESSOR ) 50 MG tablet Take 1 tablet (50 mg total) by mouth 2 (two) times daily as needed.   Multiple Vitamin (MULTIVITAMIN) capsule Take 1 capsule by mouth daily.   predniSONE  (DELTASONE ) 10 MG tablet 3 tabs by mouth per day for 3 days,2tabs  per day for 3 days,1tab per day for 3 days   PREVIDENT  5000 SENSITIVE 1.1-5 % GEL See admin instructions.   Probiotic Product (ALIGN) 4 MG CAPS Take 1 capsule by mouth daily.   UNABLE TO FIND Med Name: Multimineral supplement   UNABLE TO FIND Med Name: topical solution to treat brown spots on BLE   zinc gluconate 50 MG tablet Take 50 mg by mouth daily.   [DISCONTINUED] estrogen, conjugated,-medroxyprogesterone (PREMPRO) 0.625-2.5 MG per tablet Take 1 tablet by mouth daily.   No facility-administered encounter medications on file as of 10/11/2023.    Allergies (verified) Latex, Macrobid  [nitrofurantoin  macrocrystal], Amoxicillin, and Meloxicam    History: Past Medical History:  Diagnosis Date   ALLERGIC RHINITIS 07/03/2008   Allergy    seasonal   ANXIETY 06/02/2007   Arthritis    BELL'S PALSY, LEFT 12/02/2008   Cancer (HCC) 01/16/2015   kidney   CELLULITIS, LEFT LEG 09/14/2008   CHEST PAIN 07/07/2009   Complication of anesthesia 08/28/2014   work up in recovery room with jaw locked open   CORNS AND CALLUSES 07/03/2008   Depression    FOOT PAIN 09/22/2009   History of kidney cancer    Hyperlipidemia    IRREGULAR MENSES 06/03/2008   Neoplasm    left renal   OCD (obsessive compulsive disorder)    Renal disorder    Stress fracture of ankle since Jun 29, 2014   left   Supraventricular tachycardia (HCC) 06/02/2007   Atrial tachycardia with Wenckebach periodicity   Past Surgical History:  Procedure Laterality Date   APPENDECTOMY  20016   BLEPHAROPLASTY Bilateral 10/31/2017   COLONOSCOPY  2009   HIP ARTHROPLASTY Right 11/18/2017   LAPAROSCOPIC APPENDECTOMY N/A 08/28/2014   Procedure: APPENDECTOMY LAPAROSCOPIC;  Surgeon: Lynwood Pina, MD;  Location: [facility];  Service: General;  Laterality: N/A;   ROBOTIC ASSITED PARTIAL NEPHRECTOMY Left 01/06/2015   Procedure: ROBOTIC ASSISTED PARTIAL NEPHRECTOMY;  Surgeon: Gretel Ferrara, MD;  Location: [facility];  Service: Urology;  Laterality:  Left;   WISDOM TOOTH EXTRACTION     Family History  Problem Relation Age of Onset   Cancer Mother        Unsure where cancer started    Asthma Sister    Colon polyps Sister    Colon cancer Neg Hx    Esophageal cancer Neg Hx    Rectal cancer Neg Hx    Stomach cancer Neg Hx    Social History   Socioeconomic History   Marital status: Married    Spouse name: Lynwood   Number of children: 1   Years of education: Not on file   Highest education level: Not on file  Occupational History   Occupation: Hospital doctor: [employer]   Occupation: Part time  Tobacco Use   Smoking status: Never   Smokeless tobacco: Never  Vaping Use   Vaping status: Never Used  Substance and Sexual Activity   Alcohol use: Yes    Alcohol/week: 2.0 standard drinks of alcohol    Types: 1 Glasses of wine, 1 Shots of liquor per week    Comment: social    Drug use: No   Sexual activity: Not on file    Comment: married  Other Topics Concern   Not on file  Social History Narrative   No caffeine    Step- son.      Lives with husband/2025   Social Drivers of Health   Financial Resource Strain: Low Risk  (10/11/2023)   Overall Financial Resource Strain (CARDIA)    Difficulty of Paying Living Expenses: Not hard at all  Food Insecurity: No Food Insecurity (10/11/2023)   Hunger Vital Sign    Worried About Running Out of Food in the Last Year: Never true    Ran Out of Food in the Last Year: Never true  Transportation Needs: No Transportation Needs (10/11/2023)   PRAPARE - Administrator, Civil Service (Medical): No    Lack of Transportation (Non-Medical): No  Physical Activity: Inactive (10/11/2023)   Exercise Vital Sign    Days of Exercise per Week: 0 days    Minutes of Exercise per Session: 0 min  Stress: No Stress Concern Present (10/11/2023)   [employer] of Occupational Health - Occupational Stress Questionnaire    Feeling of Stress: Only a little  Social  Connections: Socially Integrated (10/11/2023)   Social Connection and Isolation Panel    Frequency of Communication with Friends and Family: More than three times a week    Frequency of Social Gatherings with Friends and Family: Once a week    Attends Religious Services: More than 4 times per year    Active Member of [employer] or Organizations: Yes    Attends Engineer, structural: More than 4 times per year    Marital Status: Married    Tobacco Counseling Counseling given: Not Answered    Clinical Intake:  Pre-visit preparation completed: Yes  Pain : 0-10 Pain Score: 8  Pain Location: Heel Pain Orientation: Right Pain Descriptors / Indicators: Aching, Discomfort Pain Onset: More than a month ago Pain Frequency: Constant Effect of Pain on Daily Activities: walking or standing     BMI - recorded: 21.46 Nutritional Status: BMI of 19-24  Normal Nutritional Risks: None Diabetes: No  Lab Results  Component Value Date   HGBA1C 6.1 10/05/2023   HGBA1C 5.7 09/30/2022   HGBA1C 5.7 09/25/2020     How often do you need to have someone help you when you read instructions, pamphlets, or other written materials from your doctor or pharmacy?: 1 - Never     Information entered by :: Athalene Kolle, RMA   Activities of Daily Living     10/11/2023    9:04 AM  In your present state of health, do you have any difficulty performing the following activities:  Hearing? 1  Comment Supraventricular tachycardia  Vision? 0  Difficulty concentrating or making decisions? 0  Walking or climbing stairs? 0  Dressing or bathing? 0  Doing errands, shopping? 0  Preparing Food and eating ? N  Using the Toilet? N  In the past six months, have you accidently leaked urine? Y  Comment sneeze  Do you have problems with loss of bowel control? N  Managing your Medications? N  Managing your Finances? N  Housekeeping or managing your Housekeeping?  N    Patient Care Team: Norleen Lynwood ORN, MD  as PCP - General (Internal Medicine) [facility] Patrecia Cleotilde, MD) (Ophthalmology)  I have updated your Care Teams any recent Medical Services you may have received from other providers in the past year.     Assessment:   This is a routine wellness examination for Shelby.  Hearing/Vision screen Hearing Screening - Comments:: Wears hearing aides sometimes Vision Screening - Comments:: Wears eyeglasses/ [facility]   Goals Addressed               This Visit's Progress     Patient Stated (pt-stated)        Continue with walking exercises/2025       Depression Screen     10/11/2023    9:14 AM 05/12/2023    2:49 PM 10/05/2022    9:13 AM 10/05/2022    8:31 AM 06/01/2022    2:10 PM 03/30/2022    3:31 PM 09/29/2021    8:13 AM  PHQ 2/9 Scores  PHQ - 2 Score 0 0 0 0 0 0 0  PHQ- 9 Score 0 0 0 0  0     Fall Risk     10/11/2023    9:12 AM 05/12/2023    2:48 PM 10/05/2022    9:13 AM 10/05/2022    8:27 AM 06/01/2022    2:10 PM  Fall Risk   Falls in the past year? 0 0 1 1 0  Number falls in past yr: 0 0 0 0 0  Injury with Fall? 0 0 1 1 0  Comment    sprained ankle   Risk for fall due to :   History of fall(s)  No Fall Risks  Follow up Falls evaluation completed;Falls prevention discussed Falls evaluation completed Falls evaluation completed Falls evaluation completed;Falls prevention discussed Falls evaluation completed    MEDICARE RISK AT HOME:  Medicare Risk at Home Any stairs in or around the home?: Yes If so, are there any without handrails?: No Home free of loose throw rugs in walkways, pet beds, electrical cords, etc?: Yes Adequate lighting in your home to reduce risk of falls?: Yes Life alert?: No Use of a cane, walker or w/c?: Yes (cane for now/since heel pain) Grab bars in the bathroom?: No Shower chair or bench in shower?: Yes Elevated toilet seat or a handicapped toilet?: Yes  TIMED UP AND GO:  Was the test performed?  No  Cognitive Function:  Declined/Normal: No cognitive concerns noted by patient or family. Patient alert, oriented, able to answer questions appropriately and recall recent events. No signs of memory loss or confusion.        10/05/2022    8:48 AM  6CIT Screen  What Year? 0 points  What month? 0 points  What time? 0 points  Count back from 20 0 points  Months in reverse 0 points  Repeat phrase 0 points  Total Score 0 points    Immunizations Immunization History  Administered Date(s) Administered   INFLUENZA, HIGH DOSE SEASONAL PF 09/11/2021   Influenza Whole 11/07/2007, 12/02/2008, 10/29/2009, 11/10/2011   Influenza, Seasonal, Injecte, Preservative Fre 11/25/2016   Influenza,inj,Quad PF,6+ Mos 10/10/2012, 10/15/2014, 11/04/2017, 12/15/2018   Influenza,inj,quad, With Preservative 12/02/2016   Influenza-Unspecified 11/02/2013, 11/02/2014, 09/13/2015, 10/03/2015   PFIZER(Purple Top)SARS-COV-2 Vaccination 04/27/2019, 05/22/2019   PNEUMOCOCCAL CONJUGATE-20 09/29/2021   Td 11/03/1998, 06/03/2008   Tdap 08/11/2018   Zoster, Live 09/13/2015    Screening Tests Health Maintenance  Topic  Date Due   Zoster Vaccines- Shingrix (1 of 2) 07/04/1975   COVID-19 Vaccine (3 - Pfizer risk series) 06/19/2019   Influenza Vaccine  09/02/2023   Medicare Annual Wellness (AWV)  10/05/2023   Colonoscopy  07/05/2024   MAMMOGRAM  10/11/2024   DTaP/Tdap/Td (4 - Td or Tdap) 08/10/2028   Pneumococcal Vaccine: 50+ Years  Completed   DEXA SCAN  Completed   Hepatitis C Screening  Completed   HPV VACCINES  Aged Out   Meningococcal B Vaccine  Aged Out    Health Maintenance Items Addressed: See Nurse Notes at the end of this note  Additional Screening:  Vision Screening: Recommended annual ophthalmology exams for early detection of glaucoma and other disorders of the eye. Is the patient up to date with their annual eye exam?  No  Who is the provider or what is the name of the office in which the patient attends annual eye  exams? Dr. [facility]  Dental Screening: Recommended annual dental exams for proper oral hygiene  Community Resource Referral / Chronic Care Management: CRR required this visit?  No   CCM required this visit?  No   Plan:    I have personally reviewed and noted the following in the patient's chart:   Medical and social history Use of alcohol, tobacco or illicit drugs  Current medications and supplements including opioid prescriptions. Patient is not currently taking opioid prescriptions. Functional ability and status Nutritional status Physical activity Advanced directives List of other physicians Hospitalizations, surgeries, and ER visits in previous 12 months Vitals Screenings to include cognitive, depression, and falls Referrals and appointments  In addition, I have reviewed and discussed with patient certain preventive protocols, quality metrics, and best practice recommendations. A written personalized care plan for preventive services as well as general preventive health recommendations were provided to patient.   Ngan Qualls L Elexus Barman, CMA   10/11/2023   After Visit Summary: (MyChart) Due to this being a telephonic visit, the after visit summary with patients personalized plan was offered to patient via MyChart   Notes: Patient is due for a Flu vaccine and would like to get it done during her office visit today with Dr. Norleen.  She is also due for a Shingrix vaccine.  Patient stated that she had an MRI recently for heel pain x5 months.  She had no other concerns to address today.

## 2023-10-11 NOTE — Assessment & Plan Note (Signed)
Age and sex appropriate education and counseling updated with regular exercise and diet Referrals for preventative services - none needed Immunizations addressed - for flu shot and shingrix at pharmacy Smoking counseling  - none needed Evidence for depression or other mood disorder - none significant Most recent labs reviewed. I have personally reviewed and have noted: 1) the patient's medical and social history 2) The patient's current medications and supplements 3) The patient's height, weight, and BMI have been recorded in the chart

## 2023-10-11 NOTE — Progress Notes (Signed)
 Patient ID: Shelby Grant, female   DOB: [birth year], 68 y.o.   MRN: [id number]         Chief Complaint:: wellness exam       HPI:  Shelby Grant is a 68 y.o. female here for wellness exam; declines flu shot, for shingrix at pharmacy, o/w up to date                        Also Pt denies chest pain, increased sob or doe, wheezing, orthopnea, PND, increased LE swelling, palpitations, dizziness or syncope.   Pt denies polydipsia, polyuria, or new focal neuro s/s.    Pt denies fever, wt loss, night sweats, loss of appetite, or other constitutional symptoms  Does have right plantar fasciitis and has MRI per ortho pending.  Denies worsening depressive symptoms, suicidal ideation, or panic;   Wt Readings from Last 3 Encounters:  10/11/23 135 lb 9.6 oz (61.5 kg)  10/11/23 135 lb (61.2 kg)  05/12/23 135 lb 12.8 oz (61.6 kg)   BP Readings from Last 3 Encounters:  10/11/23 118/72  06/17/23 111/69  05/12/23 100/62   Immunization History  Administered Date(s) Administered   INFLUENZA, HIGH DOSE SEASONAL PF 09/11/2021   Influenza Whole 11/07/2007, 12/02/2008, 10/29/2009, 11/10/2011   Influenza, Seasonal, Injecte, Preservative Fre 11/25/2016   Influenza,inj,Quad PF,6+ Mos 10/10/2012, 10/15/2014, 11/04/2017, 12/15/2018   Influenza,inj,quad, With Preservative 12/02/2016   Influenza-Unspecified 11/02/2013, 11/02/2014, 09/13/2015, 10/03/2015   PFIZER(Purple Top)SARS-COV-2 Vaccination 04/27/2019, 05/22/2019   PNEUMOCOCCAL CONJUGATE-20 09/29/2021   Td 11/03/1998, 06/03/2008   Tdap 08/11/2018   Zoster, Live 09/13/2015   Health Maintenance Due  Topic Date Due   Zoster Vaccines- Shingrix (1 of 2) 07/04/1975   COVID-19 Vaccine (3 - Pfizer risk series) 06/19/2019   Influenza Vaccine  09/02/2023      Past Medical History:  Diagnosis Date   ALLERGIC RHINITIS 07/03/2008   Allergy    seasonal   ANXIETY 06/02/2007   Arthritis    BELL'S PALSY, LEFT 12/02/2008   Cancer (HCC) 01/16/2015   kidney    CELLULITIS, LEFT LEG 09/14/2008   CHEST PAIN 07/07/2009   Complication of anesthesia 08/28/2014   work up in recovery room with jaw locked open   CORNS AND CALLUSES 07/03/2008   Depression    FOOT PAIN 09/22/2009   History of kidney cancer    Hyperlipidemia    IRREGULAR MENSES 06/03/2008   Neoplasm    left renal   OCD (obsessive compulsive disorder)    Renal disorder    Stress fracture of ankle since Jun 29, 2014   left   Supraventricular tachycardia (HCC) 06/02/2007   Atrial tachycardia with Wenckebach periodicity   Past Surgical History:  Procedure Laterality Date   APPENDECTOMY  20016   BLEPHAROPLASTY Bilateral 10/31/2017   COLONOSCOPY  2009   HIP ARTHROPLASTY Right 11/18/2017   LAPAROSCOPIC APPENDECTOMY N/A 08/28/2014   Procedure: APPENDECTOMY LAPAROSCOPIC;  Surgeon: Lynwood Pina, MD;  Location: [facility];  Service: General;  Laterality: N/A;   ROBOTIC ASSITED PARTIAL NEPHRECTOMY Left 01/06/2015   Procedure: ROBOTIC ASSISTED PARTIAL NEPHRECTOMY;  Surgeon: Gretel Ferrara, MD;  Location: [facility];  Service: Urology;  Laterality: Left;   WISDOM TOOTH EXTRACTION      reports that she has never smoked. She has never used smokeless tobacco. She reports current alcohol use of about 2.0 standard drinks of alcohol per week. She reports that she does not use drugs. family history includes Asthma in her sister; Cancer  in her mother; Colon polyps in her sister. Allergies  Allergen Reactions   Latex     REACTION: rash   Macrobid  [Nitrofurantoin  Macrocrystal] Swelling and Other (See Comments)    Lips and inside of mouth swelling   Amoxicillin Rash    Has patient had a PCN reaction causing immediate rash, facial/tongue/throat swelling, SOB or lightheadedness with hypotension: unkown Has patient had a PCN reaction causing severe rash involving mucus membranes or skin necrosis: no Has patient had a PCN reaction that required hospitalization No Has patient had a PCN reaction occurring within the  last 10 years: unknown If all of the above answers are NO, then may proceed with Cephalosporin use.   Meloxicam  Rash   Current Outpatient Medications on File Prior to Visit  Medication Sig Dispense Refill   Acetaminophen  (TYLENOL  PO) Take 1,000 mg by mouth daily as needed.     alendronate  (FOSAMAX ) 70 MG tablet TAKE 1 TABLET(70 MG) BY MOUTH EVERY 7 DAYS WITH A FULL GLASS OF WATER  AND ON AN EMPTY STOMACH 12 tablet 3   ALFALFA PO Take by mouth.     b complex vitamins tablet Take 1 tablet by mouth daily.     buPROPion  (WELLBUTRIN  XL) 300 MG 24 hr tablet Take 1 tablet (300 mg total) by mouth daily. 90 tablet 1   Calcium -Magnesium -Vitamin D  (CALCIUM  MAGNESIUM  PO) Take 4 tablets by mouth daily.      clonazePAM  (KLONOPIN ) 1 MG tablet TAKE 1 TABLET BY MOUTH DAILY AS NEEDED 30 tablet 2   cyanocobalamin  100 MCG tablet Take 100 mcg by mouth daily.     diclofenac  Sodium (VOLTAREN ) 1 % GEL Apply 2 g topically 3 (three) times daily. 100 g 0   fluocinonide (LIDEX) 0.05 % external solution APPLY TO THE SCALP TWICE DAILY FOR 2 WEEKS THEN DECREASE TO EVERY OTHER DAY     ibuprofen (ADVIL) 200 MG tablet Take 200 mg by mouth every 6 (six) hours as needed.     metoprolol  succinate (TOPROL -XL) 25 MG 24 hr tablet TAKE 1/2 TABLET(12.5 MG) BY MOUTH TWICE DAILY 90 tablet 3   metoprolol  tartrate (LOPRESSOR ) 50 MG tablet Take 1 tablet (50 mg total) by mouth 2 (two) times daily as needed. 180 tablet 3   Multiple Vitamin (MULTIVITAMIN) capsule Take 1 capsule by mouth daily.     predniSONE  (DELTASONE ) 10 MG tablet 3 tabs by mouth per day for 3 days,2tabs per day for 3 days,1tab per day for 3 days 18 tablet 0   PREVIDENT  5000 SENSITIVE 1.1-5 % GEL See admin instructions.     Probiotic Product (ALIGN) 4 MG CAPS Take 1 capsule by mouth daily.     UNABLE TO FIND Med Name: Multimineral supplement     UNABLE TO FIND Med Name: topical solution to treat brown spots on BLE     zinc gluconate 50 MG tablet Take 50 mg by mouth  daily.     [DISCONTINUED] estrogen, conjugated,-medroxyprogesterone (PREMPRO) 0.625-2.5 MG per tablet Take 1 tablet by mouth daily. 28 tablet 11   No current facility-administered medications on file prior to visit.        ROS:  All others reviewed and negative.  Objective        PE:  BP 118/72   Pulse 69   Temp 98.1 F (36.7 C)   Ht 5' 6.5 (1.689 m)   Wt 135 lb 9.6 oz (61.5 kg)   LMP 07/10/2012   SpO2 96%   BMI 21.56 kg/m  Constitutional: Pt appears in NAD               HENT: Head: NCAT.                Right Ear: External ear normal.                 Left Ear: External ear normal.                Eyes: . Pupils are equal, round, and reactive to light. Conjunctivae and EOM are normal               Nose: without d/c or deformity               Neck: Neck supple. Gross normal ROM               Cardiovascular: Normal rate and regular rhythm.                 Pulmonary/Chest: Effort normal and breath sounds without rales or wheezing.                Abd:  Soft, NT, ND, + BS, no organomegaly               Neurological: Pt is alert. At baseline orientation, motor grossly intact               Skin: Skin is warm. No rashes, no other new lesions, LE edema - none               Psychiatric: Pt behavior is normal without agitation   Micro: none  Cardiac tracings I have personally interpreted today:  none  Pertinent Radiological findings (summarize): none   Lab Results  Component Value Date   WBC 5.4 10/05/2023   HGB 13.5 10/05/2023   HCT 40.5 10/05/2023   PLT 252.0 10/05/2023   GLUCOSE 85 10/05/2023   CHOL 243 (H) 10/05/2023   TRIG 60.0 10/05/2023   HDL 81.40 10/05/2023   LDLDIRECT 137.5 10/06/2012   LDLCALC 150 (H) 10/05/2023   ALT 17 10/05/2023   AST 17 10/05/2023   NA 142 10/05/2023   K 3.8 10/05/2023   CL 103 10/05/2023   CREATININE 0.79 10/05/2023   BUN 19 10/05/2023   CO2 31 10/05/2023   TSH 1.98 10/05/2023   HGBA1C 6.1 10/05/2023   Assessment/Plan:   Shelby Grant is a 68 y.o. White or Caucasian [1] female with  has a past medical history of ALLERGIC RHINITIS (07/03/2008), Allergy, ANXIETY (06/02/2007), Arthritis, BELL'S PALSY, LEFT (12/02/2008), Cancer (HCC) (01/16/2015), CELLULITIS, LEFT LEG (09/14/2008), CHEST PAIN (07/07/2009), Complication of anesthesia (08/28/2014), CORNS AND CALLUSES (07/03/2008), Depression, FOOT PAIN (09/22/2009), History of kidney cancer, Hyperlipidemia, IRREGULAR MENSES (06/03/2008), Neoplasm, OCD (obsessive compulsive disorder), Renal disorder, Stress fracture of ankle (since Jun 29, 2014), and Supraventricular tachycardia (HCC) (06/02/2007).  No problem-specific Assessment & Plan notes found for this encounter.  Followup: No follow-ups on file.  Lynwood Rush, MD 10/11/2023 10:24 AM [facility]

## 2023-10-11 NOTE — Patient Instructions (Signed)
 Shelby Grant,  Thank you for taking the time for your Medicare Wellness Visit. I appreciate your continued commitment to your health goals. Please review the care plan we discussed, and feel free to reach out if I can assist you further.  Medicare recommends these wellness visits once per year to help you and your care team stay ahead of potential health issues. These visits are designed to focus on prevention, allowing your provider to concentrate on managing your acute and chronic conditions during your regular appointments.  Please note that Annual Wellness Visits do not include a physical exam. Some assessments may be limited, especially if the visit was conducted virtually. If needed, we may recommend a separate in-person follow-up with your provider.  Ongoing Care Seeing your primary care provider every 3 to 6 months helps us  monitor your health and provide consistent, personalized care. Next office visit on 10/11/2023.  You are due for a Flu vaccine and can get that done during your next office visit.  You are also due for a Shingles vaccine as well and can get that done at your pharmacy.  Referrals If a referral was made during today's visit and you haven't received any updates within two weeks, please contact the referred provider directly to check on the status.  Recommended Screenings:  Health Maintenance  Topic Date Due   Zoster (Shingles) Vaccine (1 of 2) 07/04/1975   COVID-19 Vaccine (3 - Pfizer risk series) 06/19/2019   Flu Shot  09/02/2023   Medicare Annual Wellness Visit  10/05/2023   Colon Cancer Screening  07/05/2024   Mammogram  10/11/2024   DTaP/Tdap/Td vaccine (4 - Td or Tdap) 08/10/2028   Pneumococcal Vaccine for age over 11  Completed   DEXA scan (bone density measurement)  Completed   Hepatitis C Screening  Completed   HPV Vaccine  Aged Out   Meningitis B Vaccine  Aged Out       09/27/2023    5:01 PM  Advanced Directives  Does Patient Have a Medical Advance  Directive? Yes  Type of Estate agent of [place];Living will  Does patient want to make changes to medical advance directive? No - Patient declined  Copy of Healthcare Power of Attorney in Chart? No - copy requested   Advance Care Planning is important because it: Ensures you receive medical care that aligns with your values, goals, and preferences. Provides guidance to your family and loved ones, reducing the emotional burden of decision-making during critical moments.  Vision: Annual vision screenings are recommended for early detection of glaucoma, cataracts, and diabetic retinopathy. These exams can also reveal signs of chronic conditions such as diabetes and high blood pressure.  Dental: Annual dental screenings help detect early signs of oral cancer, gum disease, and other conditions linked to overall health, including heart disease and diabetes.  Please see the attached documents for additional preventive care recommendations.

## 2023-10-12 ENCOUNTER — Ambulatory Visit: Attending: Internal Medicine

## 2023-10-12 DIAGNOSIS — R262 Difficulty in walking, not elsewhere classified: Secondary | ICD-10-CM | POA: Insufficient documentation

## 2023-10-12 DIAGNOSIS — M79671 Pain in right foot: Secondary | ICD-10-CM | POA: Diagnosis not present

## 2023-10-12 NOTE — Therapy (Signed)
 OUTPATIENT PHYSICAL THERAPY TREATMENT NOTE   Patient Name: Shelby Grant MRN: [id number] DOB:10-10-1955, 68 y.o., female Today's Date: 10/12/2023  END OF SESSION:  PT End of Session - 10/12/23 1740     Visit Number 2    Number of Visits 13    Date for PT Re-Evaluation 11/18/23    Authorization Type [employer] P[employer]    PT Start Time 1743    PT Stop Time 1823    PT Time Calculation (min) 40 min    Activity Tolerance Patient tolerated treatment well    Behavior During Therapy [facility] for tasks assessed/performed         Past Medical History:  Diagnosis Date   ALLERGIC RHINITIS 07/03/2008   Allergy    seasonal   ANXIETY 06/02/2007   Arthritis    BELL'S PALSY, LEFT 12/02/2008   Cancer (HCC) 01/16/2015   kidney   CELLULITIS, LEFT LEG 09/14/2008   CHEST PAIN 07/07/2009   Complication of anesthesia 08/28/2014   work up in recovery room with jaw locked open   CORNS AND CALLUSES 07/03/2008   Depression    FOOT PAIN 09/22/2009   History of kidney cancer    Hyperlipidemia    IRREGULAR MENSES 06/03/2008   Neoplasm    left renal   OCD (obsessive compulsive disorder)    Renal disorder    Stress fracture of ankle since Jun 29, 2014   left   Supraventricular tachycardia (HCC) 06/02/2007   Atrial tachycardia with Wenckebach periodicity   Past Surgical History:  Procedure Laterality Date   APPENDECTOMY  20016   BLEPHAROPLASTY Bilateral 10/31/2017   COLONOSCOPY  2009   HIP ARTHROPLASTY Right 11/18/2017   LAPAROSCOPIC APPENDECTOMY N/A 08/28/2014   Procedure: APPENDECTOMY LAPAROSCOPIC;  Surgeon: Lynwood Pina, MD;  Location: [facility] OR;  Service: General;  Laterality: N/A;   ROBOTIC ASSITED PARTIAL NEPHRECTOMY Left 01/06/2015   Procedure: ROBOTIC ASSISTED PARTIAL NEPHRECTOMY;  Surgeon: Gretel Ferrara, MD;  Location: [facility] ORS;  Service: Urology;  Laterality: Left;   WISDOM TOOTH EXTRACTION     Patient Active Problem List   Diagnosis Date Noted   Rash 05/12/2023   Tinnitus of  both ears 12/26/2022   Sensorineural hearing loss, bilateral 12/26/2022   Impacted cerumen of both ears 12/26/2022   Chest pain 06/03/2022   Sprain of right wrist 03/11/2022   Sprain of left ankle 01/21/2022   Acute dysfunction of left eustachian tube 09/30/2021   Left hip pain 07/09/2021   Osteoporosis 09/22/2019   Loss of transverse plantar arch 12/04/2018   Renal cell cancer (HCC) 08/11/2018   Refusal of blood transfusions as patient is Jehovah's Witness 08/11/2018   HLD (hyperlipidemia) 08/11/2018   Encounter for screening for cervical cancer 03/29/2017   Preventative health care 03/29/2017   Colon cancer screening 12/04/2015   Open bite of right thumb without damage to nail 08/04/2015   Menopause syndrome 02/11/2011   Allergic rhinitis 07/03/2008   Supraventricular tachycardia (HCC) 06/02/2007   Generalized anxiety disorder 06/02/2007    PCP: Norleen Lynwood ORN, MD  REFERRING PROVIDER: Aniceto Eva Grebe, PA-C   REFERRING DIAG: M72.2 (ICD-10-CM) - Plantar fascial fibromatosis   THERAPY DIAG:  Pain in right foot  Difficulty in walking, not elsewhere classified  Rationale for Evaluation and Treatment: Rehabilitation  ONSET DATE: April  SUBJECTIVE:   SUBJECTIVE STATEMENT: Patient reports that her pain is at a 7/10 today which is good day for her, she states this is because she is sitting down more.  EVAL:  Pt is not aware of an overt precipitating incident, but it may have been related to a couple of days of standing on a ladder. Prednisone , icing, voltaren  have not seemed to help. Is currently using a SPC to decrease wt bearing on the R foot. Pt has found shoes with wide high heels have been the most comfortable shoes for her to wear. Even more so than her Hokas. Stretching has also helped some. She does not feel like it is getting better. Pt reports recently spraining the R great toe which is getting better.  PERTINENT HISTORY: See above  PAIN:  Are you having pain?  Yes: NPRS scale: 2-10/10 Pain location: Medial heel, ankle, plantar foor Pain description: sharp, throb Aggravating factors: Increased time on feet- standing and walking, driving Relieving factors: SPC  PRECAUTIONS: None  RED FLAGS: None   WEIGHT BEARING RESTRICTIONS: No  FALLS:  Has patient fallen in last 6 months? 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 Blain after the the MRI 10/10/23  OBJECTIVE:  Note: Objective measures were completed at Evaluation unless otherwise noted.  DIAGNOSTIC FINDINGS: None for the R heel  PATIENT SURVEYS:  LEFS: 7/80= 9%  COGNITION: Overall cognitive status: Within functional limits for tasks assessed     SENSATION: WFL  EDEMA:  R medial heel swelling   POSTURE: neutral arch  PALPATION: TTPto the R medial heel and plantar fascia  LOWER EXTREMITY ROM:  Active ROM Right eval Left eval  Hip flexion    Hip extension    Hip abduction    Hip adduction    Hip internal rotation    Hip external rotation    Knee flexion    Knee extension    Ankle dorsiflexion 12 12  Ankle plantarflexion    Ankle inversion    Ankle eversion     (Blank rows = not tested)  LOWER EXTREMITY MMT:  WNLs for both LEs MMT Right eval Left eval  Hip flexion    Hip extension    Hip abduction    Hip adduction    Hip internal rotation    Hip external rotation    Knee flexion    Knee extension    Ankle dorsiflexion    Ankle plantarflexion    Ankle inversion    Ankle eversion     (Blank rows = not tested)   GAIT: Distance walked: 200' Assistive device utilized: Single point  cane Level of assistance: Modified independence Comments: Antalgic gait pattern                                                                                                                TREATMENT DATE:  [place]  Adult PT Treatment:                                                DATE: 10/12/23 Therapeutic Exercise: Nustep level 4 x 5 mins while gathering subjective and planning session with patient Slant board gastroc stretch 2x1' RLE only Slant board soleus stretch 2x1' RLE Therapeutic Activity: SPC training, moving cane to LUE - cues for sequencing, pacing, form Stair training with cane and no handrails, with handrails, using short stairs  Manual Therapy: IASTM to R calf, achilles, heel, and plantar surface of foot (next session)   [facility] Adult PT Treatment:                                      DATE: 09/27/23 Therapeutic Exercise:  Developed, instructed in, and pt completed therex as noted in HEP  Self Care: Load management for tissue remodeling and to complete the heel raise ex every other day   PATIENT EDUCATION:  Education details: Eval findings, POC, HEP, self care  Person educated: Patient Education method: Explanation, Demonstration, Tactile cues, Verbal cues, and Handouts Education comprehension: verbalized understanding, returned demonstration, verbal cues required, and tactile cues required  HOME EXERCISE PROGRAM: Access Code: [email or web address] URL: [email or web address] Date: 09/27/2023 Prepared by: Dasie Daft  Exercises - Gastroc Stretch on Wall (Mirrored)  - 1 x daily - 7 x weekly - 1 sets - 2-3 reps - 60 hold - Long Sitting Calf Stretch with Strap  - 1 x daily - 7 x weekly - 1 sets - 2-3 reps - 60 hold - Standing Heel Raises  - 1 x daily - 7 x weekly - 2 sets - 5 reps - 10 hold  ASSESSMENT:  CLINICAL IMPRESSION: Patient presents to first follow up PT session reporting continued pain, states it is a 7/10 today which is an improvement. She has been compliant  with her exercises, notices some pain with standing calf stretch. She ambulates with SPC in RUE with an antalgic gait. Session today focused on sequencing gait with cane in LUE as well as ambulating up/down stairs with and without cane and handrails. Patient was able to tolerate all prescribed exercises with no adverse effects. Patient continues to benefit from skilled PT services and should be progressed as able to improve functional independence.   EVAL: Patient is a 68 y.o. female who was seen today for physical therapy evaluation and treatment for M72.2 (ICD-10-CM) - Plantar fascial fibromatosis. Pt presents with significant R plantar heel pain which is having a marked impact on her functional mobility and QOL. Pt is currently, using a SPC to reduce wt bearing through her R heel. Pt was initiated on a HEP and after completion today reported good pain relief, and she demonstrated an improved gait. Pt will benefit from skilled PT 2w6 to address impairments to optimize function with less pain.   OBJECTIVE IMPAIRMENTS: decreased activity tolerance, difficulty walking, increased edema, and pain.   ACTIVITY LIMITATIONS: carrying, sitting, standing, stairs, and locomotion level  PARTICIPATION LIMITATIONS: meal prep, cleaning, laundry, driving, shopping, community activity, and occupation  PERSONAL FACTORS: Past/current experiences and Time since onset of injury/illness/exacerbation are also affecting patient's functional outcome.   REHAB POTENTIAL: Good  CLINICAL DECISION MAKING: Evolving/moderate complexity  EVALUATION COMPLEXITY: Moderate   GOALS:  SHORT TERM GOALS: Target date: 10/23/23 Pt will be Ind in an initial HEP  Baseline: started Goal status: INITIAL  2.  Pt will report 25% improvement in R plantar heel pain for improved function and quality of life Baseline: 2-10/10 Goal status: INITIAL  LONG TERM GOALS: Target date: 11/18/23  Pt will be Ind in a final HEP to maintain achieved  LOF  Baseline:  Goal status: INITIAL  2.  2.  Pt will report 75% improvement in R plantar heel pain for improved function and quality of life Baseline: 2-10/10 Goal status: INITIAL  3.  Pt will walk with a normalized gait pattern s an AD with increased pace and step length Baseline: c a SPC, antalgic, slow pace Goal status: INITIAL  4.  Pt will be able to initiate a walking program being able to walk 20 mins with no more than a 2 point increase in her R plantar heel pain Baseline:  Goal status: INITIAL  5.  Pt's LEFS score will improve to 50% or greater as indication of improved function  Baseline: 9% Goal status: INITIAL  PLAN:  PT FREQUENCY: 2x/week  PT DURATION: 6 weeks  PLANNED INTERVENTIONS: 97164- PT Re-evaluation, 97110-Therapeutic exercises, 97530- Therapeutic activity, 97112- Neuromuscular re-education, 97535- Self Care, [id number]- Manual therapy, [id number]- Gait training, [phone]- Aquatic Therapy, [phone]- Electrical stimulation (unattended), [phone]- Ionotophoresis 4mg /ml Dexamethasone , 79439 (1-2 muscles), [id number] (3+ muscles)- Dry Needling, Patient/Family education, Balance training, Stair training, Taping, Joint mobilization, Cryotherapy, and Moist heat  PLAN FOR NEXT SESSION: Assess response to HEP; progress therex as indicated; use of modalities, manual therapy; and TPDN as indicated.   Corean Pouch PTA  10/12/23 6:29 PM

## 2023-10-14 ENCOUNTER — Ambulatory Visit

## 2023-10-14 DIAGNOSIS — R262 Difficulty in walking, not elsewhere classified: Secondary | ICD-10-CM

## 2023-10-14 DIAGNOSIS — M79671 Pain in right foot: Secondary | ICD-10-CM | POA: Diagnosis not present

## 2023-10-14 NOTE — Therapy (Signed)
 OUTPATIENT PHYSICAL THERAPY TREATMENT NOTE   Patient Name: YARELLY KUBA MRN: [id number] DOB:[birth year], 68 y.o., female Today's Date: 10/14/2023  END OF SESSION:  PT End of Session - 10/14/23 1210     Visit Number 3    Number of Visits 13    Date for PT Re-Evaluation 11/18/23    Authorization Type [employer]    PT Start Time 1210    PT Stop Time 1250    PT Time Calculation (min) 40 min    Activity Tolerance Patient tolerated treatment well    Behavior During Therapy [facility] for tasks assessed/performed          Past Medical History:  Diagnosis Date   ALLERGIC RHINITIS 07/03/2008   Allergy    seasonal   ANXIETY 06/02/2007   Arthritis    BELL'S PALSY, LEFT 12/02/2008   Cancer (HCC) 01/16/2015   kidney   CELLULITIS, LEFT LEG 09/14/2008   CHEST PAIN 07/07/2009   Complication of anesthesia 08/28/2014   work up in recovery room with jaw locked open   CORNS AND CALLUSES 07/03/2008   Depression    FOOT PAIN 09/22/2009   History of kidney cancer    Hyperlipidemia    IRREGULAR MENSES 06/03/2008   Neoplasm    left renal   OCD (obsessive compulsive disorder)    Renal disorder    Stress fracture of ankle since Jun 29, 2014   left   Supraventricular tachycardia (HCC) 06/02/2007   Atrial tachycardia with Wenckebach periodicity   Past Surgical History:  Procedure Laterality Date   APPENDECTOMY  20016   BLEPHAROPLASTY Bilateral 10/31/2017   COLONOSCOPY  2009   HIP ARTHROPLASTY Right 11/18/2017   LAPAROSCOPIC APPENDECTOMY N/A 08/28/2014   Procedure: APPENDECTOMY LAPAROSCOPIC;  Surgeon: Lynwood Pina, MD;  Location: [facility] OR;  Service: General;  Laterality: N/A;   ROBOTIC ASSITED PARTIAL NEPHRECTOMY Left 01/06/2015   Procedure: ROBOTIC ASSISTED PARTIAL NEPHRECTOMY;  Surgeon: Gretel Ferrara, MD;  Location: [facility] ORS;  Service: Urology;  Laterality: Left;   WISDOM TOOTH EXTRACTION     Patient Active Problem List   Diagnosis Date Noted   Rash 05/12/2023   Tinnitus of  both ears 12/26/2022   Sensorineural hearing loss, bilateral 12/26/2022   Impacted cerumen of both ears 12/26/2022   Chest pain 06/03/2022   Sprain of right wrist 03/11/2022   Sprain of left ankle 01/21/2022   Acute dysfunction of left eustachian tube 09/30/2021   Left hip pain 07/09/2021   Osteoporosis 09/22/2019   Loss of transverse plantar arch 12/04/2018   Renal cell cancer (HCC) 08/11/2018   Refusal of blood transfusions as patient is Jehovah's Witness 08/11/2018   HLD (hyperlipidemia) 08/11/2018   Encounter for screening for cervical cancer 03/29/2017   Preventative health care 03/29/2017   Colon cancer screening 12/04/2015   Open bite of right thumb without damage to nail 08/04/2015   Menopause syndrome 02/11/2011   Allergic rhinitis 07/03/2008   Supraventricular tachycardia (HCC) 06/02/2007   Generalized anxiety disorder 06/02/2007    PCP: Norleen Lynwood ORN, MD  REFERRING PROVIDER: Aniceto Eva Grebe, PA-C   REFERRING DIAG: M72.2 (ICD-10-CM) - Plantar fascial fibromatosis   THERAPY DIAG:  Pain in right foot  Difficulty in walking, not elsewhere classified  Rationale for Evaluation and Treatment: Rehabilitation  ONSET DATE: April  SUBJECTIVE:   SUBJECTIVE STATEMENT: Patient reports 10/10 pain in her right heel today, states she has been busy this morning and has noticed a gradual increase in pain since she got  up.  EVAL: Pt is not aware of an overt precipitating incident, but it may have been related to a couple of days of standing on a ladder. Prednisone , icing, voltaren  have not seemed to help. Is currently using a SPC to decrease wt bearing on the R foot. Pt has found shoes with wide high heels have been the most comfortable shoes for her to wear. Even more so than her Hokas. Stretching has also helped some. She does not feel like it is getting better. Pt reports recently spraining the R great toe which is getting better.  PERTINENT HISTORY: See above  PAIN:   Are you having pain? Yes: NPRS scale: 2-10/10 Pain location: Medial heel, ankle, plantar foor Pain description: sharp, throb Aggravating factors: Increased time on feet- standing and walking, driving Relieving factors: SPC  PRECAUTIONS: None  RED FLAGS: None   WEIGHT BEARING RESTRICTIONS: No  FALLS:  Has patient fallen in last 6 months? 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 Blain after the the MRI 10/10/23  OBJECTIVE:  Note: Objective measures were completed at Evaluation unless otherwise noted.  DIAGNOSTIC FINDINGS: None for the R heel  PATIENT SURVEYS:  LEFS: 7/80= 9%  COGNITION: Overall cognitive status: Within functional limits for tasks assessed     SENSATION: WFL  EDEMA:  R medial heel swelling   POSTURE: neutral arch  PALPATION: TTPto the R medial heel and plantar fascia  LOWER EXTREMITY ROM:  Active ROM Right eval Left eval  Hip flexion    Hip extension    Hip abduction    Hip adduction    Hip internal rotation    Hip external rotation    Knee flexion    Knee extension    Ankle dorsiflexion 12 12  Ankle plantarflexion    Ankle inversion    Ankle eversion     (Blank rows = not tested)  LOWER EXTREMITY MMT:  WNLs for both LEs MMT Right eval Left eval  Hip flexion    Hip extension    Hip abduction    Hip adduction    Hip internal rotation    Hip external rotation    Knee flexion    Knee extension    Ankle dorsiflexion    Ankle plantarflexion    Ankle inversion    Ankle eversion     (Blank rows = not tested)   GAIT: Distance walked: 200' Assistive device utilized:  Single point cane Level of assistance: Modified independence Comments: Antalgic gait pattern                                                                                                                TREATMENT  [facility] Adult PT Treatment:                                                DATE: 10/14/23 Therapeutic Exercise: Nustep level 5 x 8 mins while gathering subjective and planning session with patient Seated heel raise on 2 step 15# KB above knee 2x10 Therapeutic Activity: Walking with SPC in LUE - cues for form, pacing, sequencing 1 lap x185' Manual Therapy: IASTM to R calf, achilles, heel, and plantar surface of foot; pt positioned in prone  [facility] Adult PT Treatment:                                                DATE: 10/12/23 Therapeutic Exercise: Nustep level 4 x 5 mins while gathering subjective and planning session with patient Slant board gastroc stretch 2x1' RLE only Slant board soleus stretch 2x1' RLE Therapeutic Activity: SPC training, moving cane to LUE - cues for sequencing, pacing, form Stair training with cane and no handrails, with handrails, using short stairs  Manual Therapy: IASTM to R calf, achilles, heel, and plantar surface of foot (next session)   [facility] Adult PT Treatment:                                      DATE: 09/27/23 Therapeutic Exercise:  Developed, instructed in, and pt completed therex as noted in HEP  Self Care: Load management for tissue remodeling and to complete the heel raise ex every other day   PATIENT EDUCATION:  Education details: Eval findings, POC, HEP, self care  Person educated: Patient Education method: Explanation, Demonstration, Tactile cues, Verbal cues, and Handouts Education comprehension: verbalized understanding, returned demonstration, verbal cues required, and tactile cues required  HOME EXERCISE PROGRAM: Access Code: [email or web address] URL: [email or web address] Date: 09/27/2023 Prepared by: Dasie Daft  Exercises - Gastroc Stretch on Wall (Mirrored)  - 1 x daily - 7 x weekly - 1 sets - 2-3 reps - 60 hold - Long Sitting Calf Stretch with Strap  - 1 x daily - 7 x weekly - 1 sets - 2-3 reps - 60 hold - Standing Heel Raises  - 1 x daily - 7 x weekly - 2 sets - 5 reps - 10 hold  ASSESSMENT:  CLINICAL IMPRESSION: Patient presents to PT reporting 10/10 pain in her right heel, states it has been gradually feeling worse this morning since she has been busy. Session today focused on use of manual techniques to decrease tension and pain as well as gentle strengthening. She does notice some pain relief with manual techniques from 10/10 to 8/10. Patient was able to tolerate all prescribed exercises with no adverse effects. Patient continues to benefit from skilled PT services and should be progressed as able to improve functional independence.   EVAL: Patient is a 68 y.o. female who was seen today for physical therapy evaluation and treatment for M72.2 (ICD-10-CM) - Plantar fascial fibromatosis. Pt presents with significant R plantar heel pain which is having a marked impact on her functional mobility and QOL. Pt is currently, using a SPC to reduce wt bearing through her R heel. Pt was initiated  on a HEP and after completion today reported good pain relief, and she demonstrated an improved gait. Pt will benefit from skilled PT 2w6 to address impairments to optimize function with less pain.   OBJECTIVE IMPAIRMENTS: decreased activity tolerance, difficulty walking, increased edema, and pain.   ACTIVITY LIMITATIONS: carrying, sitting, standing, stairs, and locomotion level  PARTICIPATION LIMITATIONS: meal prep, cleaning, laundry, driving, shopping, community activity, and occupation  PERSONAL FACTORS: Past/current experiences and Time since onset of injury/illness/exacerbation are also affecting patient's functional outcome.   REHAB POTENTIAL: Good  CLINICAL DECISION MAKING: Evolving/moderate  complexity  EVALUATION COMPLEXITY: Moderate   GOALS:  SHORT TERM GOALS: Target date: 10/23/23 Pt will be Ind in an initial HEP  Baseline: started Goal status: INITIAL  2.  Pt will report 25% improvement in R plantar heel pain for improved function and quality of life Baseline: 2-10/10 Goal status: INITIAL  LONG TERM GOALS: Target date: 11/18/23  Pt will be Ind in a final HEP to maintain achieved LOF  Baseline:  Goal status: INITIAL  2.  2.  Pt will report 75% improvement in R plantar heel pain for improved function and quality of life Baseline: 2-10/10 Goal status: INITIAL  3.  Pt will walk with a normalized gait pattern s an AD with increased pace and step length Baseline: c a SPC, antalgic, slow pace Goal status: INITIAL  4.  Pt will be able to initiate a walking program being able to walk 20 mins with no more than a 2 point increase in her R plantar heel pain Baseline:  Goal status: INITIAL  5.  Pt's LEFS score will improve to 50% or greater as indication of improved function  Baseline: 9% Goal status: INITIAL  PLAN:  PT FREQUENCY: 2x/week  PT DURATION: 6 weeks  PLANNED INTERVENTIONS: 97164- PT Re-evaluation, 97110-Therapeutic exercises, 97530- Therapeutic activity, 97112- Neuromuscular re-education, 97535- Self Care, 02859- Manual therapy, [id number]- Gait training, [phone]- Aquatic Therapy, [phone]- Electrical stimulation (unattended), [phone]- Ionotophoresis 4mg /ml Dexamethasone , 79439 (1-2 muscles), 20561 (3+ muscles)- Dry Needling, Patient/Family education, Balance training, Stair training, Taping, Joint mobilization, Cryotherapy, and Moist heat  PLAN FOR NEXT SESSION: Assess response to HEP; progress therex as indicated; use of modalities, manual therapy; and TPDN as indicated.   Corean Pouch PTA  10/14/23 12:55 PM

## 2023-10-17 ENCOUNTER — Encounter: Payer: Self-pay | Admitting: Physical Therapy

## 2023-10-17 ENCOUNTER — Ambulatory Visit: Admitting: Physical Therapy

## 2023-10-17 DIAGNOSIS — M79671 Pain in right foot: Secondary | ICD-10-CM

## 2023-10-17 DIAGNOSIS — M722 Plantar fascial fibromatosis: Secondary | ICD-10-CM | POA: Diagnosis not present

## 2023-10-17 DIAGNOSIS — R262 Difficulty in walking, not elsewhere classified: Secondary | ICD-10-CM

## 2023-10-17 NOTE — Therapy (Signed)
 OUTPATIENT PHYSICAL THERAPY TREATMENT NOTE   Patient Name: Shelby Grant MRN: [id number] DOB:[birth year], 68 y.o., female Today's Date: 10/17/2023  END OF SESSION:  PT End of Session - 10/17/23 1451     Visit Number 4    Number of Visits 13    Date for PT Re-Evaluation 11/18/23    Authorization Type [employer]    PT Start Time 0248    PT Stop Time 0330    PT Time Calculation (min) 42 min          Past Medical History:  Diagnosis Date   ALLERGIC RHINITIS 07/03/2008   Allergy    seasonal   ANXIETY 06/02/2007   Arthritis    BELL'S PALSY, LEFT 12/02/2008   Cancer (HCC) 01/16/2015   kidney   CELLULITIS, LEFT LEG 09/14/2008   CHEST PAIN 07/07/2009   Complication of anesthesia 08/28/2014   work up in recovery room with jaw locked open   CORNS AND CALLUSES 07/03/2008   Depression    FOOT PAIN 09/22/2009   History of kidney cancer    Hyperlipidemia    IRREGULAR MENSES 06/03/2008   Neoplasm    left renal   OCD (obsessive compulsive disorder)    Renal disorder    Stress fracture of ankle since Jun 29, 2014   left   Supraventricular tachycardia (HCC) 06/02/2007   Atrial tachycardia with Wenckebach periodicity   Past Surgical History:  Procedure Laterality Date   APPENDECTOMY  20016   BLEPHAROPLASTY Bilateral 10/31/2017   COLONOSCOPY  2009   HIP ARTHROPLASTY Right 11/18/2017   LAPAROSCOPIC APPENDECTOMY N/A 08/28/2014   Procedure: APPENDECTOMY LAPAROSCOPIC;  Surgeon: Lynwood Pina, MD;  Location: [facility] OR;  Service: General;  Laterality: N/A;   ROBOTIC ASSITED PARTIAL NEPHRECTOMY Left 01/06/2015   Procedure: ROBOTIC ASSISTED PARTIAL NEPHRECTOMY;  Surgeon: Gretel Ferrara, MD;  Location: [facility];  Service: Urology;  Laterality: Left;   WISDOM TOOTH EXTRACTION     Patient Active Problem List   Diagnosis Date Noted   Rash 05/12/2023   Tinnitus of both ears 12/26/2022   Sensorineural hearing loss, bilateral 12/26/2022   Impacted cerumen of both ears 12/26/2022    Chest pain 06/03/2022   Sprain of right wrist 03/11/2022   Sprain of left ankle 01/21/2022   Acute dysfunction of left eustachian tube 09/30/2021   Left hip pain 07/09/2021   Osteoporosis 09/22/2019   Loss of transverse plantar arch 12/04/2018   Renal cell cancer (HCC) 08/11/2018   Refusal of blood transfusions as patient is Jehovah's Witness 08/11/2018   HLD (hyperlipidemia) 08/11/2018   Encounter for screening for cervical cancer 03/29/2017   Preventative health care 03/29/2017   Colon cancer screening 12/04/2015   Open bite of right thumb without damage to nail 08/04/2015   Menopause syndrome 02/11/2011   Allergic rhinitis 07/03/2008   Supraventricular tachycardia (HCC) 06/02/2007   Generalized anxiety disorder 06/02/2007    PCP: Norleen Lynwood ORN, MD  REFERRING PROVIDER: Aniceto Eva Grebe, PA-C   REFERRING DIAG: M72.2 (ICD-10-CM) - Plantar fascial fibromatosis   THERAPY DIAG:  Pain in right foot  Difficulty in walking, not elsewhere classified  Rationale for Evaluation and Treatment: Rehabilitation  ONSET DATE: April  SUBJECTIVE:   SUBJECTIVE STATEMENT: Patient reports 10/10 pain in her right heel today, states she has been busy this morning and has noticed a gradual increase in pain since she got up.  EVAL: Pt is not aware of an overt precipitating incident, but it may have been related to  a couple of days of standing on a ladder. Prednisone , icing, voltaren  have not seemed to help. Is currently using a SPC to decrease wt bearing on the R foot. Pt has found shoes with wide high heels have been the most comfortable shoes for her to wear. Even more so than her Hokas. Stretching has also helped some. She does not feel like it is getting better. Pt reports recently spraining the R great toe which is getting better.  PERTINENT HISTORY: See above  PAIN:  Are you having pain? Yes: NPRS scale: 8/10 Pain location: Medial heel, ankle, plantar foor Pain description: sharp,  throb Aggravating factors: Increased time on feet- standing and walking, driving Relieving factors: SPC  PRECAUTIONS: None  RED FLAGS: None   WEIGHT BEARING RESTRICTIONS: No  FALLS:  Has patient fallen in last 6 months? 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 Blain after the the MRI 10/10/23   OBJECTIVE:  Note: Objective measures were completed at Evaluation unless otherwise noted.  DIAGNOSTIC FINDINGS: None for the R heel  10/17/23: MR right ankle 10/10/2023 Impression:  Severe plantar fascitis of the medial cord at the calcaneal attachment, with low grade partial tearing of the medial half of the medial cord origin Mild posterior tib tenosynovitis Pre achilles fat pad edema, suggesting impingement. The achilles tendon is normal Posterior subtalar joint effusion, but without cartilage abnormality or other arthritic changes in this joint.   PATIENT SURVEYS:  LEFS: 7/80= 9%  COGNITION: Overall cognitive status: Within functional limits for tasks assessed     SENSATION: WFL  EDEMA:  R medial heel swelling   POSTURE: neutral arch  PALPATION: TTPto the R medial heel and plantar fascia  LOWER EXTREMITY ROM:  Active ROM Right eval Left eval  Hip flexion    Hip extension    Hip abduction    Hip adduction    Hip internal rotation    Hip external rotation    Knee flexion    Knee extension    Ankle dorsiflexion 12 12  Ankle plantarflexion    Ankle inversion    Ankle eversion     (Blank rows = not tested)  LOWER EXTREMITY MMT:  WNLs for both LEs MMT Right eval Left eval  Hip flexion    Hip  extension    Hip abduction    Hip adduction    Hip internal rotation    Hip external rotation    Knee flexion    Knee extension    Ankle dorsiflexion    Ankle plantarflexion    Ankle inversion    Ankle eversion     (Blank rows = not tested)   GAIT: Distance walked: 200' Assistive device utilized: Single point cane Level of assistance: Modified independence Comments: Antalgic gait pattern  TREATMENT DATE:  [facility] Adult PT Treatment:                                                DATE: 10/17/23 Therapeutic Exercise: Toe scrunches  Toe Yoga  Plantar fascia stretch Gastroc stretch towel  Soleus stretch with towel  4 way ankle with red /green therabands=HEP Manual Therapy: Athletic tape used for heel pad taping  Self Care: Use of Ice massage  Consider using can boot per MD option   [facility] Adult PT Treatment:                                                DATE: 10/14/23 Therapeutic Exercise: Nustep level 5 x 8 mins while gathering subjective and planning session with patient Seated heel raise on 2 step 15# KB above knee 2x10 Therapeutic Activity: Walking with SPC in LUE - cues for form, pacing, sequencing 1 lap x185' Manual Therapy: IASTM to R calf, achilles, heel, and plantar surface of foot; pt positioned in prone  [facility] Adult PT Treatment:                                                DATE: 10/12/23 Therapeutic Exercise: Nustep level 4 x 5 mins while gathering subjective and planning session with patient Slant board gastroc stretch 2x1' RLE only Slant board soleus stretch 2x1' RLE Therapeutic Activity: SPC training, moving cane to LUE - cues for sequencing, pacing, form Stair training with cane and no handrails, with handrails, using short stairs  Manual Therapy: IASTM to R calf, achilles, heel, and plantar surface of foot (next session)   [facility] Adult PT  Treatment:                                      DATE: 09/27/23 Therapeutic Exercise:  Developed, instructed in, and pt completed therex as noted in HEP  Self Care: Load management for tissue remodeling and to complete the heel raise ex every other day   PATIENT EDUCATION:  Education details: Eval findings, POC, HEP, self care  Person educated: Patient Education method: Explanation, Demonstration, Tactile cues, Verbal cues, and Handouts Education comprehension: verbalized understanding, returned demonstration, verbal cues required, and tactile cues required  HOME EXERCISE PROGRAM: Access Code: [email or web address] URL: [email or web address] Date: 09/27/2023 Prepared by: Dasie Daft  Exercises - Gastroc Stretch on Wall (Mirrored)  - 1 x daily - 7 x weekly - 1 sets - 2-3 reps - 60 hold - Long Sitting Calf Stretch with Strap  - 1 x daily - 7 x weekly - 1 sets - 2-3 reps - 60 hold - Standing Heel Raises  - 1 x daily - 7 x weekly - 2 sets - 5 reps - 10 hold  ASSESSMENT:  CLINICAL IMPRESSION: Patient presents to PT reporting 8/10 pain in her right heel, got MRI results which show severe plantar fascitis. MD recommended continued PT, possible OTC NSAIDS, possible Cam boot. She is not wearing any shoe inserts. Reviewed open chain calf stretches and  plantar fascia stretches. Also worked on intrinsic foot strength and open chain ankle strengthening. Trial of heel pad tape but did not have the best tape today. Discussed other self care options like trial of Cam boot when ambulating per MD options and using ice massage 3 x per day. She will be on feet a lot moving locations. She can also be evaluated at foot store for insert suggestions. She brought MRI results today and they were copied above. Patient was able to tolerate all prescribed exercises with no adverse effects. Patient continues to benefit from skilled PT services and should be progressed as able to improve functional independence.   EVAL:  Patient is a 68 y.o. female who was seen today for physical therapy evaluation and treatment for M72.2 (ICD-10-CM) - Plantar fascial fibromatosis. Pt presents with significant R plantar heel pain which is having a marked impact on her functional mobility and QOL. Pt is currently, using a SPC to reduce wt bearing through her R heel. Pt was initiated on a HEP and after completion today reported good pain relief, and she demonstrated an improved gait. Pt will benefit from skilled PT 2w6 to address impairments to optimize function with less pain.   OBJECTIVE IMPAIRMENTS: decreased activity tolerance, difficulty walking, increased edema, and pain.   ACTIVITY LIMITATIONS: carrying, sitting, standing, stairs, and locomotion level  PARTICIPATION LIMITATIONS: meal prep, cleaning, laundry, driving, shopping, community activity, and occupation  PERSONAL FACTORS: Past/current experiences and Time since onset of injury/illness/exacerbation are also affecting patient's functional outcome.   REHAB POTENTIAL: Good  CLINICAL DECISION MAKING: Evolving/moderate complexity  EVALUATION COMPLEXITY: Moderate   GOALS:  SHORT TERM GOALS: Target date: 10/23/23 Pt will be Ind in an initial HEP  Baseline: started Goal status: INITIAL  2.  Pt will report 25% improvement in R plantar heel pain for improved function and quality of life Baseline: 2-10/10 Goal status: INITIAL  LONG TERM GOALS: Target date: 11/18/23  Pt will be Ind in a final HEP to maintain achieved LOF  Baseline:  Goal status: INITIAL  2.  2.  Pt will report 75% improvement in R plantar heel pain for improved function and quality of life Baseline: 2-10/10 Goal status: INITIAL  3.  Pt will walk with a normalized gait pattern s an AD with increased pace and step length Baseline: c a SPC, antalgic, slow pace Goal status: INITIAL  4.  Pt will be able to initiate a walking program being able to walk 20 mins with no more than a 2 point increase  in her R plantar heel pain Baseline:  Goal status: INITIAL  5.  Pt's LEFS score will improve to 50% or greater as indication of improved function  Baseline: 9% Goal status: INITIAL  PLAN:  PT FREQUENCY: 2x/week  PT DURATION: 6 weeks  PLANNED INTERVENTIONS: 97164- PT Re-evaluation, 97110-Therapeutic exercises, 97530- Therapeutic activity, 97112- Neuromuscular re-education, 97535- Self Care, 02859- Manual therapy, [id number]- Gait training, [phone]- Aquatic Therapy, [phone]- Electrical stimulation (unattended), [phone]- Ionotophoresis 4mg /ml Dexamethasone , 79439 (1-2 muscles), 20561 (3+ muscles)- Dry Needling, Patient/Family education, Balance training, Stair training, Taping, Joint mobilization, Cryotherapy, and Moist heat  PLAN FOR NEXT SESSION: Assess response to HEP; progress therex as indicated; use of modalities, manual therapy; and TPDN as indicated.   Harlene Persons, PTA 10/17/23 3:54 PM Phone: [phone] Fax: [phone]

## 2023-10-18 DIAGNOSIS — Z1231 Encounter for screening mammogram for malignant neoplasm of breast: Secondary | ICD-10-CM | POA: Diagnosis not present

## 2023-10-18 LAB — HM MAMMOGRAPHY

## 2023-10-19 NOTE — Therapy (Incomplete)
 OUTPATIENT PHYSICAL THERAPY TREATMENT NOTE   Patient Name: Shelby Grant MRN: [id number] DOB:12-May-1955, 68 y.o., female Today's Date: 10/19/2023  END OF SESSION:    Past Medical History:  Diagnosis Date   ALLERGIC RHINITIS 07/03/2008   Allergy    seasonal   ANXIETY 06/02/2007   Arthritis    BELL'S PALSY, LEFT 12/02/2008   Cancer (HCC) 01/16/2015   kidney   CELLULITIS, LEFT LEG 09/14/2008   CHEST PAIN 07/07/2009   Complication of anesthesia 08/28/2014   work up in recovery room with jaw locked open   CORNS AND CALLUSES 07/03/2008   Depression    FOOT PAIN 09/22/2009   History of kidney cancer    Hyperlipidemia    IRREGULAR MENSES 06/03/2008   Neoplasm    left renal   OCD (obsessive compulsive disorder)    Renal disorder    Stress fracture of ankle since Jun 29, 2014   left   Supraventricular tachycardia (HCC) 06/02/2007   Atrial tachycardia with Wenckebach periodicity   Past Surgical History:  Procedure Laterality Date   APPENDECTOMY  20016   BLEPHAROPLASTY Bilateral 10/31/2017   COLONOSCOPY  2009   HIP ARTHROPLASTY Right 11/18/2017   LAPAROSCOPIC APPENDECTOMY N/A 08/28/2014   Procedure: APPENDECTOMY LAPAROSCOPIC;  Surgeon: Lynwood Pina, MD;  Location: [facility] OR;  Service: General;  Laterality: N/A;   ROBOTIC ASSITED PARTIAL NEPHRECTOMY Left 01/06/2015   Procedure: ROBOTIC ASSISTED PARTIAL NEPHRECTOMY;  Surgeon: Gretel Ferrara, MD;  Location: [facility];  Service: Urology;  Laterality: Left;   WISDOM TOOTH EXTRACTION     Patient Active Problem List   Diagnosis Date Noted   Rash 05/12/2023   Tinnitus of both ears 12/26/2022   Sensorineural hearing loss, bilateral 12/26/2022   Impacted cerumen of both ears 12/26/2022   Chest pain 06/03/2022   Sprain of right wrist 03/11/2022   Sprain of left ankle 01/21/2022   Acute dysfunction of left eustachian tube 09/30/2021   Left hip pain 07/09/2021   Osteoporosis 09/22/2019   Loss of transverse plantar arch 12/04/2018    Renal cell cancer (HCC) 08/11/2018   Refusal of blood transfusions as patient is Jehovah's Witness 08/11/2018   HLD (hyperlipidemia) 08/11/2018   Encounter for screening for cervical cancer 03/29/2017   Preventative health care 03/29/2017   Colon cancer screening 12/04/2015   Open bite of right thumb without damage to nail 08/04/2015   Menopause syndrome 02/11/2011   Allergic rhinitis 07/03/2008   Supraventricular tachycardia (HCC) 06/02/2007   Generalized anxiety disorder 06/02/2007    PCP: Norleen Lynwood ORN, MD  REFERRING PROVIDER: Aniceto Eva Grebe, PA-C   REFERRING DIAG: M72.2 (ICD-10-CM) - Plantar fascial fibromatosis   THERAPY DIAG:  No diagnosis found.  Rationale for Evaluation and Treatment: Rehabilitation  ONSET DATE: April  SUBJECTIVE:   SUBJECTIVE STATEMENT: Patient reports 10/10 pain in her right heel today, states she has been busy this morning and has noticed a gradual increase in pain since she got up.  EVAL: Pt is not aware of an overt precipitating incident, but it may have been related to a couple of days of standing on a ladder. Prednisone , icing, voltaren  have not seemed to help. Is currently using a SPC to decrease wt bearing on the R foot. Pt has found shoes with wide high heels have been the most comfortable shoes for her to wear. Even more so than her Hokas. Stretching has also helped some. She does not feel like it is getting better. Pt reports recently spraining the R great  toe which is getting better.  PERTINENT HISTORY: See above  PAIN:  Are you having pain? Yes: NPRS scale: 8/10 Pain location: Medial heel, ankle, plantar foor Pain description: sharp, throb Aggravating factors: Increased time on feet- standing and walking, driving Relieving factors: SPC  PRECAUTIONS: None  RED FLAGS: None   WEIGHT BEARING RESTRICTIONS: No  FALLS:  Has patient fallen in last 6 months? 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 Blain after the the MRI 10/10/23   OBJECTIVE:  Note: Objective measures were completed at Evaluation unless otherwise noted.  DIAGNOSTIC FINDINGS: None for the R heel  10/17/23: MR right ankle 10/10/2023 Impression:  Severe plantar fascitis of the medial cord at the calcaneal attachment, with low grade partial tearing of the medial half of the medial cord origin Mild posterior tib tenosynovitis Pre achilles fat pad edema, suggesting impingement. The achilles tendon is normal Posterior subtalar joint effusion, but without cartilage abnormality or other arthritic changes in this joint.   PATIENT SURVEYS:  LEFS: 7/80= 9%  COGNITION: Overall cognitive status: Within functional limits for tasks assessed     SENSATION: WFL  EDEMA:  R medial heel swelling   POSTURE: neutral arch  PALPATION: TTPto the R medial heel and plantar fascia  LOWER EXTREMITY ROM:  Active ROM Right eval Left eval  Hip flexion    Hip extension    Hip abduction    Hip adduction    Hip internal rotation    Hip external rotation    Knee flexion    Knee extension    Ankle dorsiflexion 12 12  Ankle plantarflexion    Ankle inversion    Ankle eversion     (Blank rows = not tested)  LOWER EXTREMITY MMT:  WNLs for both LEs MMT Right eval Left eval  Hip flexion    Hip extension    Hip abduction    Hip adduction    Hip internal rotation    Hip external rotation    Knee flexion    Knee extension    Ankle dorsiflexion    Ankle plantarflexion    Ankle inversion    Ankle eversion     (Blank rows = not tested)   GAIT: Distance walked: 200' Assistive device  utilized: Single point cane Level of assistance: Modified independence Comments: Antalgic gait pattern                                                                                                                TREATMENT DATE:  [facility] Adult PT Treatment:  DATE: 10/20/23 Therapeutic Exercise: Toe scrunches  Toe Yoga  Plantar fascia stretch Gastroc stretch towel  Soleus stretch with towel  4 way ankle with red /green therabands=HEP Manual Therapy: Athletic tape used for heel pad taping  Self Care: Use of Ice massage  Consider using can boot per MD optionTherapeutic Exercise: *** Manual Therapy: *** Neuromuscular re-ed: *** Therapeutic Activity: *** Modalities: *** Self Care: [facility] Adult PT Treatment:                                                DATE: 10/17/23 Therapeutic Exercise: Toe scrunches  Toe Yoga  Plantar fascia stretch Gastroc stretch towel  Soleus stretch with towel  4 way ankle with red /green therabands=HEP Manual Therapy: Athletic tape used for heel pad taping  Self Care: Use of Ice massage  Consider using can boot per MD option   [facility] Adult PT Treatment:                                                DATE: 10/14/23 Therapeutic Exercise: Nustep level 5 x 8 mins while gathering subjective and planning session with patient Seated heel raise on 2 step 15# KB above knee 2x10 Therapeutic Activity: Walking with SPC in LUE - cues for form, pacing, sequencing 1 lap x185' Manual Therapy: IASTM to R calf, achilles, heel, and plantar surface of foot; pt positioned in prone  [facility] Adult PT Treatment:                                                DATE: 10/12/23 Therapeutic Exercise: Nustep level 4 x 5 mins while gathering subjective and planning session with patient Slant board gastroc stretch 2x1' RLE only Slant board soleus stretch 2x1' RLE Therapeutic Activity: SPC training, moving cane to LUE - cues for  sequencing, pacing, form Stair training with cane and no handrails, with handrails, using short stairs  Manual Therapy: IASTM to R calf, achilles, heel, and plantar surface of foot (next session)   [facility] Adult PT Treatment:                                      DATE: 09/27/23 Therapeutic Exercise:  Developed, instructed in, and pt completed therex as noted in HEP  Self Care: Load management for tissue remodeling and to complete the heel raise ex every other day   PATIENT EDUCATION:  Education details: Eval findings, POC, HEP, self care  Person educated: Patient Education method: Explanation, Demonstration, Tactile cues, Verbal cues, and Handouts Education comprehension: verbalized understanding, returned demonstration, verbal cues required, and tactile cues required  HOME EXERCISE PROGRAM: Access Code: [email or web address] URL: [email or web address] Date: 09/27/2023 Prepared by: Dasie Daft  Exercises - Gastroc Stretch on Wall (Mirrored)  - 1 x daily - 7 x weekly - 1 sets - 2-3 reps - 60 hold - Long Sitting Calf Stretch with Strap  - 1 x daily - 7 x weekly - 1 sets - 2-3 reps - 60 hold - Standing  Heel Raises  - 1 x daily - 7 x weekly - 2 sets - 5 reps - 10 hold  ASSESSMENT:  CLINICAL IMPRESSION: Patient presents to PT reporting 8/10 pain in her right heel, got MRI results which show severe plantar fascitis. MD recommended continued PT, possible OTC NSAIDS, possible Cam boot. She is not wearing any shoe inserts. Reviewed open chain calf stretches and plantar fascia stretches. Also worked on intrinsic foot strength and open chain ankle strengthening. Trial of heel pad tape but did not have the best tape today. Discussed other self care options like trial of Cam boot when ambulating per MD options and using ice massage 3 x per day. She will be on feet a lot moving locations. She can also be evaluated at foot store for insert suggestions. She brought MRI results today and they were copied  above. Patient was able to tolerate all prescribed exercises with no adverse effects. Patient continues to benefit from skilled PT services and should be progressed as able to improve functional independence.   EVAL: Patient is a 68 y.o. female who was seen today for physical therapy evaluation and treatment for M72.2 (ICD-10-CM) - Plantar fascial fibromatosis. Pt presents with significant R plantar heel pain which is having a marked impact on her functional mobility and QOL. Pt is currently, using a SPC to reduce wt bearing through her R heel. Pt was initiated on a HEP and after completion today reported good pain relief, and she demonstrated an improved gait. Pt will benefit from skilled PT 2w6 to address impairments to optimize function with less pain.   OBJECTIVE IMPAIRMENTS: decreased activity tolerance, difficulty walking, increased edema, and pain.   ACTIVITY LIMITATIONS: carrying, sitting, standing, stairs, and locomotion level  PARTICIPATION LIMITATIONS: meal prep, cleaning, laundry, driving, shopping, community activity, and occupation  PERSONAL FACTORS: Past/current experiences and Time since onset of injury/illness/exacerbation are also affecting patient's functional outcome.   REHAB POTENTIAL: Good  CLINICAL DECISION MAKING: Evolving/moderate complexity  EVALUATION COMPLEXITY: Moderate   GOALS:  SHORT TERM GOALS: Target date: 10/23/23 Pt will be Ind in an initial HEP  Baseline: started Goal status: INITIAL  2.  Pt will report 25% improvement in R plantar heel pain for improved function and quality of life Baseline: 2-10/10 Goal status: INITIAL  LONG TERM GOALS: Target date: 11/18/23  Pt will be Ind in a final HEP to maintain achieved LOF  Baseline:  Goal status: INITIAL  2.  2.  Pt will report 75% improvement in R plantar heel pain for improved function and quality of life Baseline: 2-10/10 Goal status: INITIAL  3.  Pt will walk with a normalized gait pattern s an  AD with increased pace and step length Baseline: c a SPC, antalgic, slow pace Goal status: INITIAL  4.  Pt will be able to initiate a walking program being able to walk 20 mins with no more than a 2 point increase in her R plantar heel pain Baseline:  Goal status: INITIAL  5.  Pt's LEFS score will improve to 50% or greater as indication of improved function  Baseline: 9% Goal status: INITIAL  PLAN:  PT FREQUENCY: 2x/week  PT DURATION: 6 weeks  PLANNED INTERVENTIONS: 97164- PT Re-evaluation, 97110-Therapeutic exercises, 97530- Therapeutic activity, 97112- Neuromuscular re-education, 97535- Self Care, 02859- Manual therapy, [id number]- Gait training, [phone]- Aquatic Therapy, [phone]- Electrical stimulation (unattended), [phone]- Ionotophoresis 4mg /ml Dexamethasone , 79439 (1-2 muscles), 20561 (3+ muscles)- Dry Needling, Patient/Family education, Balance training, Stair training, Taping, Joint mobilization, Cryotherapy, and  Moist heat  PLAN FOR NEXT SESSION: Assess response to HEP; progress therex as indicated; use of modalities, manual therapy; and TPDN as indicated.   Harlene Persons, PTA 10/19/23 4:03 PM Phone: [phone] Fax: [phone]

## 2023-10-20 ENCOUNTER — Encounter: Payer: Self-pay | Admitting: Physical Therapy

## 2023-10-20 ENCOUNTER — Ambulatory Visit: Admitting: Physical Therapy

## 2023-10-20 DIAGNOSIS — M79671 Pain in right foot: Secondary | ICD-10-CM

## 2023-10-20 DIAGNOSIS — R262 Difficulty in walking, not elsewhere classified: Secondary | ICD-10-CM

## 2023-10-20 NOTE — Therapy (Signed)
 OUTPATIENT PHYSICAL THERAPY TREATMENT NOTE   Patient Name: Shelby Grant MRN: [id number] DOB:[birth year], 68 y.o., female Today's Date: 10/20/2023  END OF SESSION:  PT End of Session - 10/20/23 1148     Visit Number 5    Number of Visits 13    Date for Recertification  11/18/23    Authorization Type [employer]    PT Start Time 1145    PT Stop Time 1230    PT Time Calculation (min) 45 min          Past Medical History:  Diagnosis Date   ALLERGIC RHINITIS 07/03/2008   Allergy    seasonal   ANXIETY 06/02/2007   Arthritis    BELL'S PALSY, LEFT 12/02/2008   Cancer (HCC) 01/16/2015   kidney   CELLULITIS, LEFT LEG 09/14/2008   CHEST PAIN 07/07/2009   Complication of anesthesia 08/28/2014   work up in recovery room with jaw locked open   CORNS AND CALLUSES 07/03/2008   Depression    FOOT PAIN 09/22/2009   History of kidney cancer    Hyperlipidemia    IRREGULAR MENSES 06/03/2008   Neoplasm    left renal   OCD (obsessive compulsive disorder)    Renal disorder    Stress fracture of ankle since Jun 29, 2014   left   Supraventricular tachycardia (HCC) 06/02/2007   Atrial tachycardia with Wenckebach periodicity   Past Surgical History:  Procedure Laterality Date   APPENDECTOMY  20016   BLEPHAROPLASTY Bilateral 10/31/2017   COLONOSCOPY  2009   HIP ARTHROPLASTY Right 11/18/2017   LAPAROSCOPIC APPENDECTOMY N/A 08/28/2014   Procedure: APPENDECTOMY LAPAROSCOPIC;  Surgeon: Lynwood Pina, MD;  Location: [facility] OR;  Service: General;  Laterality: N/A;   ROBOTIC ASSITED PARTIAL NEPHRECTOMY Left 01/06/2015   Procedure: ROBOTIC ASSISTED PARTIAL NEPHRECTOMY;  Surgeon: Gretel Ferrara, MD;  Location: [facility];  Service: Urology;  Laterality: Left;   WISDOM TOOTH EXTRACTION     Patient Active Problem List   Diagnosis Date Noted   Rash 05/12/2023   Tinnitus of both ears 12/26/2022   Sensorineural hearing loss, bilateral 12/26/2022   Impacted cerumen of both ears 12/26/2022    Chest pain 06/03/2022   Sprain of right wrist 03/11/2022   Sprain of left ankle 01/21/2022   Acute dysfunction of left eustachian tube 09/30/2021   Left hip pain 07/09/2021   Osteoporosis 09/22/2019   Loss of transverse plantar arch 12/04/2018   Renal cell cancer (HCC) 08/11/2018   Refusal of blood transfusions as patient is Jehovah's Witness 08/11/2018   HLD (hyperlipidemia) 08/11/2018   Encounter for screening for cervical cancer 03/29/2017   Preventative health care 03/29/2017   Colon cancer screening 12/04/2015   Open bite of right thumb without damage to nail 08/04/2015   Menopause syndrome 02/11/2011   Allergic rhinitis 07/03/2008   Supraventricular tachycardia (HCC) 06/02/2007   Generalized anxiety disorder 06/02/2007    PCP: Norleen Lynwood ORN, MD  REFERRING PROVIDER: Aniceto Eva Grebe, PA-C   REFERRING DIAG: M72.2 (ICD-10-CM) - Plantar fascial fibromatosis   THERAPY DIAG:  Pain in right foot  Difficulty in walking, not elsewhere classified  Rationale for Evaluation and Treatment: Rehabilitation  ONSET DATE: April  SUBJECTIVE:   SUBJECTIVE STATEMENT: Patient reports 4/10 pain in her right heel today, wearing cam boot. States the cam boot is helping. Had less pain this morning. Tape cause a blister and possibly itching so removed after 1 day.   EVAL: Pt is not aware of an overt precipitating  incident, but it may have been related to a couple of days of standing on a ladder. Prednisone , icing, voltaren  have not seemed to help. Is currently using a SPC to decrease wt bearing on the R foot. Pt has found shoes with wide high heels have been the most comfortable shoes for her to wear. Even more so than her Hokas. Stretching has also helped some. She does not feel like it is getting better. Pt reports recently spraining the R great toe which is getting better.  PERTINENT HISTORY: See above  PAIN:  Are you having pain? Yes: NPRS scale: 4/10 Pain location: Medial heel,  ankle, plantar foor Pain description: sharp, throb Aggravating factors: Increased time on feet- standing and walking, driving Relieving factors: SPC  PRECAUTIONS: None  RED FLAGS: None   WEIGHT BEARING RESTRICTIONS: No  FALLS:  Has patient fallen in last 6 months? 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 Blain after the the MRI 10/10/23   OBJECTIVE:  Note: Objective measures were completed at Evaluation unless otherwise noted.  DIAGNOSTIC FINDINGS: None for the R heel  10/17/23: MR right ankle 10/10/2023 Impression:  Severe plantar fascitis of the medial cord at the calcaneal attachment, with low grade partial tearing of the medial half of the medial cord origin Mild posterior tib tenosynovitis Pre achilles fat pad edema, suggesting impingement. The achilles tendon is normal Posterior subtalar joint effusion, but without cartilage abnormality or other arthritic changes in this joint.   PATIENT SURVEYS:  LEFS: 7/80= 9%  COGNITION: Overall cognitive status: Within functional limits for tasks assessed     SENSATION: WFL  EDEMA:  R medial heel swelling   POSTURE: neutral arch  PALPATION: TTPto the R medial heel and plantar fascia  LOWER EXTREMITY ROM:  Active ROM Right eval Left eval  Hip flexion    Hip extension    Hip abduction    Hip adduction    Hip internal rotation    Hip external rotation    Knee flexion    Knee extension    Ankle dorsiflexion 12 12  Ankle plantarflexion    Ankle inversion    Ankle eversion     (Blank rows = not tested)  LOWER EXTREMITY MMT:  WNLs for both LEs MMT  Right eval Left eval  Hip flexion    Hip extension    Hip abduction    Hip adduction    Hip internal rotation    Hip external rotation    Knee flexion    Knee extension    Ankle dorsiflexion    Ankle plantarflexion    Ankle inversion    Ankle eversion     (Blank rows = not tested)   GAIT: Distance walked: 200' Assistive device utilized: Single point cane Level of assistance: Modified independence Comments: Antalgic gait pattern  TREATMENT DATE:  [facility] Adult PT Treatment:                                                DATE: 10/20/23 Therapeutic Exercise: Toe scrunches  Toe Yoga  Plantar fascia stretch Gastroc stretch towel  Soleus stretch with towel  4 way ankle with red /green therabands Seated bilateral Heel raise - towel rolled under forefoot Seated Right Heel raise 15# - towel rolled under foot Manual Therapy  STW plantar fascia     [facility] Adult PT Treatment:                                                DATE: 10/17/23 Therapeutic Exercise: Toe scrunches  Toe Yoga  Plantar fascia stretch Gastroc stretch towel  Soleus stretch with towel  4 way ankle with red /green therabands=HEP Manual Therapy: Athletic tape used for heel pad taping  Self Care: Use of Ice massage  Consider using can boot per MD option   [facility] Adult PT Treatment:                                                DATE: 10/14/23 Therapeutic Exercise: Nustep level 5 x 8 mins while gathering subjective and planning session with patient Seated heel raise on 2 step 15# KB above knee 2x10 Therapeutic Activity: Walking with SPC in LUE - cues for form, pacing, sequencing 1 lap x185' Manual Therapy: IASTM to R calf, achilles, heel, and plantar surface of foot; pt positioned in prone  [facility] Adult PT Treatment:                                                DATE: 10/12/23 Therapeutic  Exercise: Nustep level 4 x 5 mins while gathering subjective and planning session with patient Slant board gastroc stretch 2x1' RLE only Slant board soleus stretch 2x1' RLE Therapeutic Activity: SPC training, moving cane to LUE - cues for sequencing, pacing, form Stair training with cane and no handrails, with handrails, using short stairs  Manual Therapy: IASTM to R calf, achilles, heel, and plantar surface of foot (next session)   [facility] Adult PT Treatment:                                      DATE: 09/27/23 Therapeutic Exercise:  Developed, instructed in, and pt completed therex as noted in HEP  Self Care: Load management for tissue remodeling and to complete the heel raise ex every other day   PATIENT EDUCATION:  Education details: Eval findings, POC, HEP, self care  Person educated: Patient Education method: Explanation, Demonstration, Tactile cues, Verbal cues, and Handouts Education comprehension: verbalized understanding, returned demonstration, verbal cues required, and tactile cues required  HOME EXERCISE PROGRAM: Access Code: [email or web address] URL: [email or web address] Date: 09/27/2023 Prepared by: Dasie Daft  Exercises - Gastroc Stretch on Wall (Mirrored)  - 1  x daily - 7 x weekly - 1 sets - 2-3 reps - 60 hold - Long Sitting Calf Stretch with Strap  - 1 x daily - 7 x weekly - 1 sets - 2-3 reps - 60 hold - Standing Heel Raises  - 1 x daily - 7 x weekly - 2 sets - 5 reps - 10 hold - Seated Ankle Eversion with Resistance  - 1 x daily - 7 x weekly - 3 sets - 10 reps - Seated Ankle Inversion with Resistance  - 1 x daily - 7 x weekly - 3 sets - 10 reps - Seated Ankle Plantar Flexion with Resistance Loop  - 1 x daily - 7 x weekly - 3 sets - 10 reps - Long Sitting Plantar Fascia Stretch with Towel  - 1 x daily - 7 x weekly - 1 sets - 3 reps - 30 hold - Toe Yoga - Alternating Great Toe and Lesser Toe Extension  - 1 x daily - 7 x weekly - 1 sets - 10  reps  ASSESSMENT:  CLINICAL IMPRESSION: Patient presents to PT reporting reduced pain to  4/10 pain in her right heel, started wearing boot which has helped. Continued open chain stretching and strengthening with good tolerance. Patient was able to tolerate all prescribed exercises with no adverse effects. Patient continues to benefit from skilled PT services and should be progressed as able to improve functional independence.   EVAL: Patient is a 68 y.o. female who was seen today for physical therapy evaluation and treatment for M72.2 (ICD-10-CM) - Plantar fascial fibromatosis. Pt presents with significant R plantar heel pain which is having a marked impact on her functional mobility and QOL. Pt is currently, using a SPC to reduce wt bearing through her R heel. Pt was initiated on a HEP and after completion today reported good pain relief, and she demonstrated an improved gait. Pt will benefit from skilled PT 2w6 to address impairments to optimize function with less pain.   OBJECTIVE IMPAIRMENTS: decreased activity tolerance, difficulty walking, increased edema, and pain.   ACTIVITY LIMITATIONS: carrying, sitting, standing, stairs, and locomotion level  PARTICIPATION LIMITATIONS: meal prep, cleaning, laundry, driving, shopping, community activity, and occupation  PERSONAL FACTORS: Past/current experiences and Time since onset of injury/illness/exacerbation are also affecting patient's functional outcome.   REHAB POTENTIAL: Good  CLINICAL DECISION MAKING: Evolving/moderate complexity  EVALUATION COMPLEXITY: Moderate   GOALS:  SHORT TERM GOALS: Target date: 10/23/23 Pt will be Ind in an initial HEP  Baseline: started Goal status: INITIAL  2.  Pt will report 25% improvement in R plantar heel pain for improved function and quality of life Baseline: 2-10/10 Goal status: INITIAL  LONG TERM GOALS: Target date: 11/18/23  Pt will be Ind in a final HEP to maintain achieved LOF  Baseline:   Goal status: INITIAL  2.  2.  Pt will report 75% improvement in R plantar heel pain for improved function and quality of life Baseline: 2-10/10 Goal status: INITIAL  3.  Pt will walk with a normalized gait pattern s an AD with increased pace and step length Baseline: c a SPC, antalgic, slow pace Goal status: INITIAL  4.  Pt will be able to initiate a walking program being able to walk 20 mins with no more than a 2 point increase in her R plantar heel pain Baseline:  Goal status: INITIAL  5.  Pt's LEFS score will improve to 50% or greater as indication of improved function  Baseline: 9%  Goal status: INITIAL  PLAN:  PT FREQUENCY: 2x/week  PT DURATION: 6 weeks  PLANNED INTERVENTIONS: 97164- PT Re-evaluation, 97110-Therapeutic exercises, 97530- Therapeutic activity, 97112- Neuromuscular re-education, 97535- Self Care, [id number]- Manual therapy, [phone]- Gait training, [phone]- Aquatic Therapy, [phone]- Electrical stimulation (unattended), [phone]- Ionotophoresis 4mg /ml Dexamethasone , 79439 (1-2 muscles), 20561 (3+ muscles)- Dry Needling, Patient/Family education, Balance training, Stair training, Taping, Joint mobilization, Cryotherapy, and Moist heat  PLAN FOR NEXT SESSION: Assess response to HEP; progress therex as indicated; use of modalities, manual therapy; and TPDN as indicated.   Harlene Persons, PTA 10/20/23 1:07 PM Phone: [phone] Fax: [phone]

## 2023-10-24 ENCOUNTER — Encounter: Payer: Self-pay | Admitting: Internal Medicine

## 2023-10-24 NOTE — Therapy (Signed)
 OUTPATIENT PHYSICAL THERAPY TREATMENT NOTE   Patient Name: Shelby Grant MRN: [id number] DOB:08-13-1955, 68 y.o., female Today's Date: 10/25/2023  END OF SESSION:  PT End of Session - 10/25/23 1332     Visit Number 6    Number of Visits 13    Date for Recertification  11/18/23    Authorization Type [employer]    PT Start Time 1330    PT Stop Time 1420    PT Time Calculation (min) 50 min    Activity Tolerance Patient tolerated treatment well    Behavior During Therapy [facility] for tasks assessed/performed           Past Medical History:  Diagnosis Date   ALLERGIC RHINITIS 07/03/2008   Allergy    seasonal   ANXIETY 06/02/2007   Arthritis    BELL'S PALSY, LEFT 12/02/2008   Cancer (HCC) 01/16/2015   kidney   CELLULITIS, LEFT LEG 09/14/2008   CHEST PAIN 07/07/2009   Complication of anesthesia 08/28/2014   work up in recovery room with jaw locked open   CORNS AND CALLUSES 07/03/2008   Depression    FOOT PAIN 09/22/2009   History of kidney cancer    Hyperlipidemia    IRREGULAR MENSES 06/03/2008   Neoplasm    left renal   OCD (obsessive compulsive disorder)    Renal disorder    Stress fracture of ankle since Jun 29, 2014   left   Supraventricular tachycardia 06/02/2007   Atrial tachycardia with Wenckebach periodicity   Past Surgical History:  Procedure Laterality Date   APPENDECTOMY  20016   BLEPHAROPLASTY Bilateral 10/31/2017   COLONOSCOPY  2009   HIP ARTHROPLASTY Right 11/18/2017   LAPAROSCOPIC APPENDECTOMY N/A 08/28/2014   Procedure: APPENDECTOMY LAPAROSCOPIC;  Surgeon: Lynwood Pina, MD;  Location: [facility] OR;  Service: General;  Laterality: N/A;   ROBOTIC ASSITED PARTIAL NEPHRECTOMY Left 01/06/2015   Procedure: ROBOTIC ASSISTED PARTIAL NEPHRECTOMY;  Surgeon: Gretel Ferrara, MD;  Location: [facility] ORS;  Service: Urology;  Laterality: Left;   WISDOM TOOTH EXTRACTION     Patient Active Problem List   Diagnosis Date Noted   Rash 05/12/2023   Tinnitus of both  ears 12/26/2022   Sensorineural hearing loss, bilateral 12/26/2022   Impacted cerumen of both ears 12/26/2022   Chest pain 06/03/2022   Sprain of right wrist 03/11/2022   Sprain of left ankle 01/21/2022   Acute dysfunction of left eustachian tube 09/30/2021   Left hip pain 07/09/2021   Osteoporosis 09/22/2019   Loss of transverse plantar arch 12/04/2018   Renal cell cancer (HCC) 08/11/2018   Refusal of blood transfusions as patient is Jehovah's Witness 08/11/2018   HLD (hyperlipidemia) 08/11/2018   Encounter for screening for cervical cancer 03/29/2017   Preventative health care 03/29/2017   Colon cancer screening 12/04/2015   Open bite of right thumb without damage to nail 08/04/2015   Menopause syndrome 02/11/2011   Allergic rhinitis 07/03/2008   Supraventricular tachycardia (HCC) 06/02/2007   Generalized anxiety disorder 06/02/2007    PCP: Norleen Lynwood ORN, MD  REFERRING PROVIDER: Aniceto Eva Grebe, PA-C   REFERRING DIAG: M72.2 (ICD-10-CM) - Plantar fascial fibromatosis   THERAPY DIAG:  Pain in right foot  Difficulty in walking, not elsewhere classified  Rationale for Evaluation and Treatment: Rehabilitation  ONSET DATE: April  SUBJECTIVE:   SUBJECTIVE STATEMENT: Patient reports her pain level depends on her activity level. After exs she will have significant pain in the center of her heel. Pt states she has  been completing ice massages.   EVAL: Pt is not aware of an overt precipitating incident, but it may have been related to a couple of days of standing on a ladder. Prednisone , icing, voltaren  have not seemed to help. Is currently using a SPC to decrease wt bearing on the R foot. Pt has found shoes with wide high heels have been the most comfortable shoes for her to wear. Even more so than her Hokas. Stretching has also helped some. She does not feel like it is getting better. Pt reports recently spraining the R great toe which is getting better.  PERTINENT  HISTORY: See above  PAIN:  Are you having pain? Yes: NPRS scale: 4/10 Pain location: Medial heel, ankle, plantar foor Pain description: sharp, throb Aggravating factors: Increased time on feet- standing and walking, driving Relieving factors: SPC  PRECAUTIONS: None  RED FLAGS: None   WEIGHT BEARING RESTRICTIONS: No  FALLS:  Has patient fallen in last 6 months? 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 Blain after the the MRI 10/10/23   OBJECTIVE:  Note: Objective measures were completed at Evaluation unless otherwise noted.  DIAGNOSTIC FINDINGS: None for the R heel  10/17/23: MR right ankle 10/10/2023 Impression:  Severe plantar fascitis of the medial cord at the calcaneal attachment, with low grade partial tearing of the medial half of the medial cord origin Mild posterior tib tenosynovitis Pre achilles fat pad edema, suggesting impingement. The achilles tendon is normal Posterior subtalar joint effusion, but without cartilage abnormality or other arthritic changes in this joint.   PATIENT SURVEYS:  LEFS: 7/80= 9%  COGNITION: Overall cognitive status: Within functional limits for tasks assessed     SENSATION: WFL  EDEMA:  R medial heel swelling   POSTURE: neutral arch  PALPATION: TTPto the R medial heel and plantar fascia  LOWER EXTREMITY ROM:  Active ROM Right eval Left eval  Hip flexion    Hip extension    Hip abduction    Hip adduction    Hip internal rotation    Hip external rotation    Knee flexion    Knee extension    Ankle dorsiflexion 12 12  Ankle plantarflexion    Ankle inversion     Ankle eversion     (Blank rows = not tested)  LOWER EXTREMITY MMT:  WNLs for both LEs MMT Right eval Left eval  Hip flexion    Hip extension    Hip abduction    Hip adduction    Hip internal rotation    Hip external rotation    Knee flexion    Knee extension    Ankle dorsiflexion    Ankle plantarflexion    Ankle inversion    Ankle eversion     (Blank rows = not tested)   GAIT: Distance walked: 200' Assistive device utilized: Single point cane Level of assistance: Modified independence Comments: Antalgic gait pattern  TREATMENT DATE:  [facility] Adult PT Treatment:                                                DATE: 10/25/23 Therapeutic Exercise: Toe scrunches  Toe Yoga  Plantar fascia stretch Gastroc stretch towel  Soleus stretch with towel  4 way ankle with red /green therabands- cueing for proper technique Seated bilateral Heel raise - towel rolled under forefoot Seated Right Heel raise 15# - towel rolled under foot 2x10 Self Care: Management of exs so pt only experiences a reasonable increase in pain Options for orthotics To try hiking boots for arch support to see if they minimize foot pain Modalities: Frozen water  bottle massage to plantar foot x15 mins  [facility] Adult PT Treatment:                                                DATE: 10/20/23 Therapeutic Exercise: Toe scrunches  Toe Yoga  Plantar fascia stretch Gastroc stretch towel  Soleus stretch with towel  4 way ankle with red /green therabands Seated bilateral Heel raise - towel rolled under forefoot Seated Right Heel raise 15# - towel rolled under foot Manual Therapy  STW plantar fascia   [facility] Adult PT Treatment:                                                DATE: 10/17/23 Therapeutic Exercise: Toe scrunches  Toe Yoga  Plantar fascia stretch Gastroc stretch towel  Soleus stretch with towel   4 way ankle with red /green therabands=HEP Manual Therapy: Athletic tape used for heel pad taping  Self Care: Use of Ice massage  Consider using can boot per MD option  PATIENT EDUCATION:  Education details: Eval findings, POC, HEP, self care  Person educated: Patient Education method: Explanation, Demonstration, Tactile cues, Verbal cues, and Handouts Education comprehension: verbalized understanding, returned demonstration, verbal cues required, and tactile cues required  HOME EXERCISE PROGRAM: Access Code: [email or web address] URL: [email or web address] Date: 10/25/2023 Prepared by: Dasie Daft  Exercises - Gastroc Stretch on Wall (Mirrored)  - 1 x daily - 7 x weekly - 1 sets - 2-3 reps - 60 hold - Long Sitting Calf Stretch with Strap  - 1 x daily - 7 x weekly - 1 sets - 2-3 reps - 60 hold - Standing Heel Raises  - 1 x daily - 7 x weekly - 2 sets - 5 reps - 10 hold - Seated Ankle Eversion with Resistance  - 1 x daily - 7 x weekly - 3 sets - 10 reps - Seated Ankle Inversion with Resistance  - 1 x daily - 7 x weekly - 3 sets - 10 reps - Seated Ankle Plantar Flexion with Resistance Loop  - 1 x daily - 7 x weekly - 3 sets - 10 reps - Long Sitting Plantar Fascia Stretch with Towel  - 1 x daily - 7 x weekly - 1 sets - 3 reps - 30 hold - Toe Yoga - Alternating Great Toe and Lesser Toe Extension  - 1 x daily - 7 x weekly -  1 sets - 10 reps - Seated Ankle Dorsiflexion with Resistance  - 1 x daily - 7 x weekly - 3 sets - 10 reps - Towel Scrunches  - 1 x daily - 7 x weekly - 1 sets - 1 reps - 30 hold  ASSESSMENT:  CLINICAL IMPRESSION: Provided pt education re: reasonable increase in R foot/heel pain with her HEP. Discussed option ofr orthotics and the use of hiking boots as a option for arch support and to minimize strain with rocker style bottom. Continue PT for R foot and ankle strengthening c cueing provided for 4 way theraband exs. Pt returned proper demonstration for these exs.  Frozen water  bottle massage was completed at the end of session for symptom management. Pt tolerated PT today without adverse effects.   EVAL: Patient is a 68 y.o. female who was seen today for physical therapy evaluation and treatment for M72.2 (ICD-10-CM) - Plantar fascial fibromatosis. Pt presents with significant R plantar heel pain which is having a marked impact on her functional mobility and QOL. Pt is currently, using a SPC to reduce wt bearing through her R heel. Pt was initiated on a HEP and after completion today reported good pain relief, and she demonstrated an improved gait. Pt will benefit from skilled PT 2w6 to address impairments to optimize function with less pain.   OBJECTIVE IMPAIRMENTS: decreased activity tolerance, difficulty walking, increased edema, and pain.   ACTIVITY LIMITATIONS: carrying, sitting, standing, stairs, and locomotion level  PARTICIPATION LIMITATIONS: meal prep, cleaning, laundry, driving, shopping, community activity, and occupation  PERSONAL FACTORS: Past/current experiences and Time since onset of injury/illness/exacerbation are also affecting patient's functional outcome.   REHAB POTENTIAL: Good  CLINICAL DECISION MAKING: Evolving/moderate complexity  EVALUATION COMPLEXITY: Moderate   GOALS:  SHORT TERM GOALS: Target date: 10/23/23 Pt will be Ind in an initial HEP  Baseline: started Goal status: INITIAL  2.  Pt will report 25% improvement in R plantar heel pain for improved function and quality of life Baseline: 2-10/10 Goal status: INITIAL  LONG TERM GOALS: Target date: 11/18/23  Pt will be Ind in a final HEP to maintain achieved LOF  Baseline:  Goal status: INITIAL  2.  2.  Pt will report 75% improvement in R plantar heel pain for improved function and quality of life Baseline: 2-10/10 Goal status: INITIAL  3.  Pt will walk with a normalized gait pattern s an AD with increased pace and step length Baseline: c a SPC, antalgic, slow  pace Goal status: INITIAL  4.  Pt will be able to initiate a walking program being able to walk 20 mins with no more than a 2 point increase in her R plantar heel pain Baseline:  Goal status: INITIAL  5.  Pt's LEFS score will improve to 50% or greater as indication of improved function  Baseline: 9% Goal status: INITIAL  PLAN:  PT FREQUENCY: 2x/week  PT DURATION: 6 weeks  PLANNED INTERVENTIONS: 97164- PT Re-evaluation, 97110-Therapeutic exercises, 97530- Therapeutic activity, 97112- Neuromuscular re-education, 97535- Self Care, [id number]- Manual therapy, [id number]- Gait training, [phone]- Aquatic Therapy, [phone]- Electrical stimulation (unattended), [phone]- Ionotophoresis 4mg /ml Dexamethasone , 79439 (1-2 muscles), [id number] (3+ muscles)- Dry Needling, Patient/Family education, Balance training, Stair training, Taping, Joint mobilization, Cryotherapy, and Moist heat  PLAN FOR NEXT SESSION: Assess response to HEP; progress therex as indicated; use of modalities, manual therapy; and TPDN as indicated.   Kasarah Sitts MS, PT 10/25/23 6:13 PM

## 2023-10-25 ENCOUNTER — Ambulatory Visit

## 2023-10-25 DIAGNOSIS — M79671 Pain in right foot: Secondary | ICD-10-CM

## 2023-10-25 DIAGNOSIS — R262 Difficulty in walking, not elsewhere classified: Secondary | ICD-10-CM

## 2023-10-26 NOTE — Therapy (Signed)
 OUTPATIENT PHYSICAL THERAPY TREATMENT NOTE   Patient Name: Shelby Grant MRN: [id number] DOB:1955-11-01, 68 y.o., female Today's Date: 10/28/2023  END OF SESSION:  PT End of Session - 10/27/23 1342     Visit Number 7    Number of Visits 13    Date for Recertification  11/18/23    Authorization Type [employer]    PT Start Time 1330    PT Stop Time 1410    PT Time Calculation (min) 40 min    Activity Tolerance Patient tolerated treatment well    Behavior During Therapy [facility] for tasks assessed/performed            Past Medical History:  Diagnosis Date   ALLERGIC RHINITIS 07/03/2008   Allergy    seasonal   ANXIETY 06/02/2007   Arthritis    BELL'S PALSY, LEFT 12/02/2008   Cancer (HCC) 01/16/2015   kidney   CELLULITIS, LEFT LEG 09/14/2008   CHEST PAIN 07/07/2009   Complication of anesthesia 08/28/2014   work up in recovery room with jaw locked open   CORNS AND CALLUSES 07/03/2008   Depression    FOOT PAIN 09/22/2009   History of kidney cancer    Hyperlipidemia    IRREGULAR MENSES 06/03/2008   Neoplasm    left renal   OCD (obsessive compulsive disorder)    Renal disorder    Stress fracture of ankle since Jun 29, 2014   left   Supraventricular tachycardia 06/02/2007   Atrial tachycardia with Wenckebach periodicity   Past Surgical History:  Procedure Laterality Date   APPENDECTOMY  20016   BLEPHAROPLASTY Bilateral 10/31/2017   COLONOSCOPY  2009   HIP ARTHROPLASTY Right 11/18/2017   LAPAROSCOPIC APPENDECTOMY N/A 08/28/2014   Procedure: APPENDECTOMY LAPAROSCOPIC;  Surgeon: Lynwood Pina, MD;  Location: [facility] OR;  Service: General;  Laterality: N/A;   ROBOTIC ASSITED PARTIAL NEPHRECTOMY Left 01/06/2015   Procedure: ROBOTIC ASSISTED PARTIAL NEPHRECTOMY;  Surgeon: Gretel Ferrara, MD;  Location: [facility] ORS;  Service: Urology;  Laterality: Left;   WISDOM TOOTH EXTRACTION     Patient Active Problem List   Diagnosis Date Noted   Rash 05/12/2023   Tinnitus of  both ears 12/26/2022   Sensorineural hearing loss, bilateral 12/26/2022   Impacted cerumen of both ears 12/26/2022   Chest pain 06/03/2022   Sprain of right wrist 03/11/2022   Sprain of left ankle 01/21/2022   Acute dysfunction of left eustachian tube 09/30/2021   Left hip pain 07/09/2021   Osteoporosis 09/22/2019   Loss of transverse plantar arch 12/04/2018   Renal cell cancer (HCC) 08/11/2018   Refusal of blood transfusions as patient is Jehovah's Witness 08/11/2018   HLD (hyperlipidemia) 08/11/2018   Encounter for screening for cervical cancer 03/29/2017   Preventative health care 03/29/2017   Colon cancer screening 12/04/2015   Open bite of right thumb without damage to nail 08/04/2015   Menopause syndrome 02/11/2011   Allergic rhinitis 07/03/2008   Supraventricular tachycardia (HCC) 06/02/2007   Generalized anxiety disorder 06/02/2007    PCP: Norleen Lynwood ORN, MD  REFERRING PROVIDER: Aniceto Eva Grebe, PA-C   REFERRING DIAG: M72.2 (ICD-10-CM) - Plantar fascial fibromatosis   THERAPY DIAG:  Pain in right foot  Difficulty in walking, not elsewhere classified  Rationale for Evaluation and Treatment: Rehabilitation  ONSET DATE: April  SUBJECTIVE:   SUBJECTIVE STATEMENT: Pt reports her L foot pain was low when she woke up this AM, but she needed to work, cleaning involving, going up/down steps and it  is hurting this afternoon. She notes hiking boots did not help her R foot pain as well as the walking boot.  EVAL: Pt is not aware of an overt precipitating incident, but it may have been related to a couple of days of standing on a ladder. Prednisone , icing, voltaren  have not seemed to help. Is currently using a SPC to decrease wt bearing on the R foot. Pt has found shoes with wide high heels have been the most comfortable shoes for her to wear. Even more so than her Hokas. Stretching has also helped some. She does not feel like it is getting better. Pt reports recently  spraining the R great toe which is getting better.  PERTINENT HISTORY: See above  PAIN:  Are you having pain? Yes: NPRS scale: 4/10 Pain location: Medial heel, ankle, plantar foor Pain description: sharp, throb Aggravating factors: Increased time on feet- standing and walking, driving Relieving factors: SPC  PRECAUTIONS: None  RED FLAGS: None   WEIGHT BEARING RESTRICTIONS: No  FALLS:  Has patient fallen in last 6 months? 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 Blain after the the MRI 10/10/23   OBJECTIVE:  Note: Objective measures were completed at Evaluation unless otherwise noted.  DIAGNOSTIC FINDINGS: None for the R heel  10/17/23: MR right ankle 10/10/2023 Impression:  Severe plantar fascitis of the medial cord at the calcaneal attachment, with low grade partial tearing of the medial half of the medial cord origin Mild posterior tib tenosynovitis Pre achilles fat pad edema, suggesting impingement. The achilles tendon is normal Posterior subtalar joint effusion, but without cartilage abnormality or other arthritic changes in this joint.   PATIENT SURVEYS:  LEFS: 7/80= 9%  COGNITION: Overall cognitive status: Within functional limits for tasks assessed     SENSATION: WFL  EDEMA:  R medial heel swelling   POSTURE: neutral arch  PALPATION: TTPto the R medial heel and plantar fascia  LOWER EXTREMITY ROM:  Active ROM Right eval Left eval  Hip flexion    Hip extension    Hip abduction    Hip adduction    Hip internal rotation    Hip external rotation    Knee flexion    Knee extension    Ankle  dorsiflexion 12 12  Ankle plantarflexion    Ankle inversion    Ankle eversion     (Blank rows = not tested)  LOWER EXTREMITY MMT:  WNLs for both LEs MMT Right eval Left eval  Hip flexion    Hip extension    Hip abduction    Hip adduction    Hip internal rotation    Hip external rotation    Knee flexion    Knee extension    Ankle dorsiflexion    Ankle plantarflexion    Ankle inversion    Ankle eversion     (Blank rows = not tested)   GAIT: Distance walked: 200' Assistive device utilized: Single point cane Level of assistance: Modified independence Comments: Antalgic gait pattern  TREATMENT DATE:  [facility] Adult PT Treatment:                                                DATE: 10/27/23 Therapeutic Exercise: Toe scrunches  Toe Yoga  Plantar fascia stretch Gastroc stretch towel  4 way ankle with red therabands- 2x10 Seated Right Heel raise 15# - towel rolled under foot 2x10 Modalities: Vaso 10 min 36d low pressure Vaso was discontinued after 10 mins due to arch pain  [facility] Adult PT Treatment:                                                DATE: 10/25/23 Therapeutic Exercise: Toe scrunches  Toe Yoga  Plantar fascia stretch Gastroc stretch towel  Soleus stretch with towel  4 way ankle with red /green therabands- cueing for proper technique Seated bilateral Heel raise - towel rolled under forefoot Seated Right Heel raise 15# - towel rolled under foot 2x10 Self Care: Management of exs so pt only experiences a reasonable increase in pain Options for orthotics To try hiking boots for arch support to see if they minimize foot pain Modalities: Frozen water  bottle massage to plantar foot x15 mins  [facility] Adult PT Treatment:                                                DATE: 10/20/23 Therapeutic Exercise: Toe scrunches  Toe Yoga  Plantar fascia stretch Gastroc  stretch towel  Soleus stretch with towel  4 way ankle with red /green therabands Seated bilateral Heel raise - towel rolled under forefoot Seated Right Heel raise 15# - towel rolled under foot Manual Therapy  STW plantar fascia   PATIENT EDUCATION:  Education details: Eval findings, POC, HEP, self care  Person educated: Patient Education method: Explanation, Demonstration, Tactile cues, Verbal cues, and Handouts Education comprehension: verbalized understanding, returned demonstration, verbal cues required, and tactile cues required  HOME EXERCISE PROGRAM: Access Code: [email or web address] URL: [email or web address] Date: 10/25/2023 Prepared by: Dasie Daft  Exercises - Gastroc Stretch on Wall (Mirrored)  - 1 x daily - 7 x weekly - 1 sets - 2-3 reps - 60 hold - Long Sitting Calf Stretch with Strap  - 1 x daily - 7 x weekly - 1 sets - 2-3 reps - 60 hold - Standing Heel Raises  - 1 x daily - 7 x weekly - 2 sets - 5 reps - 10 hold - Seated Ankle Eversion with Resistance  - 1 x daily - 7 x weekly - 3 sets - 10 reps - Seated Ankle Inversion with Resistance  - 1 x daily - 7 x weekly - 3 sets - 10 reps - Seated Ankle Plantar Flexion with Resistance Loop  - 1 x daily - 7 x weekly - 3 sets - 10 reps - Long Sitting Plantar Fascia Stretch with Towel  - 1 x daily - 7 x weekly - 1 sets - 3 reps - 30 hold - Toe Yoga - Alternating Great Toe and Lesser Toe Extension  - 1 x daily - 7 x weekly - 1  sets - 10 reps - Seated Ankle Dorsiflexion with Resistance  - 1 x daily - 7 x weekly - 3 sets - 10 reps - Towel Scrunches  - 1 x daily - 7 x weekly - 1 sets - 1 reps - 30 hold  ASSESSMENT:  CLINICAL IMPRESSION: Use of hiking boots were not helpful with providing comfortable footwear. Pt did experience a decrease in pain, better than normal, this AM prior to having a day of significant physical stress re: time on feet/walking/steps. PT was continued for flexibility and strengthening exs for the R ankle  and foot. Vaso was completed at the [facility] for symptom management, however pt reported increase arch pain after 10 mins and vaso was discontinued. Pt will continue to benefit from skilled PT to address impairments for improved foot function with minimized pain.   EVAL: Patient is a 68 y.o. female who was seen today for physical therapy evaluation and treatment for M72.2 (ICD-10-CM) - Plantar fascial fibromatosis. Pt presents with significant R plantar heel pain which is having a marked impact on her functional mobility and QOL. Pt is currently, using a SPC to reduce wt bearing through her R heel. Pt was initiated on a HEP and after completion today reported good pain relief, and she demonstrated an improved gait. Pt will benefit from skilled PT 2w6 to address impairments to optimize function with less pain.   OBJECTIVE IMPAIRMENTS: decreased activity tolerance, difficulty walking, increased edema, and pain.   ACTIVITY LIMITATIONS: carrying, sitting, standing, stairs, and locomotion level  PARTICIPATION LIMITATIONS: meal prep, cleaning, laundry, driving, shopping, community activity, and occupation  PERSONAL FACTORS: Past/current experiences and Time since onset of injury/illness/exacerbation are also affecting patient's functional outcome.   REHAB POTENTIAL: Good  CLINICAL DECISION MAKING: Evolving/moderate complexity  EVALUATION COMPLEXITY: Moderate   GOALS:  SHORT TERM GOALS: Target date: 10/23/23 Pt will be Ind in an initial HEP  Baseline: started Goal status: MET  2.  Pt will report 25% improvement in R plantar heel pain for improved function and quality of life Baseline: 2-10/10 Goal status: NOT MET  LONG TERM GOALS: Target date: 11/18/23  Pt will be Ind in a final HEP to maintain achieved LOF  Baseline:  Goal status: INITIAL  2.  2.  Pt will report 75% improvement in R plantar heel pain for improved function and quality of life Baseline: 2-10/10 Goal status: INITIAL  3.  Pt  will walk with a normalized gait pattern s an AD with increased pace and step length Baseline: c a SPC, antalgic, slow pace Goal status: INITIAL  4.  Pt will be able to initiate a walking program being able to walk 20 mins with no more than a 2 point increase in her R plantar heel pain Baseline:  Goal status: INITIAL  5.  Pt's LEFS score will improve to 50% or greater as indication of improved function  Baseline: 9% Goal status: INITIAL  PLAN:  PT FREQUENCY: 2x/week  PT DURATION: 6 weeks  PLANNED INTERVENTIONS: 97164- PT Re-evaluation, 97110-Therapeutic exercises, 97530- Therapeutic activity, 97112- Neuromuscular re-education, 97535- Self Care, [id number]- Manual therapy, [id number]- Gait training, [phone]- Aquatic Therapy, [phone]- Electrical stimulation (unattended), [phone]- Ionotophoresis 4mg /ml Dexamethasone , 79439 (1-2 muscles), 20561 (3+ muscles)- Dry Needling, Patient/Family education, Balance training, Stair training, Taping, Joint mobilization, Cryotherapy, and Moist heat  PLAN FOR NEXT SESSION: Assess response to HEP; progress therex as indicated; use of modalities, manual therapy; and TPDN as indicated.   Ashyia Schraeder MS, PT 10/28/23 5:45 AM

## 2023-10-27 ENCOUNTER — Ambulatory Visit

## 2023-10-27 DIAGNOSIS — M79671 Pain in right foot: Secondary | ICD-10-CM

## 2023-10-27 DIAGNOSIS — R262 Difficulty in walking, not elsewhere classified: Secondary | ICD-10-CM

## 2023-11-01 ENCOUNTER — Encounter: Payer: Self-pay | Admitting: Physical Therapy

## 2023-11-01 ENCOUNTER — Ambulatory Visit

## 2023-11-01 ENCOUNTER — Ambulatory Visit: Admitting: Physical Therapy

## 2023-11-01 DIAGNOSIS — R262 Difficulty in walking, not elsewhere classified: Secondary | ICD-10-CM

## 2023-11-01 DIAGNOSIS — M79671 Pain in right foot: Secondary | ICD-10-CM

## 2023-11-01 NOTE — Therapy (Signed)
 OUTPATIENT PHYSICAL THERAPY TREATMENT NOTE   Patient Name: BERTIE MCCONATHY MRN: [id number] DOB:August 30, 1955, 68 y.o., female Today's Date: 11/01/2023  END OF SESSION:  PT End of Session - 11/01/23 1413     Visit Number 8    Number of Visits 13    Date for Recertification  11/18/23    Authorization Type [employer]    PT Start Time 1415    PT Stop Time 1456    PT Time Calculation (min) 41 min    Activity Tolerance Patient tolerated treatment well    Behavior During Therapy [facility] for tasks assessed/performed            Past Medical History:  Diagnosis Date   ALLERGIC RHINITIS 07/03/2008   Allergy    seasonal   ANXIETY 06/02/2007   Arthritis    BELL'S PALSY, LEFT 12/02/2008   Cancer (HCC) 01/16/2015   kidney   CELLULITIS, LEFT LEG 09/14/2008   CHEST PAIN 07/07/2009   Complication of anesthesia 08/28/2014   work up in recovery room with jaw locked open   CORNS AND CALLUSES 07/03/2008   Depression    FOOT PAIN 09/22/2009   History of kidney cancer    Hyperlipidemia    IRREGULAR MENSES 06/03/2008   Neoplasm    left renal   OCD (obsessive compulsive disorder)    Renal disorder    Stress fracture of ankle since Jun 29, 2014   left   Supraventricular tachycardia 06/02/2007   Atrial tachycardia with Wenckebach periodicity   Past Surgical History:  Procedure Laterality Date   APPENDECTOMY  20016   BLEPHAROPLASTY Bilateral 10/31/2017   COLONOSCOPY  2009   HIP ARTHROPLASTY Right 11/18/2017   LAPAROSCOPIC APPENDECTOMY N/A 08/28/2014   Procedure: APPENDECTOMY LAPAROSCOPIC;  Surgeon: Lynwood Pina, MD;  Location: [facility] OR;  Service: General;  Laterality: N/A;   ROBOTIC ASSITED PARTIAL NEPHRECTOMY Left 01/06/2015   Procedure: ROBOTIC ASSISTED PARTIAL NEPHRECTOMY;  Surgeon: Gretel Ferrara, MD;  Location: [facility] ORS;  Service: Urology;  Laterality: Left;   WISDOM TOOTH EXTRACTION     Patient Active Problem List   Diagnosis Date Noted   Rash 05/12/2023   Tinnitus of  both ears 12/26/2022   Sensorineural hearing loss, bilateral 12/26/2022   Impacted cerumen of both ears 12/26/2022   Chest pain 06/03/2022   Sprain of right wrist 03/11/2022   Sprain of left ankle 01/21/2022   Acute dysfunction of left eustachian tube 09/30/2021   Left hip pain 07/09/2021   Osteoporosis 09/22/2019   Loss of transverse plantar arch 12/04/2018   Renal cell cancer (HCC) 08/11/2018   Refusal of blood transfusions as patient is Jehovah's Witness 08/11/2018   HLD (hyperlipidemia) 08/11/2018   Encounter for screening for cervical cancer 03/29/2017   Preventative health care 03/29/2017   Colon cancer screening 12/04/2015   Open bite of right thumb without damage to nail 08/04/2015   Menopause syndrome 02/11/2011   Allergic rhinitis 07/03/2008   Supraventricular tachycardia (HCC) 06/02/2007   Generalized anxiety disorder 06/02/2007    PCP: Norleen Lynwood ORN, MD  REFERRING PROVIDER: Aniceto Eva Grebe, PA-C   REFERRING DIAG: M72.2 (ICD-10-CM) - Plantar fascial fibromatosis   THERAPY DIAG:  Pain in right foot  Difficulty in walking, not elsewhere classified  Rationale for Evaluation and Treatment: Rehabilitation  ONSET DATE: April  SUBJECTIVE:   SUBJECTIVE STATEMENT: Pt reports that her low back is hurting because she picked up a box awkwardly when moving.  Pt reports that she does have some relief  from the boot.  EVAL: Pt is not aware of an overt precipitating incident, but it may have been related to a couple of days of standing on a ladder. Prednisone , icing, voltaren  have not seemed to help. Is currently using a SPC to decrease wt bearing on the R foot. Pt has found shoes with wide high heels have been the most comfortable shoes for her to wear. Even more so than her Hokas. Stretching has also helped some. She does not feel like it is getting better. Pt reports recently spraining the R great toe which is getting better.  PERTINENT HISTORY: See above  PAIN:  Are  you having pain? Yes: NPRS scale: 4/10 Pain location: Medial heel, ankle, plantar foor Pain description: sharp, throb Aggravating factors: Increased time on feet- standing and walking, driving Relieving factors: SPC  PRECAUTIONS: None  RED FLAGS: None   WEIGHT BEARING RESTRICTIONS: No  FALLS:  Has patient fallen in last 6 months? 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 Blain after the the MRI 10/10/23   OBJECTIVE:  Note: Objective measures were completed at Evaluation unless otherwise noted.  DIAGNOSTIC FINDINGS: None for the R heel  10/17/23: MR right ankle 10/10/2023 Impression:  Severe plantar fascitis of the medial cord at the calcaneal attachment, with low grade partial tearing of the medial half of the medial cord origin Mild posterior tib tenosynovitis Pre achilles fat pad edema, suggesting impingement. The achilles tendon is normal Posterior subtalar joint effusion, but without cartilage abnormality or other arthritic changes in this joint.   PATIENT SURVEYS:  LEFS: 7/80= 9%  COGNITION: Overall cognitive status: Within functional limits for tasks assessed     SENSATION: WFL  EDEMA:  R medial heel swelling   POSTURE: neutral arch  PALPATION: TTPto the R medial heel and plantar fascia  LOWER EXTREMITY ROM:  Active ROM Right eval Left eval  Hip flexion    Hip extension    Hip abduction    Hip adduction    Hip internal rotation    Hip external rotation    Knee flexion    Knee extension    Ankle dorsiflexion 12 12  Ankle plantarflexion    Ankle inversion    Ankle eversion     (Blank rows  = not tested)  LOWER EXTREMITY MMT:  WNLs for both LEs MMT Right eval Left eval  Hip flexion    Hip extension    Hip abduction    Hip adduction    Hip internal rotation    Hip external rotation    Knee flexion    Knee extension    Ankle dorsiflexion    Ankle plantarflexion    Ankle inversion    Ankle eversion     (Blank rows = not tested)   GAIT: Distance walked: 200' Assistive device utilized: Single point cane Level of assistance: Modified independence Comments: Antalgic gait pattern  TREATMENT DATE:  [facility] Adult PT Treatment:                                                DATE: 11/01/23 Therapeutic Exercise: Toe scrunches  Toe Yoga  Gastroc/PF w/ stretch towel  4 way ankle with green therabands- 2x10 Seated Right Heel raise 15# - towel rolled under foot 2x10 25# - 2x10  Manual Therapy  STM PF w/ and w/o GT ext  [facility] Adult PT Treatment:                                                DATE: 10/25/23 Therapeutic Exercise: Toe scrunches  Toe Yoga  Plantar fascia stretch Gastroc stretch towel  Soleus stretch with towel  4 way ankle with red /green therabands- cueing for proper technique Seated bilateral Heel raise - towel rolled under forefoot Seated Right Heel raise 15# - towel rolled under foot 2x10 Self Care: Management of exs so pt only experiences a reasonable increase in pain Options for orthotics To try hiking boots for arch support to see if they minimize foot pain Modalities: Frozen water  bottle massage to plantar foot x15 mins  [facility] Adult PT Treatment:                                                DATE: 10/20/23 Therapeutic Exercise: Toe scrunches  Toe Yoga  Plantar fascia stretch Gastroc stretch towel  Soleus stretch with towel  4 way ankle with red /green therabands Seated bilateral Heel raise - towel rolled under forefoot Seated Right  Heel raise 15# - towel rolled under foot Manual Therapy  STW plantar fascia   PATIENT EDUCATION:  Education details: Eval findings, POC, HEP, self care  Person educated: Patient Education method: Explanation, Demonstration, Tactile cues, Verbal cues, and Handouts Education comprehension: verbalized understanding, returned demonstration, verbal cues required, and tactile cues required  HOME EXERCISE PROGRAM: Access Code: [email or web address] URL: [email or web address] Date: 10/25/2023 Prepared by: Dasie Daft  Exercises - Gastroc Stretch on Wall (Mirrored)  - 1 x daily - 7 x weekly - 1 sets - 2-3 reps - 60 hold - Long Sitting Calf Stretch with Strap  - 1 x daily - 7 x weekly - 1 sets - 2-3 reps - 60 hold - Standing Heel Raises  - 1 x daily - 7 x weekly - 2 sets - 5 reps - 10 hold - Seated Ankle Eversion with Resistance  - 1 x daily - 7 x weekly - 3 sets - 10 reps - Seated Ankle Inversion with Resistance  - 1 x daily - 7 x weekly - 3 sets - 10 reps - Seated Ankle Plantar Flexion with Resistance Loop  - 1 x daily - 7 x weekly - 3 sets - 10 reps - Long Sitting Plantar Fascia Stretch with Towel  - 1 x daily - 7 x weekly - 1 sets - 3 reps - 30 hold - Toe Yoga - Alternating Great Toe and Lesser Toe Extension  - 1 x daily - 7 x weekly - 1 sets - 10 reps -  Seated Ankle Dorsiflexion with Resistance  - 1 x daily - 7 x weekly - 3 sets - 10 reps - Towel Scrunches  - 1 x daily - 7 x weekly - 1 sets - 1 reps - 30 hold  ASSESSMENT:  CLINICAL IMPRESSION: Pt with significant low back pain following lifting injury while lifting causing antalgic gait.  Continued current exercises with added load today with no increase in pain.  Pt reports reduction in sxs following manual.  EVAL: Patient is a 68 y.o. female who was seen today for physical therapy evaluation and treatment for M72.2 (ICD-10-CM) - Plantar fascial fibromatosis. Pt presents with significant R plantar heel pain which is having a marked  impact on her functional mobility and QOL. Pt is currently, using a SPC to reduce wt bearing through her R heel. Pt was initiated on a HEP and after completion today reported good pain relief, and she demonstrated an improved gait. Pt will benefit from skilled PT 2w6 to address impairments to optimize function with less pain.   OBJECTIVE IMPAIRMENTS: decreased activity tolerance, difficulty walking, increased edema, and pain.   ACTIVITY LIMITATIONS: carrying, sitting, standing, stairs, and locomotion level  PARTICIPATION LIMITATIONS: meal prep, cleaning, laundry, driving, shopping, community activity, and occupation  PERSONAL FACTORS: Past/current experiences and Time since onset of injury/illness/exacerbation are also affecting patient's functional outcome.   REHAB POTENTIAL: Good  CLINICAL DECISION MAKING: Evolving/moderate complexity  EVALUATION COMPLEXITY: Moderate   GOALS:  SHORT TERM GOALS: Target date: 10/23/23 Pt will be Ind in an initial HEP  Baseline: started Goal status: MET  2.  Pt will report 25% improvement in R plantar heel pain for improved function and quality of life Baseline: 2-10/10 Goal status: NOT MET  LONG TERM GOALS: Target date: 11/18/23  Pt will be Ind in a final HEP to maintain achieved LOF  Baseline:  Goal status: INITIAL  2.  2.  Pt will report 75% improvement in R plantar heel pain for improved function and quality of life Baseline: 2-10/10 Goal status: INITIAL  3.  Pt will walk with a normalized gait pattern s an AD with increased pace and step length Baseline: c a SPC, antalgic, slow pace Goal status: INITIAL  4.  Pt will be able to initiate a walking program being able to walk 20 mins with no more than a 2 point increase in her R plantar heel pain Baseline:  Goal status: INITIAL  5.  Pt's LEFS score will improve to 50% or greater as indication of improved function  Baseline: 9% Goal status: INITIAL  PLAN:  PT FREQUENCY: 2x/week  PT  DURATION: 6 weeks  PLANNED INTERVENTIONS: 97164- PT Re-evaluation, 97110-Therapeutic exercises, 97530- Therapeutic activity, 97112- Neuromuscular re-education, 97535- Self Care, [id number]- Manual therapy, [id number]- Gait training, [phone]- Aquatic Therapy, [phone]- Electrical stimulation (unattended), [phone]- Ionotophoresis 4mg /ml Dexamethasone , 79439 (1-2 muscles), 20561 (3+ muscles)- Dry Needling, Patient/Family education, Balance training, Stair training, Taping, Joint mobilization, Cryotherapy, and Moist heat  PLAN FOR NEXT SESSION: Assess response to HEP; progress therex as indicated; use of modalities, manual therapy; and TPDN as indicated.   Willma Obando E Caleyah Jr PT 11/01/23 3:02 PM

## 2023-11-03 ENCOUNTER — Encounter: Payer: Self-pay | Admitting: Physical Therapy

## 2023-11-03 ENCOUNTER — Ambulatory Visit: Attending: Student | Admitting: Physical Therapy

## 2023-11-03 DIAGNOSIS — R262 Difficulty in walking, not elsewhere classified: Secondary | ICD-10-CM | POA: Diagnosis not present

## 2023-11-03 DIAGNOSIS — M79671 Pain in right foot: Secondary | ICD-10-CM | POA: Diagnosis not present

## 2023-11-03 NOTE — Therapy (Signed)
 OUTPATIENT PHYSICAL THERAPY TREATMENT NOTE   Patient Name: Shelby Grant MRN: [id number] DOB:06-17-1955, 68 y.o., female Today's Date: 11/03/2023  END OF SESSION:  PT End of Session - 11/03/23 1448     Visit Number 9    Number of Visits 13    Date for Recertification  11/18/23    Authorization Type [employer]    PT Start Time 0245    PT Stop Time 0330    PT Time Calculation (min) 45 min            Past Medical History:  Diagnosis Date   ALLERGIC RHINITIS 07/03/2008   Allergy    seasonal   ANXIETY 06/02/2007   Arthritis    BELL'S PALSY, LEFT 12/02/2008   Cancer (HCC) 01/16/2015   kidney   CELLULITIS, LEFT LEG 09/14/2008   CHEST PAIN 07/07/2009   Complication of anesthesia 08/28/2014   work up in recovery room with jaw locked open   CORNS AND CALLUSES 07/03/2008   Depression    FOOT PAIN 09/22/2009   History of kidney cancer    Hyperlipidemia    IRREGULAR MENSES 06/03/2008   Neoplasm    left renal   OCD (obsessive compulsive disorder)    Renal disorder    Stress fracture of ankle since Jun 29, 2014   left   Supraventricular tachycardia 06/02/2007   Atrial tachycardia with Wenckebach periodicity   Past Surgical History:  Procedure Laterality Date   APPENDECTOMY  20016   BLEPHAROPLASTY Bilateral 10/31/2017   COLONOSCOPY  2009   HIP ARTHROPLASTY Right 11/18/2017   LAPAROSCOPIC APPENDECTOMY N/A 08/28/2014   Procedure: APPENDECTOMY LAPAROSCOPIC;  Surgeon: Lynwood Pina, MD;  Location: [facility] OR;  Service: General;  Laterality: N/A;   ROBOTIC ASSITED PARTIAL NEPHRECTOMY Left 01/06/2015   Procedure: ROBOTIC ASSISTED PARTIAL NEPHRECTOMY;  Surgeon: Gretel Ferrara, MD;  Location: [facility];  Service: Urology;  Laterality: Left;   WISDOM TOOTH EXTRACTION     Patient Active Problem List   Diagnosis Date Noted   Rash 05/12/2023   Tinnitus of both ears 12/26/2022   Sensorineural hearing loss, bilateral 12/26/2022   Impacted cerumen of both ears 12/26/2022    Chest pain 06/03/2022   Sprain of right wrist 03/11/2022   Sprain of left ankle 01/21/2022   Acute dysfunction of left eustachian tube 09/30/2021   Left hip pain 07/09/2021   Osteoporosis 09/22/2019   Loss of transverse plantar arch 12/04/2018   Renal cell cancer (HCC) 08/11/2018   Refusal of blood transfusions as patient is Jehovah's Witness 08/11/2018   HLD (hyperlipidemia) 08/11/2018   Encounter for screening for cervical cancer 03/29/2017   Preventative health care 03/29/2017   Colon cancer screening 12/04/2015   Open bite of right thumb without damage to nail 08/04/2015   Menopause syndrome 02/11/2011   Allergic rhinitis 07/03/2008   Supraventricular tachycardia (HCC) 06/02/2007   Generalized anxiety disorder 06/02/2007    PCP: Norleen Lynwood ORN, MD  REFERRING PROVIDER: Aniceto Eva Grebe, PA-C   REFERRING DIAG: M72.2 (ICD-10-CM) - Plantar fascial fibromatosis   THERAPY DIAG:  Pain in right foot  Difficulty in walking, not elsewhere classified  Rationale for Evaluation and Treatment: Rehabilitation  ONSET DATE: April  SUBJECTIVE:   SUBJECTIVE STATEMENT: Pt reports chiropractor recommended night boot and slant board. Not wearing boot today. Pain 4/10. Still moving homes. Pain 8/10 earlier today.   EVAL: Pt is not aware of an overt precipitating incident, but it may have been related to a couple of days  of standing on a ladder. Prednisone , icing, voltaren  have not seemed to help. Is currently using a SPC to decrease wt bearing on the R foot. Pt has found shoes with wide high heels have been the most comfortable shoes for her to wear. Even more so than her Hokas. Stretching has also helped some. She does not feel like it is getting better. Pt reports recently spraining the R great toe which is getting better.  PERTINENT HISTORY: See above  PAIN:  Are you having pain? Yes: NPRS scale: 4/10 Pain location: Medial heel, ankle, plantar foor Pain description: sharp,  throb Aggravating factors: Increased time on feet- standing and walking, driving Relieving factors: SPC  PRECAUTIONS: None  RED FLAGS: None   WEIGHT BEARING RESTRICTIONS: No  FALLS:  Has patient fallen in last 6 months? 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 Blain after the the MRI 10/10/23   OBJECTIVE:  Note: Objective measures were completed at Evaluation unless otherwise noted.  DIAGNOSTIC FINDINGS: None for the R heel  10/17/23: MR right ankle 10/10/2023 Impression:  Severe plantar fascitis of the medial cord at the calcaneal attachment, with low grade partial tearing of the medial half of the medial cord origin Mild posterior tib tenosynovitis Pre achilles fat pad edema, suggesting impingement. The achilles tendon is normal Posterior subtalar joint effusion, but without cartilage abnormality or other arthritic changes in this joint.   PATIENT SURVEYS:  LEFS: 7/80= 9%  COGNITION: Overall cognitive status: Within functional limits for tasks assessed     SENSATION: WFL  EDEMA:  R medial heel swelling   POSTURE: neutral arch  PALPATION: TTPto the R medial heel and plantar fascia  LOWER EXTREMITY ROM:  Active ROM Right eval Left eval  Hip flexion    Hip extension    Hip abduction    Hip adduction    Hip internal rotation    Hip external rotation    Knee flexion    Knee extension    Ankle dorsiflexion 12 12  Ankle plantarflexion    Ankle inversion    Ankle eversion     (Blank rows = not tested)  LOWER EXTREMITY MMT:  WNLs for both LEs MMT Right eval Left eval  Hip flexion    Hip  extension    Hip abduction    Hip adduction    Hip internal rotation    Hip external rotation    Knee flexion    Knee extension    Ankle dorsiflexion    Ankle plantarflexion    Ankle inversion    Ankle eversion     (Blank rows = not tested)   GAIT: Distance walked: 200' Assistive device utilized: Single point cane Level of assistance: Modified independence Comments: Antalgic gait pattern  TREATMENT DATE:  [facility] Adult PT Treatment:                                                DATE: 11/03/23 Therapeutic Exercise: Iso heel raise 30sec x 5  Gastroc stretch 30 sec x 2  Soleus stretch 30 sec x 2  Seated toe scrunch 5 sec holds PF stretch with towel Long sitting  4 way ankle GTB x 20 each  Manual Therapy  STM PF w/ and w/o GT ext    [facility] Adult PT Treatment:                                                DATE: 11/01/23 Therapeutic Exercise: Toe scrunches  Toe Yoga  Gastroc/PF w/ stretch towel  4 way ankle with green therabands- 2x10 Seated Right Heel raise 15# - towel rolled under foot 2x10 25# - 2x10  Manual Therapy  STM PF w/ and w/o GT ext  [facility] Adult PT Treatment:                                                DATE: 10/25/23 Therapeutic Exercise: Toe scrunches  Toe Yoga  Plantar fascia stretch Gastroc stretch towel  Soleus stretch with towel  4 way ankle with red /green therabands- cueing for proper technique Seated bilateral Heel raise - towel rolled under forefoot Seated Right Heel raise 15# - towel rolled under foot 2x10 Self Care: Management of exs so pt only experiences a reasonable increase in pain Options for orthotics To try hiking boots for arch support to see if they minimize foot pain Modalities: Frozen water  bottle massage to plantar foot x15 mins  [facility] Adult PT Treatment:                                                DATE:  10/20/23 Therapeutic Exercise: Toe scrunches  Toe Yoga  Plantar fascia stretch Gastroc stretch towel  Soleus stretch with towel  4 way ankle with red /green therabands Seated bilateral Heel raise - towel rolled under forefoot Seated Right Heel raise 15# - towel rolled under foot Manual Therapy  STW plantar fascia   PATIENT EDUCATION:  Education details: Eval findings, POC, HEP, self care  Person educated: Patient Education method: Explanation, Demonstration, Tactile cues, Verbal cues, and Handouts Education comprehension: verbalized understanding, returned demonstration, verbal cues required, and tactile cues required  HOME EXERCISE PROGRAM: Access Code: [email or web address] URL: [email or web address] Date: 10/25/2023 Prepared by: Dasie Daft  Exercises - Gastroc Stretch on Wall (Mirrored)  - 1 x daily - 7 x weekly - 1 sets - 2-3 reps - 60 hold - Long Sitting Calf Stretch with Strap  - 1 x daily - 7 x weekly - 1 sets - 2-3 reps - 60 hold - Standing Heel Raises  - 1 x daily - 7 x weekly - 2 sets - 5 reps - 10 hold - Seated Ankle Eversion with Resistance  -  1 x daily - 7 x weekly - 3 sets - 10 reps - Seated Ankle Inversion with Resistance  - 1 x daily - 7 x weekly - 3 sets - 10 reps - Seated Ankle Plantar Flexion with Resistance Loop  - 1 x daily - 7 x weekly - 3 sets - 10 reps - Long Sitting Plantar Fascia Stretch with Towel  - 1 x daily - 7 x weekly - 1 sets - 3 reps - 30 hold - Toe Yoga - Alternating Great Toe and Lesser Toe Extension  - 1 x daily - 7 x weekly - 1 sets - 10 reps - Seated Ankle Dorsiflexion with Resistance  - 1 x daily - 7 x weekly - 3 sets - 10 reps - Towel Scrunches  - 1 x daily - 7 x weekly - 1 sets - 1 reps - 30 hold  ASSESSMENT:  CLINICAL IMPRESSION: Pt saw chiropractor and back pain is improved. Progressed with closed chain isometric heel raises which she tolerated well. Continued Manual for Plantar fascia pain. She is not wearing cam boot today.    EVAL: Patient is a 68 y.o. female who was seen today for physical therapy evaluation and treatment for M72.2 (ICD-10-CM) - Plantar fascial fibromatosis. Pt presents with significant R plantar heel pain which is having a marked impact on her functional mobility and QOL. Pt is currently, using a SPC to reduce wt bearing through her R heel. Pt was initiated on a HEP and after completion today reported good pain relief, and she demonstrated an improved gait. Pt will benefit from skilled PT 2w6 to address impairments to optimize function with less pain.   OBJECTIVE IMPAIRMENTS: decreased activity tolerance, difficulty walking, increased edema, and pain.   ACTIVITY LIMITATIONS: carrying, sitting, standing, stairs, and locomotion level  PARTICIPATION LIMITATIONS: meal prep, cleaning, laundry, driving, shopping, community activity, and occupation  PERSONAL FACTORS: Past/current experiences and Time since onset of injury/illness/exacerbation are also affecting patient's functional outcome.   REHAB POTENTIAL: Good  CLINICAL DECISION MAKING: Evolving/moderate complexity  EVALUATION COMPLEXITY: Moderate   GOALS:  SHORT TERM GOALS: Target date: 10/23/23 Pt will be Ind in an initial HEP  Baseline: started Goal status: MET  2.  Pt will report 25% improvement in R plantar heel pain for improved function and quality of life Baseline: 2-10/10 Goal status: NOT MET  LONG TERM GOALS: Target date: 11/18/23  Pt will be Ind in a final HEP to maintain achieved LOF  Baseline:  Goal status: INITIAL  2.  2.  Pt will report 75% improvement in R plantar heel pain for improved function and quality of life Baseline: 2-10/10 Goal status: INITIAL  3.  Pt will walk with a normalized gait pattern s an AD with increased pace and step length Baseline: c a SPC, antalgic, slow pace Goal status: INITIAL  4.  Pt will be able to initiate a walking program being able to walk 20 mins with no more than a 2 point  increase in her R plantar heel pain Baseline:  Goal status: INITIAL  5.  Pt's LEFS score will improve to 50% or greater as indication of improved function  Baseline: 9% Goal status: INITIAL  PLAN:  PT FREQUENCY: 2x/week  PT DURATION: 6 weeks  PLANNED INTERVENTIONS: 97164- PT Re-evaluation, 97110-Therapeutic exercises, 97530- Therapeutic activity, [id number]- Neuromuscular re-education, 97535- Self Care, [id number]- Manual therapy, [id number]- Gait training, [phone]- Aquatic Therapy, [phone]- Electrical stimulation (unattended), 97033- Ionotophoresis 4mg /ml Dexamethasone , 79439 (1-2 muscles), 20561 (3+ muscles)- Dry  Needling, Patient/Family education, Balance training, Stair training, Taping, Joint mobilization, Cryotherapy, and Moist heat  PLAN FOR NEXT SESSION: Assess response to HEP; progress therex as indicated; use of modalities, manual therapy; and TPDN as indicated.   Harlene CHRISTELLA Persons PTA 11/03/23 3:43 PM

## 2023-11-08 ENCOUNTER — Encounter: Payer: Self-pay | Admitting: Physical Therapy

## 2023-11-08 ENCOUNTER — Ambulatory Visit: Admitting: Physical Therapy

## 2023-11-08 DIAGNOSIS — M79671 Pain in right foot: Secondary | ICD-10-CM | POA: Diagnosis not present

## 2023-11-08 DIAGNOSIS — R262 Difficulty in walking, not elsewhere classified: Secondary | ICD-10-CM

## 2023-11-08 NOTE — Therapy (Signed)
 OUTPATIENT PHYSICAL THERAPY TREATMENT NOTE   Patient Name: Shelby Grant MRN: [id number] DOB:1955-06-11, 68 y.o., female Today's Date: 11/08/2023  END OF SESSION:  PT End of Session - 11/08/23 1106     Visit Number 10    Number of Visits 13    Date for Recertification  11/18/23    Authorization Type [employer]    PT Start Time 1103    PT Stop Time 1145    PT Time Calculation (min) 42 min            Past Medical History:  Diagnosis Date   ALLERGIC RHINITIS 07/03/2008   Allergy    seasonal   ANXIETY 06/02/2007   Arthritis    BELL'S PALSY, LEFT 12/02/2008   Cancer (HCC) 01/16/2015   kidney   CELLULITIS, LEFT LEG 09/14/2008   CHEST PAIN 07/07/2009   Complication of anesthesia 08/28/2014   work up in recovery room with jaw locked open   CORNS AND CALLUSES 07/03/2008   Depression    FOOT PAIN 09/22/2009   History of kidney cancer    Hyperlipidemia    IRREGULAR MENSES 06/03/2008   Neoplasm    left renal   OCD (obsessive compulsive disorder)    Renal disorder    Stress fracture of ankle since Jun 29, 2014   left   Supraventricular tachycardia 06/02/2007   Atrial tachycardia with Wenckebach periodicity   Past Surgical History:  Procedure Laterality Date   APPENDECTOMY  20016   BLEPHAROPLASTY Bilateral 10/31/2017   COLONOSCOPY  2009   HIP ARTHROPLASTY Right 11/18/2017   LAPAROSCOPIC APPENDECTOMY N/A 08/28/2014   Procedure: APPENDECTOMY LAPAROSCOPIC;  Surgeon: Lynwood Pina, MD;  Location: [facility] OR;  Service: General;  Laterality: N/A;   ROBOTIC ASSITED PARTIAL NEPHRECTOMY Left 01/06/2015   Procedure: ROBOTIC ASSISTED PARTIAL NEPHRECTOMY;  Surgeon: Gretel Ferrara, MD;  Location: [facility];  Service: Urology;  Laterality: Left;   WISDOM TOOTH EXTRACTION     Patient Active Problem List   Diagnosis Date Noted   Rash 05/12/2023   Tinnitus of both ears 12/26/2022   Sensorineural hearing loss, bilateral 12/26/2022   Impacted cerumen of both ears 12/26/2022    Chest pain 06/03/2022   Sprain of right wrist 03/11/2022   Sprain of left ankle 01/21/2022   Acute dysfunction of left eustachian tube 09/30/2021   Left hip pain 07/09/2021   Osteoporosis 09/22/2019   Loss of transverse plantar arch 12/04/2018   Renal cell cancer (HCC) 08/11/2018   Refusal of blood transfusions as patient is Jehovah's Witness 08/11/2018   HLD (hyperlipidemia) 08/11/2018   Encounter for screening for cervical cancer 03/29/2017   Preventative health care 03/29/2017   Colon cancer screening 12/04/2015   Open bite of right thumb without damage to nail 08/04/2015   Menopause syndrome 02/11/2011   Allergic rhinitis 07/03/2008   Supraventricular tachycardia (HCC) 06/02/2007   Generalized anxiety disorder 06/02/2007    PCP: Norleen Lynwood ORN, MD  REFERRING PROVIDER: Aniceto Eva Grebe, PA-C   REFERRING DIAG: M72.2 (ICD-10-CM) - Plantar fascial fibromatosis   THERAPY DIAG:  Pain in right foot  Difficulty in walking, not elsewhere classified  Rationale for Evaluation and Treatment: Rehabilitation  ONSET DATE: April  SUBJECTIVE:   SUBJECTIVE STATEMENT: Wore the boot yesterday for 5 hours while moving. I am going to [employer] today. I will consider dry needling. I have not had an injection. I really do not want surgery.    EVAL: Pt is not aware of an overt precipitating  incident, but it may have been related to a couple of days of standing on a ladder. Prednisone , icing, voltaren  have not seemed to help. Is currently using a SPC to decrease wt bearing on the R foot. Pt has found shoes with wide high heels have been the most comfortable shoes for her to wear. Even more so than her Hokas. Stretching has also helped some. She does not feel like it is getting better. Pt reports recently spraining the R great toe which is getting better.  PERTINENT HISTORY: See above  PAIN:  Are you having pain? Yes: NPRS scale: 6/10 Pain location: Medial heel, ankle, plantar  foor Pain description: sharp, throb Aggravating factors: Increased time on feet- standing and walking, driving Relieving factors: SPC  PRECAUTIONS: None  RED FLAGS: None   WEIGHT BEARING RESTRICTIONS: No  FALLS:  Has patient fallen in last 6 months? 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 Blain after the the MRI 10/10/23   OBJECTIVE:  Note: Objective measures were completed at Evaluation unless otherwise noted.  DIAGNOSTIC FINDINGS: None for the R heel  10/17/23: MR right ankle 10/10/2023 Impression:  Severe plantar fascitis of the medial cord at the calcaneal attachment, with low grade partial tearing of the medial half of the medial cord origin Mild posterior tib tenosynovitis Pre achilles fat pad edema, suggesting impingement. The achilles tendon is normal Posterior subtalar joint effusion, but without cartilage abnormality or other arthritic changes in this joint.   PATIENT SURVEYS:  LEFS: 7/80= 9%  COGNITION: Overall cognitive status: Within functional limits for tasks assessed     SENSATION: WFL  EDEMA:  R medial heel swelling   POSTURE: neutral arch  PALPATION: TTPto the R medial heel and plantar fascia  LOWER EXTREMITY ROM:  Active ROM Right eval Left eval  Hip flexion    Hip extension    Hip abduction    Hip adduction    Hip internal rotation    Hip external rotation    Knee flexion    Knee extension    Ankle dorsiflexion 12 12  Ankle plantarflexion    Ankle inversion    Ankle eversion     (Blank rows = not tested)  LOWER EXTREMITY MMT:  WNLs for both LEs MMT Right eval  Left eval  Hip flexion    Hip extension    Hip abduction    Hip adduction    Hip internal rotation    Hip external rotation    Knee flexion    Knee extension    Ankle dorsiflexion    Ankle plantarflexion    Ankle inversion    Ankle eversion     (Blank rows = not tested)   GAIT: Distance walked: 200' Assistive device utilized: Single point cane Level of assistance: Modified independence Comments: Antalgic gait pattern  [facility] Adult PT Treatment:                                                DATE: 11/08/23 Therapeutic Exercise: Delmer  rocker Slant  board gastroc and soleus stretch  Iso heel raise 30 sec from slant board x 5 - mid range hold  Tandem stance RLE back, on AIREX pad  Plantar fascia stretch with towel  Seated toe scrunches  Toe Yoga  Manual Therapy: STW plantar fascia                                                                                                                 TREATMENT DATE:  [facility] Adult PT Treatment:                                                DATE: 11/03/23 Therapeutic Exercise: Iso heel raise 30sec x 5  Gastroc stretch 30 sec x 2  Soleus stretch 30 sec x 2  Seated toe scrunch 5 sec holds PF stretch with towel Long sitting  4 way ankle GTB x 20 each  Manual Therapy  STM PF w/ and w/o GT ext    [facility] Adult PT Treatment:                                                DATE: 11/01/23 Therapeutic Exercise: Toe scrunches  Toe Yoga  Gastroc/PF w/ stretch towel  4 way ankle with green therabands- 2x10 Seated Right Heel raise 15# - towel rolled under foot 2x10 25# - 2x10  Manual Therapy  STM PF w/ and w/o GT ext  [facility] Adult PT Treatment:                                                DATE: 10/25/23 Therapeutic Exercise: Toe scrunches  Toe Yoga  Plantar fascia stretch Gastroc stretch towel  Soleus stretch with towel  4 way ankle with red /green therabands- cueing for proper technique Seated bilateral Heel raise - towel rolled  under forefoot Seated Right Heel raise 15# - towel rolled under foot 2x10 Self Care: Management of exs so pt only experiences a reasonable increase in pain Options for orthotics To try hiking boots for arch support to see if they minimize foot pain Modalities: Frozen water  bottle massage to plantar foot x15 mins  [facility] Adult PT Treatment:                                                DATE: 10/20/23 Therapeutic Exercise: Toe scrunches  Toe Yoga  Plantar fascia stretch Gastroc stretch towel  Soleus stretch with towel  4 way ankle with red /green  therabands Seated bilateral Heel raise - towel rolled under forefoot Seated Right Heel raise 15# - towel rolled under foot Manual Therapy  STW plantar fascia   PATIENT EDUCATION:  Education details: Eval findings, POC, HEP, self care  Person educated: Patient Education method: Explanation, Demonstration, Tactile cues, Verbal cues, and Handouts Education comprehension: verbalized understanding, returned demonstration, verbal cues required, and tactile cues required  HOME EXERCISE PROGRAM: Access Code: [email or web address] URL: [email or web address] Date: 10/25/2023 Prepared by: Dasie Daft  Exercises - Gastroc Stretch on Wall (Mirrored)  - 1 x daily - 7 x weekly - 1 sets - 2-3 reps - 60 hold - Long Sitting Calf Stretch with Strap  - 1 x daily - 7 x weekly - 1 sets - 2-3 reps - 60 hold - Standing Heel Raises  - 1 x daily - 7 x weekly - 2 sets - 5 reps - 10 hold - Seated Ankle Eversion with Resistance  - 1 x daily - 7 x weekly - 3 sets - 10 reps - Seated Ankle Inversion with Resistance  - 1 x daily - 7 x weekly - 3 sets - 10 reps - Seated Ankle Plantar Flexion with Resistance Loop  - 1 x daily - 7 x weekly - 3 sets - 10 reps - Long Sitting Plantar Fascia Stretch with Towel  - 1 x daily - 7 x weekly - 1 sets - 3 reps - 30 hold - Toe Yoga - Alternating Great Toe and Lesser Toe Extension  - 1 x daily - 7 x weekly - 1 sets - 10 reps -  Seated Ankle Dorsiflexion with Resistance  - 1 x daily - 7 x weekly - 3 sets - 10 reps - Towel Scrunches  - 1 x daily - 7 x weekly - 1 sets - 1 reps - 30 hold  ASSESSMENT:  CLINICAL IMPRESSION: Continued with closed chain isometric heel raises which she tolerated well. Continued Manual for Plantar fascia pain. At end of session she reported decreased pain. She is interested in [facility].   EVAL: Patient is a 68 y.o. female who was seen today for physical therapy evaluation and treatment for M72.2 (ICD-10-CM) - Plantar fascial fibromatosis. Pt presents with significant R plantar heel pain which is having a marked impact on her functional mobility and QOL. Pt is currently, using a SPC to reduce wt bearing through her R heel. Pt was initiated on a HEP and after completion today reported good pain relief, and she demonstrated an improved gait. Pt will benefit from skilled PT 2w6 to address impairments to optimize function with less pain.   OBJECTIVE IMPAIRMENTS: decreased activity tolerance, difficulty walking, increased edema, and pain.   ACTIVITY LIMITATIONS: carrying, sitting, standing, stairs, and locomotion level  PARTICIPATION LIMITATIONS: meal prep, cleaning, laundry, driving, shopping, community activity, and occupation  PERSONAL FACTORS: Past/current experiences and Time since onset of injury/illness/exacerbation are also affecting patient's functional outcome.   REHAB POTENTIAL: Good  CLINICAL DECISION MAKING: Evolving/moderate complexity  EVALUATION COMPLEXITY: Moderate   GOALS:  SHORT TERM GOALS: Target date: 10/23/23 Pt will be Ind in an initial HEP  Baseline: started Goal status: MET  2.  Pt will report 25% improvement in R plantar heel pain for improved function and quality of life Baseline: 2-10/10 Goal status: NOT MET  LONG TERM GOALS: Target date: 11/18/23  Pt will be Ind in a final HEP to maintain achieved LOF  Baseline:  Goal status: INITIAL  2.  2.  Pt will report  75%  improvement in R plantar heel pain for improved function and quality of life Baseline: 2-10/10 Goal status: INITIAL  3.  Pt will walk with a normalized gait pattern s an AD with increased pace and step length Baseline: c a SPC, antalgic, slow pace Goal status: INITIAL  4.  Pt will be able to initiate a walking program being able to walk 20 mins with no more than a 2 point increase in her R plantar heel pain Baseline:  Goal status: INITIAL  5.  Pt's LEFS score will improve to 50% or greater as indication of improved function  Baseline: 9% Goal status: INITIAL  PLAN:  PT FREQUENCY: 2x/week  PT DURATION: 6 weeks  PLANNED INTERVENTIONS: 97164- PT Re-evaluation, 97110-Therapeutic exercises, 97530- Therapeutic activity, 97112- Neuromuscular re-education, 97535- Self Care, [id number]- Manual therapy, [id number]- Gait training, [phone]- Aquatic Therapy, [phone]- Electrical stimulation (unattended), [phone]- Ionotophoresis 4mg /ml Dexamethasone , 79439 (1-2 muscles), [id number] (3+ muscles)- Dry Needling, Patient/Family education, Balance training, Stair training, Taping, Joint mobilization, Cryotherapy, and Moist heat  PLAN FOR NEXT SESSION: Assess response to HEP; progress therex as indicated; use of modalities, manual therapy; and TPDN as indicated.   Harlene CHRISTELLA Persons PTA 11/08/23 12:03 PM

## 2023-11-09 NOTE — Therapy (Signed)
 OUTPATIENT PHYSICAL THERAPY TREATMENT NOTE   Patient Name: Shelby Grant MRN: [id number] DOB:15-Jan-1956, 68 y.o., female Today's Date: 11/10/2023  END OF SESSION:  PT End of Session - 11/10/23 0942     Visit Number 11    Number of Visits 13    Date for Recertification  11/18/23    Authorization Type [employer]    PT Start Time 0943    PT Stop Time 1015    PT Time Calculation (min) 32 min    Activity Tolerance Patient tolerated treatment well    Behavior During Therapy [facility] for tasks assessed/performed             Past Medical History:  Diagnosis Date   ALLERGIC RHINITIS 07/03/2008   Allergy    seasonal   ANXIETY 06/02/2007   Arthritis    BELL'S PALSY, LEFT 12/02/2008   Cancer (HCC) 01/16/2015   kidney   CELLULITIS, LEFT LEG 09/14/2008   CHEST PAIN 07/07/2009   Complication of anesthesia 08/28/2014   work up in recovery room with jaw locked open   CORNS AND CALLUSES 07/03/2008   Depression    FOOT PAIN 09/22/2009   History of kidney cancer    Hyperlipidemia    IRREGULAR MENSES 06/03/2008   Neoplasm    left renal   OCD (obsessive compulsive disorder)    Renal disorder    Stress fracture of ankle since Jun 29, 2014   left   Supraventricular tachycardia 06/02/2007   Atrial tachycardia with Wenckebach periodicity   Past Surgical History:  Procedure Laterality Date   APPENDECTOMY  20016   BLEPHAROPLASTY Bilateral 10/31/2017   COLONOSCOPY  2009   HIP ARTHROPLASTY Right 11/18/2017   LAPAROSCOPIC APPENDECTOMY N/A 08/28/2014   Procedure: APPENDECTOMY LAPAROSCOPIC;  Surgeon: Lynwood Pina, MD;  Location: [facility] OR;  Service: General;  Laterality: N/A;   ROBOTIC ASSITED PARTIAL NEPHRECTOMY Left 01/06/2015   Procedure: ROBOTIC ASSISTED PARTIAL NEPHRECTOMY;  Surgeon: Gretel Ferrara, MD;  Location: [facility] ORS;  Service: Urology;  Laterality: Left;   WISDOM TOOTH EXTRACTION     Patient Active Problem List   Diagnosis Date Noted   Rash 05/12/2023   Tinnitus of  both ears 12/26/2022   Sensorineural hearing loss, bilateral 12/26/2022   Impacted cerumen of both ears 12/26/2022   Chest pain 06/03/2022   Sprain of right wrist 03/11/2022   Sprain of left ankle 01/21/2022   Acute dysfunction of left eustachian tube 09/30/2021   Left hip pain 07/09/2021   Osteoporosis 09/22/2019   Loss of transverse plantar arch 12/04/2018   Renal cell cancer (HCC) 08/11/2018   Refusal of blood transfusions as patient is Jehovah's Witness 08/11/2018   HLD (hyperlipidemia) 08/11/2018   Encounter for screening for cervical cancer 03/29/2017   Preventative health care 03/29/2017   Colon cancer screening 12/04/2015   Open bite of right thumb without damage to nail 08/04/2015   Menopause syndrome 02/11/2011   Allergic rhinitis 07/03/2008   Supraventricular tachycardia (HCC) 06/02/2007   Generalized anxiety disorder 06/02/2007    PCP: Norleen Lynwood ORN, MD  REFERRING PROVIDER: Aniceto Eva Grebe, PA-C   REFERRING DIAG: M72.2 (ICD-10-CM) - Plantar fascial fibromatosis   THERAPY DIAG:  Pain in right foot  Difficulty in walking, not elsewhere classified  Rationale for Evaluation and Treatment: Rehabilitation  ONSET DATE: April  SUBJECTIVE:   SUBJECTIVE STATEMENT: Pt reports she obtained shoe insoles and recovery shoes and her heel/foot is doing better. Pt has also order a slant board and night splint  for exs at home.  EVAL: Pt is not aware of an overt precipitating incident, but it may have been related to a couple of days of standing on a ladder. Prednisone , icing, voltaren  have not seemed to help. Is currently using a SPC to decrease wt bearing on the R foot. Pt has found shoes with wide high heels have been the most comfortable shoes for her to wear. Even more so than her Hokas. Stretching has also helped some. She does not feel like it is getting better. Pt reports recently spraining the R great toe which is getting better.  PERTINENT HISTORY: See  above  PAIN:  Are you having pain? Yes: NPRS scale: 4/10 Pain location: Medial heel, ankle, plantar foor Pain description: sharp, throb Aggravating factors: Increased time on feet- standing and walking, driving Relieving factors: SPC  PRECAUTIONS: None  RED FLAGS: None   WEIGHT BEARING RESTRICTIONS: No  FALLS:  Has patient fallen in last 6 months? 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 Blain after the the MRI 10/10/23   OBJECTIVE:  Note: Objective measures were completed at Evaluation unless otherwise noted.  DIAGNOSTIC FINDINGS: None for the R heel  10/17/23: MR right ankle 10/10/2023 Impression:  Severe plantar fascitis of the medial cord at the calcaneal attachment, with low grade partial tearing of the medial half of the medial cord origin Mild posterior tib tenosynovitis Pre achilles fat pad edema, suggesting impingement. The achilles tendon is normal Posterior subtalar joint effusion, but without cartilage abnormality or other arthritic changes in this joint.   PATIENT SURVEYS:  LEFS: 7/80= 9%  COGNITION: Overall cognitive status: Within functional limits for tasks assessed     SENSATION: WFL  EDEMA:  R medial heel swelling   POSTURE: neutral arch  PALPATION: TTPto the R medial heel and plantar fascia  LOWER EXTREMITY ROM:  Active ROM Right eval Left eval  Hip flexion    Hip extension    Hip abduction    Hip adduction    Hip internal rotation    Hip external rotation    Knee flexion    Knee extension    Ankle dorsiflexion 12 12  Ankle plantarflexion    Ankle inversion    Ankle  eversion     (Blank rows = not tested)  LOWER EXTREMITY MMT:  WNLs for both LEs MMT Right eval Left eval  Hip flexion    Hip extension    Hip abduction    Hip adduction    Hip internal rotation    Hip external rotation    Knee flexion    Knee extension    Ankle dorsiflexion    Ankle plantarflexion    Ankle inversion    Ankle eversion     (Blank rows = not tested)   GAIT: Distance walked: 200' Assistive device utilized: Single point cane Level of assistance: Modified independence Comments: Antalgic gait pattern  TREATMENT DATE:  [facility] Adult PT Treatment:                                                DATE: 11/10/23 Therapeutic Exercise: Slant  board gastroc and soleus stretch  Iso heel raise 30 sec from slant board x 5 - mid range hold  Manual Therapy: STW plantar fascia  [facility] Adult PT Treatment:                                                DATE: 11/08/23 Therapeutic Exercise: Administrator, sports and soleus stretch  Iso heel raise 30 sec from slant board x 5 - mid range hold  Tandem stance RLE back, on AIREX pad  Plantar fascia stretch with towel  Seated toe scrunches  Toe Yoga  Manual Therapy: STW plantar fascia                                                                                                               [facility] Adult PT Treatment:                                                DATE: 11/03/23 Therapeutic Exercise: Iso heel raise 30sec x 5  Gastroc stretch 30 sec x 2  Soleus stretch 30 sec x 2  Seated toe scrunch 5 sec holds PF stretch with towel Long sitting  4 way ankle GTB x 20 each  Manual Therapy STM PF w/ and w/o GT ext  PATIENT EDUCATION:  Education details: Eval findings, POC, HEP, self care  Person educated: Patient Education method: Explanation, Demonstration, Tactile cues, Verbal cues, and Handouts Education  comprehension: verbalized understanding, returned demonstration, verbal cues required, and tactile cues required  HOME EXERCISE PROGRAM: Access Code: [email or web address] URL: [email or web address] Date: 10/25/2023 Prepared by: Dasie Daft  Exercises - Gastroc Stretch on Wall (Mirrored)  - 1 x daily - 7 x weekly - 1 sets - 2-3 reps - 60 hold - Long Sitting Calf Stretch with Strap  - 1 x daily - 7 x weekly - 1 sets - 2-3 reps - 60 hold - Standing Heel Raises  - 1 x daily - 7 x weekly - 2 sets - 5 reps - 10 hold - Seated Ankle Eversion with Resistance  - 1 x daily - 7 x weekly - 3 sets - 10 reps - Seated Ankle Inversion with Resistance  - 1 x daily - 7 x weekly - 3 sets - 10 reps - Seated Ankle Plantar Flexion with Resistance Loop  - 1 x daily - 7 x weekly - 3 sets -  10 reps - Long Sitting Plantar Fascia Stretch with Towel  - 1 x daily - 7 x weekly - 1 sets - 3 reps - 30 hold - Toe Yoga - Alternating Great Toe and Lesser Toe Extension  - 1 x daily - 7 x weekly - 1 sets - 10 reps - Seated Ankle Dorsiflexion with Resistance  - 1 x daily - 7 x weekly - 3 sets - 10 reps - Towel Scrunches  - 1 x daily - 7 x weekly - 1 sets - 1 reps - 30 hold  ASSESSMENT:  CLINICAL IMPRESSION: Pt arrived late for today's appt. Pt reports obtaining orthotic insoles for her shoes and her R heel/foot is feeling some better. STW was f/b slant board stretching and closed chain isometric heel raises. Pt will continue to benefit from skilled PT to address impairments for improved function with minimized pain.  EVAL: Patient is a 68 y.o. female who was seen today for physical therapy evaluation and treatment for M72.2 (ICD-10-CM) - Plantar fascial fibromatosis. Pt presents with significant R plantar heel pain which is having a marked impact on her functional mobility and QOL. Pt is currently, using a SPC to reduce wt bearing through her R heel. Pt was initiated on a HEP and after completion today reported good pain  relief, and she demonstrated an improved gait. Pt will benefit from skilled PT 2w6 to address impairments to optimize function with less pain.   OBJECTIVE IMPAIRMENTS: decreased activity tolerance, difficulty walking, increased edema, and pain.   ACTIVITY LIMITATIONS: carrying, sitting, standing, stairs, and locomotion level  PARTICIPATION LIMITATIONS: meal prep, cleaning, laundry, driving, shopping, community activity, and occupation  PERSONAL FACTORS: Past/current experiences and Time since onset of injury/illness/exacerbation are also affecting patient's functional outcome.   REHAB POTENTIAL: Good  CLINICAL DECISION MAKING: Evolving/moderate complexity  EVALUATION COMPLEXITY: Moderate   GOALS:  SHORT TERM GOALS: Target date: 10/23/23 Pt will be Ind in an initial HEP  Baseline: started Goal status: MET  2.  Pt will report 25% improvement in R plantar heel pain for improved function and quality of life Baseline: 2-10/10 Goal status: NOT MET  LONG TERM GOALS: Target date: 11/18/23  Pt will be Ind in a final HEP to maintain achieved LOF  Baseline:  Goal status: INITIAL  2.  2.  Pt will report 75% improvement in R plantar heel pain for improved function and quality of life Baseline: 2-10/10 Goal status: INITIAL  3.  Pt will walk with a normalized gait pattern s an AD with increased pace and step length Baseline: c a SPC, antalgic, slow pace Goal status: INITIAL  4.  Pt will be able to initiate a walking program being able to walk 20 mins with no more than a 2 point increase in her R plantar heel pain Baseline:  Goal status: INITIAL  5.  Pt's LEFS score will improve to 50% or greater as indication of improved function  Baseline: 9% Goal status: INITIAL  PLAN:  PT FREQUENCY: 2x/week  PT DURATION: 6 weeks  PLANNED INTERVENTIONS: 97164- PT Re-evaluation, 97110-Therapeutic exercises, 97530- Therapeutic activity, 97112- Neuromuscular re-education, 97535- Self Care,  97140- Manual therapy, [id number]- Gait training, 02886- Aquatic Therapy, [phone]- Electrical stimulation (unattended), [phone]- Ionotophoresis 4mg /ml Dexamethasone , 79439 (1-2 muscles), 20561 (3+ muscles)- Dry Needling, Patient/Family education, Balance training, Stair training, Taping, Joint mobilization, Cryotherapy, and Moist heat  PLAN FOR NEXT SESSION: Assess response to HEP; progress therex as indicated; use of modalities, manual therapy; and TPDN as indicated.  Julee Stoll MS, PT 11/10/23 1:38 PM

## 2023-11-10 ENCOUNTER — Ambulatory Visit

## 2023-11-10 DIAGNOSIS — M79671 Pain in right foot: Secondary | ICD-10-CM

## 2023-11-10 DIAGNOSIS — R262 Difficulty in walking, not elsewhere classified: Secondary | ICD-10-CM

## 2023-11-15 ENCOUNTER — Ambulatory Visit

## 2023-11-15 DIAGNOSIS — R262 Difficulty in walking, not elsewhere classified: Secondary | ICD-10-CM

## 2023-11-15 DIAGNOSIS — M79671 Pain in right foot: Secondary | ICD-10-CM | POA: Diagnosis not present

## 2023-11-15 NOTE — Therapy (Signed)
 OUTPATIENT PHYSICAL THERAPY TREATMENT NOTE   Patient Name: Shelby Grant MRN: [id number] DOB:1955/02/15, 68 y.o., female Today's Date: 11/15/2023  END OF SESSION:  PT End of Session - 11/15/23 0920     Visit Number 12    Number of Visits 13    Date for Recertification  11/18/23    Authorization Type [employer]    PT Start Time 0851    PT Stop Time 0930    PT Time Calculation (min) 39 min    Activity Tolerance Patient tolerated treatment well    Behavior During Therapy [facility] for tasks assessed/performed              Past Medical History:  Diagnosis Date   ALLERGIC RHINITIS 07/03/2008   Allergy    seasonal   ANXIETY 06/02/2007   Arthritis    BELL'S PALSY, LEFT 12/02/2008   Cancer (HCC) 01/16/2015   kidney   CELLULITIS, LEFT LEG 09/14/2008   CHEST PAIN 07/07/2009   Complication of anesthesia 08/28/2014   work up in recovery room with jaw locked open   CORNS AND CALLUSES 07/03/2008   Depression    FOOT PAIN 09/22/2009   History of kidney cancer    Hyperlipidemia    IRREGULAR MENSES 06/03/2008   Neoplasm    left renal   OCD (obsessive compulsive disorder)    Renal disorder    Stress fracture of ankle since Jun 29, 2014   left   Supraventricular tachycardia 06/02/2007   Atrial tachycardia with Wenckebach periodicity   Past Surgical History:  Procedure Laterality Date   APPENDECTOMY  20016   BLEPHAROPLASTY Bilateral 10/31/2017   COLONOSCOPY  2009   HIP ARTHROPLASTY Right 11/18/2017   LAPAROSCOPIC APPENDECTOMY N/A 08/28/2014   Procedure: APPENDECTOMY LAPAROSCOPIC;  Surgeon: Lynwood Pina, MD;  Location: [facility] OR;  Service: General;  Laterality: N/A;   ROBOTIC ASSITED PARTIAL NEPHRECTOMY Left 01/06/2015   Procedure: ROBOTIC ASSISTED PARTIAL NEPHRECTOMY;  Surgeon: Gretel Ferrara, MD;  Location: [facility] ORS;  Service: Urology;  Laterality: Left;   WISDOM TOOTH EXTRACTION     Patient Active Problem List   Diagnosis Date Noted   Rash 05/12/2023   Tinnitus  of both ears 12/26/2022   Sensorineural hearing loss, bilateral 12/26/2022   Impacted cerumen of both ears 12/26/2022   Chest pain 06/03/2022   Sprain of right wrist 03/11/2022   Sprain of left ankle 01/21/2022   Acute dysfunction of left eustachian tube 09/30/2021   Left hip pain 07/09/2021   Osteoporosis 09/22/2019   Loss of transverse plantar arch 12/04/2018   Renal cell cancer (HCC) 08/11/2018   Refusal of blood transfusions as patient is Jehovah's Witness 08/11/2018   HLD (hyperlipidemia) 08/11/2018   Encounter for screening for cervical cancer 03/29/2017   Preventative health care 03/29/2017   Colon cancer screening 12/04/2015   Open bite of right thumb without damage to nail 08/04/2015   Menopause syndrome 02/11/2011   Allergic rhinitis 07/03/2008   Supraventricular tachycardia (HCC) 06/02/2007   Generalized anxiety disorder 06/02/2007    PCP: Norleen Lynwood ORN, MD  REFERRING PROVIDER: Aniceto Eva Grebe, PA-C   REFERRING DIAG: M72.2 (ICD-10-CM) - Plantar fascial fibromatosis   THERAPY DIAG:  Pain in right foot  Difficulty in walking, not elsewhere classified  Rationale for Evaluation and Treatment: Rehabilitation  ONSET DATE: April  SUBJECTIVE:   SUBJECTIVE STATEMENT: Pt reports overall some improvement, but has issues with increased pain with certain movements.  EVAL: Pt is not aware of an overt precipitating  incident, but it may have been related to a couple of days of standing on a ladder. Prednisone , icing, voltaren  have not seemed to help. Is currently using a SPC to decrease wt bearing on the R foot. Pt has found shoes with wide high heels have been the most comfortable shoes for her to wear. Even more so than her Hokas. Stretching has also helped some. She does not feel like it is getting better. Pt reports recently spraining the R great toe which is getting better.  PERTINENT HISTORY: See above  PAIN:  Are you having pain? Yes: NPRS scale: 4/10 Pain  location: Medial heel, ankle, plantar foor Pain description: sharp, throb Aggravating factors: Increased time on feet- standing and walking, driving Relieving factors: SPC  PRECAUTIONS: None  RED FLAGS: None   WEIGHT BEARING RESTRICTIONS: No  FALLS:  Has patient fallen in last 6 months? 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 Blain after the the MRI 10/10/23   OBJECTIVE:  Note: Objective measures were completed at Evaluation unless otherwise noted.  DIAGNOSTIC FINDINGS: None for the R heel  10/17/23: MR right ankle 10/10/2023 Impression:  Severe plantar fascitis of the medial cord at the calcaneal attachment, with low grade partial tearing of the medial half of the medial cord origin Mild posterior tib tenosynovitis Pre achilles fat pad edema, suggesting impingement. The achilles tendon is normal Posterior subtalar joint effusion, but without cartilage abnormality or other arthritic changes in this joint.   PATIENT SURVEYS:  LEFS: 7/80= 9%  COGNITION: Overall cognitive status: Within functional limits for tasks assessed     SENSATION: WFL  EDEMA:  R medial heel swelling   POSTURE: neutral arch  PALPATION: TTPto the R medial heel and plantar fascia  LOWER EXTREMITY ROM:  Active ROM Right eval Left eval  Hip flexion    Hip extension    Hip abduction    Hip adduction    Hip internal rotation    Hip external rotation    Knee flexion    Knee extension    Ankle dorsiflexion 12 12  Ankle plantarflexion    Ankle inversion    Ankle eversion     (Blank rows = not tested)  LOWER EXTREMITY MMT:  WNLs  for both LEs MMT Right eval Left eval  Hip flexion    Hip extension    Hip abduction    Hip adduction    Hip internal rotation    Hip external rotation    Knee flexion    Knee extension    Ankle dorsiflexion    Ankle plantarflexion    Ankle inversion    Ankle eversion     (Blank rows = not tested)   GAIT: Distance walked: 200' Assistive device utilized: Single point cane Level of assistance: Modified independence Comments: Antalgic gait pattern  TREATMENT DATE:  [facility] Adult PT Treatment:                                                DATE: 11/15/23 Therapeutic Exercise: Plantar fascia stretch with pillow case Seated toe scrunches  Toe Yoga  Pillow case stretch Tennis ball massage Slant  board gastroc and soleus stretch  Iso heel raise 30 sec from slant board x 5 - mid range hold  SL and Tandem stance RLE back, on AIREX pad  Manual Therapy: STW plantar fascia  [facility] Adult PT Treatment:                                                DATE: 11/10/23 Therapeutic Exercise: Slant  board gastroc and soleus stretch  Iso heel raise 30 sec from slant board x 5 - mid range hold  Manual Therapy: STW plantar fascia  [facility] Adult PT Treatment:                                                DATE: 11/08/23 Therapeutic Exercise: Administrator, sports and soleus stretch  Iso heel raise 30 sec from slant board x 5 - mid range hold  Tandem stance RLE back, on AIREX pad  Plantar fascia stretch with towel  Seated toe scrunches  Toe Yoga  Manual Therapy: STW plantar fascia                                                                                                               [facility] Adult PT Treatment:                                                DATE: 11/03/23 Therapeutic Exercise: Iso heel raise 30sec x 5  Gastroc stretch 30 sec x 2  Soleus stretch 30 sec  x 2  Seated toe scrunch 5 sec holds PF stretch with towel Long sitting  4 way ankle GTB x 20 each  Manual Therapy STM PF w/ and w/o GT ext  PATIENT EDUCATION:  Education details: Eval findings, POC, HEP, self care  Person educated: Patient Education method: Explanation, Demonstration, Tactile cues, Verbal cues, and Handouts Education comprehension: verbalized understanding, returned demonstration, verbal cues required, and tactile cues required  HOME EXERCISE PROGRAM: Access Code: [email or web address] URL: [email or web address] Date: 10/25/2023 Prepared by: Dasie Daft  Exercises - Gastroc Stretch on Wall (Mirrored)  - 1 x daily - 7 x weekly - 1 sets - 2-3 reps -  60 hold - Long Sitting Calf Stretch with Strap  - 1 x daily - 7 x weekly - 1 sets - 2-3 reps - 60 hold - Standing Heel Raises  - 1 x daily - 7 x weekly - 2 sets - 5 reps - 10 hold - Seated Ankle Eversion with Resistance  - 1 x daily - 7 x weekly - 3 sets - 10 reps - Seated Ankle Inversion with Resistance  - 1 x daily - 7 x weekly - 3 sets - 10 reps - Seated Ankle Plantar Flexion with Resistance Loop  - 1 x daily - 7 x weekly - 3 sets - 10 reps - Long Sitting Plantar Fascia Stretch with Towel  - 1 x daily - 7 x weekly - 1 sets - 3 reps - 30 hold - Toe Yoga - Alternating Great Toe and Lesser Toe Extension  - 1 x daily - 7 x weekly - 1 sets - 10 reps - Seated Ankle Dorsiflexion with Resistance  - 1 x daily - 7 x weekly - 3 sets - 10 reps - Towel Scrunches  - 1 x daily - 7 x weekly - 1 sets - 1 reps - 30 hold  ASSESSMENT:  CLINICAL IMPRESSION: Continued Pt as before c STM, stretching, and strengthening. Added tennis ball massage to pt's home program. Overall pt is experiencing gradual progress with pain and function. Pt was 1 more scheduled PT appt, will assess for DC vs continuation of care.  EVAL: Patient is a 68 y.o. female who was seen today for physical therapy evaluation and treatment for M72.2 (ICD-10-CM) - Plantar  fascial fibromatosis. Pt presents with significant R plantar heel pain which is having a marked impact on her functional mobility and QOL. Pt is currently, using a SPC to reduce wt bearing through her R heel. Pt was initiated on a HEP and after completion today reported good pain relief, and she demonstrated an improved gait. Pt will benefit from skilled PT 2w6 to address impairments to optimize function with less pain.   OBJECTIVE IMPAIRMENTS: decreased activity tolerance, difficulty walking, increased edema, and pain.   ACTIVITY LIMITATIONS: carrying, sitting, standing, stairs, and locomotion level  PARTICIPATION LIMITATIONS: meal prep, cleaning, laundry, driving, shopping, community activity, and occupation  PERSONAL FACTORS: Past/current experiences and Time since onset of injury/illness/exacerbation are also affecting patient's functional outcome.   REHAB POTENTIAL: Good  CLINICAL DECISION MAKING: Evolving/moderate complexity  EVALUATION COMPLEXITY: Moderate   GOALS:  SHORT TERM GOALS: Target date: 10/23/23 Pt will be Ind in an initial HEP  Baseline: started Goal status: MET  2.  Pt will report 25% improvement in R plantar heel pain for improved function and quality of life Baseline: 2-10/10 Goal status: NOT MET  LONG TERM GOALS: Target date: 11/18/23  Pt will be Ind in a final HEP to maintain achieved LOF  Baseline:  Goal status: INITIAL  2.  2.  Pt will report 75% improvement in R plantar heel pain for improved function and quality of life Baseline: 2-10/10 Goal status: INITIAL  3.  Pt will walk with a normalized gait pattern s an AD with increased pace and step length Baseline: c a SPC, antalgic, slow pace Goal status: INITIAL  4.  Pt will be able to initiate a walking program being able to walk 20 mins with no more than a 2 point increase in her R plantar heel pain Baseline:  Goal status: INITIAL  5.  Pt's LEFS score will improve to 50%  or greater as indication  of improved function  Baseline: 9% Goal status: INITIAL  PLAN:  PT FREQUENCY: 2x/week  PT DURATION: 6 weeks  PLANNED INTERVENTIONS: 97164- PT Re-evaluation, 97110-Therapeutic exercises, 97530- Therapeutic activity, 97112- Neuromuscular re-education, 97535- Self Care, 02859- Manual therapy, [phone]- Gait training, [phone]- Aquatic Therapy, [phone]- Electrical stimulation (unattended), [phone]- Ionotophoresis 4mg /ml Dexamethasone , 79439 (1-2 muscles), 20561 (3+ muscles)- Dry Needling, Patient/Family education, Balance training, Stair training, Taping, Joint mobilization, Cryotherapy, and Moist heat  PLAN FOR NEXT SESSION: Assess response to HEP; progress therex as indicated; use of modalities, manual therapy; and TPDN as indicated.   Shanon Seawright MS, PT 11/15/23 9:42 AM

## 2023-11-16 NOTE — Therapy (Unsigned)
 OUTPATIENT PHYSICAL THERAPY TREATMENT NOTE/Discharge   Patient Name: Shelby Grant MRN: [id number] DOB:[birth year], 68 y.o., female Today's Date: 11/17/2023  END OF SESSION:  PT End of Session - 11/17/23 1320     Visit Number 13    Number of Visits 14    Date for Recertification  11/18/23    Authorization Type [employer]    PT Start Time 0935    PT Stop Time 1018    PT Time Calculation (min) 43 min    Activity Tolerance Patient tolerated treatment well    Behavior During Therapy [facility] for tasks assessed/performed               Past Medical History:  Diagnosis Date   ALLERGIC RHINITIS 07/03/2008   Allergy    seasonal   ANXIETY 06/02/2007   Arthritis    BELL'S PALSY, LEFT 12/02/2008   Cancer (HCC) 01/16/2015   kidney   CELLULITIS, LEFT LEG 09/14/2008   CHEST PAIN 07/07/2009   Complication of anesthesia 08/28/2014   work up in recovery room with jaw locked open   CORNS AND CALLUSES 07/03/2008   Depression    FOOT PAIN 09/22/2009   History of kidney cancer    Hyperlipidemia    IRREGULAR MENSES 06/03/2008   Neoplasm    left renal   OCD (obsessive compulsive disorder)    Renal disorder    Stress fracture of ankle since Jun 29, 2014   left   Supraventricular tachycardia 06/02/2007   Atrial tachycardia with Wenckebach periodicity   Past Surgical History:  Procedure Laterality Date   APPENDECTOMY  20016   BLEPHAROPLASTY Bilateral 10/31/2017   COLONOSCOPY  2009   HIP ARTHROPLASTY Right 11/18/2017   LAPAROSCOPIC APPENDECTOMY N/A 08/28/2014   Procedure: APPENDECTOMY LAPAROSCOPIC;  Surgeon: Lynwood Pina, MD;  Location: [facility] OR;  Service: General;  Laterality: N/A;   ROBOTIC ASSITED PARTIAL NEPHRECTOMY Left 01/06/2015   Procedure: ROBOTIC ASSISTED PARTIAL NEPHRECTOMY;  Surgeon: Gretel Ferrara, MD;  Location: [facility] ORS;  Service: Urology;  Laterality: Left;   WISDOM TOOTH EXTRACTION     Patient Active Problem List   Diagnosis Date Noted   Rash 05/12/2023    Tinnitus of both ears 12/26/2022   Sensorineural hearing loss, bilateral 12/26/2022   Impacted cerumen of both ears 12/26/2022   Chest pain 06/03/2022   Sprain of right wrist 03/11/2022   Sprain of left ankle 01/21/2022   Acute dysfunction of left eustachian tube 09/30/2021   Left hip pain 07/09/2021   Osteoporosis 09/22/2019   Loss of transverse plantar arch 12/04/2018   Renal cell cancer (HCC) 08/11/2018   Refusal of blood transfusions as patient is Jehovah's Witness 08/11/2018   HLD (hyperlipidemia) 08/11/2018   Encounter for screening for cervical cancer 03/29/2017   Preventative health care 03/29/2017   Colon cancer screening 12/04/2015   Open bite of right thumb without damage to nail 08/04/2015   Menopause syndrome 02/11/2011   Allergic rhinitis 07/03/2008   Supraventricular tachycardia (HCC) 06/02/2007   Generalized anxiety disorder 06/02/2007    PCP: Norleen Lynwood ORN, MD  REFERRING PROVIDER: Aniceto Eva Grebe, PA-C   REFERRING DIAG: M72.2 (ICD-10-CM) - Plantar fascial fibromatosis   THERAPY DIAG:  Pain in right foot  Difficulty in walking, not elsewhere classified  Rationale for Evaluation and Treatment: Rehabilitation  ONSET DATE: April  SUBJECTIVE:   SUBJECTIVE STATEMENT: Increased pain today after moving and negotiating steps, but overall 50% improvement since the start of PT.  EVAL: Pt is not aware  of an overt precipitating incident, but it may have been related to a couple of days of standing on a ladder. Prednisone , icing, voltaren  have not seemed to help. Is currently using a SPC to decrease wt bearing on the R foot. Pt has found shoes with wide high heels have been the most comfortable shoes for her to wear. Even more so than her Hokas. Stretching has also helped some. She does not feel like it is getting better. Pt reports recently spraining the R great toe which is getting better.  PERTINENT HISTORY: See above  PAIN:  Are you having pain? Yes:  NPRS scale: 5/10 Pain location: Medial heel, ankle, plantar foor Pain description: sharp, throb Aggravating factors: Increased time on feet- standing and walking, driving Relieving factors: SPC  PRECAUTIONS: None  RED FLAGS: None   WEIGHT BEARING RESTRICTIONS: No  FALLS:  Has patient fallen in last 6 months? 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 Blain after the the MRI 10/10/23   OBJECTIVE:  Note: Objective measures were completed at Evaluation unless otherwise noted.  DIAGNOSTIC FINDINGS: None for the R heel  10/17/23: MR right ankle 10/10/2023 Impression:  Severe plantar fascitis of the medial cord at the calcaneal attachment, with low grade partial tearing of the medial half of the medial cord origin Mild posterior tib tenosynovitis Pre achilles fat pad edema, suggesting impingement. The achilles tendon is normal Posterior subtalar joint effusion, but without cartilage abnormality or other arthritic changes in this joint.   PATIENT SURVEYS:  LEFS: 7/80= 9%  COGNITION: Overall cognitive status: Within functional limits for tasks assessed     SENSATION: WFL  EDEMA:  R medial heel swelling   POSTURE: neutral arch  PALPATION: TTPto the R medial heel and plantar fascia  LOWER EXTREMITY ROM:  Active ROM Right eval Left eval  Hip flexion    Hip extension    Hip abduction    Hip adduction    Hip internal rotation    Hip external rotation    Knee flexion    Knee extension    Ankle dorsiflexion 12 12  Ankle plantarflexion    Ankle inversion    Ankle eversion     (Blank rows = not tested)  LOWER  EXTREMITY MMT:  WNLs for both LEs MMT Right eval Left eval  Hip flexion    Hip extension    Hip abduction    Hip adduction    Hip internal rotation    Hip external rotation    Knee flexion    Knee extension    Ankle dorsiflexion    Ankle plantarflexion    Ankle inversion    Ankle eversion     (Blank rows = not tested)   GAIT: Distance walked: 200' Assistive device utilized: Single point cane Level of assistance: Modified independence Comments: Antalgic gait pattern  TREATMENT DATE:  [facility] Adult PT Treatment:                                                DATE: 11/17/23 Therapeutic Exercise: Plantar fascia stretch with pillow case Seated toe scrunches  Toe Yoga  Frozen bottle roll and massage Seated Heel raise 10 sec x10 10#  SL and Tandem stance RLE back, on AIREX pad  Self Care: Review of management re: load, management, stretching, massage, cold therapy  [facility] Adult PT Treatment:                                                DATE: 11/15/23 Therapeutic Exercise: Plantar fascia stretch with pillow case Seated toe scrunches  Toe Yoga  Pillow case stretch Tennis ball massage Slant  board gastroc and soleus stretch  Iso heel raise 30 sec from slant board x 5 - mid range hold  SL and Tandem stance RLE back, on AIREX pad  Manual Therapy: STW plantar fascia                                                                                                        PATIENT EDUCATION:  Education details: Eval findings, POC, HEP, self care  Person educated: Patient Education method: Explanation, Demonstration, Tactile cues, Verbal cues, and Handouts Education comprehension: verbalized understanding, returned demonstration, verbal cues required, and tactile cues required  HOME EXERCISE PROGRAM: Access Code: [email or web address] URL: [email or web address] Date:  11/17/2023 Prepared by: Dasie Daft  Exercises - Gastroc Stretch on Wall (Mirrored)  - 1 x daily - 7 x weekly - 1 sets - 2-3 reps - 60 hold - Long Sitting Plantar Fascia Stretch with Towel  - 1 x daily - 7 x weekly - 1 sets - 3 reps - 30 hold - Toe Yoga - Alternating Great Toe and Lesser Toe Extension  - 1 x daily - 7 x weekly - 1 sets - 10 reps - Towel Scrunches  - 1 x daily - 7 x weekly - 1 sets - 1 reps - 30 hold - Seated Ankle Eversion with Resistance  - 1 x daily - 7 x weekly - 3 sets - 10 reps - Seated Ankle Inversion with Resistance  - 1 x daily - 7 x weekly - 3 sets - 10 reps - Seated Ankle Plantar Flexion with Resistance Loop  - 1 x daily - 7 x weekly - 3 sets - 10 reps - Seated Ankle Dorsiflexion with Resistance  - 1 x daily - 7 x weekly - 3 sets - 10 reps - Seated Heel Raise  - 1 x daily - 7 x weekly - 2 sets - 10 reps - 10 hold - Standing Heel Raises  - 1 x daily - 7 x weekly - 2 sets -  5 reps - 10 hold  ASSESSMENT:  CLINICAL IMPRESSION: Pt completed her course of PT today. Pt presents today with increased R foot/heel pain after moving items out of her old home to the new which required frequent negotiation of steps. Overall though, pt estimates 50% improvement of her R heel pain. PT was completed today for flexibility and strengthening at a decrease level for proper load management. Self care was provided as above to assist pt with continuation of the rehab process at home. Pt is in agreement with DC from PT services at this time.  EVAL: Patient is a 68 y.o. female who was seen today for physical therapy evaluation and treatment for M72.2 (ICD-10-CM) - Plantar fascial fibromatosis. Pt presents with significant R plantar heel pain which is having a marked impact on her functional mobility and QOL. Pt is currently, using a SPC to reduce wt bearing through her R heel. Pt was initiated on a HEP and after completion today reported good pain relief, and she demonstrated an improved gait.  Pt will benefit from skilled PT 2w6 to address impairments to optimize function with less pain.   OBJECTIVE IMPAIRMENTS: decreased activity tolerance, difficulty walking, increased edema, and pain.   ACTIVITY LIMITATIONS: carrying, sitting, standing, stairs, and locomotion level  PARTICIPATION LIMITATIONS: meal prep, cleaning, laundry, driving, shopping, community activity, and occupation  PERSONAL FACTORS: Past/current experiences and Time since onset of injury/illness/exacerbation are also affecting patient's functional outcome.   REHAB POTENTIAL: Good  CLINICAL DECISION MAKING: Evolving/moderate complexity  EVALUATION COMPLEXITY: Moderate   GOALS:  SHORT TERM GOALS: Target date: 10/23/23 Pt will be Ind in an initial HEP  Baseline: started Goal status: MET  2.  Pt will report 25% improvement in R plantar heel pain for improved function and quality of life Baseline: 2-10/10 Goal status: NOT MET  LONG TERM GOALS: Target date: 11/18/23  Pt will be Ind in a final HEP to maintain achieved LOF  Baseline:  Goal status: MET  2.  2.  Pt will report 75% improvement in R plantar heel pain for improved function and quality of life Baseline: 2-10/10 11/17/23:50%  Goal status: IMPROVED, PARTIALLY MET  3.  Pt will walk with a normalized gait pattern s an AD with increased pace and step length Baseline: c a SPC, antalgic, slow pace 11/17/23: Overall improved, depends on pain level Goal status: IMPROVED, PARTIALLY MET  4.  Pt will be able to initiate a walking program being able to walk 20 mins with no more than a 2 point increase in her R plantar heel pain Baseline:  11/17/23: Has not started a walking program due to personal schedule conflict Goal status: NOT MET  5.  Pt's LEFS score will improve to 50% or greater as indication of improved function  Baseline: 9% 11/17/23: 43% Goal status: IMPROVED, PARTIALLY MET  PLAN:  PT FREQUENCY: 2x/week  PT DURATION: 6  weeks  PLANNED INTERVENTIONS: 97164- PT Re-evaluation, 97110-Therapeutic exercises, 97530- Therapeutic activity, 97112- Neuromuscular re-education, 97535- Self Care, 02859- Manual therapy, [id number]- Gait training, [phone]- Aquatic Therapy, [phone]- Electrical stimulation (unattended), [phone]- Ionotophoresis 4mg /ml Dexamethasone , 79439 (1-2 muscles), 20561 (3+ muscles)- Dry Needling, Patient/Family education, Balance training, Stair training, Taping, Joint mobilization, Cryotherapy, and Moist heat  PLAN FOR NEXT SESSION: Assess response to HEP; progress therex as indicated; use of modalities, manual therapy; and TPDN as indicated.   PHYSICAL THERAPY DISCHARGE SUMMARY  Visits from Start of Care: 13  Current functional level related to goals / functional outcomes: See clinical  impression and PT goals   Remaining deficits: See clinical impression and PT goals    Education / Equipment: HEP; Pt Ed   Patient agrees to discharge. Patient goals were partially met. Patient is being discharged due to being pleased with the current functional level.   Kylyn Sookram MS, PT 11/17/23 2:04 PM

## 2023-11-17 ENCOUNTER — Ambulatory Visit

## 2023-11-17 DIAGNOSIS — M79671 Pain in right foot: Secondary | ICD-10-CM

## 2023-11-17 DIAGNOSIS — R262 Difficulty in walking, not elsewhere classified: Secondary | ICD-10-CM

## 2023-12-06 ENCOUNTER — Other Ambulatory Visit: Payer: Self-pay

## 2023-12-06 ENCOUNTER — Encounter: Payer: Self-pay | Admitting: Internal Medicine

## 2023-12-06 MED ORDER — ALENDRONATE SODIUM 70 MG PO TABS: 70.0000 mg | ORAL_TABLET | ORAL | 3 refills | Status: AC

## 2023-12-12 DIAGNOSIS — M722 Plantar fascial fibromatosis: Secondary | ICD-10-CM | POA: Diagnosis not present

## 2023-12-23 ENCOUNTER — Ambulatory Visit (INDEPENDENT_AMBULATORY_CARE_PROVIDER_SITE_OTHER): Payer: PPO | Admitting: Otolaryngology

## 2024-01-08 ENCOUNTER — Other Ambulatory Visit: Payer: Self-pay | Admitting: Internal Medicine

## 2024-02-09 ENCOUNTER — Ambulatory Visit (INDEPENDENT_AMBULATORY_CARE_PROVIDER_SITE_OTHER): Admitting: Otolaryngology

## 2024-02-09 ENCOUNTER — Encounter (INDEPENDENT_AMBULATORY_CARE_PROVIDER_SITE_OTHER): Payer: Self-pay | Admitting: Otolaryngology

## 2024-02-09 VITALS — BP 100/62 | HR 90 | Ht 66.5 in | Wt 133.0 lb

## 2024-02-09 DIAGNOSIS — H903 Sensorineural hearing loss, bilateral: Secondary | ICD-10-CM | POA: Diagnosis not present

## 2024-02-09 DIAGNOSIS — H9313 Tinnitus, bilateral: Secondary | ICD-10-CM | POA: Diagnosis not present

## 2024-02-09 DIAGNOSIS — H6983 Other specified disorders of Eustachian tube, bilateral: Secondary | ICD-10-CM | POA: Insufficient documentation

## 2024-02-09 DIAGNOSIS — H6123 Impacted cerumen, bilateral: Secondary | ICD-10-CM | POA: Diagnosis not present

## 2024-02-09 NOTE — Progress Notes (Signed)
 Patient ID: Shelby Grant, female   DOB: 27-Oct-1955, 69 y.o.   MRN: [id number]  Follow up: Bilateral tinnitus, hearing loss, eustachian tube dysfunction   History of Present Illness Shelby Grant is a 69 year old female with bilateral sensorineural hearing loss and tinnitus who presents for routine otolaryngology follow-up.  She reports persistent bilateral tinnitus, unchanged over the past year. She uses hearing aids regularly, describing her benefit as modest but sufficient for daily activities. She has not experienced any new issues with her hearing aids and has not required additional interventions since her last visit.  She has a history of bilateral eustachian tube dysfunction with prior symptoms of aural fullness, but currently denies any significant eustachian tube symptoms. She is not using intranasal corticosteroids at this time, though she has Flonase  available if needed. She notes increased nasal drainage, which she attributes to [place]  Exam: General: Communicates without difficulty, well nourished, no acute distress. Head: Normocephalic, no evidence injury, no tenderness, facial buttresses intact without stepoff. Face/sinus: No tenderness to palpation and percussion. Facial movement is normal and symmetric. Eyes: PERRL, EOMI. No scleral icterus, conjunctivae clear. Neuro: CN II exam reveals vision grossly intact.  No nystagmus at any point of gaze. Ears: Auricles well formed without lesions.  Bilateral cerumen impaction.  Nose: External evaluation reveals normal support and skin without lesions.  Dorsum is intact.  Anterior rhinoscopy reveals congested mucosa over anterior aspect of inferior turbinates and intact septum.  No purulence noted. Oral:  Oral cavity and oropharynx are intact, symmetric, without erythema or edema.  Mucosa is moist without lesions. Neck: Full range of motion without pain.  There is no significant lymphadenopathy.   No masses palpable.  Thyroid  bed within normal limits to palpation.  Parotid glands and submandibular glands equal bilaterally without mass.  Trachea is midline. Neuro:  CN 2-12 grossly intact.   Procedure: Bilateral cerumen disimpaction Anesthesia: None Description: Under the operating microscope, the cerumen is carefully removed with a combination of cerumen currette, alligator forceps, and suction catheters.  After the cerumen is removed, the TMs are noted to be normal.  No mass, erythema, or lesions. The patient tolerated the procedure well.   Assessment & Plan Bilateral cerumen impaction Cerumen accumulation was present bilaterally. Ear canals and tympanic membranes appeared healthy following removal, with no residual impaction. - Removed cerumen from both ears during the visit. - Advised annual follow-up unless new otologic symptoms develop. - Provided anticipatory guidance regarding hearing aid maintenance and risk of cerumen occluding devices.  Bilateral high-frequency sensorineural hearing loss with subjective tinnitus She experiences persistent bilateral high-frequency sensorineural hearing loss and tinnitus. Hearing aids provide partial benefit. - Advised continued use of hearing aids for management of hearing loss and tinnitus. - Recommended annual audiologic follow-up unless new symptoms develop.  Eustachian tube dysfunction No current symptoms of eustachian tube dysfunction. Increased nasal drainage may be attributable to environmental changes after relocation to an [place] area. - Recommended resuming Flonase  nasal spray if nasal drainage persists or worsens, particularly during periods of increased pollen or dust exposure. - Provided education regarding environmental contributors to symptoms. - Advised annual follow-up unless new symptoms arise.

## 2024-10-11 ENCOUNTER — Ambulatory Visit

## 2024-10-11 ENCOUNTER — Encounter: Admitting: Internal Medicine
